# Patient Record
Sex: Female | Born: 1955 | Race: White | Hispanic: No | Marital: Married | Smoking: Never smoker
Health system: Southern US, Community
[De-identification: ages and names within clinical notes are randomized; demographics above are authoritative.]

## PROBLEM LIST (undated history)

## (undated) DIAGNOSIS — J101 Influenza due to other identified influenza virus with other respiratory manifestations: Secondary | ICD-10-CM

## (undated) DIAGNOSIS — F338 Other recurrent depressive disorders: Secondary | ICD-10-CM

## (undated) DIAGNOSIS — I10 Essential (primary) hypertension: Secondary | ICD-10-CM

## (undated) DIAGNOSIS — I82409 Acute embolism and thrombosis of unspecified deep veins of unspecified lower extremity: Secondary | ICD-10-CM

## (undated) HISTORY — PX: KNEE ARTHROSCOPY: SUR90

---

## 1978-08-06 HISTORY — PX: CYST EXCISION: SHX5701

## 1978-08-06 HISTORY — PX: TONSILLECTOMY: SUR1361

## 1998-11-18 ENCOUNTER — Ambulatory Visit (HOSPITAL_BASED_OUTPATIENT_CLINIC_OR_DEPARTMENT_OTHER): Admission: RE | Admit: 1998-11-18 | Discharge: 1998-11-18 | Payer: Self-pay | Admitting: General Surgery

## 2001-09-30 ENCOUNTER — Other Ambulatory Visit: Admission: RE | Admit: 2001-09-30 | Discharge: 2001-09-30 | Payer: Self-pay | Admitting: Obstetrics and Gynecology

## 2001-10-13 ENCOUNTER — Encounter: Admission: RE | Admit: 2001-10-13 | Discharge: 2001-10-13 | Payer: Self-pay | Admitting: Obstetrics and Gynecology

## 2001-10-13 ENCOUNTER — Encounter: Payer: Self-pay | Admitting: Obstetrics and Gynecology

## 2003-09-22 ENCOUNTER — Other Ambulatory Visit: Admission: RE | Admit: 2003-09-22 | Discharge: 2003-09-22 | Payer: Self-pay | Admitting: Obstetrics and Gynecology

## 2006-10-11 ENCOUNTER — Other Ambulatory Visit: Admission: RE | Admit: 2006-10-11 | Discharge: 2006-10-11 | Payer: Self-pay | Admitting: Obstetrics and Gynecology

## 2007-07-16 ENCOUNTER — Ambulatory Visit (HOSPITAL_BASED_OUTPATIENT_CLINIC_OR_DEPARTMENT_OTHER): Admission: RE | Admit: 2007-07-16 | Discharge: 2007-07-16 | Payer: Self-pay | Admitting: Orthopedic Surgery

## 2008-08-10 ENCOUNTER — Other Ambulatory Visit: Admission: RE | Admit: 2008-08-10 | Discharge: 2008-08-10 | Payer: Self-pay | Admitting: Obstetrics and Gynecology

## 2008-08-27 ENCOUNTER — Encounter: Admission: RE | Admit: 2008-08-27 | Discharge: 2008-08-27 | Payer: Self-pay | Admitting: Obstetrics and Gynecology

## 2008-08-31 ENCOUNTER — Encounter: Admission: RE | Admit: 2008-08-31 | Discharge: 2008-08-31 | Payer: Self-pay | Admitting: Obstetrics and Gynecology

## 2009-11-24 ENCOUNTER — Other Ambulatory Visit: Admission: RE | Admit: 2009-11-24 | Discharge: 2009-11-24 | Payer: Self-pay | Admitting: Obstetrics and Gynecology

## 2010-12-19 NOTE — Op Note (Signed)
NAMEMERRICK, FEUTZ                   ACCOUNT NO.:  [id number]   MEDICAL RECORD NO.:  [id number]          PATIENT TYPE:  AMB   LOCATION:  DSC                          FACILITY:  [facility]   PHYSICIAN:  Mila Homer. Sherlean Foot, M.D. DATE OF BIRTH:  1956-07-20   DATE OF PROCEDURE:  07/16/2007  DATE OF DISCHARGE:                               OPERATIVE REPORT   SURGEON:  Mila Homer. Sherlean Foot, M.D.   ASSISTANT:  None.   ANESTHESIA:  None.   PREOPERATIVE DIAGNOSES:  Right knee internal derangement and  osteoarthritis.   POSTOPERATIVE DIAGNOSES:  Right knee internal derangement and  osteoarthritis plus loose bodies and patellofemoral pain syndrome.   INDICATIONS FOR PROCEDURE:  Patient is a 55 year old with very  impressive mechanical symptoms and obvious internal derangement.  Informed consent was obtained.  No MRI was necessary.   DESCRIPTION OF PROCEDURE:  The patient was lain supine under general LMA  anesthesia.  Right leg was prepped and draped in the usual sterile  fashion.  Inferolateral and inferior and mid portals were created with a  #11 blade and a __________ cannula.   Diagnostic arthroscopy revealed grade 4 chondromalacia and, in fact,  eburnated bone on the lateral facet of the patella and the lateral  trochlea.  The medial facet and medial trochlea were grade 2.  I,  therefore, performed a lateral release to centralize the patella, since  I know this was the major source of her pain.  On deflection, there were  two large loose bodies, approximately 1 by 0.5 cm in size, floating  around in the joint.  This was obviously the reason for her mechanical  symptoms.  There was a third one that was adhered to the anterior horn  of the lateral meniscus and was probably pathologic as well.  This was  debrided also.  Interestingly, the medial and lateral compartments were  perfectly normal.  Maybe she had a little bit of grade 1 chondromalacia  medially.  Anterior cruciate ligament and PCL  were normal.  There was a  medial osteophyte in the notch, which I left alone; it was not  impinging.   I then lavaged and closed with 4-0 nylon sutures, dressed with Xeroform  dressing __________ .   COMPLICATIONS:  None.   DRAINS:  None.           ______________________________  Mila Homer. Sherlean Foot, M.D.    SDL/MEDQ  D:  07/16/2007  T:  07/16/2007  Job:  [id number]

## 2011-05-14 LAB — POCT HEMOGLOBIN-HEMACUE
Hemoglobin: 12.9
Operator id: 128471

## 2012-05-05 ENCOUNTER — Encounter (HOSPITAL_BASED_OUTPATIENT_CLINIC_OR_DEPARTMENT_OTHER): Payer: Self-pay | Admitting: *Deleted

## 2012-05-05 ENCOUNTER — Emergency Department (HOSPITAL_BASED_OUTPATIENT_CLINIC_OR_DEPARTMENT_OTHER)
Admission: EM | Admit: 2012-05-05 | Discharge: 2012-05-05 | Disposition: A | Discharge status: Home / Self Care | Payer: Managed Care, Other (non HMO) | Attending: Emergency Medicine | Admitting: Emergency Medicine

## 2012-05-05 DIAGNOSIS — Z882 Allergy status to sulfonamides status: Secondary | ICD-10-CM | POA: Insufficient documentation

## 2012-05-05 DIAGNOSIS — R040 Epistaxis: Secondary | ICD-10-CM

## 2012-05-05 DIAGNOSIS — I10 Essential (primary) hypertension: Secondary | ICD-10-CM

## 2012-05-05 NOTE — ED Provider Notes (Signed)
History     CSN: [id number]  Arrival date & time 05/05/12  1858   First MD Initiated Contact with Patient 05/05/12 2129      Chief Complaint  Patient presents with  . Epistaxis    (Consider location/radiation/quality/duration/timing/severity/associated sxs/prior treatment) Patient is a 55 y.o. female presenting with nosebleeds. The history is provided by the patient. No language interpreter was used.  Epistaxis  This is a new problem. The current episode started 1 to 2 hours ago. The problem occurs constantly. The problem has been resolved. The bleeding has been from the right nare. She has tried nothing for the symptoms.  Pt reports bleeding stopped here after RN had her hold pressure.    History reviewed. No pertinent past medical history.  History reviewed. No pertinent past surgical history.  No family history on file.  History  Substance Use Topics  . Smoking status: Never Smoker   . Smokeless tobacco: Not on file  . Alcohol Use: No    OB History    Grav Para Term Preterm Abortions TAB SAB Ect Mult Living                  Review of Systems  HENT: Positive for nosebleeds.   All other systems reviewed and are negative.    Allergies  Sulfa antibiotics  Home Medications  No current outpatient prescriptions on file.  BP 156/103  Pulse 66  Temp 98.3 F (36.8 C) (Oral)  Resp 20  SpO2 95%  Physical Exam  Nursing note and vitals reviewed. Constitutional: She is oriented to person, place, and time. She appears well-developed and well-nourished.  HENT:  Head: Normocephalic and atraumatic.  Right Ear: External ear normal.  Left Ear: External ear normal.  Nose: Nose normal.  Mouth/Throat: Oropharynx is clear and moist.       Right nare, no bleeding,    Eyes: EOM are normal.  Cardiovascular: Normal rate.   Pulmonary/Chest: Effort normal.  Musculoskeletal: Normal range of motion.  Neurological: She is alert and oriented to person, place, and time. She has  normal reflexes.  Skin: Skin is warm.  Psychiatric: She has a normal mood and affect.    ED Course  Procedures (including critical care time)  Labs Reviewed - No data to display No results found.   1. Bleeding nose   2. Hypertension       MDM  Pt counseled on preventing bleeding, afrin,   I advised have primary Md recheck blood pressure this week.  Hold pressure if any further bleeding        Lonia Skinner [place], [name] 05/05/12 2146  Lonia Skinner [place], [name] 05/05/12 [address]  Lonia Skinner [place], [name] 05/05/12 2148

## 2012-05-05 NOTE — ED Provider Notes (Signed)
Medical screening examination/treatment/procedure(s) were performed by non-physician practitioner and as supervising physician I was immediately available for consultation/collaboration.  Ethelda Chick, MD 05/05/12 2150

## 2012-05-05 NOTE — ED Notes (Signed)
Nose bleed 45 minutes ago. Pressure applied across her nares at triage.

## 2012-08-05 ENCOUNTER — Encounter (HOSPITAL_COMMUNITY): Payer: Self-pay | Admitting: Emergency Medicine

## 2012-08-05 ENCOUNTER — Emergency Department (HOSPITAL_COMMUNITY): Payer: Managed Care, Other (non HMO)

## 2012-08-05 ENCOUNTER — Inpatient Hospital Stay (HOSPITAL_COMMUNITY)
Admission: EM | Admit: 2012-08-05 | Discharge: 2012-08-11 | DRG: 208 | Disposition: A | Discharge status: Other Acute Inpatient Hospital | Payer: Managed Care, Other (non HMO) | Attending: Pulmonary Disease | Admitting: Pulmonary Disease

## 2012-08-05 DIAGNOSIS — A419 Sepsis, unspecified organism: Secondary | ICD-10-CM | POA: Diagnosis present

## 2012-08-05 DIAGNOSIS — B3749 Other urogenital candidiasis: Secondary | ICD-10-CM | POA: Diagnosis present

## 2012-08-05 DIAGNOSIS — Z79899 Other long term (current) drug therapy: Secondary | ICD-10-CM

## 2012-08-05 DIAGNOSIS — J111 Influenza due to unidentified influenza virus with other respiratory manifestations: Secondary | ICD-10-CM

## 2012-08-05 DIAGNOSIS — J9601 Acute respiratory failure with hypoxia: Secondary | ICD-10-CM

## 2012-08-05 DIAGNOSIS — N179 Acute kidney failure, unspecified: Secondary | ICD-10-CM

## 2012-08-05 DIAGNOSIS — D72819 Decreased white blood cell count, unspecified: Secondary | ICD-10-CM | POA: Diagnosis present

## 2012-08-05 DIAGNOSIS — I10 Essential (primary) hypertension: Secondary | ICD-10-CM

## 2012-08-05 DIAGNOSIS — J189 Pneumonia, unspecified organism: Principal | ICD-10-CM

## 2012-08-05 DIAGNOSIS — J101 Influenza due to other identified influenza virus with other respiratory manifestations: Secondary | ICD-10-CM | POA: Diagnosis present

## 2012-08-05 DIAGNOSIS — J9600 Acute respiratory failure, unspecified whether with hypoxia or hypercapnia: Secondary | ICD-10-CM

## 2012-08-05 DIAGNOSIS — R7989 Other specified abnormal findings of blood chemistry: Secondary | ICD-10-CM | POA: Diagnosis present

## 2012-08-05 DIAGNOSIS — R6520 Severe sepsis without septic shock: Secondary | ICD-10-CM | POA: Diagnosis present

## 2012-08-05 DIAGNOSIS — R7309 Other abnormal glucose: Secondary | ICD-10-CM | POA: Diagnosis present

## 2012-08-05 HISTORY — DX: Essential (primary) hypertension: I10

## 2012-08-05 LAB — CBC WITH DIFFERENTIAL/PLATELET
Basophils Absolute: 0 10*3/uL (ref 0.0–0.1)
Basophils Relative: 1 % (ref 0–1)
Basophils Relative: 0 % (ref 0–1)
Eosinophils Absolute: 0 10*3/uL (ref 0.0–0.7)
Eosinophils Absolute: 0 10*3/uL (ref 0.0–0.7)
Eosinophils Relative: 0 % (ref 0–5)
HCT: 40.5 % (ref 36.0–46.0)
Hemoglobin: 14.1 g/dL (ref 12.0–15.0)
Lymphocytes Relative: 11 % — ABNORMAL LOW (ref 12–46)
Lymphs Abs: 0.4 10*3/uL — ABNORMAL LOW (ref 0.7–4.0)
Lymphs Abs: 0.3 10*3/uL — ABNORMAL LOW (ref 0.7–4.0)
MCH: 28.4 pg (ref 26.0–34.0)
MCH: 27.5 pg (ref 26.0–34.0)
MCHC: 34.8 g/dL (ref 30.0–36.0)
MCV: 81.7 fL (ref 78.0–100.0)
Monocytes Absolute: 0.1 10*3/uL (ref 0.1–1.0)
Monocytes Relative: 3 % (ref 3–12)
Neutro Abs: 2.8 10*3/uL (ref 1.7–7.7)
Neutro Abs: 2.8 10*3/uL (ref 1.7–7.7)
Neutrophils Relative %: 85 % — ABNORMAL HIGH (ref 43–77)
Neutrophils Relative %: 88 % — ABNORMAL HIGH (ref 43–77)
Platelets: 227 10*3/uL (ref 150–400)
Platelets: 212 10*3/uL (ref 150–400)
RBC: 4.96 MIL/uL (ref 3.87–5.11)
RBC: 4.54 MIL/uL (ref 3.87–5.11)
RDW: 13.9 % (ref 11.5–15.5)
WBC Morphology: INCREASED
WBC: 3.3 10*3/uL — ABNORMAL LOW (ref 4.0–10.5)

## 2012-08-05 LAB — COMPREHENSIVE METABOLIC PANEL
ALT: 90 U/L — ABNORMAL HIGH (ref 0–35)
AST: 206 U/L — ABNORMAL HIGH (ref 0–37)
Albumin: 2.6 g/dL — ABNORMAL LOW (ref 3.5–5.2)
Albumin: 2.5 g/dL — ABNORMAL LOW (ref 3.5–5.2)
Alkaline Phosphatase: 134 U/L — ABNORMAL HIGH (ref 39–117)
Alkaline Phosphatase: 130 U/L — ABNORMAL HIGH (ref 39–117)
BUN: 44 mg/dL — ABNORMAL HIGH (ref 6–23)
BUN: 45 mg/dL — ABNORMAL HIGH (ref 6–23)
CO2: 20 mEq/L (ref 19–32)
CO2: 20 mEq/L (ref 19–32)
Calcium: 8.5 mg/dL (ref 8.4–10.5)
Chloride: 100 mEq/L (ref 96–112)
Chloride: 101 mEq/L (ref 96–112)
Creatinine, Ser: 2.04 mg/dL — ABNORMAL HIGH (ref 0.50–1.10)
Creatinine, Ser: 2.3 mg/dL — ABNORMAL HIGH (ref 0.50–1.10)
GFR calc Af Amer: 30 mL/min — ABNORMAL LOW (ref >90)
GFR calc Af Amer: 26 mL/min — ABNORMAL LOW (ref >90)
GFR calc non Af Amer: 26 mL/min — ABNORMAL LOW (ref >90)
GFR calc non Af Amer: 23 mL/min — ABNORMAL LOW (ref >90)
Glucose, Bld: 130 mg/dL — ABNORMAL HIGH (ref 70–99)
Glucose, Bld: 169 mg/dL — ABNORMAL HIGH (ref 70–99)
Potassium: 4.4 mEq/L (ref 3.5–5.1)
Potassium: 4.4 mEq/L (ref 3.5–5.1)
Sodium: 136 mEq/L (ref 135–145)
Total Bilirubin: 0.2 mg/dL — ABNORMAL LOW (ref 0.3–1.2)
Total Bilirubin: 0.2 mg/dL — ABNORMAL LOW (ref 0.3–1.2)
Total Protein: 6.6 g/dL (ref 6.0–8.3)

## 2012-08-05 LAB — PROTIME-INR
INR: 0.93 (ref 0.00–1.49)
Prothrombin Time: 12.4 seconds (ref 11.6–15.2)

## 2012-08-05 LAB — POCT I-STAT 3, ART BLOOD GAS (G3+)
Acid-base deficit: 3 mmol/L — ABNORMAL HIGH (ref 0.0–2.0)
Bicarbonate: 22 mEq/L (ref 20.0–24.0)
O2 Saturation: 91 %
Patient temperature: 102.6
TCO2: 23 mmol/L (ref 0–100)
pCO2 arterial: 42.6 mmHg (ref 35.0–45.0)
pH, Arterial: 7.331 — ABNORMAL LOW (ref 7.350–7.450)
pO2, Arterial: 73 mmHg — ABNORMAL LOW (ref 80.0–100.0)

## 2012-08-05 LAB — PHOSPHORUS: Phosphorus: 3.6 mg/dL (ref 2.3–4.6)

## 2012-08-05 LAB — MAGNESIUM: Magnesium: 1.9 mg/dL (ref 1.5–2.5)

## 2012-08-05 LAB — APTT: aPTT: 30 seconds (ref 24–37)

## 2012-08-05 LAB — PROCALCITONIN: Procalcitonin: 0.51 ng/mL

## 2012-08-05 LAB — PRO B NATRIURETIC PEPTIDE: Pro B Natriuretic peptide (BNP): 59.2 pg/mL (ref 0–125)

## 2012-08-05 MED ORDER — SODIUM CHLORIDE 0.9 % IV SOLN
250.0000 mL | INTRAVENOUS | Status: DC | PRN
  Administered 2012-08-06: 250 mL via INTRAVENOUS

## 2012-08-05 MED ORDER — OSELTAMIVIR PHOSPHATE 75 MG PO CAPS: 75.0000 mg | ORAL_CAPSULE | Freq: Once | ORAL | Status: DC

## 2012-08-05 MED ORDER — CEFTRIAXONE SODIUM 1 G IJ SOLR: 1.0000 g | Freq: Once | INTRAMUSCULAR | Status: DC

## 2012-08-05 MED ORDER — SODIUM CHLORIDE 0.9 % IV SOLN: Freq: Once | INTRAVENOUS | Status: DC

## 2012-08-05 MED ORDER — OSELTAMIVIR PHOSPHATE 75 MG PO CAPS
75.0000 mg | ORAL_CAPSULE | Freq: Two times a day (BID) | ORAL | Status: DC
  Administered 2012-08-06: 75 mg via ORAL
  Filled 2012-08-05: qty 1

## 2012-08-05 MED ORDER — SODIUM CHLORIDE 0.9 % IV BOLUS (SEPSIS)
1000.0000 mL | Freq: Once | INTRAVENOUS | Status: AC
  Administered 2012-08-05: 1000 mL via INTRAVENOUS

## 2012-08-05 MED ORDER — DEXTROSE 5 % IV SOLN: 500.0000 mg | INTRAVENOUS | Status: DC

## 2012-08-05 MED ORDER — DEXTROSE 5 % IV SOLN
1.0000 g | Freq: Once | INTRAVENOUS | Status: AC
  Administered 2012-08-06: 1 g via INTRAVENOUS
  Filled 2012-08-05: qty 10

## 2012-08-05 MED ORDER — SODIUM CHLORIDE 0.9 % IV SOLN: INTRAVENOUS | Status: DC

## 2012-08-05 MED ORDER — DEXTROSE 5 % IV SOLN: 1.0000 g | Freq: Once | INTRAVENOUS | Status: AC

## 2012-08-05 MED ORDER — ALBUTEROL SULFATE (5 MG/ML) 0.5% IN NEBU
2.5000 mg | INHALATION_SOLUTION | RESPIRATORY_TRACT | Status: DC
  Administered 2012-08-06: 2.5 mg via RESPIRATORY_TRACT
  Filled 2012-08-05: qty 0.5

## 2012-08-05 MED ORDER — DEXTROSE 5 % IV SOLN
2.0000 g | INTRAVENOUS | Status: DC
  Administered 2012-08-06 – 2012-08-09 (×4): 2 g via INTRAVENOUS
  Filled 2012-08-05 (×5): qty 2

## 2012-08-05 MED ORDER — DEXTROSE 5 % IV SOLN
500.0000 mg | Freq: Once | INTRAVENOUS | Status: AC
  Administered 2012-08-05: 500 mg via INTRAVENOUS
  Filled 2012-08-05: qty 500

## 2012-08-05 MED ORDER — HEPARIN SODIUM (PORCINE) 5000 UNIT/ML IJ SOLN
5000.0000 [IU] | Freq: Three times a day (TID) | INTRAMUSCULAR | Status: DC
  Administered 2012-08-06 – 2012-08-11 (×17): 5000 [IU] via SUBCUTANEOUS
  Filled 2012-08-05 (×19): qty 1

## 2012-08-05 MED ORDER — IPRATROPIUM BROMIDE 0.02 % IN SOLN
0.5000 mg | RESPIRATORY_TRACT | Status: DC
  Administered 2012-08-06: 0.5 mg via RESPIRATORY_TRACT
  Filled 2012-08-05: qty 2.5

## 2012-08-05 MED ORDER — PANTOPRAZOLE SODIUM 40 MG PO TBEC
40.0000 mg | DELAYED_RELEASE_TABLET | Freq: Every day | ORAL | Status: DC
  Administered 2012-08-06: 40 mg via ORAL
  Filled 2012-08-05: qty 1

## 2012-08-05 MED ORDER — ONDANSETRON HCL 4 MG/2ML IJ SOLN
4.0000 mg | Freq: Once | INTRAMUSCULAR | Status: AC
  Administered 2012-08-06: 4 mg via INTRAVENOUS
  Filled 2012-08-05 (×2): qty 2

## 2012-08-05 NOTE — ED Notes (Signed)
Monitor applied  O2 sats on RA 52-54%  4 liters 68%

## 2012-08-05 NOTE — ED Notes (Signed)
Pt c/o generalized body aches with nausea, vomiting and diarrhea.  St's took Tamiflu without relief.  EMS gave pt Tylenol 1gram and Zofran.

## 2012-08-05 NOTE — ED Notes (Signed)
Pt's sat's 77 on 5LPM [place].  Pt placed on NRB at 15Liters.  Blood cultures being drawn at this time.

## 2012-08-05 NOTE — ED Provider Notes (Signed)
History     CSN: [id number]  Arrival date & time 08/05/12  1944   None     Chief Complaint  Patient presents with  . Generalized Body Aches     HPI chief complaint: Flulike symptoms. Onset: 9 days ago. Location: Home. Symptoms not improved or worsened by anything. Severity: Moderate. Timing: Constant. Context: Patient's nephew was recently influenza positive. She has associated fevers, myalgias, rare vomiting, no diarrhea, no headaches. Patient also has mild shortness of breath and a nonproductive cough. Regarding social history see nurse's notes. Positive family history for influenza and patient's nephew. I have reviewed patient's past medical, past surgical, social history as well as medications and allergies.  No past medical history on file.  No past surgical history on file.  No family history on file.  History  Substance Use Topics  . Smoking status: Never Smoker   . Smokeless tobacco: Not on file  . Alcohol Use: No    OB History    Grav Para Term Preterm Abortions TAB SAB Ect Mult Living                  Review of Systems 10 Systems reviewed and are negative for acute change except as noted in the HPI.  Allergies  Sulfa antibiotics  Home Medications  No current outpatient prescriptions on file.  There were no vitals taken for this visit.  Physical Exam  Constitutional: She is oriented to person, place, and time. She appears well-developed and well-nourished. No distress.  HENT:  Head: Normocephalic and atraumatic.  Mouth/Throat: Oropharynx is clear and moist.  Eyes: Conjunctivae normal are normal. Right eye exhibits no discharge. Left eye exhibits no discharge. No scleral icterus.  Neck: Normal range of motion. Neck supple. No JVD present.  Cardiovascular: Normal rate, regular rhythm, normal heart sounds and intact distal pulses.   No murmur heard. Pulmonary/Chest: Effort normal and breath sounds normal. No respiratory distress. She has no wheezes. She  has no rales. She exhibits no tenderness.       Mild tachypnea.   Abdominal: Soft. Bowel sounds are normal. She exhibits no distension and no mass. There is no tenderness. There is no rebound and no guarding.  Musculoskeletal: Normal range of motion. She exhibits no edema and no tenderness.       Generalized muscle tenderness.   Neurological: She is alert and oriented to person, place, and time.  Skin: Skin is warm and dry. She is not diaphoretic.  Psychiatric: She has a normal mood and affect.    ED Course  Procedures (including critical care time)  Labs Reviewed  COMPREHENSIVE METABOLIC PANEL - Abnormal; Notable for the following:    Glucose, Bld 130 (*)     BUN 44 (*)     Creatinine, Ser 2.04 (*)     Albumin 2.6 (*)     AST 206 (*)     ALT 90 (*)     Alkaline Phosphatase 134 (*)     Total Bilirubin 0.2 (*)     GFR calc non Af Amer 26 (*)     GFR calc Af Amer 30 (*)     All other components within normal limits  CBC WITH DIFFERENTIAL - Abnormal; Notable for the following:    WBC 3.3 (*)     Neutrophils Relative 85 (*)     Lymphocytes Relative 11 (*)     Lymphs Abs 0.4 (*)     All other components within normal limits  POCT  I-STAT 3, BLOOD GAS (G3+) - Abnormal; Notable for the following:    pH, Arterial 7.331 (*)     pO2, Arterial 73.0 (*)     Acid-base deficit 3.0 (*)     All other components within normal limits  COMPREHENSIVE METABOLIC PANEL - Abnormal; Notable for the following:    Glucose, Bld 169 (*)     BUN 45 (*)     Creatinine, Ser 2.30 (*)     Calcium 8.3 (*)     Albumin 2.5 (*)     AST 207 (*)     ALT 88 (*)     Alkaline Phosphatase 130 (*)     Total Bilirubin 0.2 (*)     GFR calc non Af Amer 23 (*)     GFR calc Af Amer 26 (*)     All other components within normal limits  LIPASE, BLOOD - Abnormal; Notable for the following:    Lipase 90 (*)     All other components within normal limits  CBC WITH DIFFERENTIAL - Abnormal; Notable for the following:     WBC 3.2 (*)     Neutrophils Relative 88 (*)     Lymphocytes Relative 9 (*)     Lymphs Abs 0.3 (*)     Monocytes Relative 2 (*)     All other components within normal limits  MAGNESIUM  PHOSPHORUS  TROPONIN I  LACTIC ACID, PLASMA  PROCALCITONIN  PRO B NATRIURETIC PEPTIDE  PROTIME-INR  APTT  URINALYSIS, ROUTINE W REFLEX MICROSCOPIC  BLOOD GAS, ARTERIAL  INFLUENZA PANEL BY PCR  CULTURE, BLOOD (ROUTINE X 2)  CULTURE, BLOOD (ROUTINE X 2)  LEGIONELLA ANTIGEN, URINE  STREP PNEUMONIAE URINARY ANTIGEN  TROPONIN I  TROPONIN I  CORTISOL  URINE CULTURE  CBC  BASIC METABOLIC PANEL  BLOOD GAS, ARTERIAL  MAGNESIUM  PHOSPHORUS   Dg Chest 2 View  08/05/2012  *RADIOLOGY REPORT*  Clinical Data: Fever, cough and weakness.  CHEST - 2 VIEW  Comparison: None.  Findings: The lungs are well expanded.  Diffuse bilateral airspace opacification is noted, with lobar consolidation involving the right upper lobe, compatible with extensive diffuse pneumonia.  No pleural effusion or pneumothorax is seen.  The heart is normal in size; the mediastinal contour is within normal limits.  No acute osseous abnormalities are seen.  IMPRESSION: Diffuse bilateral pneumonia noted, with lobar consolidation involving the right upper lobe.   Original Report Authenticated By: Tonia Ghent, M.D.      No diagnosis found.   Initial EKG reviewed and interpreted. Sinus tachycardia rate 101. Normal axis. Normal intervals. No T wave inversions. No ST segment changes MDM  Patient is a 56 year old female presenting with 9 days of flulike symptoms and already finished a course of Tamiflu presents with shortness of breath, cough, fevers. Chest x-ray shows an obvious bilateral pneumonia. Patient did have a room air oxygen saturation 55% with a good waveform. Patient was mildly tachypneic rate 30 but speaking in full sentences. With a nonrebreather patient's oxygen saturation improved to 94%. Arterial blood gas does not support need  for intubation at this time. Critical care team consulted and patient admitted to the intensive care unit for sepsis secondary to pneumonia with associated acute renal failure and new oxygen requirement. Provided 2 g Rocephin and azithromycin. Patient was so given Tamiflu by request of the admitting team. Patient started on BiPAP prior to transfer. Patient remained hemodynamically stable prior to transfer.        Lakayla Barrington  Carrolyn Meiers, MD 08/06/12 0021

## 2012-08-05 NOTE — H&P (Signed)
PULMONARY  / CRITICAL CARE MEDICINE  Name: [name] MRN: [id number] DOB: 10-31-1955    LOS: 0  REFERRING MD :  March Rummage EDP  CHIEF COMPLAINT:  hypoxemic respiratory failure   BRIEF PATIENT DESCRIPTION: 56 y/o female with Hypertension presented to the [facility] ED on 12/31 with hypoxemic respiratory failure presumably from H1N1 pneumonia.  LINES / TUBES: 12/31 BIPAP >>  CULTURES: 12/31 blood >> 12/31 influenza by pcr >> 12/31 urine >> 12/31 urine st >> 12/31 urine leg >>  ANTIBIOTICS: 12/31 ceftriaxone >> 12/31 azithro x1  12/31 oseltamivir >> 1/1 vanc >> 1/1 levaquin >>  SIGNIFICANT EVENTS:    LEVEL OF CARE:  ICU PRIMARY SERVICE:  PCCM CONSULTANTS:   CODE STATUS: Full DIET:  npo DVT Px:  Sub q hep GI Px:  ppi  HISTORY OF PRESENT ILLNESS:  56 y/o female with Hypertension presented to the [facility] ED on 12/31 with hypoxemic respiratory failure presumably from H1N1 pneumonia. She has noted fevers, chills, nausea, poor po intake and a dry cough.  Her grandson was diagnosed with influenza on 12/27 and she has been sick since then.  She was treated with 5 days of tamiflu prior to admission, but she was not tested for influenza.  Her husband brought her to the ED on 12/31 because of worsening shortness of breath.  In the ED she was started on BIPAP.    PAST MEDICAL HISTORY :  History reviewed. No pertinent past medical history. No past surgical history on file. Prior to Admission medications   Medication Sig Start Date End Date Taking? Authorizing Provider  albuterol (PROVENTIL HFA;VENTOLIN HFA) 108 (90 BASE) MCG/ACT inhaler Inhale 2 puffs into the lungs every 6 (six) hours as needed. For shortness of breath   Yes Historical Provider, MD  ibuprofen (ADVIL,MOTRIN) 400 MG tablet Take 400-800 mg by mouth every 8 (eight) hours as needed. For fever/pain   Yes Historical Provider, MD  lisinopril (PRINIVIL,ZESTRIL) 10 MG tablet Take 10 mg by mouth daily.   Yes Historical Provider, MD    medroxyPROGESTERone (PROVERA) 10 MG tablet Take 10 mg by mouth daily.   Yes Historical Provider, MD   Allergies  Allergen Reactions  . Aspirin Nausea And Vomiting  . Sulfa Antibiotics     nausea    FAMILY HISTORY:  No family history on file. SOCIAL HISTORY:  reports that she has never smoked. She does not have any smokeless tobacco history on file. She reports that she does not drink alcohol or use illicit drugs.  REVIEW OF SYSTEMS:   Gen: + fever, + chills, weight change, + fatigue, night sweats HEENT: Denies blurred vision, double vision, hearing loss, tinnitus, sinus congestion, rhinorrhea, sore throat, neck stiffness, dysphagia PULM: per hpi CV: Denies chest pain, edema, orthopnea, paroxysmal nocturnal dyspnea, palpitations GI: Denies abdominal pain, + nausea, + vomiting, diarrhea, hematochezia, melena, constipation, change in bowel habits GU: Denies dysuria, hematuria, polyuria, oliguria, urethral discharge Endocrine: Denies hot or cold intolerance, polyuria, polyphagia or appetite change Derm: Denies rash, dry skin, scaling or peeling skin change Heme: Denies easy bruising, bleeding, bleeding gums Neuro: Denies headache, numbness, weakness, slurred speech, loss of memory or consciousness    INTERVAL HISTORY:   VITAL SIGNS: Temp:  [97.8 F (36.6 C)-102.6 F (39.2 C)] 97.8 F (36.6 C) (12/31 2245) Pulse Rate:  [95] 95  (12/31 2245) Resp:  [20] 20  (12/31 2003) BP: (97-111)/(62-65) 97/65 mmHg (12/31 2245) HEMODYNAMICS:   VENTILATOR SETTINGS:   INTAKE / OUTPUT: Intake/Output  None     PHYSICAL EXAMINATION: Gen: resting comfortably on 100% NRB HEENT: NCAT, PERRL, EOMi, OP clear, PULM: Insp crackles and rhonchi bilaterally CV: RRR, no mgr, no JVD AB: BS+, soft, nontender, no hsm Ext: warm, no edema, no clubbing, no cyanosis Derm: no rash or skin breakdown Neuro: A&Ox4, CN II-XII intact, strength 5/5 in all 4 extremities    LABS: Cbc  Lab 08/05/12 2223  08/05/12 2013  WBC 3.2* --  HGB 12.5 14.1  HCT 37.2 40.5  PLT 212 227    Chemistry   Lab 08/05/12 2223 08/05/12 2013  NA 136 136  K 4.4 4.4  CL 101 100  CO2 20 20  BUN 45* 44*  CREATININE 2.30* 2.04*  CALCIUM 8.3* 8.5  MG 1.9 --  PHOS 3.6 --  GLUCOSE 169* 130*    Liver fxn  Lab 08/05/12 2223 08/05/12 2013  AST 207* 206*  ALT 88* 90*  ALKPHOS 130* 134*  BILITOT 0.2* 0.2*  PROT 6.2 6.6  ALBUMIN 2.5* 2.6*   coags  Lab 08/05/12 2223  APTT 30  INR 0.93   Sepsis markers  Lab 08/05/12 2224  LATICACIDVEN 1.2  PROCALCITON 0.51   Cardiac markers No results found for this basename: CKTOTAL:3,CKMB:3,TROPONINI:3 in the last 168 hours BNP No results found for this basename: PROBNP:3 in the last 168 hours ABG  Lab 08/05/12 2152  PHART 7.331*  PCO2ART 42.6  PO2ART 73.0*  HCO3 22.0  TCO2 23    CBG trend No results found for this basename: GLUCAP:5 in the last 168 hours  IMAGING: 12/31 CXR diffuse bilateral airspace disease; consolidation RUL no effusion  ECG: pending  DIAGNOSES: Principal Problem:  *Acute respiratory failure with hypoxia Active Problems:  Influenza-like illness  CAP (community acquired pneumonia)  Malignant hypertension  AKI (acute kidney injury)   ASSESSMENT / PLAN:  PULMONARY  ASSESSMENT: 1) Acute respiratory failure presumably due to influenza pneumonia with bacterial superinfection; currently resting comfortably on BIPAP but profoundly hypoxemic.  High risk for intubation.  Perhaps some component of volume overload given elevated Cr but cardiac work up negative and proBNP very low.   PLAN:   -IV antibiotics and tamiflu, see ID -Continue BIPAP for now, but monitor O2 saturation and resp condition closely in ICU -repeat ABG in AM -KVO fluids -lasix x1 -prn bronchodilators -Currently comfortable on BIPAP and prefers not to be intubated now.  However if no improvement in oxygenation within 12-24 hours with antibiotics,  antivirals and lasix, would proceed with intubation as BIPAP not ideal management of pneumonia.  CARDIOVASCULAR  ASSESSMENT:  1) Hypertension  PLAN:  -hold home meds for now -tele -EKG  RENAL  ASSESSMENT:   1) AKI likely due to poor po intake; has received over 1 liter of NS and currently HD stable; would prefer to hold further IVF for now given profound hypoxemia  PLAN:   -hold further IVF given profound hypoxemia -lasix x1 -follow UOP closely -urine cr/na/urea for AKI work up  GASTROINTESTINAL  ASSESSMENT:   1) moderately elevated LFT's, uncertain etiology, viral? PLAN:   -repeat LFT's  -acute hepatitis panel  HEMATOLOGIC  ASSESSMENT:   1) no acute issues PLAN:  -monitor for bleeding  INFECTIOUS  ASSESSMENT:   1) Influenza pneumonia  2) Severe CAP: consider MRSA in addition to usual pathogens given influenza PLAN:   -vanc, ceftriaxone, levaquin -oseltamivir -f/u cultures and biomarkers  ENDOCRINE  ASSESSMENT:   1) No acute issues PLAN:   -monitor glucose  NEUROLOGIC  ASSESSMENT:   1) No acute issues PLAN:   -monitor neuro status  CLINICAL SUMMARY: 56 y/o female with acute hypoxemic respiratory failure presumably from viral pneumonia vs severe CAP.  Resting comfortably on BIPAP but high risk for intubation.  I have personally obtained a history, examined the patient, evaluated laboratory and imaging results, formulated the assessment and plan and placed orders. CRITICAL CARE: The patient is critically ill with multiple organ systems failure and requires high complexity decision making for assessment and support, frequent evaluation and titration of therapies, application of advanced monitoring technologies and extensive interpretation of multiple databases. Critical Care Time devoted to patient care services described in this note is 60 minutes.   Max Fickle, MD Pulmonary and Critical Care Medicine [facility] Pager: [phone]  08/05/2012, 11:44 PM

## 2012-08-05 NOTE — Progress Notes (Signed)
ABG obtained and results given to MD, pt on NRB. Arterial Blood Gas result:  pO2 62; pCO2 38.7; pH 7.36;  HCO3 22, %O2 Sat 91%.

## 2012-08-05 NOTE — ED Notes (Signed)
Pt placed on BiPap.  Husband remains at bedside.

## 2012-08-06 ENCOUNTER — Encounter (HOSPITAL_COMMUNITY): Payer: Self-pay | Admitting: *Deleted

## 2012-08-06 ENCOUNTER — Inpatient Hospital Stay (HOSPITAL_COMMUNITY): Payer: Managed Care, Other (non HMO)

## 2012-08-06 DIAGNOSIS — J189 Pneumonia, unspecified organism: Secondary | ICD-10-CM | POA: Diagnosis present

## 2012-08-06 DIAGNOSIS — J9601 Acute respiratory failure with hypoxia: Secondary | ICD-10-CM | POA: Diagnosis present

## 2012-08-06 DIAGNOSIS — I10 Essential (primary) hypertension: Secondary | ICD-10-CM | POA: Diagnosis present

## 2012-08-06 DIAGNOSIS — N179 Acute kidney failure, unspecified: Secondary | ICD-10-CM | POA: Diagnosis present

## 2012-08-06 LAB — BLOOD GAS, ARTERIAL
Acid-base deficit: 1.1 mmol/L (ref 0.0–2.0)
Expiratory PAP: 7
Inspiratory PAP: 14
Mode: POSITIVE
pCO2 arterial: 39.9 mmHg (ref 35.0–45.0)
pH, Arterial: 7.384 (ref 7.350–7.450)

## 2012-08-06 LAB — TROPONIN I: Troponin I: <0.3 ng/mL (ref <0.30)

## 2012-08-06 LAB — PHOSPHORUS: Phosphorus: 3.5 mg/dL (ref 2.3–4.6)

## 2012-08-06 LAB — LEGIONELLA ANTIGEN, URINE: Legionella Antigen, Urine: NEGATIVE

## 2012-08-06 LAB — BASIC METABOLIC PANEL
BUN: 49 mg/dL — ABNORMAL HIGH (ref 6–23)
CO2: 26 mEq/L (ref 19–32)
Chloride: 103 mEq/L (ref 96–112)
Glucose, Bld: 139 mg/dL — ABNORMAL HIGH (ref 70–99)
Potassium: 4.5 mEq/L (ref 3.5–5.1)

## 2012-08-06 LAB — URINALYSIS, ROUTINE W REFLEX MICROSCOPIC
Bilirubin Urine: NEGATIVE
Glucose, UA: NEGATIVE mg/dL
Ketones, ur: NEGATIVE mg/dL
pH: 5 (ref 5.0–8.0)

## 2012-08-06 LAB — MAGNESIUM: Magnesium: 2 mg/dL (ref 1.5–2.5)

## 2012-08-06 LAB — HEPATIC FUNCTION PANEL
Bilirubin, Direct: <0.1 mg/dL (ref 0.0–0.3)
Total Protein: 6.2 g/dL (ref 6.0–8.3)

## 2012-08-06 LAB — INFLUENZA PANEL BY PCR (TYPE A & B): H1N1 flu by pcr: DETECTED — AB

## 2012-08-06 LAB — CORTISOL: Cortisol, Plasma: 29.4 ug/dL

## 2012-08-06 LAB — CBC
HCT: 36.2 % (ref 36.0–46.0)
Hemoglobin: 11.9 g/dL — ABNORMAL LOW (ref 12.0–15.0)
WBC: 2.8 10*3/uL — ABNORMAL LOW (ref 4.0–10.5)

## 2012-08-06 LAB — SODIUM, URINE, RANDOM: Sodium, Ur: 84 mEq/L

## 2012-08-06 LAB — CREATININE, URINE, RANDOM: Creatinine, Urine: 56.19 mg/dL

## 2012-08-06 MED ORDER — DEXTROSE-NACL 5-0.45 % IV SOLN
INTRAVENOUS | Status: DC
  Administered 2012-08-06 – 2012-08-07 (×2): via INTRAVENOUS

## 2012-08-06 MED ORDER — OSELTAMIVIR PHOSPHATE 75 MG PO CAPS
150.0000 mg | ORAL_CAPSULE | Freq: Two times a day (BID) | ORAL | Status: AC
  Administered 2012-08-06 – 2012-08-10 (×6): 150 mg via ORAL
  Filled 2012-08-06 (×9): qty 2

## 2012-08-06 MED ORDER — ONDANSETRON HCL 4 MG/2ML IJ SOLN: 4.0000 mg | Freq: Three times a day (TID) | INTRAMUSCULAR | Status: DC | PRN

## 2012-08-06 MED ORDER — FUROSEMIDE 10 MG/ML IJ SOLN
40.0000 mg | Freq: Once | INTRAMUSCULAR | Status: AC
  Administered 2012-08-06: 40 mg via INTRAVENOUS
  Filled 2012-08-06: qty 4

## 2012-08-06 MED ORDER — LORAZEPAM 2 MG/ML IJ SOLN
0.5000 mg | Freq: Once | INTRAMUSCULAR | Status: AC
  Administered 2012-08-06: 0.5 mg via INTRAVENOUS
  Filled 2012-08-06: qty 1

## 2012-08-06 MED ORDER — BIOTENE DRY MOUTH MT LIQD
15.0000 mL | Freq: Two times a day (BID) | OROMUCOSAL | Status: DC
  Administered 2012-08-06 – 2012-08-09 (×8): 15 mL via OROMUCOSAL

## 2012-08-06 MED ORDER — CHLORHEXIDINE GLUCONATE 0.12 % MT SOLN
15.0000 mL | Freq: Two times a day (BID) | OROMUCOSAL | Status: DC
  Administered 2012-08-06 – 2012-08-09 (×8): 15 mL via OROMUCOSAL
  Filled 2012-08-06 (×8): qty 15

## 2012-08-06 MED ORDER — LEVOFLOXACIN IN D5W 750 MG/150ML IV SOLN
750.0000 mg | Freq: Once | INTRAVENOUS | Status: AC
  Administered 2012-08-06: 750 mg via INTRAVENOUS
  Filled 2012-08-06: qty 150

## 2012-08-06 MED ORDER — IPRATROPIUM BROMIDE 0.02 % IN SOLN: 0.5000 mg | RESPIRATORY_TRACT | Status: DC | PRN

## 2012-08-06 MED ORDER — DEXMEDETOMIDINE HCL IN NACL 200 MCG/50ML IV SOLN
0.2000 ug/kg/h | INTRAVENOUS | Status: AC
  Administered 2012-08-06: 0.3 ug/kg/h via INTRAVENOUS
  Administered 2012-08-06: 0.2 ug/kg/h via INTRAVENOUS
  Administered 2012-08-07: 0.5 ug/kg/h via INTRAVENOUS
  Administered 2012-08-07 (×3): 0.7 ug/kg/h via INTRAVENOUS
  Filled 2012-08-06 (×6): qty 50

## 2012-08-06 MED ORDER — VANCOMYCIN HCL 1000 MG IV SOLR
750.0000 mg | Freq: Two times a day (BID) | INTRAVENOUS | Status: DC
  Administered 2012-08-07: 750 mg via INTRAVENOUS
  Filled 2012-08-06 (×3): qty 750

## 2012-08-06 MED ORDER — SODIUM CHLORIDE 0.9 % IV SOLN
2000.0000 mg | Freq: Once | INTRAVENOUS | Status: AC
  Administered 2012-08-06: 2000 mg via INTRAVENOUS
  Filled 2012-08-06: qty 2000

## 2012-08-06 MED ORDER — LEVOFLOXACIN IN D5W 750 MG/150ML IV SOLN: 750.0000 mg | INTRAVENOUS | Status: DC

## 2012-08-06 MED ORDER — ALBUTEROL SULFATE (5 MG/ML) 0.5% IN NEBU: 2.5000 mg | INHALATION_SOLUTION | RESPIRATORY_TRACT | Status: DC | PRN

## 2012-08-06 MED ORDER — SODIUM CHLORIDE 0.9 % IV SOLN
INTRAVENOUS | Status: DC
  Administered 2012-08-06 – 2012-08-09 (×3): via INTRAVENOUS

## 2012-08-06 NOTE — Progress Notes (Signed)
PULMONARY  / CRITICAL CARE MEDICINE  Name: [name] MRN: [id number] DOB: [birth year]    LOS: 1  REFERRING MD :  March Rummage EDP  CHIEF COMPLAINT:  hypoxemic respiratory failure   BRIEF PATIENT DESCRIPTION: 57 y/o female with Hypertension presented to the [facility] ED on 12/31 with hypoxemic respiratory failure presumably from H1N1 pneumonia.  LINES / TUBES: 12/31 BIPAP >>  CULTURES: 12/31 blood >> 12/31 influenza by pcr >> 12/31 urine >> 12/31 urine st >> 12/31 urine leg >>  ANTIBIOTICS: 12/31 ceftriaxone >> 12/31 azithro x1  12/31 oseltamivir >> 1/1 vanc >> 1/1 levaquin >>  SIGNIFICANT EVENTS:   DIET:  npo DVT Px:  Sub q hep GI Px:  ppi  INTERVAL HISTORY:  She feels BiPAP is helping.  Denies chest/abd pain.  VITAL SIGNS: Temp:  [97.8 F (36.6 C)-102.6 F (39.2 C)] 97.8 F (36.6 C) (12/31 2245) Pulse Rate:  [88-95] 88  (01/01 0500) Resp:  [20-46] 36  (01/01 0500) BP: (97-111)/(51-66) 110/51 mmHg (01/01 0500) SpO2:  [90 %-94 %] 92 % (01/01 0500) Weight:  [250 lb (113.399 kg)] 250 lb (113.399 kg) (01/01 0100) HEMODYNAMICS:   VENTILATOR SETTINGS:   INTAKE / OUTPUT: Intake/Output      12/31 0701 - 01/01 0700   I.V. (mL/kg) 60 (0.5)   IV Piggyback 660   Total Intake(mL/kg) 720 (6.3)   Net +720       Urine Occurrence 2 x     PHYSICAL EXAMINATION: Gen: resting comfortably on BiPAP HEENT: NCAT, PERRL, EOMi, OP clear, PULM: Insp crackles and rhonchi bilaterally CV: RRR, no mgr, no JVD AB: BS+, soft, nontender, no hsm Ext: warm, no edema, no clubbing, no cyanosis Derm: no rash or skin breakdown Neuro: A&Ox4, CN II-XII intact, strength 5/5 in all 4 extremities    LABS: Cbc  Lab 08/06/12 0445 08/05/12 2223 08/05/12 2013  WBC 2.8* -- --  HGB 11.9* 12.5 14.1  HCT 36.2 37.2 40.5  PLT 194 212 227    Chemistry   Lab 08/05/12 2223 08/05/12 2013  NA 136 136  K 4.4 4.4  CL 101 100  CO2 20 20  BUN 45* 44*  CREATININE 2.30* 2.04*  CALCIUM 8.3* 8.5  MG  1.9 --  PHOS 3.6 --  GLUCOSE 169* 130*    Liver fxn  Lab 08/05/12 2223 08/05/12 2013  AST 207* 206*  ALT 88* 90*  ALKPHOS 130* 134*  BILITOT 0.2* 0.2*  PROT 6.2 6.6  ALBUMIN 2.5* 2.6*   coags  Lab 08/05/12 2223  APTT 30  INR 0.93   Sepsis markers  Lab 08/05/12 2224  LATICACIDVEN 1.2  PROCALCITON 0.51   Cardiac markers  Lab 08/06/12 0445 08/05/12 2224  CKTOTAL -- --  CKMB -- --  TROPONINI <0.30 <0.30   BNP  Lab 08/05/12 2224  PROBNP 59.2   ABG  Lab 08/06/12 0500 08/05/12 2152  PHART 7.384 7.331*  PCO2ART 39.9 42.6  PO2ART 62.4* 73.0*  HCO3 23.3 22.0  TCO2 24.5 23    CBG trend No results found for this basename: GLUCAP:5 in the last 168 hours  IMAGING:  Dg Chest 2 View  08/05/2012  *RADIOLOGY REPORT*  Clinical Data: Fever, cough and weakness.  CHEST - 2 VIEW  Comparison: None.  Findings: The lungs are well expanded.  Diffuse bilateral airspace opacification is noted, with lobar consolidation involving the right upper lobe, compatible with extensive diffuse pneumonia.  No pleural effusion or pneumothorax is seen.  The heart is normal in  size; the mediastinal contour is within normal limits.  No acute osseous abnormalities are seen.  IMPRESSION: Diffuse bilateral pneumonia noted, with lobar consolidation involving the right upper lobe.   Original Report Authenticated By: Tonia Ghent, M.D.     ASSESSMENT / PLAN:  PULMONARY  ASSESSMENT: 1) Acute respiratory failure presumably due to influenza pneumonia with bacterial superinfection; currently resting comfortably on BIPAP but profoundly hypoxemic.  High risk for intubation.   PLAN:   -Continue BIPAP for now, but monitor O2 saturation and resp condition closely in ICU -repeat ABG in AM -prn bronchodilators -Currently comfortable on BIPAP and prefers not to be intubated now.  However if no improvement in oxygenation within 12-24 hours with antibiotics, antivirals would proceed with intubation as BIPAP not  ideal management of pneumonia.  CARDIOVASCULAR  ASSESSMENT:  1) Hypertension  PLAN:  -hold home meds for now -tele  RENAL  ASSESSMENT:   1) AKI likely due to poor po intake; has received over 1 liter of NS and currently HD stable.  PLAN:   -follow UOP closely -urine cr/na/urea for AKI work up  GASTROINTESTINAL  ASSESSMENT:   1) moderately elevated LFT's, uncertain etiology, viral? PLAN:   -repeat LFT's  -acute hepatitis panel  HEMATOLOGIC  ASSESSMENT:   1) Leukopenia PLAN:  -f/u CBC  INFECTIOUS  ASSESSMENT:   1) Influenza pneumonia  2) Severe CAP: consider MRSA in addition to usual pathogens given influenza PLAN:   -vanc, ceftriaxone, levaquin -oseltamivir -f/u cultures and biomarkers  ENDOCRINE  ASSESSMENT:   1) No acute issues PLAN:   -monitor glucose  NEUROLOGIC  ASSESSMENT:   1) No acute issues PLAN:   -monitor neuro status  CLINICAL SUMMARY: 57 y/o female with acute hypoxemic respiratory failure presumably from viral pneumonia vs severe CAP.  Resting comfortably on BIPAP but high risk for intubation.  CC time 35 minutes.  Coralyn Helling, MD [facility] Pulmonary/Critical Care 08/06/2012, 6:18 AM Pager:  [phone] After 3pm call: [phone]

## 2012-08-06 NOTE — Progress Notes (Signed)
eLink Physician-Brief Progress Note Patient Name: [name] DOB: 07-01-1956 MRN: [id number]  Date of Service  08/06/2012   HPI/Events of Note  C/O of nausea with drop in sats when patient is taken off BiPAP   eICU Interventions  Plan: Adminster zofran IV Dose tamiflu after this while patient on NRB Re-apply BiPAP after dosing   Intervention Category Minor Interventions: Routine modifications to care plan (e.g. PRN medications for pain, fever)  Dorissa Stinnette 08/06/2012, 12:31 AM

## 2012-08-06 NOTE — Significant Event (Signed)
Pt feels breathing stable on BiPAP.  She is maintaining SpO2, although on high FiO2.  She feels anxious from wearing BiPAP.  Will add precedex.  Continue BiPAP.    Updated pt and husband about current status.  Explained that she is still at very high risk for needing intubation and full mechanical ventilatory support.  Coralyn Helling, MD [facility] Pulmonary/Critical Care 08/06/2012, 2:36 PM Pager:  [phone] After 3pm call: [phone]

## 2012-08-06 NOTE — Progress Notes (Signed)
ANTIBIOTIC CONSULT NOTE - INITIAL  Pharmacy Consult for Vancomycin and Levaquin Indication: pneumonia  Allergies  Allergen Reactions  . Aspirin Nausea And Vomiting  . Sulfa Antibiotics     nausea    Patient Measurements: Height: 5\' 7"  (170.2 cm) Weight: 250 lb (113.399 kg) (per patient) IBW/kg (Calculated) : 61.6  Adjusted Body Weight: 80 kg  Vital Signs: Temp: 97.8 F (36.6 C) (12/31 2245) Temp src: Axillary (12/31 2245) BP: 97/65 mmHg (12/31 2245) Pulse Rate: 93  (01/01 0006)  Labs:  [facility] 08/05/12 2223 08/05/12 2013  WBC 3.2* 3.3*  HGB 12.5 14.1  PLT 212 227  LABCREA -- --  CREATININE 2.30* 2.04*   Estimated Creatinine Clearance: 35.9 ml/min (by C-G formula based on Cr of 2.3). No results found for this basename: VANCOTROUGH:2,VANCOPEAK:2,VANCORANDOM:2,GENTTROUGH:2,GENTPEAK:2,GENTRANDOM:2,TOBRATROUGH:2,TOBRAPEAK:2,TOBRARND:2,AMIKACINPEAK:2,AMIKACINTROU:2,AMIKACIN:2, in the last 72 hours   Microbiology: No results found for this or any previous visit (from the past 720 hour(s)).  Medical History: History reviewed. No pertinent past medical history.  Medications:  Prescriptions prior to admission  Medication Sig Dispense Refill  . albuterol (PROVENTIL HFA;VENTOLIN HFA) 108 (90 BASE) MCG/ACT inhaler Inhale 2 puffs into the lungs every 6 (six) hours as needed. For shortness of breath      . ibuprofen (ADVIL,MOTRIN) 400 MG tablet Take 400-800 mg by mouth every 8 (eight) hours as needed. For fever/pain      . lisinopril (PRINIVIL,ZESTRIL) 10 MG tablet Take 10 mg by mouth daily.      . medroxyPROGESTERone (PROVERA) 10 MG tablet Take 10 mg by mouth daily.       Assessment: 57 yo female with PNA for empiric antibiotics  Goal of Therapy:  Vancomycin trough level 15-20 mcg/ml  Plan:  Vancomycin 2 g IV now, then 750 mg IV q12h Levaquin 750 mg IV q48h  Khara Renaud, Gary Fleet 08/06/2012,1:23 AM

## 2012-08-07 ENCOUNTER — Inpatient Hospital Stay (HOSPITAL_COMMUNITY): Payer: Managed Care, Other (non HMO)

## 2012-08-07 DIAGNOSIS — I10 Essential (primary) hypertension: Secondary | ICD-10-CM

## 2012-08-07 LAB — BLOOD GAS, ARTERIAL
Acid-Base Excess: 1.4 mmol/L (ref 0.0–2.0)
Bicarbonate: 25.8 mEq/L — ABNORMAL HIGH (ref 20.0–24.0)
Delivery systems: POSITIVE
Drawn by: 24486
FIO2: 1 %
MECHVT: 490 mL
Mode: POSITIVE
Patient temperature: 98.6
RATE: 20 resp/min
TCO2: 27.2 mmol/L (ref 0–100)
pCO2 arterial: 51.4 mmHg — ABNORMAL HIGH (ref 35.0–45.0)
pH, Arterial: 7.388 (ref 7.350–7.450)
pH, Arterial: 7.322 — ABNORMAL LOW (ref 7.350–7.450)

## 2012-08-07 LAB — COMPREHENSIVE METABOLIC PANEL
Alkaline Phosphatase: 144 U/L — ABNORMAL HIGH (ref 39–117)
BUN: 37 mg/dL — ABNORMAL HIGH (ref 6–23)
CO2: 27 mEq/L (ref 19–32)
Chloride: 106 mEq/L (ref 96–112)
Creatinine, Ser: 1.35 mg/dL — ABNORMAL HIGH (ref 0.50–1.10)
GFR calc Af Amer: 50 mL/min — ABNORMAL LOW (ref >90)
GFR calc non Af Amer: 43 mL/min — ABNORMAL LOW (ref >90)
Glucose, Bld: 137 mg/dL — ABNORMAL HIGH (ref 70–99)
Potassium: 4.9 mEq/L (ref 3.5–5.1)
Total Bilirubin: 0.2 mg/dL — ABNORMAL LOW (ref 0.3–1.2)

## 2012-08-07 LAB — URINE CULTURE

## 2012-08-07 LAB — HEPATITIS PANEL, ACUTE
HCV Ab: REACTIVE — AB
Hep A IgM: NEGATIVE
Hep B C IgM: NEGATIVE

## 2012-08-07 LAB — CBC
MCV: 84.5 fL (ref 78.0–100.0)
Platelets: 205 10*3/uL (ref 150–400)
RDW: 14.3 % (ref 11.5–15.5)
WBC: 3.1 10*3/uL — ABNORMAL LOW (ref 4.0–10.5)

## 2012-08-07 MED ORDER — OXEPA PO LIQD
1000.0000 mL | ORAL | Status: DC
  Filled 2012-08-07: qty 1000

## 2012-08-07 MED ORDER — SODIUM CHLORIDE 0.9 % IV SOLN
25.0000 ug/h | INTRAVENOUS | Status: DC
  Administered 2012-08-07 (×2): 50 ug/h via INTRAVENOUS
  Administered 2012-08-08 – 2012-08-10 (×4): 150 ug/h via INTRAVENOUS
  Filled 2012-08-07 (×7): qty 50

## 2012-08-07 MED ORDER — FENTANYL CITRATE 0.05 MG/ML IJ SOLN
INTRAMUSCULAR | Status: AC
  Administered 2012-08-07: 100 ug
  Filled 2012-08-07: qty 4

## 2012-08-07 MED ORDER — MIDAZOLAM BOLUS VIA INFUSION: 1.0000 mg | INTRAVENOUS | Status: DC | PRN

## 2012-08-07 MED ORDER — ADULT MULTIVITAMIN LIQUID CH
5.0000 mL | Freq: Every day | ORAL | Status: DC
  Filled 2012-08-07: qty 5

## 2012-08-07 MED ORDER — MIDAZOLAM BOLUS VIA INFUSION
1.0000 mg | INTRAVENOUS | Status: DC | PRN
  Filled 2012-08-07: qty 2

## 2012-08-07 MED ORDER — SODIUM CHLORIDE 0.9 % IV SOLN: 1.0000 mg/h | INTRAVENOUS | Status: DC

## 2012-08-07 MED ORDER — FENTANYL BOLUS VIA INFUSION
25.0000 ug | Freq: Four times a day (QID) | INTRAVENOUS | Status: DC | PRN
  Filled 2012-08-07: qty 100

## 2012-08-07 MED ORDER — DOPAMINE-DEXTROSE 3.2-5 MG/ML-% IV SOLN
2.0000 ug/kg/min | INTRAVENOUS | Status: DC
  Administered 2012-08-07: 2 ug/kg/min via INTRAVENOUS
  Administered 2012-08-08: 5 ug/kg/min via INTRAVENOUS
  Administered 2012-08-09 – 2012-08-10 (×2): 6 ug/kg/min via INTRAVENOUS
  Filled 2012-08-07 (×3): qty 250

## 2012-08-07 MED ORDER — ETOMIDATE 2 MG/ML IV SOLN
10.0000 mg | Freq: Once | INTRAVENOUS | Status: AC
  Administered 2012-08-07: 10 mg via INTRAVENOUS

## 2012-08-07 MED ORDER — MIDAZOLAM HCL 2 MG/2ML IJ SOLN: 2.0000 mg | Freq: Once | INTRAMUSCULAR | Status: AC

## 2012-08-07 MED ORDER — FENTANYL CITRATE 0.05 MG/ML IJ SOLN: 100.0000 ug | Freq: Once | INTRAMUSCULAR | Status: AC

## 2012-08-07 MED ORDER — ROCURONIUM BROMIDE 50 MG/5ML IV SOLN
100.0000 mg | Freq: Once | INTRAVENOUS | Status: AC
  Administered 2012-08-07: 100 mg via INTRAVENOUS

## 2012-08-07 MED ORDER — SODIUM CHLORIDE 0.9 % IV SOLN
1.0000 mg/h | INTRAVENOUS | Status: DC
  Administered 2012-08-07: 2 mg/h via INTRAVENOUS
  Administered 2012-08-07: 5 mg/h via INTRAVENOUS
  Administered 2012-08-08: 6 mg/h via INTRAVENOUS
  Administered 2012-08-08: 4 mg/h via INTRAVENOUS
  Administered 2012-08-09 – 2012-08-11 (×6): 6 mg/h via INTRAVENOUS
  Filled 2012-08-07 (×11): qty 10

## 2012-08-07 MED ORDER — MIDAZOLAM HCL 2 MG/2ML IJ SOLN
INTRAMUSCULAR | Status: AC
  Administered 2012-08-07: 2 mg
  Filled 2012-08-07: qty 4

## 2012-08-07 MED ORDER — HEPARIN SODIUM (PORCINE) 5000 UNIT/ML IJ SOLN: 5000.0000 [IU] | Freq: Three times a day (TID) | INTRAMUSCULAR | Status: DC

## 2012-08-07 MED ORDER — FENTANYL BOLUS VIA INFUSION: 25.0000 ug | Freq: Four times a day (QID) | INTRAVENOUS | Status: DC | PRN

## 2012-08-07 MED ORDER — PANTOPRAZOLE SODIUM 40 MG IV SOLR: 40.0000 mg | INTRAVENOUS | Status: DC

## 2012-08-07 MED ORDER — LEVOFLOXACIN IN D5W 750 MG/150ML IV SOLN
750.0000 mg | INTRAVENOUS | Status: DC
  Administered 2012-08-07: 750 mg via INTRAVENOUS
  Filled 2012-08-07: qty 150

## 2012-08-07 MED ORDER — PRO-STAT SUGAR FREE PO LIQD
60.0000 mL | Freq: Three times a day (TID) | ORAL | Status: DC
  Filled 2012-08-07 (×2): qty 60

## 2012-08-07 MED ORDER — LORAZEPAM 2 MG/ML IJ SOLN
0.5000 mg | Freq: Four times a day (QID) | INTRAMUSCULAR | Status: DC | PRN
  Administered 2012-08-07: 0.5 mg via INTRAVENOUS
  Filled 2012-08-07: qty 1

## 2012-08-07 MED ORDER — VANCOMYCIN HCL IN DEXTROSE 1-5 GM/200ML-% IV SOLN
1000.0000 mg | Freq: Two times a day (BID) | INTRAVENOUS | Status: DC
  Administered 2012-08-07 – 2012-08-10 (×6): 1000 mg via INTRAVENOUS
  Filled 2012-08-07 (×7): qty 200

## 2012-08-07 MED ORDER — SODIUM CHLORIDE 0.9 % IV SOLN: 25.0000 ug/h | INTRAVENOUS | Status: DC

## 2012-08-07 MED ORDER — SODIUM CHLORIDE 0.9 % IV SOLN
0.5000 ug/kg/min | INTRAVENOUS | Status: DC
  Administered 2012-08-07: 3 ug/kg/min via INTRAVENOUS
  Administered 2012-08-08: 2 ug/kg/min via INTRAVENOUS
  Filled 2012-08-07 (×2): qty 20

## 2012-08-07 NOTE — Progress Notes (Signed)
INITIAL NUTRITION ASSESSMENT  DOCUMENTATION CODES Per approved criteria  -Obesity Unspecified   INTERVENTION: 1. Recommend feeding tube to be placed post-pyloric if pt to receive EN while receiving HFOV. Once tube placed and placement verified, initiate Oxepa at 15 ml/hr, advance by 10 ml/hr every 4 hours, to goal of 25 ml/hr. 60 ml Prostat via tube TID. Total TF regimen will provide: 1500 kcal, 128 grams protein, 471 ml free water. Liquid MVI daily. 2. Initiate Adult Enteral Nutrition Protocol Order Set 3. RD to continue to follow nutrition care plan  NUTRITION DIAGNOSIS: Inadequate oral intake related to inability to eat as evidenced by NPO status.   Goal: Enteral nutrition to provide 60-70% of estimated calorie needs (22-25 kcals/kg ideal body weight) and >/= 90% of estimated protein needs, based on ASPEN guidelines for permissive underfeeding in critically ill obese individuals.  Monitor:  weight trends, lab trends, I/O's, vent status and settings, TF initiation/tolerance  Reason for Assessment: MD Consult for Initiation/Management of Enteral Nutrition; Malnutrition Screening Tool  57 y.o. female  Admitting Dx: Acute respiratory failure with hypoxia  ASSESSMENT: Admitted with HTN and hypoxemic respiratory failure, presumably from H1N1. Pt with flu-like symptoms since 12/27 (6 days ago.) Pt was initially tolerating BiPAP, however required intubation this morning. RD received consult for initiation of enteral nutrition.  Per nutrition screening tool, pt admitted to poor appetite and wt loss PTA. She also noted that she lost her father recently, suspect some weight loss (unsure of amount) 2/2 to grieving and current acute illness. No family at bedside to obtain nutrition hx at this time.  Discussed plan to start EN with RN. She notes that pt is to go on the oscillator vent soon. Discussed need for post-pyloric tube placement. RN states she would discuss with MD.  Patient is  currently intubated on ventilator support.  MV: 8.5 Temp:Temp (24hrs), Avg:98.4 F (36.9 C), Min:98.4 F (36.9 C), Max:98.4 F (36.9 C)   Height: Ht Readings from Last 1 Encounters:  08/06/12 5\' 7"  (1.702 m)    Weight: Wt Readings from Last 1 Encounters:  08/06/12 250 lb (113.399 kg)    Ideal Body Weight: 135 lb/61.4 kg  % Ideal Body Weight: 185%  Wt Readings from Last 10 Encounters:  08/06/12 250 lb (113.399 kg)    Usual Body Weight: unable to obtain  % Usual Body Weight: n/a  BMI:  Body mass index is 39.16 kg/(m^2). Obese Class II  Estimated Nutritional Needs: Kcal: 1765  Permissive underfeeding kcal goal (22-25 kcals/kg ideal body weight): 1350 - 1535 kcal Protein: 122 - 135 grams Fluid: 1.8 - 2 liters daily  Skin: intact  Diet Order: NPO  EDUCATION NEEDS: -No education needs identified at this time   Intake/Output Summary (Last 24 hours) at 08/07/12 1201 Last data filed at 08/07/12 0800  Gross per 24 hour  Intake 1600.77 ml  Output    600 ml  Net 1000.77 ml    Last BM: PTA  Labs:   Lab 08/07/12 0803 08/06/12 0445 08/05/12 2223  NA 144 139 136  K 4.9 4.5 4.4  CL 106 103 101  CO2 27 26 20   BUN 37* 49* 45*  CREATININE 1.35* 2.35* 2.30*  CALCIUM 8.9 8.1* 8.3*  MG -- 2.0 1.9  PHOS -- 3.5 3.6  GLUCOSE 137* 139* 169*    CBG (last 3)  No results found for this basename: GLUCAP:3 in the last 72 hours  Scheduled Meds:   . antiseptic oral rinse  15 mL  Mouth Rinse q12n4p  . cefTRIAXone (ROCEPHIN)  IV  2 g Intravenous Q24H  . chlorhexidine  15 mL Mouth Rinse BID  . heparin  5,000 Units Subcutaneous Q8H  . levofloxacin (LEVAQUIN) IV  750 mg Intravenous Q48H  . oseltamivir  150 mg Oral BID  . pantoprazole  40 mg Oral Daily  . vancomycin  1,000 mg Intravenous Q12H    Continuous Infusions:   . sodium chloride 20 mL/hr at 08/06/12 1607  . dexmedetomidine 0.7 mcg/kg/hr (08/07/12 0909)  . dextrose 5 % and 0.45% NaCl 50 mL/hr at 08/07/12 0800    . fentaNYL infusion INTRAVENOUS    . midazolam (VERSED) infusion      Past Medical History  Diagnosis Date  . Hypertension     Past Surgical History  Procedure Date  . No past surgeries     Jarold Motto MS, RD, LDN Pager: [phone] After-hours pager: [phone]

## 2012-08-07 NOTE — Progress Notes (Addendum)
PULMONARY  / CRITICAL CARE MEDICINE  Name: [name] MRN: [id number] DOB: [birth year]    LOS: 2  REFERRING MD :  March Rummage EDP  CHIEF COMPLAINT:  hypoxemic respiratory failure   BRIEF PATIENT DESCRIPTION: 57 y/o female with Hypertension presented to the [facility] ED on 12/31 with hypoxemic respiratory failure presumably from H1N1 pneumonia.  LINES / TUBES: 12/31 BIPAP >>  CULTURES: 12/31 blood >> 12/31 influenza by pcr >>H1N1 positive 12/31 urine >> 12/31 urine st >>NTD 12/31 urine leg >>NTD  ANTIBIOTICS: 12/31 ceftriaxone >> 12/31 azithro x1  12/31 oseltamivir >> 1/1 vanc >> 1/1 levaquin >>  SIGNIFICANT EVENTS:   DIET:  TF DVT Px:  Sub q hep GI Px:  ppi  INTERVAL HISTORY:  Unable to tolerate BiPAP and reports being tired.  VITAL SIGNS: Pulse Rate:  [53-92] 53  (01/02 0802) Resp:  [24-40] 25  (01/02 0802) BP: (100-137)/(48-80) 119/76 mmHg (01/02 0802) SpO2:  [91 %-100 %] 96 % (01/02 0802) FiO2 (%):  [100 %] 100 % (01/02 0508) HEMODYNAMICS:   VENTILATOR SETTINGS: Vent Mode:  [-]  FiO2 (%):  [100 %] 100 % INTAKE / OUTPUT: Intake/Output      01/01 0701 - 01/02 0700 01/02 0701 - 01/03 0700   P.O. 20    I.V. (mL/kg) 1420.3 (12.5) 133.8 (1.2)   IV Piggyback 350    Total Intake(mL/kg) 1790.3 (15.8) 133.8 (1.2)   Urine (mL/kg/hr) 975 (0.4)    Total Output 975    Net +815.3 +133.8        PHYSICAL EXAMINATION: Gen: resting comfortably on BiPAP HEENT: NCAT, PERRL, EOMi, OP clear, PULM: Insp crackles and rhonchi bilaterally CV: RRR, no mgr, no JVD AB: BS+, soft, nontender, no hsm Ext: warm, no edema, no clubbing, no cyanosis Derm: no rash or skin breakdown Neuro: A&Ox4, CN II-XII intact, strength 5/5 in all 4 extremities  LABS: Cbc  Lab 08/07/12 0803 08/06/12 0445 08/05/12 2223  WBC 3.1* -- --  HGB 12.5 11.9* 12.5  HCT 38.2 36.2 37.2  PLT 205 194 212   Chemistry  Lab 08/06/12 0445 08/05/12 2223 08/05/12 2013  NA 139 136 136  K 4.5 4.4 4.4  CL 103 101 100    CO2 26 20 20   BUN 49* 45* 44*  CREATININE 2.35* 2.30* 2.04*  CALCIUM 8.1* 8.3* 8.5  MG 2.0 1.9 --  PHOS 3.5 3.6 --  GLUCOSE 139* 169* 130*   Liver fxn  Lab 08/06/12 0445 08/05/12 2223 08/05/12 2013  AST 194* 207* 206*  ALT 86* 88* 90*  ALKPHOS 130* 130* 134*  BILITOT 0.2* 0.2* 0.2*  PROT 6.2 6.2 6.6  ALBUMIN 2.3* 2.5* 2.6*   coags  Lab 08/05/12 2223  APTT 30  INR 0.93   Sepsis markers  Lab 08/05/12 2224  LATICACIDVEN 1.2  PROCALCITON 0.51   Cardiac markers  Lab 08/06/12 1058 08/06/12 0445 08/05/12 2224  CKTOTAL -- -- --  CKMB -- -- --  TROPONINI <0.30 <0.30 <0.30    Intake/Output Summary (Last 24 hours) at 08/07/12 0921 Last data filed at 08/07/12 0800  Gross per 24 hour  Intake 1784.1 ml  Output    725 ml  Net 1059.1 ml   BNP  Lab 08/05/12 2224  PROBNP 59.2   ABG  Lab 08/07/12 0500 08/06/12 0500 08/05/12 2152  PHART 7.388 7.384 7.331*  PCO2ART 43.8 39.9 42.6  PO2ART 65.3* 62.4* 73.0*  HCO3 25.8* 23.3 22.0  TCO2 27.2 24.5 23   CBG trend No results  found for this basename: GLUCAP:5 in the last 168 hours  IMAGING:  Dg Chest 2 View  08/05/2012  *RADIOLOGY REPORT*  Clinical Data: Fever, cough and weakness.  CHEST - 2 VIEW  Comparison: None.  Findings: The lungs are well expanded.  Diffuse bilateral airspace opacification is noted, with lobar consolidation involving the right upper lobe, compatible with extensive diffuse pneumonia.  No pleural effusion or pneumothorax is seen.  The heart is normal in size; the mediastinal contour is within normal limits.  No acute osseous abnormalities are seen.  IMPRESSION: Diffuse bilateral pneumonia noted, with lobar consolidation involving the right upper lobe.   Original Report Authenticated By: Tonia Ghent, M.D.    Dg Chest Port 1 View  08/07/2012  *RADIOLOGY REPORT*  Clinical Data: Pneumonia.  PORTABLE CHEST - 1 VIEW  Comparison: 08/06/2012.  Findings: Persistent diffuse asymmetric airspace process with  prominent air bronchograms on the left.  Findings consistent with bilateral pneumonia, left greater than right.  Slight improved aeration since yesterday's film.  No large pleural effusion or pneumothorax.  IMPRESSION: Persistent but slightly improved bilateral airspace process.   Original Report Authenticated By: Rudie Meyer, M.D.    Portable Chest Xray In Am  08/06/2012  *RADIOLOGY REPORT*  Clinical Data: ARDS  PORTABLE CHEST - 1 VIEW  Comparison: 08/05/2012  Findings: 0635 hours.  Asymmetric airspace disease, left greater than right, persist without substantial change.  Heart size is stable. Telemetry leads overlie the chest.  IMPRESSION: Stable exam.   Original Report Authenticated By: Kennith Center, M.D.    ASSESSMENT / PLAN:  PULMONARY  ASSESSMENT: 1) Acute respiratory failure presumably due to influenza pneumonia with bacterial superinfection; currently resting comfortably on BIPAP but profoundly hypoxemic.  High risk for intubation.   PLAN:   - C/O of inability to maintain current respiratory effort. - Intubate and mechanically ventilate, start oscillator protocol. - Repeat ABG now and in AM. - PRN bronchodilators. - CXR portable post intubation.  CARDIOVASCULAR  ASSESSMENT:  1) Hypertension resolved, anticipate will need pressors and fluid post intubation.  PLAN:  - Hold all anti-HTN. - Tele monitor - IVF. - TLC placement. - Follow CVP. - Pressors as needed. - Cortisol level 29.7, no stress dose steroids.  RENAL  ASSESSMENT:   1) AKI likely due to poor po intake; has received over 1 liter of NS and worsening renal function PLAN:   - Fluid resuscitation. - Follow UOP closely. - Urine cr/na/urea for AKI work up. - Replace electrolytes as needed.  GASTROINTESTINAL  ASSESSMENT:   1) moderately elevated LFT's, uncertain etiology, viral? PLAN:   - Repeat LFT's. - Acute hepatitis panel pending.  HEMATOLOGIC  ASSESSMENT:   1) Leukopenia PLAN:  -F/U  CBC.  INFECTIOUS  ASSESSMENT:   1) Influenza pneumonia  2) Severe CAP: consider MRSA in addition to usual pathogens given influenza, H1N1 positive. PLAN:   -Vanc, Ceftriaxone, Levaquin. -Oseltamivir. -F/u cultures and biomarkers.  ENDOCRINE  ASSESSMENT:   1) No acute issues PLAN:   -Monitor glucose.  NEUROLOGIC  ASSESSMENT:   1) No acute issues PLAN:   -Monitor neuro status.  CLINICAL SUMMARY: 57 y/o female with acute hypoxemic respiratory failure presumably from viral pneumonia vs severe CAP.  Intubated, will likely need to start oscillator since unable to recruit.  CC time 90 minutes.  Alyson Reedy, M.D. [facility] Pulmonary/Critical Care Medicine. Pager: [phone]. After hours pager: [phone].

## 2012-08-07 NOTE — Procedures (Signed)
Intubation Procedure Note [name] [id number] January 21, 1956  Procedure: Intubation Indications: Respiratory insufficiency  Procedure Details Consent: Risks of procedure as well as the alternatives and risks of each were explained to the (patient/caregiver).  Consent for procedure obtained. Time Out: Verified patient identification, verified procedure, site/side was marked, verified correct patient position, special equipment/implants available, medications/allergies/relevent history reviewed, required imaging and test results available.  Performed  MAC   Evaluation Hemodynamic Status: BP stable throughout; O2 sats: transiently fell during during procedure and currently acceptable Patient's Current Condition: stable Complications: No apparent complications Patient did tolerate procedure well. Chest X-ray ordered to verify placement.  CXR: pending.  PRIBULA,CHRISTOPHER 08/07/2012  Alyson Reedy, M.D. [facility] Pulmonary/Critical Care Medicine. Pager: [phone]. After hours pager: [phone].

## 2012-08-07 NOTE — Procedures (Signed)
Central Venous Catheter Insertion Procedure Note [name] [id number] [birth year]  Procedure: Insertion of Central Venous Catheter Indications: Assessment of intravascular volume, Drug and/or fluid administration and Frequent blood sampling  Procedure Details Consent: Risks of procedure as well as the alternatives and risks of each were explained to the (patient/caregiver).  Consent for procedure obtained. Time Out: Verified patient identification, verified procedure, site/side was marked, verified correct patient position, special equipment/implants available, medications/allergies/relevent history reviewed, required imaging and test results available.  Performed  Maximum sterile technique was used including antiseptics, cap, gloves, gown, hand hygiene, mask and sheet. Skin prep: Chlorhexidine; local anesthetic administered A antimicrobial bonded/coated triple lumen catheter was placed in the left internal jugular vein using the Seldinger technique.  Evaluation Blood flow good Complications: No apparent complications Patient did tolerate procedure well. Chest X-ray ordered to verify placement.  CXR: pending.  U/S used in placement.  PRIBULA,CHRISTOPHER 08/07/2012, 10:38 AM  Alyson Reedy, M.D. [facility] Pulmonary/Critical Care Medicine. Pager: [phone]. After hours pager: [phone].

## 2012-08-07 NOTE — Procedures (Signed)
Arterial Catheter Insertion Procedure Note [name] [id number] 23-Mar-1956  Procedure: Insertion of Arterial Catheter  Indications: Blood pressure monitoring  Procedure Details Consent: Unable to obtain consent because of emergent medical necessity. Time Out: Verified patient identification, verified procedure, site/side was marked, verified correct patient position, special equipment/implants available, medications/allergies/relevent history reviewed, required imaging and test results available.  Performed  Maximum sterile technique was used including antiseptics, cap, gloves, gown, hand hygiene, mask and sheet. Skin prep: Chlorhexidine; local anesthetic administered 20 gauge catheter was inserted into left femoral artery using the Seldinger technique.  Evaluation Blood flow good; BP tracing good. Complications: No apparent complications.  U/S used in placement.  [name] 08/07/2012

## 2012-08-07 NOTE — Progress Notes (Signed)
[facility] PCCM  Anxious, Respiratory status unchanged  Ativan 0.5 IV q6h prn  Yolonda Kida PCCM Pager: [phone] Cell: [phone] If no response, call [phone]

## 2012-08-07 NOTE — Progress Notes (Signed)
ANTIBIOTIC CONSULT NOTE - INITIAL  Pharmacy Consult for Vancomycin and Levaquin Indication: pneumonia  Allergies  Allergen Reactions  . Aspirin Nausea And Vomiting  . Sulfa Antibiotics     nausea    Patient Measurements: Height: 5\' 7"  (170.2 cm) Weight: 250 lb (113.399 kg) (per patient) IBW/kg (Calculated) : 61.6  Adjusted Body Weight: 80 kg  Vital Signs: BP: 119/76 mmHg (01/02 0802) Pulse Rate: 62  (01/02 1131)  Labs:  [facility] 08/07/12 0803 08/06/12 0500 08/06/12 0445 08/05/12 2223  WBC 3.1* -- 2.8* 3.2*  HGB 12.5 -- 11.9* 12.5  PLT 205 -- 194 212  LABCREA -- 56.19 -- --  CREATININE 1.35* -- 2.35* 2.30*   Estimated Creatinine Clearance: 61.2 ml/min (by C-G formula based on Cr of 1.35). No results found for this basename: VANCOTROUGH:2,VANCOPEAK:2,VANCORANDOM:2,GENTTROUGH:2,GENTPEAK:2,GENTRANDOM:2,TOBRATROUGH:2,TOBRAPEAK:2,TOBRARND:2,AMIKACINPEAK:2,AMIKACINTROU:2,AMIKACIN:2, in the last 72 hours   Microbiology: Recent Results (from the past 720 hour(s))  CULTURE, BLOOD (ROUTINE X 2)     Status: Normal (Preliminary result)   Collection Time   08/05/12  9:30 PM      Component Value Range Status Comment   Specimen Description BLOOD RIGHT ARM   Final    Special Requests BOTTLES DRAWN AEROBIC AND ANAEROBIC 8CC   Final    Culture  Setup Time 08/06/2012 03:41   Final    Culture     Final    Value:        BLOOD CULTURE RECEIVED NO GROWTH TO DATE CULTURE WILL BE HELD FOR 5 DAYS BEFORE ISSUING A FINAL NEGATIVE REPORT   Report Status PENDING   Incomplete   CULTURE, BLOOD (ROUTINE X 2)     Status: Normal (Preliminary result)   Collection Time   08/05/12  9:35 PM      Component Value Range Status Comment   Specimen Description BLOOD RIGHT ARM   Final    Special Requests BOTTLES DRAWN AEROBIC ONLY 10CC   Final    Culture  Setup Time 08/06/2012 03:40   Final    Culture     Final    Value:        BLOOD CULTURE RECEIVED NO GROWTH TO DATE CULTURE WILL BE HELD FOR 5 DAYS BEFORE  ISSUING A FINAL NEGATIVE REPORT   Report Status PENDING   Incomplete   MRSA PCR SCREENING     Status: Normal   Collection Time   08/06/12 12:16 AM      Component Value Range Status Comment   MRSA by PCR NEGATIVE  NEGATIVE Final     Medical History: Past Medical History  Diagnosis Date  . Hypertension     Medications:  Prescriptions prior to admission  Medication Sig Dispense Refill  . albuterol (PROVENTIL HFA;VENTOLIN HFA) 108 (90 BASE) MCG/ACT inhaler Inhale 2 puffs into the lungs every 6 (six) hours as needed. For shortness of breath      . ibuprofen (ADVIL,MOTRIN) 400 MG tablet Take 400-800 mg by mouth every 8 (eight) hours as needed. For fever/pain      . lisinopril (PRINIVIL,ZESTRIL) 10 MG tablet Take 10 mg by mouth daily.      . medroxyPROGESTERone (PROVERA) 10 MG tablet Take 10 mg by mouth daily.       Assessment: 57 yo female with PNA for empiric antibiotics.  Scr decreased since admission.  Antibiotic doses need to be increased for better renal function.  Goal of Therapy:  Vancomycin trough level 15-20 mcg/ml  Plan:  Increase Vancomycin to 1g q12 - next dose now. Change Levaquin  to 750 mg IV q 24 hrs. - next dose now. F/U renal function and clinical progression.  Kayan Blissett C 08/07/2012,11:44 AM

## 2012-08-08 ENCOUNTER — Inpatient Hospital Stay (HOSPITAL_COMMUNITY): Payer: Managed Care, Other (non HMO)

## 2012-08-08 LAB — BLOOD GAS, ARTERIAL
Acid-base deficit: 0.2 mmol/L (ref 0.0–2.0)
Amplitude: 75
Amplitude: 69
Bicarbonate: 24.4 mEq/L — ABNORMAL HIGH (ref 20.0–24.0)
Bicarbonate: 24.4 mEq/L — ABNORMAL HIGH (ref 20.0–24.0)
Bicarbonate: 25.2 mEq/L — ABNORMAL HIGH (ref 20.0–24.0)
Drawn by: 32470
Drawn by: 36496
Drawn by: 36496
FIO2: 1 %
FIO2: 0.6 %
FIO2: 0.7 %
Hertz: 6
Hertz: 6
Hertz: 6
Map: 29.4 cmH20
Map: 32.4 cmH20
O2 Saturation: 98.9 %
O2 Saturation: 97.5 %
Patient temperature: 98.6
Patient temperature: 98.6
TCO2: 25.9 mmol/L (ref 0–100)
TCO2: 26 mmol/L (ref 0–100)
TCO2: 26.7 mmol/L (ref 0–100)
pCO2 arterial: 50.8 mmHg — ABNORMAL HIGH (ref 35.0–45.0)
pCO2 arterial: 48.4 mmHg — ABNORMAL HIGH (ref 35.0–45.0)
pCO2 arterial: 50.7 mmHg — ABNORMAL HIGH (ref 35.0–45.0)
pH, Arterial: 7.311 — ABNORMAL LOW (ref 7.350–7.450)
pH, Arterial: 7.325 — ABNORMAL LOW (ref 7.350–7.450)
pH, Arterial: 7.317 — ABNORMAL LOW (ref 7.350–7.450)
pO2, Arterial: 103 mmHg — ABNORMAL HIGH (ref 80.0–100.0)
pO2, Arterial: 129 mmHg — ABNORMAL HIGH (ref 80.0–100.0)

## 2012-08-08 LAB — CBC
MCH: 27.5 pg (ref 26.0–34.0)
MCHC: 31.7 g/dL (ref 30.0–36.0)
MCV: 86.6 fL (ref 78.0–100.0)
Platelets: 203 10*3/uL (ref 150–400)
RDW: 14.7 % (ref 11.5–15.5)

## 2012-08-08 LAB — BASIC METABOLIC PANEL
CO2: 25 mEq/L (ref 19–32)
Calcium: 8.8 mg/dL (ref 8.4–10.5)
Creatinine, Ser: 1.29 mg/dL — ABNORMAL HIGH (ref 0.50–1.10)
GFR calc Af Amer: 53 mL/min — ABNORMAL LOW (ref >90)
GFR calc non Af Amer: 46 mL/min — ABNORMAL LOW (ref >90)
Sodium: 144 mEq/L (ref 135–145)

## 2012-08-08 LAB — MAGNESIUM: Magnesium: 2.3 mg/dL (ref 1.5–2.5)

## 2012-08-08 MED ORDER — ACETAMINOPHEN 160 MG/5ML PO SOLN
650.0000 mg | Freq: Four times a day (QID) | ORAL | Status: DC | PRN
  Administered 2012-08-09 – 2012-08-10 (×2): 650 mg
  Filled 2012-08-08 (×4): qty 20.3

## 2012-08-08 MED ORDER — LEVOFLOXACIN IN D5W 750 MG/150ML IV SOLN
750.0000 mg | INTRAVENOUS | Status: DC
  Administered 2012-08-08 – 2012-08-09 (×2): 750 mg via INTRAVENOUS
  Filled 2012-08-08 (×3): qty 150

## 2012-08-08 MED ORDER — ARTIFICIAL TEARS OP OINT
TOPICAL_OINTMENT | Freq: Three times a day (TID) | OPHTHALMIC | Status: DC
  Administered 2012-08-08: 15:00:00 via OPHTHALMIC
  Administered 2012-08-08: 1 via OPHTHALMIC
  Administered 2012-08-09 (×2): via OPHTHALMIC
  Administered 2012-08-09: 1 via OPHTHALMIC
  Administered 2012-08-10: 05:00:00 via OPHTHALMIC
  Filled 2012-08-08: qty 3.5

## 2012-08-08 MED ORDER — POTASSIUM CHLORIDE 20 MEQ/15ML (10%) PO LIQD
ORAL | Status: AC
  Filled 2012-08-08: qty 30

## 2012-08-08 NOTE — Progress Notes (Signed)
Spoke with Resident MD working with CCM regarding order to insert OG tube and start tube feeds. MD states not to start  that while the patient is on the oscillator. Orders were discontinued.

## 2012-08-08 NOTE — Progress Notes (Signed)
PULMONARY  / CRITICAL CARE MEDICINE  Name: [name] MRN: [id number] DOB: 1956/02/17    LOS: 3  REFERRING MD :  March Rummage EDP  CHIEF COMPLAINT:  Hypoxemic respiratory failure   BRIEF PATIENT DESCRIPTION: 57 y/o female with Hypertension presented to the [facility] ED on 12/31 with hypoxemic respiratory failure presumably from H1N1 pneumonia.  LINES / TUBES: ET tube 1/2 >> L IJ TLC 1/2 >> L femoral a-line 1/2 >>  CULTURES: 12/31 blood >>NTD 12/31 influenza by pcr >>H1N1 positive 12/31 urine >>5000 yeast 12/31 urine st >>NTD 12/31 urine leg >>NTD  ANTIBIOTICS: 12/31 ceftriaxone >> 12/31 azithro x1  12/31 oseltamivir >> 1/1 vanc >> 1/1 levaquin >>  SIGNIFICANT EVENTS:  1/2 intubated and oscillator. 1/3 attempt to change to ARDS conventional ventilation  DIET:  TF DVT Px:  Sub q hep GI Px:  ppi  INTERVAL HISTORY:  Unable to tolerate BiPAP and reports being tired.  VITAL SIGNS: Temp:  [97.4 F (36.3 C)-99.5 F (37.5 C)] 97.4 F (36.3 C) (01/03 0755) Pulse Rate:  [52-80] 57  (01/03 0900) Resp:  [0-24] 0  (01/03 0900) BP: (74-135)/(49-81) 110/59 mmHg (01/03 0900) SpO2:  [78 %-100 %] 99 % (01/03 0900) Arterial Line BP: (98-160)/(45-70) 130/56 mmHg (01/03 0900) FiO2 (%):  [40 %-100 %] 40 % (01/03 0755) HEMODYNAMICS: CVP:  [6 mmHg-12 mmHg] 8 mmHg VENTILATOR SETTINGS: Vent Mode:  [-] PRVC FiO2 (%):  [40 %-100 %] 40 % Set Rate:  [18 bmp-20 bmp] 20 bmp Vt Set:  [490 mL] 490 mL PEEP:  [20 cmH20] 20 cmH20 Mean Airway Pressure:  [24.1 cmH2O-32.8 cmH2O] 24.1 cmH2O Plateau Pressure:  [39 cmH20-40 cmH20] 39 cmH20 INTAKE / OUTPUT: Intake/Output      01/02 0701 - 01/03 0700 01/03 0701 - 01/04 0700   P.O.     I.V. (mL/kg) 2251.2 (19.9) 217.2 (1.9)   IV Piggyback 450    Total Intake(mL/kg) 2701.2 (23.8) 217.2 (1.9)   Urine (mL/kg/hr) 980 (0.4) 200   Total Output 980 200   Net +1721.2 +17.2        PHYSICAL EXAMINATION: Gen: Sedated and paralyzed on oscillator HEENT: NCAT, OP  clear, PULM:Oscillator sounds audible but unable to auscultate breath sounds. CV: RRR, no mgr, no JVD AB: BS+, soft, nontender, no hsm Ext: warm, no edema, no clubbing, no cyanosis Derm: no rash or skin breakdown Neuro: Sedated and paralyzed.  LABS: Cbc  Lab 08/08/12 0445 08/07/12 0803 08/06/12 0445  WBC 4.1 -- --  HGB 11.7* 12.5 11.9*  HCT 36.9 38.2 36.2  PLT 203 205 194   Chemistry  Lab 08/08/12 0445 08/07/12 0803 08/06/12 0445 08/05/12 2223  NA 144 144 139 --  K 4.9 4.9 4.5 --  CL 109 106 103 --  CO2 25 27 26  --  BUN 44* 37* 49* --  CREATININE 1.29* 1.35* 2.35* --  CALCIUM 8.8 8.9 8.1* --  MG 2.3 -- 2.0 1.9  PHOS 3.5 -- 3.5 3.6  GLUCOSE 128* 137* 139* --    Intake/Output Summary (Last 24 hours) at 08/08/12 1003 Last data filed at 08/08/12 0900  Gross per 24 hour  Intake 2784.61 ml  Output   1180 ml  Net 1604.61 ml   Liver fxn  Lab 08/07/12 0803 08/06/12 0445 08/05/12 2223  AST 229* 194* 207*  ALT 85* 86* 88*  ALKPHOS 144* 130* 130*  BILITOT 0.2* 0.2* 0.2*  PROT 6.3 6.2 6.2  ALBUMIN 2.4* 2.3* 2.5*   coags  Lab 08/05/12 2223  APTT  30  INR 0.93   Sepsis markers  Lab 08/05/12 2224  LATICACIDVEN 1.2  PROCALCITON 0.51   Cardiac markers  Lab 08/06/12 1058 08/06/12 0445 08/05/12 2224  CKTOTAL -- -- --  CKMB -- -- --  TROPONINI <0.30 <0.30 <0.30    Intake/Output Summary (Last 24 hours) at 08/08/12 0959 Last data filed at 08/08/12 0900  Gross per 24 hour  Intake 2784.61 ml  Output   1180 ml  Net 1604.61 ml   BNP  Lab 08/05/12 2224  PROBNP 59.2   ABG  Lab 08/08/12 0342 08/07/12 2200 08/07/12 1750  PHART 7.316* 7.325* 7.314*  PCO2ART 49.3* 48.4* 49.5*  PO2ART 103.0* 89.4 149.0*  HCO3 24.4* 24.5* 24.4*  TCO2 26.0 26.0 25.9   CBG trend No results found for this basename: GLUCAP:5 in the last 168 hours  IMAGING:  Dg Chest Port 1 View  08/08/2012  *RADIOLOGY REPORT*  Clinical Data: Evaluate endotracheal tube position  PORTABLE CHEST - 1  VIEW  Comparison: Portable chest x-ray of 08/07/2012  Findings: The tip of the endotracheal tube is approximately 4.2 cm above the carina.  Aeration has not changed significantly and there is little change and patchy airspace disease bilaterally.  Mild cardiomegaly is stable.  The left central venous line tip overlies the left innominate vein region.  IMPRESSION:  1.  Endotracheal tube tip 4.2 cm above the carina. 2.  No change in aeration and patchy airspace disease.   Original Report Authenticated By: Dwyane Dee, M.D.    Chest Portable 1 View  08/07/2012  *RADIOLOGY REPORT*  Clinical Data: Flu related pneumonia.  PORTABLE CHEST - 1 VIEW at 5:40 p.m.  Comparison: 08/07/2012 at 11:41 a.m. and 08/06/2012  Findings: The tracheal tube and central venous catheter in good position.  There has been improvement in the bilateral pulmonary infiltrates.  No effusions.  IMPRESSION: Improved bilateral pulmonary infiltrates.   Original Report Authenticated By: Francene Boyers, M.D.    Dg Chest Port 1 View  08/07/2012  *RADIOLOGY REPORT*  Clinical Data: Endotracheal tube and central venous line placement.  PORTABLE CHEST - 1 VIEW  Comparison: 08/07/2012  Findings: An endotracheal tube is noted with tip 3.7 cm above the carina. A left IJ central venous catheter is identified with tip overlying the mid - upper SVC. Bilateral airspace opacities are unchanged. There is no evidence of pneumothorax.  IMPRESSION: Endotracheal tube placement with tip 3.7 cm above the carina.  Left IJ central venous catheter with tip overlying the mid - upper SVC. No evidence of pneumothorax.  Unchanged bilateral airspace opacities.   Original Report Authenticated By: Harmon Pier, M.D.    Dg Chest Port 1 View  08/07/2012  *RADIOLOGY REPORT*  Clinical Data: Pneumonia.  PORTABLE CHEST - 1 VIEW  Comparison: 08/06/2012.  Findings: Persistent diffuse asymmetric airspace process with prominent air bronchograms on the left.  Findings consistent with bilateral  pneumonia, left greater than right.  Slight improved aeration since yesterday's film.  No large pleural effusion or pneumothorax.  IMPRESSION: Persistent but slightly improved bilateral airspace process.   Original Report Authenticated By: Rudie Meyer, M.D.    ASSESSMENT / PLAN:  PULMONARY  ASSESSMENT: 1) Acute respiratory failure presumably due to influenza pneumonia with bacterial superinfection; intubated on 1/2 on the oscillator with significant improvement in oxygenation.   PLAN:   - Improved on the oscillator, down to 40% and MAP of 24. - Will attempt to get to ARDS protocol if maintained after cuff is reinflated. - Repeat ABG  now after inflation of the ETT cuff and in AM. - PRN bronchodilators. - CXR per protocol.  CARDIOVASCULAR  ASSESSMENT:  1) Hypertension resolved, anticipate will need pressors and fluid post intubation.  PLAN:  - Continue to hold all anti-HTN. - Tele monitor - IVF, reduce to 50 ml/hr. - Follow CVP. - Pressors as needed. - Cortisol level 29.7, no stress dose steroids.  RENAL  ASSESSMENT:   1) AKI likely due to poor po intake; has received over 1 liter of NS and fluid resuscitation, renal function improving and UOP improving PLAN:   - Decrease NS to 50 ml/hr. - Follow UOP closely. - Replace electrolytes as needed.  GASTROINTESTINAL  ASSESSMENT:   1) moderately elevated LFT's, uncertain etiology, viral? PLAN:   - Repeat LFT's in AM. 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  HEMATOLOGIC  ASSESSMENT:   1) Leukopenia PLAN:  -F/U CBC.  INFECTIOUS  ASSESSMENT:   1) Influenza pneumonia  2) Severe CAP: consider MRSA in addition to usual pathogens given influenza, H1N1 positive. PLAN:   -Vanc, Ceftriaxone, Levaquin. -Oseltamivir. -F/u cultures and biomarkers.  ENDOCRINE  ASSESSMENT:   1) No acute issues PLAN:   -Monitor glucose.  NEUROLOGIC  ASSESSMENT:   1) No  acute issues PLAN:   -Monitor neuro status.  CLINICAL SUMMARY: 57 y/o female with acute hypoxemic respiratory failure presumably from viral pneumonia vs severe CAP.  Intubated improving with oscillator, will attempt to change to ARDS protocol today.  CC time 35 minutes.  Alyson Reedy, M.D. [facility] Pulmonary/Critical Care Medicine. Pager: [phone]. After hours pager: [phone].

## 2012-08-09 ENCOUNTER — Inpatient Hospital Stay (HOSPITAL_COMMUNITY): Payer: Managed Care, Other (non HMO)

## 2012-08-09 LAB — BLOOD GAS, ARTERIAL
Acid-base deficit: 1.5 mmol/L (ref 0.0–2.0)
Acid-base deficit: 1.8 mmol/L (ref 0.0–2.0)
Bicarbonate: 23.9 mEq/L (ref 20.0–24.0)
Bicarbonate: 23.4 mEq/L (ref 20.0–24.0)
FIO2: 0.7 %
FIO2: 0.7 %
O2 Saturation: 97.1 %
PEEP: 16 cmH2O
Patient temperature: 98.6
Pressure control: 18 cmH2O
Pressure control: 14 cmH2O
RATE: 24 resp/min
RATE: 24 resp/min
RATE: 24 resp/min
TCO2: 25.2 mmol/L (ref 0–100)
TCO2: 24.8 mmol/L (ref 0–100)
pCO2 arterial: 45.6 mmHg — ABNORMAL HIGH (ref 35.0–45.0)
pCO2 arterial: 49.8 mmHg — ABNORMAL HIGH (ref 35.0–45.0)
pH, Arterial: 7.345 — ABNORMAL LOW (ref 7.350–7.450)
pO2, Arterial: 74.7 mmHg — ABNORMAL LOW (ref 80.0–100.0)

## 2012-08-09 LAB — BASIC METABOLIC PANEL
BUN: 46 mg/dL — ABNORMAL HIGH (ref 6–23)
Chloride: 111 mEq/L (ref 96–112)
Creatinine, Ser: 1.43 mg/dL — ABNORMAL HIGH (ref 0.50–1.10)
GFR calc Af Amer: 47 mL/min — ABNORMAL LOW (ref >90)
GFR calc non Af Amer: 40 mL/min — ABNORMAL LOW (ref >90)
Glucose, Bld: 114 mg/dL — ABNORMAL HIGH (ref 70–99)
Potassium: 4.4 mEq/L (ref 3.5–5.1)

## 2012-08-09 LAB — HEPATIC FUNCTION PANEL
ALT: 62 U/L — ABNORMAL HIGH (ref 0–35)
ALT: 58 U/L — ABNORMAL HIGH (ref 0–35)
Albumin: 2 g/dL — ABNORMAL LOW (ref 3.5–5.2)
Alkaline Phosphatase: 114 U/L (ref 39–117)
Alkaline Phosphatase: 109 U/L (ref 39–117)
Bilirubin, Direct: 0.6 mg/dL — ABNORMAL HIGH (ref 0.0–0.3)
Indirect Bilirubin: 0.2 mg/dL — ABNORMAL LOW (ref 0.3–0.9)
Total Protein: 5.8 g/dL — ABNORMAL LOW (ref 6.0–8.3)

## 2012-08-09 LAB — CBC
HCT: 34.2 % — ABNORMAL LOW (ref 36.0–46.0)
Hemoglobin: 11.1 g/dL — ABNORMAL LOW (ref 12.0–15.0)
MCHC: 32.5 g/dL (ref 30.0–36.0)
MCV: 86.6 fL (ref 78.0–100.0)
RDW: 14.8 % (ref 11.5–15.5)

## 2012-08-09 LAB — URINE MICROSCOPIC-ADD ON

## 2012-08-09 LAB — URINALYSIS, ROUTINE W REFLEX MICROSCOPIC
Ketones, ur: 15 mg/dL — AB
Nitrite: POSITIVE — AB
Specific Gravity, Urine: 1.023 (ref 1.005–1.030)
Urobilinogen, UA: 1 mg/dL (ref 0.0–1.0)
pH: 5 (ref 5.0–8.0)

## 2012-08-09 LAB — HEPATITIS B CORE ANTIBODY, TOTAL: Hep B Core Total Ab: NEGATIVE

## 2012-08-09 MED ORDER — OXEPA PO LIQD
1000.0000 mL | ORAL | Status: DC
  Administered 2012-08-09: 1000 mL
  Filled 2012-08-09 (×2): qty 1000

## 2012-08-09 MED ORDER — OXEPA PO LIQD
1000.0000 mL | ORAL | Status: DC
  Filled 2012-08-09 (×2): qty 1000

## 2012-08-09 MED ORDER — PRO-STAT SUGAR FREE PO LIQD
60.0000 mL | Freq: Three times a day (TID) | ORAL | Status: DC
Start: 2012-08-09 — End: 2012-08-11
  Administered 2012-08-09 – 2012-08-11 (×4): 60 mL
  Filled 2012-08-09 (×8): qty 60

## 2012-08-09 MED ORDER — SODIUM CHLORIDE 0.45 % IV SOLN
INTRAVENOUS | Status: DC
  Administered 2012-08-09: 20 mL/h via INTRAVENOUS

## 2012-08-09 MED ORDER — PANTOPRAZOLE SODIUM 40 MG PO PACK
40.0000 mg | PACK | Freq: Every day | ORAL | Status: DC
  Administered 2012-08-09 – 2012-08-11 (×3): 40 mg
  Filled 2012-08-09 (×3): qty 20

## 2012-08-09 NOTE — Progress Notes (Signed)
Urine cloudy per nursing staff   UA and Urine culture ordered

## 2012-08-09 NOTE — Progress Notes (Signed)
PULMONARY  / CRITICAL CARE MEDICINE  Name: [name] MRN: [id number] DOB: 09/19/1955    LOS: 4  REFERRING MD :  March Rummage EDP  CHIEF COMPLAINT:  Hypoxemic respiratory failure   BRIEF PATIENT DESCRIPTION: 57 y/o female with Hypertension presented to the [facility] ED on 12/31 with hypoxemic respiratory failure presumably from H1N1 pneumonia.  LINES / TUBES: ET tube 1/2 >> L IJ TLC 1/2 >> L femoral a-line 1/2 >>  CULTURES: 12/31 blood >>NTD 12/31 influenza by pcr >>H1N1 positive 12/31 urine >>5000 yeast 12/31 urine st >>NTD 12/31 urine leg >>NTD  ANTIBIOTICS: 12/31 ceftriaxone >> 12/31 azithro x1  12/31 oseltamivir >> 1/1 vanc >> 1/1 levaquin >>  SIGNIFICANT EVENTS:  1/2 intubated and oscillator. 1/3 changed to ARDS conventional ventilation / PC 1/4- remains on peep 16, 70%  DIET:  TF DVT Px:  Sub q hep GI Px:  ppi  VITAL SIGNS: Temp:  [97.2 F (36.2 C)-101.2 F (38.4 C)] 99.8 F (37.7 C) (01/04 0400) Pulse Rate:  [56-85] 67  (01/04 0830) Resp:  [0-35] 24  (01/04 0830) BP: (90-147)/(31-68) 101/48 mmHg (01/04 0830) SpO2:  [87 %-100 %] 96 % (01/04 0830) Arterial Line BP: (106-158)/(44-72) 108/44 mmHg (01/04 0800) FiO2 (%):  [40 %-100 %] 70 % (01/04 0830) Weight:  [164.5 kg (362 lb 10.5 oz)] 164.5 kg (362 lb 10.5 oz) (01/04 0500) HEMODYNAMICS: CVP:  [5 mmHg-14 mmHg] 10 mmHg VENTILATOR SETTINGS: Vent Mode:  [-] PCV FiO2 (%):  [40 %-100 %] 70 % Set Rate:  [24 bmp-35 bmp] 24 bmp Vt Set:  [490 mL] 490 mL PEEP:  [10 cmH20-16 cmH20] 16 cmH20 Mean Airway Pressure:  [24.1 cmH2O] 24.1 cmH2O Plateau Pressure:  [23 cmH20-32 cmH20] 31 cmH20 INTAKE / OUTPUT: Intake/Output      01/03 0701 - 01/04 0700 01/04 0701 - 01/05 0700   I.V. (mL/kg) 2056.8 (12.5) 83.8 (0.5)   IV Piggyback 600    Total Intake(mL/kg) 2656.8 (16.2) 83.8 (0.5)   Urine (mL/kg/hr) 955 (0.2) 50   Total Output 955 50   Net +1701.8 +33.8        PHYSICAL EXAMINATION: Gen: Sedated on PCV HEENT: NCAT, OP  clear, PULM:decreased bs bases. CV: RRR, no mgr, no JVD AB: BS+, soft, nontender, no hsm Ext: warm, no edema, no clubbing, no cyanosis Derm: no rash or skin breakdown Neuro: Sedated but arouses to strong stimulus  LABS: Cbc  Lab 08/09/12 0400 08/08/12 0445 08/07/12 0803  WBC 5.3 -- --  HGB 11.1* 11.7* 12.5  HCT 34.2* 36.9 38.2  PLT 202 203 205   Chemistry  Lab 08/09/12 0400 08/08/12 0445 08/07/12 0803 08/06/12 0445  NA 145 144 144 --  K 4.4 4.9 4.9 --  CL 111 109 106 --  CO2 24 25 27  --  BUN 46* 44* 37* --  CREATININE 1.43* 1.29* 1.35* --  CALCIUM 8.8 8.8 8.9 --  MG 2.1 2.3 -- 2.0  PHOS 2.1* 3.5 -- 3.5  GLUCOSE 114* 128* 137* --    Intake/Output Summary (Last 24 hours) at 08/09/12 0844 Last data filed at 08/09/12 0800  Gross per 24 hour  Intake 2640.52 ml  Output    905 ml  Net 1735.52 ml   Liver fxn  Lab 08/09/12 0400 08/07/12 0803 08/06/12 0445  AST 119* 229* 194*  ALT 62* 85* 86*  ALKPHOS 114 144* 130*  BILITOT 0.6 0.2* 0.2*  PROT 5.8* 6.3 6.2  ALBUMIN 2.0* 2.4* 2.3*   coags  Lab 08/05/12 2223  APTT 30  INR 0.93   Sepsis markers  Lab 08/05/12 2224  LATICACIDVEN 1.2  PROCALCITON 0.51   Cardiac markers  Lab 08/06/12 1058 08/06/12 0445 08/05/12 2224  CKTOTAL -- -- --  CKMB -- -- --  TROPONINI <0.30 <0.30 <0.30    Intake/Output Summary (Last 24 hours) at 08/09/12 0844 Last data filed at 08/09/12 0800  Gross per 24 hour  Intake 2640.52 ml  Output    905 ml  Net 1735.52 ml   BNP  Lab 08/05/12 2224  PROBNP 59.2   ABG  Lab 08/09/12 0423 08/08/12 1532 08/08/12 1338  PHART 7.418 7.317* 7.489*  PCO2ART 35.4 50.7* 31.7*  PO2ART 74.7* 129.0* 51.4*  HCO3 22.4 25.2* 23.9  TCO2 23.5 26.7 24.8   CBG trend No results found for this basename: GLUCAP:5 in the last 168 hours  IMAGING:  Dg Chest Port 1 View  08/09/2012  *RADIOLOGY REPORT*  Clinical Data: Evaluate endotracheal tube position, intubation, hypertension  PORTABLE CHEST - 1 VIEW   Comparison: Portable exam 0523 hours compared to 08/08/2012  Findings: Tip of endotracheal tube 4.7 cm above carina. Nasogastric tube extends into stomach. Left jugular line, tip projecting over either the SVC or the left brachiocephalic vein on an exam rotated to the left. Heart size stable. Bilateral pulmonary infiltrates unchanged. No gross pleural effusion or pneumothorax.  IMPRESSION: Persistent pulmonary infiltrates bilaterally.   Original Report Authenticated By: Ulyses Southward, M.D.    Dg Chest Port 1 View  08/08/2012  *RADIOLOGY REPORT*  Clinical Data: Acute change in respiratory status.  Ventilator dependent respiratory failure.  PORTABLE CHEST - 1 VIEW 08/08/2012 1422 hours:  Comparison: Portable chest x-ray earlier same date 0602 hours.  Findings: Endotracheal tube tip remains in satisfactory position projecting approximately 4 cm above carina.  Left jugular central venous catheter tip projects over the upper SVC.  Cardiac silhouette upper normal in size for technique and degree of inspiration, unchanged.  Airspace opacities in both mid and lower lungs, most confluent at the left base, unchanged.  No new pulmonary parenchymal abnormalities.  IMPRESSION: Support apparatus satisfactory.  Stable pneumonia involving both lungs, most confluent at the left lung base.  No new abnormalities.   Original Report Authenticated By: Hulan Saas, M.D.    Dg Chest Port 1 View  08/08/2012  *RADIOLOGY REPORT*  Clinical Data: Evaluate endotracheal tube position  PORTABLE CHEST - 1 VIEW  Comparison: Portable chest x-ray of 08/07/2012  Findings: The tip of the endotracheal tube is approximately 4.2 cm above the carina.  Aeration has not changed significantly and there is little change and patchy airspace disease bilaterally.  Mild cardiomegaly is stable.  The left central venous line tip overlies the left innominate vein region.  IMPRESSION:  1.  Endotracheal tube tip 4.2 cm above the carina. 2.  No change in aeration  and patchy airspace disease.   Original Report Authenticated By: Dwyane Dee, M.D.    Chest Portable 1 View  08/07/2012  *RADIOLOGY REPORT*  Clinical Data: Flu related pneumonia.  PORTABLE CHEST - 1 VIEW at 5:40 p.m.  Comparison: 08/07/2012 at 11:41 a.m. and 08/06/2012  Findings: The tracheal tube and central venous catheter in good position.  There has been improvement in the bilateral pulmonary infiltrates.  No effusions.  IMPRESSION: Improved bilateral pulmonary infiltrates.   Original Report Authenticated By: Francene Boyers, M.D.    Dg Chest Port 1 View  08/07/2012  *RADIOLOGY REPORT*  Clinical Data: Endotracheal tube and central venous line placement.  PORTABLE CHEST - 1 VIEW  Comparison: 08/07/2012  Findings: An endotracheal tube is noted with tip 3.7 cm above the carina. A left IJ central venous catheter is identified with tip overlying the mid - upper SVC. Bilateral airspace opacities are unchanged. There is no evidence of pneumothorax.  IMPRESSION: Endotracheal tube placement with tip 3.7 cm above the carina.  Left IJ central venous catheter with tip overlying the mid - upper SVC. No evidence of pneumothorax.  Unchanged bilateral airspace opacities.   Original Report Authenticated By: Harmon Pier, M.D.    Dg Abd Portable 1v  08/08/2012  *RADIOLOGY REPORT*  Clinical Data: Orogastric tube placement.  PORTABLE ABDOMEN - 1 VIEW  Comparison: None.  Findings: Orogastric tube extending into the stomach with its side hole in the proximal stomach.  The tube extends laterally with its tip not included.  Normal bowel gas pattern.  Mild scoliosis, most likely positional.  IMPRESSION: Orogastric tube in the proximal stomach.   Original Report Authenticated By: Beckie Salts, M.D.    ASSESSMENT / PLAN:  PULMONARY  ASSESSMENT: 1) Acute respiratory failure presumably due to influenza pneumonia with bacterial superinfection; intubated on 1/2 on the oscillator with significant improvement in oxygenation. 1-3 changes  to PCV  PLAN:   - Improved on PCV .7 fio2, pc 18 over 16 peep with Vt 545, sats 96% - abg reviewed, reduce PC to 14, and no peep reduction until to 50% -pcxr is improved -even balance goals  - PRN bronchodilators. - CXR in am -Paralysis may be re required  CARDIOVASCULAR  ASSESSMENT:  1) Shock, etiology sepsis, peep? Prior hfoc  PLAN:  - Continue to hold all anti-HTN. - Tele monitor - IVF, kvo - Follow CVP- 10, no FACTT until off pressors - Pressors as needed.On dopamine will wean as of 1-4 Hope to reduce peep soon and drop pressor needs Trop neg  RENAL Lab Results  Component Value Date   CREATININE 1.43* 08/09/2012   CREATININE 1.29* 08/08/2012   CREATININE 1.35* 08/07/2012    ASSESSMENT:   1) AKI  PLAN:   - Decrease NSto 1/2 NS, as Cl up, kvo - Follow UOP closely.  GASTROINTESTINAL  ASSESSMENT:   1) moderately elevated LFT's, uncertain etiology, viral? 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 1-4 hep c reactive Start TF, oxepa  HEMATOLOGIC  ASSESSMENT:   1) Leukopenia PLAN:  -F/U CBC Sub q hep  INFECTIOUS  ASSESSMENT:   1) Influenza pneumonia  2) Severe CAP: consider MRSA in addition to usual pathogens given influenza, H1N1 positive. PLAN:   -Vanc, Ceftriaxone, Levaquin-maintain -Oseltamivir high dose  ENDOCRINE  ASSESSMENT:   1) No acute issues PLAN:   -Monitor glucose.  NEUROLOGIC  ASSESSMENT:   1) No acute issues PLAN:   -verse, fent -unable to do WUA Low threshold to re paralyze, add prn  CLINICAL SUMMARY: 58 y/o female with acute hypoxemic respiratory failure presumably from viral pneumonia vs severe CAP.  Changed to PCV 1-3 . 1-4 wean dopa. Wean per ARDS protocol. Note hep c reactive. Slow progress   Brett Canales Minor ACNP [facility] Pager [phone] till 3 pm If no answer page [phone] 08/09/2012, 8:45 AM  Ccm time 30 min  I have fully examined  this patient and agree with above findings.    and edited in full  Mcarthur Rossetti. Tyson Alias, MD, FACP Pgr: [phone] [place] [facility]

## 2012-08-09 NOTE — Plan of Care (Signed)
Problem: ICU Phase Progression Outcomes Goal: O2 sats trending toward baseline Outcome: Progressing 1/4: Pt requires 70% O2 and 16 PEEP at this time. Will desaturate upon lying supine for any amount of time. Goal: Hemodynamically stable Outcome: Progressing 1/4: Pt is requiring dopamine to maintain her blood pressure.  Goal: Pain controlled with appropriate interventions Outcome: Progressing 1/4: Pt is sedated with fentanyl and propofol.

## 2012-08-09 NOTE — Progress Notes (Signed)
NUTRITION FOLLOW UP  Intervention:   Initiate TF via OG tube with Oxepa at 15 ml/h, increase by 10 ml every 4 hours to goal rate of 25 ml/h with Prostat 60 ml TID to provide 1500 kcals (24.4 kcals/kg ideal weight), 128 gm protein, 471 ml free water daily.  Nutrition Dx:   Inadequate oral intake related to inability to eat as evidenced by NPO status, ongoing.  Goal:   Enteral nutrition to provide 60-70% of estimated calorie needs (22-25 kcals/kg ideal body weight) and >/= 90% of estimated protein needs, based on ASPEN guidelines for permissive underfeeding in critically ill obese individuals.  Monitor:   TF tolerance/adequacy, weight trend, labs, vent status  Assessment:   Patient transitioned to conventional ventilator on 1/3, remains intubated on ventilator support.   MV: 11.5 Temp:Temp (24hrs), Avg:100 F (37.8 C), Min:98.5 F (36.9 C), Max:101.2 F (38.4 C)   Height: Ht Readings from Last 1 Encounters:  08/06/12 5\' 7"  (1.702 m)    Weight Status:   Wt Readings from Last 1 Encounters:  08/09/12 362 lb 10.5 oz (164.5 kg)  weight up from 113.399 kg on 1/1  Re-estimated needs:  Kcal: 2250 Permissive underfeeding kcal goal (22-25 kcals/kg ideal body weight): 1350 - 1535 kcal Protein: >130 gm Fluid: 2.2 L  Skin: no problems noted  Diet Order: NPO   Intake/Output Summary (Last 24 hours) at 08/09/12 1426 Last data filed at 08/09/12 1316  Gross per 24 hour  Intake 2451.62 ml  Output    815 ml  Net 1636.62 ml    Labs:   Lab 08/09/12 0400 08/08/12 0445 08/07/12 0803 08/06/12 0445  NA 145 144 144 --  K 4.4 4.9 4.9 --  CL 111 109 106 --  CO2 24 25 27  --  BUN 46* 44* 37* --  CREATININE 1.43* 1.29* 1.35* --  CALCIUM 8.8 8.8 8.9 --  MG 2.1 2.3 -- 2.0  PHOS 2.1* 3.5 -- 3.5  GLUCOSE 114* 128* 137* --    CBG (last 3)  No results found for this basename: GLUCAP:3 in the last 72 hours  Scheduled Meds:   . antiseptic oral rinse  15 mL Mouth Rinse q12n4p  .  artificial tears   Both Eyes Q8H  . cefTRIAXone (ROCEPHIN)  IV  2 g Intravenous Q24H  . chlorhexidine  15 mL Mouth Rinse BID  . heparin  5,000 Units Subcutaneous Q8H  . levofloxacin (LEVAQUIN) IV  750 mg Intravenous Q24H  . oseltamivir  150 mg Oral BID  . pantoprazole sodium  40 mg Per Tube Daily  . vancomycin  1,000 mg Intravenous Q12H    Continuous Infusions:   . sodium chloride 20 mL/hr (08/09/12 1342)  . cisatracurium (NIMBEX) infusion Stopped (08/08/12 1321)  . DOPamine 5 mcg/kg/min (08/09/12 1300)  . feeding supplement (OXEPA) 1,000 mL (08/09/12 1359)  . fentaNYL infusion INTRAVENOUS 150 mcg/hr (08/09/12 1200)  . midazolam (VERSED) infusion 6 mg/hr (08/09/12 1200)    Joaquin Courts, RD, LDN, CNSC Pager# [phone] After Hours Pager# [phone]

## 2012-08-10 ENCOUNTER — Inpatient Hospital Stay (HOSPITAL_COMMUNITY): Payer: Managed Care, Other (non HMO)

## 2012-08-10 LAB — MAGNESIUM: Magnesium: 2.2 mg/dL (ref 1.5–2.5)

## 2012-08-10 LAB — GLUCOSE, CAPILLARY
Glucose-Capillary: 106 mg/dL — ABNORMAL HIGH (ref 70–99)
Glucose-Capillary: 120 mg/dL — ABNORMAL HIGH (ref 70–99)

## 2012-08-10 LAB — BLOOD GAS, ARTERIAL
Drawn by: 32470
PEEP: 16 cmH2O
Pressure control: 14 cmH2O
RATE: 24 resp/min
pCO2 arterial: 45.9 mmHg — ABNORMAL HIGH (ref 35.0–45.0)
pO2, Arterial: 129 mmHg — ABNORMAL HIGH (ref 80.0–100.0)

## 2012-08-10 LAB — POCT I-STAT 3, ART BLOOD GAS (G3+)
Acid-base deficit: 6 mmol/L — ABNORMAL HIGH (ref 0.0–2.0)
Acid-base deficit: 6 mmol/L — ABNORMAL HIGH (ref 0.0–2.0)
Bicarbonate: 25 mEq/L — ABNORMAL HIGH (ref 20.0–24.0)
Bicarbonate: 21.4 mEq/L (ref 20.0–24.0)
O2 Saturation: 100 %
O2 Saturation: 94 %
Patient temperature: 99.8
Patient temperature: 99.8
TCO2: 27 mmol/L (ref 0–100)
TCO2: 23 mmol/L (ref 0–100)
pCO2 arterial: 81.4 mmHg (ref 35.0–45.0)
pCO2 arterial: 53.2 mmHg — ABNORMAL HIGH (ref 35.0–45.0)
pH, Arterial: 7.1 — CL (ref 7.350–7.450)
pH, Arterial: 7.216 — ABNORMAL LOW (ref 7.350–7.450)
pO2, Arterial: 273 mmHg — ABNORMAL HIGH (ref 80.0–100.0)
pO2, Arterial: 91 mmHg (ref 80.0–100.0)

## 2012-08-10 LAB — CBC
HCT: 33.7 % — ABNORMAL LOW (ref 36.0–46.0)
Hemoglobin: 10.6 g/dL — ABNORMAL LOW (ref 12.0–15.0)
MCV: 86.6 fL (ref 78.0–100.0)
RBC: 3.89 MIL/uL (ref 3.87–5.11)
WBC: 6.6 10*3/uL (ref 4.0–10.5)

## 2012-08-10 LAB — BASIC METABOLIC PANEL
BUN: 64 mg/dL — ABNORMAL HIGH (ref 6–23)
BUN: 74 mg/dL — ABNORMAL HIGH (ref 6–23)
CO2: 22 mEq/L (ref 19–32)
CO2: 24 mEq/L (ref 19–32)
Calcium: 8.7 mg/dL (ref 8.4–10.5)
Chloride: 107 mEq/L (ref 96–112)
Creatinine, Ser: 2.43 mg/dL — ABNORMAL HIGH (ref 0.50–1.10)
Creatinine, Ser: 3.2 mg/dL — ABNORMAL HIGH (ref 0.50–1.10)
GFR calc non Af Amer: 15 mL/min — ABNORMAL LOW (ref >90)
Glucose, Bld: 119 mg/dL — ABNORMAL HIGH (ref 70–99)
Glucose, Bld: 112 mg/dL — ABNORMAL HIGH (ref 70–99)
Potassium: 4.5 mEq/L (ref 3.5–5.1)

## 2012-08-10 LAB — PHOSPHORUS: Phosphorus: 3 mg/dL (ref 2.3–4.6)

## 2012-08-10 MED ORDER — CHLORHEXIDINE GLUCONATE 0.12 % MT SOLN
15.0000 mL | Freq: Two times a day (BID) | OROMUCOSAL | Status: DC
  Administered 2012-08-10 – 2012-08-11 (×3): 15 mL via OROMUCOSAL
  Filled 2012-08-10 (×2): qty 15

## 2012-08-10 MED ORDER — ARTIFICIAL TEARS OP OINT
1.0000 "application " | TOPICAL_OINTMENT | Freq: Three times a day (TID) | OPHTHALMIC | Status: DC
  Administered 2012-08-10 – 2012-08-11 (×3): 1 via OPHTHALMIC
  Filled 2012-08-10 (×2): qty 3.5

## 2012-08-10 MED ORDER — VECURONIUM BROMIDE 10 MG IV SOLR
6.0000 mg | INTRAVENOUS | Status: DC | PRN
  Filled 2012-08-10: qty 10

## 2012-08-10 MED ORDER — SODIUM CHLORIDE 0.9 % IV SOLN
3.0000 ug/kg/min | INTRAVENOUS | Status: DC
  Filled 2012-08-10: qty 20

## 2012-08-10 MED ORDER — CISATRACURIUM BOLUS VIA INFUSION
0.1000 mg/kg | Freq: Once | INTRAVENOUS | Status: AC
  Administered 2012-08-10: 11.3 mg via INTRAVENOUS
  Filled 2012-08-10: qty 12

## 2012-08-10 MED ORDER — SODIUM CHLORIDE 0.9 % IV SOLN
3.0000 ug/kg/min | INTRAVENOUS | Status: DC
  Administered 2012-08-10: 3 ug/kg/min via INTRAVENOUS
  Filled 2012-08-10 (×2): qty 20

## 2012-08-10 MED ORDER — DEXTROSE 5 % IV SOLN
1.0000 g | Freq: Two times a day (BID) | INTRAVENOUS | Status: DC
  Administered 2012-08-10 – 2012-08-11 (×2): 1 g via INTRAVENOUS
  Filled 2012-08-10 (×4): qty 1

## 2012-08-10 MED ORDER — NOREPINEPHRINE BITARTRATE 1 MG/ML IJ SOLN
2.0000 ug/min | INTRAVENOUS | Status: DC
  Administered 2012-08-10: 10 ug/min via INTRAVENOUS
  Filled 2012-08-10 (×3): qty 4

## 2012-08-10 MED ORDER — BIOTENE DRY MOUTH MT LIQD
15.0000 mL | Freq: Four times a day (QID) | OROMUCOSAL | Status: DC
  Administered 2012-08-10 – 2012-08-11 (×6): 15 mL via OROMUCOSAL

## 2012-08-10 MED ORDER — BISACODYL 10 MG RE SUPP
10.0000 mg | Freq: Every day | RECTAL | Status: DC | PRN
  Filled 2012-08-10: qty 1

## 2012-08-10 MED ORDER — CISATRACURIUM BOLUS VIA INFUSION
0.1000 mg/kg | Freq: Once | INTRAVENOUS | Status: DC
  Filled 2012-08-10: qty 17

## 2012-08-10 MED ORDER — INSULIN ASPART 100 UNIT/ML ~~LOC~~ SOLN
0.0000 [IU] | SUBCUTANEOUS | Status: DC
  Administered 2012-08-11 (×2): 1 [IU] via SUBCUTANEOUS

## 2012-08-10 MED ORDER — LEVOFLOXACIN IN D5W 750 MG/150ML IV SOLN
750.0000 mg | INTRAVENOUS | Status: DC
  Filled 2012-08-10: qty 150

## 2012-08-10 MED ORDER — SODIUM CHLORIDE 0.9 % IV BOLUS (SEPSIS)
750.0000 mL | Freq: Once | INTRAVENOUS | Status: AC
  Administered 2012-08-10: 750 mL via INTRAVENOUS

## 2012-08-10 MED ORDER — FUROSEMIDE 10 MG/ML IJ SOLN
12.0000 mg/h | INTRAVENOUS | Status: DC
  Administered 2012-08-10: 5 mg/h via INTRAVENOUS
  Filled 2012-08-10 (×4): qty 25

## 2012-08-10 NOTE — Progress Notes (Signed)
eLink Physician-Brief Progress Note Patient Name: [name] DOB: 19-Feb-1956 MRN: [id number]  Date of Service  08/10/2012   HPI/Events of Note   Severe resp acidosis on ABg autoPEEP ++  eICU Interventions  Drop I: E to 1:1 - this enables increase in RR to 30 Tv increases to 480 range = 8cc, still at low stretch range,  autoPEEP much decreased   Intervention Category Major Interventions: Acid-Base disturbance - evaluation and management  ALVA,RAKESH V. 08/10/2012, 8:46 PM

## 2012-08-10 NOTE — Progress Notes (Signed)
Pt continues to have low UOP.  MD (CCM) notified.  No orders given at this time.  Will continue to monitor.

## 2012-08-10 NOTE — Progress Notes (Signed)
PULMONARY  / CRITICAL CARE MEDICINE  Name: [name] MRN: [id number] DOB: January 06, 1956    LOS: 5  REFERRING MD :  March Rummage EDP  CHIEF COMPLAINT:  Hypoxemic respiratory failure   BRIEF PATIENT DESCRIPTION: 57 y/o female with Hypertension presented to the [facility] ED on 12/31 with hypoxemic respiratory failure presumably from H1N1 pneumonia.  LINES / TUBES: ET tube 1/2 >> L IJ TLC 1/2 >> L femoral a-line 1/2 >>  CULTURES: 12/31 blood >>NTD 12/31 influenza by pcr >>H1N1 positive 12/31 urine >>5000 yeast 12/31 urine st >>NTD 12/31 urine leg >>NTD  ANTIBIOTICS: 12/31 ceftriaxone >>1/5 ceftaz 1/5>>> 12/31 azithro x1  12/31 oseltamivir >> 1/1 vanc >> 1/1 levaquin >>  SIGNIFICANT EVENTS:  1/2 intubated and oscillator. 1/3 changed to ARDS conventional ventilation / PC 1/4- remains on peep 16, 70% 1-5 remains on pressors. Decrease peep to 14 and check abg 1 hour.   DIET:  TF DVT Px:  Sub q hep GI Px:  ppi  VITAL SIGNS: Temp:  [100.3 F (37.9 C)-101.2 F (38.4 C)] 101.2 F (38.4 C) (01/05 0400) Pulse Rate:  [67-84] 78  (01/05 0700) Resp:  [12-24] 15  (01/05 0700) BP: (80-107)/(27-49) 100/47 mmHg (01/05 0700) SpO2:  [94 %-98 %] 94 % (01/05 0700) Arterial Line BP: (91-129)/(39-54) 106/46 mmHg (01/05 0700) FiO2 (%):  [70 %] 70 % (01/05 0700) Weight:  [168.9 kg (372 lb 5.7 oz)] 168.9 kg (372 lb 5.7 oz) (01/05 0100) HEMODYNAMICS: CVP:  [10 mmHg-13 mmHg] 10 mmHg VENTILATOR SETTINGS: Vent Mode:  [-] PCV FiO2 (%):  [70 %] 70 % Set Rate:  [24 bmp] 24 bmp PEEP:  [16 cmH20] 16 cmH20 Plateau Pressure:  [28 cmH20-33 cmH20] 28 cmH20 INTAKE / OUTPUT: Intake/Output      01/04 0701 - 01/05 0700 01/05 0701 - 01/06 0700   I.V. (mL/kg) 1407.9 (8.3)    NG/GT 360    IV Piggyback 600    Total Intake(mL/kg) 2367.9 (14)    Urine (mL/kg/hr) 385 (0.1)    Total Output 385    Net +1982.9         PHYSICAL EXAMINATION: Gen: Sedated on PCV HEENT: NCAT, OP clear, PULM:decreased bs bases,  coarse CV: RRR, no mgr, no JVD AB: BS+, soft, nontender, +BS, on tf Ext: warm, mild  edema, no clubbing, no cyanosis Derm: no rash or skin breakdown Neuro: Sedated but arouses to strong stimulus  LABS: Cbc  Lab 08/10/12 0455 08/09/12 0400 08/08/12 0445  WBC 6.6 -- --  HGB 10.6* 11.1* 11.7*  HCT 33.7* 34.2* 36.9  PLT 245 202 203   Chemistry  Lab 08/10/12 0455 08/09/12 0400 08/08/12 0445  NA 143 145 144  K 4.5 4.4 4.9  CL 107 111 109  CO2 22 24 25   BUN 64* 46* 44*  CREATININE 2.43* 1.43* 1.29*  CALCIUM 8.9 8.8 8.8  MG 2.2 2.1 2.3  PHOS 3.0 2.1* 3.5  GLUCOSE 119* 114* 128*    Intake/Output Summary (Last 24 hours) at 08/10/12 0808 Last data filed at 08/10/12 0700  Gross per 24 hour  Intake 2284.11 ml  Output    335 ml  Net 1949.11 ml   Liver fxn  Lab 08/09/12 0854 08/09/12 0400 08/07/12 0803  AST 102* 119* 229*  ALT 58* 62* 85*  ALKPHOS 109 114 144*  BILITOT 0.8 0.6 0.2*  PROT 5.7* 5.8* 6.3  ALBUMIN 1.9* 2.0* 2.4*   coags  Lab 08/05/12 2223  APTT 30  INR 0.93   Sepsis markers  Lab 08/05/12 2224  LATICACIDVEN 1.2  PROCALCITON 0.51   Cardiac markers  Lab 08/06/12 1058 08/06/12 0445 08/05/12 2224  CKTOTAL -- -- --  CKMB -- -- --  TROPONINI <0.30 <0.30 <0.30    Intake/Output Summary (Last 24 hours) at 08/10/12 0808 Last data filed at 08/10/12 0700  Gross per 24 hour  Intake 2284.11 ml  Output    335 ml  Net 1949.11 ml   BNP  Lab 08/05/12 2224  PROBNP 59.2   ABG  Lab 08/10/12 0356 08/09/12 1650 08/09/12 1342  PHART 7.333* 7.302* 7.345*  PCO2ART 42.1 49.8* 45.6*  PO2ART 100.0 95.3 46.6*  HCO3 21.4 23.4 23.9  TCO2 22.6 24.8 25.2   CBG trend No results found for this basename: GLUCAP:5 in the last 168 hours  IMAGING:  Dg Chest Port 1 View  08/10/2012  *RADIOLOGY REPORT*  Clinical Data: Endotracheal tube placement  PORTABLE CHEST - 1 VIEW  Comparison: 08/09/2012  Findings: And tracheal tube is appropriately positioned. Nasogastric tube  terminates below the level of the diaphragms but the tip is not included on the film.  Left IJ central line tip terminates over the proximal SVC.  Numerous cardiac leads obscure detail.  Aeration is slightly decreased from previously, with stable patchy airspace opacities bilaterally.  Trace pleural effusions are noted.  IMPRESSION: Hypoaeration with stable patchy airspace opacities as above. Support apparatus as above.   Original Report Authenticated By: Christiana Pellant, M.D.    Dg Chest Port 1 View  08/09/2012  *RADIOLOGY REPORT*  Clinical Data: Evaluate endotracheal tube position, intubation, hypertension  PORTABLE CHEST - 1 VIEW  Comparison: Portable exam 0523 hours compared to 08/08/2012  Findings: Tip of endotracheal tube 4.7 cm above carina. Nasogastric tube extends into stomach. Left jugular line, tip projecting over either the SVC or the left brachiocephalic vein on an exam rotated to the left. Heart size stable. Bilateral pulmonary infiltrates unchanged. No gross pleural effusion or pneumothorax.  IMPRESSION: Persistent pulmonary infiltrates bilaterally.   Original Report Authenticated By: Ulyses Southward, M.D.    Dg Chest Port 1 View  08/08/2012  *RADIOLOGY REPORT*  Clinical Data: Acute change in respiratory status.  Ventilator dependent respiratory failure.  PORTABLE CHEST - 1 VIEW 08/08/2012 1422 hours:  Comparison: Portable chest x-ray earlier same date 0602 hours.  Findings: Endotracheal tube tip remains in satisfactory position projecting approximately 4 cm above carina.  Left jugular central venous catheter tip projects over the upper SVC.  Cardiac silhouette upper normal in size for technique and degree of inspiration, unchanged.  Airspace opacities in both mid and lower lungs, most confluent at the left base, unchanged.  No new pulmonary parenchymal abnormalities.  IMPRESSION: Support apparatus satisfactory.  Stable pneumonia involving both lungs, most confluent at the left lung base.  No new  abnormalities.   Original Report Authenticated By: Hulan Saas, M.D.    Dg Abd Portable 1v  08/08/2012  *RADIOLOGY REPORT*  Clinical Data: Orogastric tube placement.  PORTABLE ABDOMEN - 1 VIEW  Comparison: None.  Findings: Orogastric tube extending into the stomach with its side hole in the proximal stomach.  The tube extends laterally with its tip not included.  Normal bowel gas pattern.  Mild scoliosis, most likely positional.  IMPRESSION: Orogastric tube in the proximal stomach.   Original Report Authenticated By: Beckie Salts, M.D.    ASSESSMENT / PLAN:  PULMONARY  ASSESSMENT: 1) Acute respiratory failure presumably due to influenza pneumonia with bacterial superinfection; intubated on 1/2 on the oscillator with  significant improvement in oxygenation. 1-3 changes to PCV 1-5 decrease peep 14 and check abg  PLAN:   -pos balance again, cvp approached 12, in face of pressors, requires neg balance, add lasix drip -ABG on peep noted, has tolerated peep 14, remain -to goal 60% -Increase I:E ratio  to 3:1 -consider proning position today -re paralyze, start with int vec -no role steroids -with change in ratio, may have some permissive hypercapnia if rate cannot maintain MV, Abg to follow -pcxr in am  Start lasix drip for neg balance  CARDIOVASCULAR  ASSESSMENT:  1) Shock, etiology sepsis, peep? Prior hfoc  PLAN:  - Continue to hold all anti-HTN. - Tele monitor - IVF, kvo - must add lasix drip to neg balance -dopmaine to MAp goals -echo assessment  RENAL Lab Results  Component Value Date   CREATININE 2.43* 08/10/2012   CREATININE 1.43* 08/09/2012   CREATININE 1.29* 08/08/2012    ASSESSMENT:   1) ARF PLAN:   - kvo Lasix drip Chem in pm Renal US  GASTROINTESTINAL  ASSESSMENT:   1) moderately elevated LFT's, uncertain etiology, viral? PLAN:   - LFT's improving 1-4 hep c reactive Start TF, oxepa No BM noted, dulx supp  HEMATOLOGIC  ASSESSMENT:   1)  Leukopenia PLAN:  -F/U CBC in am  Sub q hep  INFECTIOUS  ASSESSMENT:   1) Influenza pneumonia  2) Severe CAP: consider MRSA in addition to usual pathogens given influenza, H1N1 positive. PLAN:   -Vanc, levo Fever, change ceftriaxone to ceftaz -repeat BC, sputum -Oseltamivir high dose  ENDOCRINE  ASSESSMENT:   1) No acute issues PLAN:   -Monitor glucose, add ssi  NEUROLOGIC  ASSESSMENT:   1) refractory hypoxia PLAN:   -verse, fent -unable to do WUA Add vec  CLINICAL SUMMARY: 57 y/o female with acute hypoxemic respiratory failure presumably from viral pneumonia vs severe CAP.  Changed to PCV 1-3 . 1-4 wean dopa. Limited progress, proning, peep to 14, IE ratio increase  Brett Canales Minor ACNP [facility] PCCM Pager [phone] till 3 pm If no answer page [phone] 08/10/2012, 8:08 AM  I have fully examined this patient and agree with above findings.    And edited in full Ccm time 30 min    ,Mcarthur Rossetti. Tyson Alias, MD, FACP Pgr: [phone] [facility]

## 2012-08-10 NOTE — Progress Notes (Signed)
eLink Physician-Brief Progress Note Patient Name: [name] DOB: Jun 27, 1956 MRN: [id number]  Date of Service  08/10/2012   HPI/Events of Note  Camera check after proning On 100%/+16, paralysed - satn 96% TFs off A line reading low, dopamine increased to 15 mcg Cuff higher   eICU Interventions  Change to levophed gtt RPt ABG in 1-2 h   Intervention Category Intermediate Interventions: Hypotension - evaluation and management  ALVA,RAKESH V. 08/10/2012, 7:07 PM

## 2012-08-10 NOTE — Progress Notes (Signed)
ANTIBIOTIC CONSULT NOTE - Follow-up  Pharmacy Consult for Vancomycin + levaquin + ceftazidime Indication: pneumonia  Allergies  Allergen Reactions  . Aspirin Nausea And Vomiting  . Sulfa Antibiotics     nausea    Patient Measurements: Height: 5\' 7"  (170.2 cm) Weight: 372 lb 5.7 oz (168.9 kg) IBW/kg (Calculated) : 61.6  Adjusted Body Weight: 80 kg  Vital Signs: Temp: 100.3 F (37.9 C) (01/05 1151) Temp src: Axillary (01/05 1151) BP: 91/40 mmHg (01/05 1200) Pulse Rate: 85  (01/05 1205)  Labs:  Basename 08/10/12 0455 08/09/12 0400 08/08/12 0445  WBC 6.6 5.3 4.1  HGB 10.6* 11.1* 11.7*  PLT 245 202 203  LABCREA -- -- --  CREATININE 2.43* 1.43* 1.29*   Estimated Creatinine Clearance: 43.2 ml/min (by C-G formula based on Cr of 2.43).  Basename 08/10/12 1225  VANCOTROUGH 27.3*  VANCOPEAK --  VANCORANDOM --  GENTTROUGH --  GENTPEAK --  GENTRANDOM --  TOBRATROUGH --  TOBRAPEAK --  TOBRARND --  AMIKACINPEAK --  AMIKACINTROU --  AMIKACIN --     Microbiology: Recent Results (from the past 720 hour(s))  CULTURE, BLOOD (ROUTINE X 2)     Status: Normal (Preliminary result)   Collection Time   08/05/12  9:30 PM      Component Value Range Status Comment   Specimen Description BLOOD RIGHT ARM   Final    Special Requests BOTTLES DRAWN AEROBIC AND ANAEROBIC 8CC   Final    Culture  Setup Time 08/06/2012 03:41   Final    Culture     Final    Value:        BLOOD CULTURE RECEIVED NO GROWTH TO DATE CULTURE WILL BE HELD FOR 5 DAYS BEFORE ISSUING A FINAL NEGATIVE REPORT   Report Status PENDING   Incomplete   CULTURE, BLOOD (ROUTINE X 2)     Status: Normal (Preliminary result)   Collection Time   08/05/12  9:35 PM      Component Value Range Status Comment   Specimen Description BLOOD RIGHT ARM   Final    Special Requests BOTTLES DRAWN AEROBIC ONLY 10CC   Final    Culture  Setup Time 08/06/2012 03:40   Final    Culture     Final    Value:        BLOOD CULTURE RECEIVED NO  GROWTH TO DATE CULTURE WILL BE HELD FOR 5 DAYS BEFORE ISSUING A FINAL NEGATIVE REPORT   Report Status PENDING   Incomplete   MRSA PCR SCREENING     Status: Normal   Collection Time   08/06/12 12:16 AM      Component Value Range Status Comment   MRSA by PCR NEGATIVE  NEGATIVE Final   URINE CULTURE     Status: Normal   Collection Time   08/06/12  5:00 AM      Component Value Range Status Comment   Specimen Description URINE, RANDOM   Final    Special Requests NONE   Final    Culture  Setup Time 08/06/2012 10:33   Final    Colony Count 5,000 COLONIES/ML   Final    Culture YEAST   Final    Report Status 08/07/2012 FINAL   Final    Assessment: 57 yo female with PNA continues on vancomycin + levaquin for PNA. Scr is now significantly worsening. A vancomycin trough today is elevated at 27.3. Changing ceftriaxone to ceftazidime for broader coverage. Tmax is 101.2 and WBC is WNL. Flu positive,  urine with yeast. Other cultures are negative to date  Goal of Therapy:  Vancomycin trough level 15-20 mcg/ml  Plan:  1. Hold vancomycin - recheck a level with AM labs (unclear direction of Scr as it has fluctuated greatly over the last 2 days) 2. Ceftazidime 1gm IV Q12H 3. Change levaquin to 750mg  IV Q48H 4. F/u renal fxn, C&S, clinical status  Saif Peter, Drake Leach 08/10/2012,2:00 PM

## 2012-08-11 ENCOUNTER — Inpatient Hospital Stay (HOSPITAL_COMMUNITY): Payer: Managed Care, Other (non HMO)

## 2012-08-11 LAB — BLOOD GAS, ARTERIAL
Acid-base deficit: 5 mmol/L — ABNORMAL HIGH (ref 0.0–2.0)
Drawn by: 28337
Drawn by: 35135
Drawn by: 13898
FIO2: 0.4 %
FIO2: 0.7 %
Hertz: 6
MECHVT: 490 mL
Map: 24 cmH20
O2 Saturation: 96.5 %
O2 Saturation: 92.8 %
O2 Saturation: 99.4 %
PEEP: 10 cmH2O
PEEP: 16 cmH2O
PEEP: 16 cmH2O
Patient temperature: 100.4
Patient temperature: 98.6
Pressure control: 20 cmH2O
RATE: 35 resp/min
RATE: 24 resp/min
RATE: 20 resp/min
TCO2: 24.8 mmol/L (ref 0–100)
TCO2: 21 mmol/L (ref 0–100)
pCO2 arterial: 31.7 mmHg — ABNORMAL LOW (ref 35.0–45.0)
pCO2 arterial: 42.1 mmHg (ref 35.0–45.0)
pCO2 arterial: 52.6 mmHg — ABNORMAL HIGH (ref 35.0–45.0)
pH, Arterial: 7.489 — ABNORMAL HIGH (ref 7.350–7.450)
pH, Arterial: 7.333 — ABNORMAL LOW (ref 7.350–7.450)
pH, Arterial: 7.337 — ABNORMAL LOW (ref 7.350–7.450)
pO2, Arterial: 84.7 mmHg (ref 80.0–100.0)
pO2, Arterial: 154 mmHg — ABNORMAL HIGH (ref 80.0–100.0)

## 2012-08-11 LAB — BASIC METABOLIC PANEL
BUN: 80 mg/dL — ABNORMAL HIGH (ref 6–23)
BUN: 86 mg/dL — ABNORMAL HIGH (ref 6–23)
Calcium: 8.4 mg/dL (ref 8.4–10.5)
Calcium: 8.6 mg/dL (ref 8.4–10.5)
Chloride: 109 mEq/L (ref 96–112)
Creatinine, Ser: 3.61 mg/dL — ABNORMAL HIGH (ref 0.50–1.10)
GFR calc Af Amer: 15 mL/min — ABNORMAL LOW (ref >90)
GFR calc Af Amer: 15 mL/min — ABNORMAL LOW (ref >90)
GFR calc non Af Amer: 13 mL/min — ABNORMAL LOW (ref >90)
GFR calc non Af Amer: 13 mL/min — ABNORMAL LOW (ref >90)
Potassium: 4.9 mEq/L (ref 3.5–5.1)
Sodium: 145 mEq/L (ref 135–145)

## 2012-08-11 LAB — POCT I-STAT 3, ART BLOOD GAS (G3+)
Acid-base deficit: 6 mmol/L — ABNORMAL HIGH (ref 0.0–2.0)
Acid-base deficit: 7 mmol/L — ABNORMAL HIGH (ref 0.0–2.0)
Bicarbonate: 24.2 mEq/L — ABNORMAL HIGH (ref 20.0–24.0)
Bicarbonate: 22.6 mEq/L (ref 20.0–24.0)
Bicarbonate: 23 mEq/L (ref 20.0–24.0)
O2 Saturation: 91 %
Patient temperature: 101.6
TCO2: 27 mmol/L (ref 0–100)
TCO2: 25 mmol/L (ref 0–100)
TCO2: 25 mmol/L (ref 0–100)
pCO2 arterial: 59.3 mmHg (ref 35.0–45.0)
pH, Arterial: 7.119 — CL (ref 7.350–7.450)
pH, Arterial: 7.092 — CL (ref 7.350–7.450)
pH, Arterial: 7.196 — CL (ref 7.350–7.450)
pO2, Arterial: 84 mmHg (ref 80.0–100.0)
pO2, Arterial: 134 mmHg — ABNORMAL HIGH (ref 80.0–100.0)

## 2012-08-11 LAB — CBC
HCT: 32.8 % — ABNORMAL LOW (ref 36.0–46.0)
MCH: 27 pg (ref 26.0–34.0)
MCHC: 29.9 g/dL — ABNORMAL LOW (ref 30.0–36.0)
MCV: 90.4 fL (ref 78.0–100.0)
Platelets: 291 10*3/uL (ref 150–400)
RDW: 15.7 % — ABNORMAL HIGH (ref 11.5–15.5)

## 2012-08-11 LAB — GLUCOSE, CAPILLARY
Glucose-Capillary: 120 mg/dL — ABNORMAL HIGH (ref 70–99)
Glucose-Capillary: 126 mg/dL — ABNORMAL HIGH (ref 70–99)

## 2012-08-11 LAB — URINE CULTURE

## 2012-08-11 LAB — TSH: TSH: 0.888 u[IU]/mL (ref 0.350–4.500)

## 2012-08-11 MED ORDER — METOLAZONE 5 MG PO TABS
5.0000 mg | ORAL_TABLET | Freq: Two times a day (BID) | ORAL | Status: DC
  Administered 2012-08-11: 5 mg via ORAL
  Filled 2012-08-11 (×2): qty 1

## 2012-08-11 MED ORDER — ALBUTEROL SULFATE (5 MG/ML) 0.5% IN NEBU: 2.5000 mg | INHALATION_SOLUTION | Freq: Two times a day (BID) | RESPIRATORY_TRACT | Status: DC

## 2012-08-11 MED ORDER — FLUCONAZOLE 40 MG/ML PO SUSR
200.0000 mg | Freq: Every day | ORAL | Status: DC
  Administered 2012-08-11: 200 mg
  Filled 2012-08-11: qty 5

## 2012-08-11 MED ORDER — OSELTAMIVIR PHOSPHATE 6 MG/ML PO SUSR
75.0000 mg | Freq: Every day | ORAL | Status: DC
  Administered 2012-08-11: 75 mg
  Filled 2012-08-11: qty 12.5

## 2012-08-11 MED ORDER — SODIUM CHLORIDE 0.9 % FOR CRRT
INTRAVENOUS_CENTRAL | Status: DC | PRN
  Filled 2012-08-11: qty 1000

## 2012-08-11 MED ORDER — SODIUM BICARBONATE 8.4 % IV SOLN
INTRAVENOUS | Status: DC
  Administered 2012-08-11: 14:00:00 via INTRAVENOUS
  Filled 2012-08-11 (×2): qty 150

## 2012-08-11 MED ORDER — IOHEXOL 300 MG/ML  SOLN
10.0000 mL | Freq: Once | INTRAMUSCULAR | Status: AC | PRN
  Administered 2012-08-11: 10 mL

## 2012-08-11 MED ORDER — VANCOMYCIN HCL 1000 MG IV SOLR
750.0000 mg | INTRAVENOUS | Status: DC
  Administered 2012-08-11: 750 mg via INTRAVENOUS
  Filled 2012-08-11 (×2): qty 750

## 2012-08-11 MED ORDER — HEPARIN SODIUM (PORCINE) 1000 UNIT/ML DIALYSIS
1000.0000 [IU] | INTRAMUSCULAR | Status: DC | PRN
  Administered 2012-08-11: 3000 [IU] via INTRAVENOUS_CENTRAL
  Filled 2012-08-11: qty 1
  Filled 2012-08-11: qty 6

## 2012-08-11 MED ORDER — DEXTROSE 5 % IV SOLN
2.0000 g | INTRAVENOUS | Status: DC
  Filled 2012-08-11: qty 2

## 2012-08-11 MED ORDER — HEPARIN SODIUM (PORCINE) 1000 UNIT/ML IJ SOLN
INTRAMUSCULAR | Status: AC
  Filled 2012-08-11: qty 2

## 2012-08-11 MED ORDER — HEPARIN BOLUS VIA INFUSION (CRRT)
1000.0000 [IU] | INTRAVENOUS | Status: DC | PRN
  Filled 2012-08-11: qty 1000

## 2012-08-11 MED ORDER — PRISMASOL BGK 4/2.5 32-4-2.5 MEQ/L IV SOLN
INTRAVENOUS | Status: DC
  Administered 2012-08-11: 15:00:00 via INTRAVENOUS_CENTRAL
  Filled 2012-08-11 (×3): qty 5000

## 2012-08-11 MED ORDER — ALBUTEROL SULFATE 0.63 MG/3ML IN NEBU: 0.6300 mg | INHALATION_SOLUTION | Freq: Four times a day (QID) | RESPIRATORY_TRACT | Status: DC | PRN

## 2012-08-11 MED ORDER — ACETYLCYSTEINE 20 % IN SOLN
4.0000 mL | Freq: Two times a day (BID) | RESPIRATORY_TRACT | Status: DC
  Filled 2012-08-11 (×2): qty 4

## 2012-08-11 MED ORDER — SODIUM CHLORIDE 0.9 % IJ SOLN
250.0000 [IU]/h | INTRAMUSCULAR | Status: DC
  Administered 2012-08-11: 250 [IU]/h via INTRAVENOUS_CENTRAL
  Filled 2012-08-11: qty 2

## 2012-08-11 MED ORDER — PRISMASOL BGK 4/2.5 32-4-2.5 MEQ/L IV SOLN
INTRAVENOUS | Status: DC
  Administered 2012-08-11: 15:00:00 via INTRAVENOUS_CENTRAL
  Filled 2012-08-11 (×8): qty 5000

## 2012-08-11 MED ORDER — BISACODYL 10 MG RE SUPP
10.0000 mg | Freq: Once | RECTAL | Status: AC
  Administered 2012-08-11: 10 mg via RECTAL

## 2012-08-11 MED ORDER — SODIUM POLYSTYRENE SULFONATE 15 GM/60ML PO SUSP
30.0000 g | Freq: Once | ORAL | Status: AC
  Administered 2012-08-11: 30 g via ORAL
  Filled 2012-08-11: qty 120

## 2012-08-11 MED ORDER — PRISMASOL BGK 4/2.5 32-4-2.5 MEQ/L IV SOLN
INTRAVENOUS | Status: DC
  Administered 2012-08-11: 15:00:00 via INTRAVENOUS_CENTRAL
  Filled 2012-08-11 (×2): qty 5000

## 2012-08-11 MED ORDER — ALBUTEROL SULFATE 0.63 MG/3ML IN NEBU: 0.6300 mg | INHALATION_SOLUTION | Freq: Two times a day (BID) | RESPIRATORY_TRACT | Status: DC

## 2012-08-11 NOTE — Progress Notes (Signed)
Extensive management on vent PCV throughout dat. Multiple updates family. Extensive d/w [facility] hospital intensivist and ECMO team. Family aware of risk death , hypoxia, hypoventialtion on transport. They wish Tx to [facility] for ecmo evaluation. Last ABG improved ph, able to reduce to 50%. Will transfer now helicopter. D/w Brother, husband. Additional CCm time 55 min  Mcarthur Rossetti. Tyson Alias, MD, FACP Pgr: [phone] [facility]

## 2012-08-11 NOTE — Progress Notes (Signed)
Respiratory acidosis; most likely due to autopeep   Decreased respiratory rate

## 2012-08-11 NOTE — Progress Notes (Signed)
F/U ABG improved   Slight increase in rate ordered   F/u abg in 1 hour

## 2012-08-11 NOTE — Discharge Summary (Signed)
Internal Medicine Teaching [facility] Discharge Note  Name: [name] MRN: [id number] DOB: June 07, 1956 57 y.o.  Date of Admission: 08/05/2012  7:44 PM Date of Discharge: 08/11/2012 Attending Physician: Rory Percy , MD  Discharge Diagnosis: Principal Problem: Acute respiratory failure with hypoxic/ hypercapnic resp failure  H1N1 CAP (community acquired pneumonia)  AKI (acute kidney injury) - on CVVHD  Hypertension  Meds infusions     . sodium chloride 20 mL/hr at 08/10/12 0700  . cisatracurium (NIMBEX) infusion 4 mcg/kg/min (08/10/12 1630)  . feeding supplement (OXEPA) 1,000 mL (08/09/12 1530)  . fentaNYL infusion INTRAVENOUS 150 mcg/hr (08/10/12 1600)  . heparin 10,000 units/ 20 mL infusion syringe 250 Units/hr (08/11/12 1520)  . midazolam (VERSED) infusion 6 mg/hr (08/11/12 0428)  . norepinephrine (LEVOPHED) Adult infusion 6 mcg/min (08/11/12 1100)  . dialysis replacement fluid (prismasate) 300 mL/hr at 08/11/12 1519  . dialysis replacement fluid (prismasate) 200 mL/hr at 08/11/12 1519  . dialysate (PRISMASATE) 2,000 mL/hr at 08/11/12 1519  .  sodium bicarbonate infusion 1000 mL 75 mL/hr at 08/11/12 1347    Meds scheduled     . acetylcysteine  4 mL Nebulization Q12H  . albuterol  2.5 mg Nebulization BID  . antiseptic oral rinse  15 mL Mouth Rinse QID  . cefTAZidime (FORTAZ)  IV  2 g Intravenous Q24H  . chlorhexidine  15 mL Mouth Rinse BID  . feeding supplement  60 mL Per Tube TID  . fluconazole  200 mg Per Tube Daily  . heparin      . heparin  5,000 Units Subcutaneous Q8H  . insulin aspart  0-9 Units Subcutaneous Q4H  . levofloxacin (LEVAQUIN) IV  750 mg Intravenous Q48H  . metolazone  5 mg Oral Q12H  . oseltamivir  75 mg Per Tube Daily  . pantoprazole sodium  40 mg Per Tube Daily  . vancomycin  750 mg Intravenous Q24H   Med prn   acetaminophen (TYLENOL) oral liquid 160 mg/5 mL, albuterol, bisacodyl, fentaNYL, heparin, heparin, ipratropium, midazolam,  ondansetron, sodium chloride  Meds d/c      Current Outpatient Prescriptions on File Prior to Encounter  Medication Sig Dispense Refill  . albuterol (PROVENTIL HFA;VENTOLIN HFA) 108 (90 BASE) MCG/ACT inhaler Inhale 2 puffs into the lungs every 6 (six) hours as needed. For shortness of breath      . lisinopril (PRINIVIL,ZESTRIL) 10 MG tablet Take 10 mg by mouth daily.      . medroxyPROGESTERone (PROVERA) 10 MG tablet Take 10 mg by mouth daily.        Disposition and follow-up:   Ms.[name] was discharged from [facility] per Helicopter to [facility] for further evaluation and management and possible ECMO.  Patient needs continues CVVHD.  Discussed with Family ( Brother and husband ) about risk and benefits for transfer      Consultations: Treatment Team:  Garnetta Buddy, MD  Procedures Performed:   1. ET tube placed 08/08/11 2. Left IJ TLC 08/08/11 3. Left femoral A-line 08/08/11 4. Central Venous Catheter Insertion - HD cath 08/12/11    Dg Chest 2 View  08/05/2012  *RADIOLOGY REPORT*  Clinical Data: Fever, cough and weakness.  CHEST - 2 VIEW  Comparison: None.  Findings: The lungs are well expanded.  Diffuse bilateral airspace opacification is noted, with lobar consolidation involving the right upper lobe, compatible with extensive diffuse pneumonia.  No pleural effusion or pneumothorax is seen.  The heart is normal in size; the mediastinal contour is  within normal limits.  No acute osseous abnormalities are seen.  IMPRESSION: Diffuse bilateral pneumonia noted, with lobar consolidation involving the right upper lobe.   Original Report Authenticated By: Tonia Ghent, M.D.    Dg Abd 1 View  08/11/2012  *RADIOLOGY REPORT*  Clinical Data: Feeding tube placement under fluoro.  ABDOMEN - 1 VIEW  Comparison: 08/10/2012  Findings: Feeding tube was placed using fluoroscopic guidance by the radiology technologist.  10 ml of Omnipaque-300 was injected verify placement.  The  tip is in the proximal jejunum.  IMPRESSION: Feeding tube tip in the proximal jejunum.   Original Report Authenticated By: Charlett Nose, M.D.    US Renal Port  08/10/2012  *RADIOLOGY REPORT*  Clinical Data: Acute renal failure  RENAL/URINARY TRACT ULTRASOUND COMPLETE  Comparison:  None.  Findings:  Right Kidney:  13.4 cm. No hydronephrosis or focal mass.  Left Kidney:  12.9 cm. No hydronephrosis or focal mass.  Extremely suboptimal visualization, especially the left kidney, due to patient body habitus.  Bladder:  Bladder is decompressed with a Foley catheter in place.  IMPRESSION: No gross evidence for hydronephrosis or focal abnormality.   Original Report Authenticated By: Christiana Pellant, M.D.    Dg Chest Port 1 View  08/11/2012  *RADIOLOGY REPORT*  Clinical Data: New hemodialysis catheter placement.  PORTABLE CHEST - 1 VIEW  Comparison: Chest x-ray 08/11/2012.  Findings: New right IJ vas cath with tip terminating in the distal superior vena cava. There is a left-sided internal jugular central venous catheter with tip terminating in the proximal superior vena cava. A nasogastric tube is seen extending into the stomach, however, the tip of the nasogastric tube extends below the lower margin of the image. An endotracheal tube is in place with tip 4.5 cm above the carina. Lung volumes remain low.  There is widespread interstitial and airspace disease throughout the lungs bilaterally, favored to represent moderate - severe pulmonary edema (however, aeration has slightly improved compared to the prior examination). No pneumothorax.  No definite pleural effusions.  Heart size is mildly enlarged.  Mediastinal contours are unremarkable.  IMPRESSION: 1.  Support apparatus, as above. 2.  No pneumothorax following placement of new hemodialysis catheter. 3.  Findings suggest moderate to severe pulmonary edema, however, overall aeration has improved slightly compared to the earlier examination. Superimposed multilobar  pneumonia is difficult to entirely exclude.   Original Report Authenticated By: Trudie Reed, M.D.    Dg Chest Port 1 View  08/11/2012  *RADIOLOGY REPORT*  Clinical Data: Check endotracheal tube position.  PORTABLE CHEST - 1 VIEW  Comparison: 08/10/2012.  Findings: Endotracheal tube terminates approximately 3.6 cm above the carina.  Nasogastric tube is followed into the stomach.  Left IJ central line tip projects near the brachiocephalic vein junction.  Heart size is grossly stable.  Worsening diffuse bilateral air space disease.  No definite pleural fluid.  IMPRESSION: Worsening diffuse bilateral air space disease may be due to edema or pneumonia.   Original Report Authenticated By: Leanna Battles, M.D.    Dg Chest Port 1 View  08/10/2012  *RADIOLOGY REPORT*  Clinical Data: Endotracheal tube placement  PORTABLE CHEST - 1 VIEW  Comparison: 08/09/2012  Findings: And tracheal tube is appropriately positioned. Nasogastric tube terminates below the level of the diaphragms but the tip is not included on the film.  Left IJ central line tip terminates over the proximal SVC.  Numerous cardiac leads obscure detail.  Aeration is slightly decreased from previously, with stable patchy airspace opacities bilaterally.  Trace pleural effusions are noted.  IMPRESSION: Hypoaeration with stable patchy airspace opacities as above. Support apparatus as above.   Original Report Authenticated By: Christiana Pellant, M.D.    Dg Chest Port 1 View  08/09/2012  *RADIOLOGY REPORT*  Clinical Data: Evaluate endotracheal tube position, intubation, hypertension  PORTABLE CHEST - 1 VIEW  Comparison: Portable exam 0523 hours compared to 08/08/2012  Findings: Tip of endotracheal tube 4.7 cm above carina. Nasogastric tube extends into stomach. Left jugular line, tip projecting over either the SVC or the left brachiocephalic vein on an exam rotated to the left. Heart size stable. Bilateral pulmonary infiltrates unchanged. No gross pleural  effusion or pneumothorax.  IMPRESSION: Persistent pulmonary infiltrates bilaterally.   Original Report Authenticated By: Ulyses Southward, M.D.    Dg Chest Port 1 View  08/08/2012  *RADIOLOGY REPORT*  Clinical Data: Acute change in respiratory status.  Ventilator dependent respiratory failure.  PORTABLE CHEST - 1 VIEW 08/08/2012 1422 hours:  Comparison: Portable chest x-ray earlier same date 0602 hours.  Findings: Endotracheal tube tip remains in satisfactory position projecting approximately 4 cm above carina.  Left jugular central venous catheter tip projects over the upper SVC.  Cardiac silhouette upper normal in size for technique and degree of inspiration, unchanged.  Airspace opacities in both mid and lower lungs, most confluent at the left base, unchanged.  No new pulmonary parenchymal abnormalities.  IMPRESSION: Support apparatus satisfactory.  Stable pneumonia involving both lungs, most confluent at the left lung base.  No new abnormalities.   Original Report Authenticated By: Hulan Saas, M.D.    Dg Chest Port 1 View  08/08/2012  *RADIOLOGY REPORT*  Clinical Data: Evaluate endotracheal tube position  PORTABLE CHEST - 1 VIEW  Comparison: Portable chest x-ray of 08/07/2012  Findings: The tip of the endotracheal tube is approximately 4.2 cm above the carina.  Aeration has not changed significantly and there is little change and patchy airspace disease bilaterally.  Mild cardiomegaly is stable.  The left central venous line tip overlies the left innominate vein region.  IMPRESSION:  1.  Endotracheal tube tip 4.2 cm above the carina. 2.  No change in aeration and patchy airspace disease.   Original Report Authenticated By: Dwyane Dee, M.D.    Chest Portable 1 View  08/07/2012  *RADIOLOGY REPORT*  Clinical Data: Flu related pneumonia.  PORTABLE CHEST - 1 VIEW at 5:40 p.m.  Comparison: 08/07/2012 at 11:41 a.m. and 08/06/2012  Findings: The tracheal tube and central venous catheter in good position.  There has  been improvement in the bilateral pulmonary infiltrates.  No effusions.  IMPRESSION: Improved bilateral pulmonary infiltrates.   Original Report Authenticated By: Francene Boyers, M.D.    Dg Chest Port 1 View  08/07/2012  *RADIOLOGY REPORT*  Clinical Data: Endotracheal tube and central venous line placement.  PORTABLE CHEST - 1 VIEW  Comparison: 08/07/2012  Findings: An endotracheal tube is noted with tip 3.7 cm above the carina. A left IJ central venous catheter is identified with tip overlying the mid - upper SVC. Bilateral airspace opacities are unchanged. There is no evidence of pneumothorax.  IMPRESSION: Endotracheal tube placement with tip 3.7 cm above the carina.  Left IJ central venous catheter with tip overlying the mid - upper SVC. No evidence of pneumothorax.  Unchanged bilateral airspace opacities.   Original Report Authenticated By: Harmon Pier, M.D.    Dg Chest Port 1 View  08/07/2012  *RADIOLOGY REPORT*  Clinical Data: Pneumonia.  PORTABLE CHEST - 1 VIEW  Comparison: 08/06/2012.  Findings: Persistent diffuse asymmetric airspace process with prominent air bronchograms on the left.  Findings consistent with bilateral pneumonia, left greater than right.  Slight improved aeration since yesterday's film.  No large pleural effusion or pneumothorax.  IMPRESSION: Persistent but slightly improved bilateral airspace process.   Original Report Authenticated By: Rudie Meyer, M.D.    Portable Chest Xray In Am  08/06/2012  *RADIOLOGY REPORT*  Clinical Data: ARDS  PORTABLE CHEST - 1 VIEW  Comparison: 08/05/2012  Findings: 0635 hours.  Asymmetric airspace disease, left greater than right, persist without substantial change.  Heart size is stable. Telemetry leads overlie the chest.  IMPRESSION: Stable exam.   Original Report Authenticated By: Kennith Center, M.D.    Dg Abd Portable 1v  08/10/2012  *RADIOLOGY REPORT*  Clinical Data: Abdominal distention.  Evaluate possible ileus.  PORTABLE ABDOMEN - 1 VIEW  08/10/2012 1656 hours:  Comparison: Portable abdomen x-ray 08/08/2012.  Findings: Nasogastric tube tip in the proximal body the stomach. Bowel gas pattern unremarkable without evidence of obstruction or significant ileus.  No suggestion of free air on the supine image. Phleboliths in the pelvis.  No visible opaque urinary tract calculi.  Degenerative changes involving the lower lumbar spine. Airspace consolidation with air bronchograms in the visualized left lung base.  IMPRESSION: No acute abdominal abnormality.  Nasogastric tube tip in the proximal body of the stomach.   Original Report Authenticated By: Hulan Saas, M.D.    Dg Abd Portable 1v  08/08/2012  *RADIOLOGY REPORT*  Clinical Data: Orogastric tube placement.  PORTABLE ABDOMEN - 1 VIEW  Comparison: None.  Findings: Orogastric tube extending into the stomach with its side hole in the proximal stomach.  The tube extends laterally with its tip not included.  Normal bowel gas pattern.  Mild scoliosis, most likely positional.  IMPRESSION: Orogastric tube in the proximal stomach.   Original Report Authenticated By: Beckie Salts, M.D.       Admission HPI:  57 y/o female with Hypertension presented to the [facility] ED on 12/31 with hypoxemic respiratory failure presumably from H1N1 pneumonia. She has noted fevers, chills, nausea, poor po intake and a dry cough. Her grandson was diagnosed with influenza on 12/27 and she has been sick since then. She was treated with 5 days of tamiflu prior to admission, but she was not tested for influenza. Her husband brought her to the ED on 12/31 because of worsening shortness of breath. In the ED she was started on BIPAP.   Hospital Course by problem list:   1.  Acute respiratory failure presumably due to influenza pneumonia with bacterial superinfection; intubated on 1/2 on the oscillator with significant improvement in oxygenation. 1-3 changed to ARDS conventional ventilation/ PC  1/5 Transfer to 2100, placed in  prone position ( total 12 h) and paralyzed ( Nimbex) . Patient was further started on lasix drip. Never received steroids . Patient was further noted to have a collapsed RLL . Was given mucomysts and albuterol bid . Currently on Fortaz  Started on 1/5 ( changed from Ceftriaxone) , Vancomycin ( since 1/1), Levaquin ( since 1/1), Tamiflu ( since 12/31). Blood culture 12/31> no growth up to date. 12/31 influenza by pcr >>H1N1 positive,  12/31 urine >>5000 yeast , 12/31 urine strep  >>NTD 12/31,  urine legionella  >>NTD,  1/4 urine >> 100.000 yeast,  1/5 blood >>> pending on day of discharge    2. Shock, etiology sepsis, peep? sedation . Patient was started on Dopamine (initially  brady on hfoc) but was d/c due to tachycardia and was started on Levophed. Initally required 15 mcg but was nitrated down to 2 mcg this am and since stable . Echo was ordered but pending prior to discharge.  Currently on Fortaz  Started on 1/5 ( changed from Ceftriaxone) , Vancomycin ( since 1/1), Levaquin ( since 1/1), Tamiflu ( since 12/31). Blood culture 12/31> no growth up to date. 12/31 influenza by pcr >>H1N1 positive,  12/31 urine >>5000 yeast , 12/31 urine strep  >>NTD 12/31,  urine legionella  >>NTD,  1/4 urine >> 100.000 yeast,  1/5 blood >>> pending on day of discharge. Started on Diflucan for yeast in Urine     3. Acute renal failure : Renal ultrasound 1/5: No gross evidence for hydronephrosis or focal abnormality.  Was started on Lasix drip. Patient had a total of 1,185 cc in the last 30 h of urine output but continues to be fluid overloaded. Nephrology was consulted and CVVHD was recommended but not started prior to discharge. Kayexelate was given x1   Lab Results   Component  Value  Date    CREATININE  2.43*  08/10/2012    CREATININE  1.43*  08/09/2012    CREATININE  1.29*  08/08/2012      4. Moderately elevated LFT's, uncertain etiology, Shock ?  LFT improving.  1-4 hep c reactive, assess viral load, genotype  Pending  prior to discharge. Was started on  1/5 TF, oxepa, held for prone. Was given  dulx supp x2.     5. Hyperglycemia : Started on SSI   6. DVT : Heparin SQ  7. GI :  Protonix     Discharge Vitals:  BP 107/44  Pulse 75  Temp 98.2 F (36.8 C) (Oral)  Resp 30  Ht 5\' 6"  (1.676 m)  Wt 372 lb 5.7 oz (168.9 kg)  BMI 60.10 kg/m2  SpO2 99%  Discharge Labs:  Results for orders placed during the hospital encounter of 08/05/12 (from the past 24 hour(s))  GLUCOSE, CAPILLARY     Status: Abnormal   Collection Time   08/10/12  5:16 PM      Component Value Range   Glucose-Capillary 106 (*) 70 - 99 mg/dL  BASIC METABOLIC PANEL     Status: Abnormal   Collection Time   08/10/12  6:00 PM      Component Value Range   Sodium 143  135 - 145 mEq/L   Potassium 5.1  3.5 - 5.1 mEq/L   Chloride 110  96 - 112 mEq/L   CO2 24  19 - 32 mEq/L   Glucose, Bld 112 (*) 70 - 99 mg/dL   BUN 74 (*) 6 - 23 mg/dL   Creatinine, Ser 1.61 (*) 0.50 - 1.10 mg/dL   Calcium 8.7  8.4 - 09.6 mg/dL   GFR calc non Af Amer 15 (*) >90 mL/min   GFR calc Af Amer 18 (*) >90 mL/min  MAGNESIUM     Status: Normal   Collection Time   08/10/12  6:00 PM      Component Value Range   Magnesium 2.4  1.5 - 2.5 mg/dL  GLUCOSE, CAPILLARY     Status: Abnormal   Collection Time   08/10/12  6:40 PM      Component Value Range   Glucose-Capillary 111 (*) 70 - 99 mg/dL  GLUCOSE, CAPILLARY     Status: Abnormal   Collection Time   08/10/12  8:16 PM  Component Value Range   Glucose-Capillary 120 (*) 70 - 99 mg/dL  POCT I-STAT 3, BLOOD GAS (G3+)     Status: Abnormal   Collection Time   08/10/12  8:24 PM      Component Value Range   pH, Arterial 7.100 (*) 7.350 - 7.450   pCO2 arterial 81.4 (*) 35.0 - 45.0 mmHg   pO2, Arterial 273.0 (*) 80.0 - 100.0 mmHg   Bicarbonate 25.0 (*) 20.0 - 24.0 mEq/L   TCO2 27  0 - 100 mmol/L   O2 Saturation 100.0     Acid-base deficit 6.0 (*) 0.0 - 2.0 mmol/L   Patient temperature 99.8 F     Collection site  FEMORAL ARTERY     Sample type ARTERIAL     Comment VALUES EXPECTED, NO REPEAT    POCT I-STAT 3, BLOOD GAS (G3+)     Status: Abnormal   Collection Time   08/10/12 10:11 PM      Component Value Range   pH, Arterial 7.216 (*) 7.350 - 7.450   pCO2 arterial 53.2 (*) 35.0 - 45.0 mmHg   pO2, Arterial 91.0  80.0 - 100.0 mmHg   Bicarbonate 21.4  20.0 - 24.0 mEq/L   TCO2 23  0 - 100 mmol/L   O2 Saturation 94.0     Acid-base deficit 6.0 (*) 0.0 - 2.0 mmol/L   Patient temperature 99.8 F     Collection site FEMORAL ARTERY     Sample type ARTERIAL    GLUCOSE, CAPILLARY     Status: Abnormal   Collection Time   08/11/12 12:26 AM      Component Value Range   Glucose-Capillary 126 (*) 70 - 99 mg/dL   Comment 1 Notify RN    POCT I-STAT 3, BLOOD GAS (G3+)     Status: Abnormal   Collection Time   08/11/12  4:15 AM      Component Value Range   pH, Arterial 7.067 (*) 7.350 - 7.450   pCO2 arterial 86.1 (*) 35.0 - 45.0 mmHg   pO2, Arterial 89.0  80.0 - 100.0 mmHg   Bicarbonate 24.2 (*) 20.0 - 24.0 mEq/L   TCO2 27  0 - 100 mmol/L   O2 Saturation 89.0     Acid-base deficit 6.0 (*) 0.0 - 2.0 mmol/L   Patient temperature 101.6 F     Collection site FEMORAL ARTERY     Sample type ARTERIAL    GLUCOSE, CAPILLARY     Status: Abnormal   Collection Time   08/11/12  4:21 AM      Component Value Range   Glucose-Capillary 122 (*) 70 - 99 mg/dL   Comment 1 Notify RN    CBC     Status: Abnormal   Collection Time   08/11/12  4:26 AM      Component Value Range   WBC 8.6  4.0 - 10.5 K/uL   RBC 3.63 (*) 3.87 - 5.11 MIL/uL   Hemoglobin 9.8 (*) 12.0 - 15.0 g/dL   HCT 16.1 (*) 09.6 - 04.5 %   MCV 90.4  78.0 - 100.0 fL   MCH 27.0  26.0 - 34.0 pg   MCHC 29.9 (*) 30.0 - 36.0 g/dL   RDW 40.9 (*) 81.1 - 91.4 %   Platelets 291  150 - 400 K/uL  BASIC METABOLIC PANEL     Status: Abnormal   Collection Time   08/11/12  4:26 AM      Component Value Range   Sodium  141  135 - 145 mEq/L   Potassium 5.3 (*) 3.5 - 5.1 mEq/L    Chloride 109  96 - 112 mEq/L   CO2 24  19 - 32 mEq/L   Glucose, Bld 126 (*) 70 - 99 mg/dL   BUN 80 (*) 6 - 23 mg/dL   Creatinine, Ser 1.61 (*) 0.50 - 1.10 mg/dL   Calcium 8.4  8.4 - 09.6 mg/dL   GFR calc non Af Amer 13 (*) >90 mL/min   GFR calc Af Amer 15 (*) >90 mL/min  PHOSPHORUS     Status: Abnormal   Collection Time   08/11/12  4:26 AM      Component Value Range   Phosphorus 6.7 (*) 2.3 - 4.6 mg/dL  MAGNESIUM     Status: Normal   Collection Time   08/11/12  4:26 AM      Component Value Range   Magnesium 2.5  1.5 - 2.5 mg/dL  VANCOMYCIN, RANDOM     Status: Normal   Collection Time   08/11/12  4:26 AM      Component Value Range   Vancomycin Rm 22.1    BLOOD GAS, ARTERIAL     Status: Abnormal   Collection Time   08/11/12  5:30 AM      Component Value Range   FIO2 0.60     Delivery systems VENTILATOR     Mode PRESSURE CONTROL     Rate 20     Peep/cpap 16.0     Pressure control 20     pH, Arterial 7.226 (*) 7.350 - 7.450   pCO2 arterial 52.6 (*) 35.0 - 45.0 mmHg   pO2, Arterial 154.0 (*) 80.0 - 100.0 mmHg   Bicarbonate 21.0  20.0 - 24.0 mEq/L   TCO2 22.6  0 - 100 mmol/L   Acid-base deficit 5.4 (*) 0.0 - 2.0 mmol/L   O2 Saturation 99.4     Patient temperature 98.6     Collection site A-LINE     Drawn by [phone]     Sample type ARTERIAL DRAW    GLUCOSE, CAPILLARY     Status: Abnormal   Collection Time   08/11/12  8:12 AM      Component Value Range   Glucose-Capillary 113 (*) 70 - 99 mg/dL  POCT I-STAT 3, BLOOD GAS (G3+)     Status: Abnormal   Collection Time   08/11/12  9:36 AM      Component Value Range   pH, Arterial 7.119 (*) 7.350 - 7.450   pCO2 arterial 69.8 (*) 35.0 - 45.0 mmHg   pO2, Arterial 82.0  80.0 - 100.0 mmHg   Bicarbonate 22.6  20.0 - 24.0 mEq/L   TCO2 25  0 - 100 mmol/L   O2 Saturation 91.0     Acid-base deficit 7.0 (*) 0.0 - 2.0 mmol/L   Collection site ARTERIAL LINE     Drawn by Operator     Sample type ARTERIAL     Comment NOTIFIED PHYSICIAN    BASIC  METABOLIC PANEL     Status: Abnormal   Collection Time   08/11/12 10:10 AM      Component Value Range   Sodium 145  135 - 145 mEq/L   Potassium 4.9  3.5 - 5.1 mEq/L   Chloride 111  96 - 112 mEq/L   CO2 24  19 - 32 mEq/L   Glucose, Bld 122 (*) 70 - 99 mg/dL   BUN 86 (*) 6 - 23 mg/dL  Creatinine, Ser 3.68 (*) 0.50 - 1.10 mg/dL   Calcium 8.6  8.4 - 11.9 mg/dL   GFR calc non Af Amer 13 (*) >90 mL/min   GFR calc Af Amer 15 (*) >90 mL/min  TSH     Status: Normal   Collection Time   08/11/12 10:10 AM      Component Value Range   TSH 0.888  0.350 - 4.500 uIU/mL  GLUCOSE, CAPILLARY     Status: Abnormal   Collection Time   08/11/12 11:52 AM      Component Value Range   Glucose-Capillary 120 (*) 70 - 99 mg/dL  POCT I-STAT 3, BLOOD GAS (G3+)     Status: Abnormal   Collection Time   08/11/12 12:45 PM      Component Value Range   pH, Arterial 7.092 (*) 7.350 - 7.450   pCO2 arterial 75.3 (*) 35.0 - 45.0 mmHg   pO2, Arterial 84.0  80.0 - 100.0 mmHg   Bicarbonate 23.0  20.0 - 24.0 mEq/L   TCO2 25  0 - 100 mmol/L   O2 Saturation 91.0     Acid-base deficit 7.0 (*) 0.0 - 2.0 mmol/L   Patient temperature 37.0 C     Collection site RADIAL, ALLEN'S TEST ACCEPTABLE     Drawn by Operator     Sample type ARTERIAL     Comment NOTIFIED PHYSICIAN    POCT I-STAT 3, BLOOD GAS (G3+)     Status: Abnormal   Collection Time   08/11/12  2:15 PM      Component Value Range   pH, Arterial 7.196 (*) 7.350 - 7.450   pCO2 arterial 59.3 (*) 35.0 - 45.0 mmHg   pO2, Arterial 134.0 (*) 80.0 - 100.0 mmHg   Bicarbonate 23.0  20.0 - 24.0 mEq/L   TCO2 25  0 - 100 mmol/L   O2 Saturation 98.0     Acid-base deficit 5.0 (*) 0.0 - 2.0 mmol/L   Patient temperature 37.0 C     Collection site ARTERIAL LINE     Drawn by RT     Sample type ARTERIAL     Comment NOTIFIED PHYSICIAN        Time Spent on Discharge:  1 hour   Agree with above Extensive d/w [facility] INtensivist and ECMO team. For transfer for evaluation. Family  understood risks/benefit  Mcarthur Rossetti. Tyson Alias, MD, FACP Pgr: [phone] [facility]

## 2012-08-11 NOTE — Procedures (Signed)
Central Venous Catheter Insertion Procedure Note - hd Tracie Dore [id number] 04/01/1956  Procedure: Insertion of Central Venous Catheter Indications: hd  Procedure Details Consent: Risks of procedure as well as the alternatives and risks of each were explained to the (patient/caregiver).  Consent for procedure obtained. Time Out: Verified patient identification, verified procedure, site/side was marked, verified correct patient position, special equipment/implants available, medications/allergies/relevent history reviewed, required imaging and test results available.  Performed  Maximum sterile technique was used including antiseptics, cap, gloves, gown, hand hygiene, mask and sheet. Skin prep: Chlorhexidine; local anesthetic administered A antimicrobial bonded/coated triple lumen catheter was placed in the right internal jugular vein using the Seldinger technique.  Evaluation Blood flow good Complications: No apparent complications Patient did tolerate procedure well. Chest X-ray ordered to verify placement.  CXR: pending.  Nelda Bucks 08/11/2012, 11:51 AM  [place] gudiance see image With resident Eustaquio Maize, MD  Mcarthur Rossetti. Tyson Alias, MD, FACP Pgr: [phone] [facility]

## 2012-08-11 NOTE — Progress Notes (Addendum)
ANTIBIOTIC CONSULT NOTE - Follow-up  Addendum: Elita Quick has been changed to 2g IV q24h due to worsening renal function   Thank you, [employer], Pharm.D. Clinical Pharmacist   Pager: [phone] Phone: [phone] 08/11/2012 10:26 AM  ++++++++++++++++++++++++++++++++++++++++++ Pharmacy Consult for Vancomycin + levaquin + ceftazidime Indication: pneumonia  Allergies  Allergen Reactions  . Aspirin Nausea And Vomiting  . Sulfa Antibiotics     nausea    Patient Measurements: Height: 5\' 6"  (167.6 cm) Weight: 372 lb 5.7 oz (168.9 kg) IBW/kg (Calculated) : 59.3  Adjusted Body Weight: 80 kg  Vital Signs: Temp: 98.7 F (37.1 C) (01/06 0409) Temp src: Oral (01/06 0409) BP: 102/51 mmHg (01/06 0500) Pulse Rate: 72  (01/06 0500)  Labs:  Basename 08/11/12 0426 08/10/12 1800 08/10/12 0455 08/09/12 0400  WBC 8.6 -- 6.6 5.3  HGB 9.8* -- 10.6* 11.1*  PLT 291 -- 245 202  LABCREA -- -- -- --  CREATININE 3.61* 3.20* 2.43* --   Estimated Creatinine Clearance: 28.7 ml/min (by C-G formula based on Cr of 3.61).  Basename 08/11/12 0426 08/10/12 1225  VANCOTROUGH -- 27.3*  VANCOPEAK -- --  Drue Dun 22.1 --  GENTTROUGH -- --  GENTPEAK -- --  GENTRANDOM -- --  TOBRATROUGH -- --  TOBRAPEAK -- --  TOBRARND -- --  AMIKACINPEAK -- --  AMIKACINTROU -- --  AMIKACIN -- --     Microbiology: Recent Results (from the past 720 hour(s))  CULTURE, BLOOD (ROUTINE X 2)     Status: Normal (Preliminary result)   Collection Time   08/05/12  9:30 PM      Component Value Range Status Comment   Specimen Description BLOOD RIGHT ARM   Final    Special Requests BOTTLES DRAWN AEROBIC AND ANAEROBIC 8CC   Final    Culture  Setup Time 08/06/2012 03:41   Final    Culture     Final    Value:        BLOOD CULTURE RECEIVED NO GROWTH TO DATE CULTURE WILL BE HELD FOR 5 DAYS BEFORE ISSUING A FINAL NEGATIVE REPORT   Report Status PENDING   Incomplete   CULTURE, BLOOD (ROUTINE X 2)     Status: Normal  (Preliminary result)   Collection Time   08/05/12  9:35 PM      Component Value Range Status Comment   Specimen Description BLOOD RIGHT ARM   Final    Special Requests BOTTLES DRAWN AEROBIC ONLY 10CC   Final    Culture  Setup Time 08/06/2012 03:40   Final    Culture     Final    Value:        BLOOD CULTURE RECEIVED NO GROWTH TO DATE CULTURE WILL BE HELD FOR 5 DAYS BEFORE ISSUING A FINAL NEGATIVE REPORT   Report Status PENDING   Incomplete   MRSA PCR SCREENING     Status: Normal   Collection Time   08/06/12 12:16 AM      Component Value Range Status Comment   MRSA by PCR NEGATIVE  NEGATIVE Final   URINE CULTURE     Status: Normal   Collection Time   08/06/12  5:00 AM      Component Value Range Status Comment   Specimen Description URINE, RANDOM   Final    Special Requests NONE   Final    Culture  Setup Time 08/06/2012 10:33   Final    Colony Count 5,000 COLONIES/ML   Final    Culture YEAST   Final  Report Status 08/07/2012 FINAL   Final   URINE CULTURE     Status: Normal   Collection Time   08/09/12  9:15 PM      Component Value Range Status Comment   Specimen Description URINE, CATHETERIZED   Final    Special Requests A   Final    Culture  Setup Time 08/10/2012 01:27   Final    Colony Count >=100,000 COLONIES/ML   Final    Culture YEAST   Final    Report Status 08/11/2012 FINAL   Final    Assessment: 56 yo female with PNA continues on vancomycin + levaquin for PNA. Scr is now significantly worsening 1.3>3.6. A vancomycin trough 1/5 was elevated at 27.3. Tmax is 101.2 and WBC is WNL. Flu positive, urine with yeast. Other cultures are negative to date F/U random Vancomycin trough 22 - at upper limit of range - drawn 30hr after last dose given.   Goal of Therapy:  Vancomycin trough level 15-20 mcg/ml  Plan:  Restart vancomycin 750mg  q24 - adjsut as needed for changing renal fx   Marcelino Scot 08/11/2012,5:42 AM

## 2012-08-11 NOTE — Progress Notes (Signed)
Name: [name] MRN: [id number] DOB: 12/11/1955 LOS: 6  PCCM RESIDENT DAILY PROGRESS NOTE  History of Present Illness:  57 y/o female with Hypertension presented to the [facility] ED on 12/31 with hypoxemic respiratory failure presumably from H1N1 pneumonia.  LINES / TUBES:  ET tube 1/2 >>  L IJ TLC 1/2 >>  L femoral a-line 1/2 >>   CULTURES:  12/31 blood >>NTD  12/31 influenza by pcr >>H1N1 positive  12/31 urine >>5000 yeast  12/31 urine st >>NTD  12/31 urine leg >>NTD  1/4 urine >> 100.000 yeast  1/5 blood >>>  ANTIBIOTICS:  12/31 ceftriaxone >>1/5  ceftaz 1/5>>>  12/31 azithro x1  12/31 oseltamivir >> 1/5 1/1 vanc >>  1/1 levaquin >>   SIGNIFICANT EVENTS:  1/2 intubated and oscillator.  1/3 changed to ARDS conventional ventilation / PC  1/4- remains on peep 16, 70%  1-5 remains on pressors.Placed on prone position , paralyzed . Transferred to 2100 1/5 Renal ultrasound 1/5:  No gross evidence for hydronephrosis or focal abnormality. 1/5-proned  DIET: TF  DVT Px: Sub q hep  GI Px: ppi  Overnight Events:  1. Continued to be acidotic . Changes in vent setting made:  1/5 at 2024: 7.1/281/ 273/25 1/5 at 2211: 7.21/53/91/21 1/6 at 0415: 7.06/86/89/24 > Vent setting FiO2 60 R 20 PEEP 16 Presure support 14 1/6 at 0530:7.22/52/154/21  2. Pressors: Dopamin > Tachycardia changed to Levo started on 15 mcg now down to 2 mcg. Not able to get off due BP down  3. Urine output: 850 cc/12 h  4. Temp of 101.1 at 12 am   Vital Signs: Temp:  [98.7 F (37.1 C)-101.1 F (38.4 C)] 98.7 F (37.1 C) (01/06 0409) Pulse Rate:  [70-120] 70  (01/06 0600) Resp:  [13-30] 22  (01/06 0600) BP: (91-146)/(36-73) 102/55 mmHg (01/06 0600) SpO2:  [90 %-100 %] 99 % (01/06 0600) Arterial Line BP: (82-161)/(36-76) 106/51 mmHg (01/06 0500) FiO2 (%):  [50 %-70 %] 60 % (01/06 0510) Weight:  [372 lb 5.7 oz (168.9 kg)] 372 lb 5.7 oz (168.9 kg) (01/06 0500) I/O last 3 completed shifts: In: 3023.4  [I.V.:2013.4; NG/GT:360; IV Piggyback:650] Out: 720 [Urine:720]  PHYSICAL EXAMINATION:  Gen: Sedated, proned HEENT: ETT in place PULM:decreased bs bases, coarse  CV: RRR, no mgr, no JVD  AB: BS+, soft,   Ext: warm, 2+  Edema, increased Derm: no rash or skin breakdown  Neuro: Sedated and paralyzed   Ventilator settings: Vent Mode:  [-] PCV FiO2 (%):  [50 %-70 %] 60 % Set Rate:  [20 bmp-30 bmp] 22 bmp PEEP:  [14 cmH20-16 cmH20] 16 cmH20 Plateau Pressure:  [22 cmH20-29 cmH20] 28 cmH20  Basename 08/11/12 0530 08/11/12 0415 08/10/12 2211  PHART 7.226* 7.067* 7.216*  PO2ART 154.0* 89.0 91.0  TCO2 22.6 27 23   HCO3 21.0 24.2* 21.4   Labs and Imaging:   Basic Metabolic Panel:  Lab 08/11/12 4098 08/10/12 1800 08/10/12 0455  NA 141 143 --  K 5.3* 5.1 --  CL 109 110 --  CO2 24 24 --  GLUCOSE 126* 112* --  BUN 80* 74* --  CREATININE 3.61* 3.20* --  CALCIUM 8.4 8.7 --  MG 2.5 2.4 --  PHOS 6.7* -- 3.0   Liver Function Tests:  Lab 08/09/12 0854 08/09/12 0400  AST 102* 119*  ALT 58* 62*  ALKPHOS 109 114  BILITOT 0.8 0.6  PROT 5.7* 5.8*  ALBUMIN 1.9* 2.0*    Lab 08/05/12 2223  LIPASE 90*  AMYLASE --    CBC:  Lab 08/11/12 0426 08/10/12 0455 08/05/12 2223 08/05/12 2013  WBC 8.6 6.6 -- --  NEUTROABS -- -- 2.8 2.8  HGB 9.8* 10.6* -- --  HCT 32.8* 33.7* -- --  MCV 90.4 86.6 -- --  PLT 291 245 -- --   Cardiac Enzymes:  Lab 08/06/12 1058 08/06/12 0445 08/05/12 2224  CKTOTAL -- -- --  CKMB -- -- --  CKMBINDEX -- -- --  TROPONINI <0.30 <0.30 <0.30   BNP:  Lab 08/05/12 2224  PROBNP 59.2    CBG:  Lab 08/11/12 0421 08/11/12 0026 08/10/12 2016 08/10/12 1840 08/10/12 1716  GLUCAP 122* 126* 120* 111* 106*    Coagulation:  Lab 08/05/12 2223  LABPROT 12.4  INR 0.93    Urinalysis:  Lab 08/09/12 2114 08/06/12 0500  COLORURINE ORANGE* YELLOW  LABSPEC 1.023 1.012  PHURINE 5.0 5.0  GLUCOSEU NEGATIVE NEGATIVE  HGBUR LARGE* MODERATE*  BILIRUBINUR MODERATE*  NEGATIVE  KETONESUR 15* NEGATIVE  PROTEINUR 30* NEGATIVE  UROBILINOGEN 1.0 0.2  NITRITE POSITIVE* NEGATIVE  LEUKOCYTESUR LARGE* LARGE*      Lab 08/05/12 2224  LATICACIDVEN 1.2  PROCALCITON 0.51    Assessment and Plan:  ASSESSMENT:  1) Acute respiratory failure presumably due to influenza pneumonia with bacterial superinfection; intubated on 1/2 on the oscillator with significant improvement in oxygenation. 1-3 changes to PCV  1/5 Transfer to 2100, placed in prone position and paralyzed   PLAN:  - pCxray am  - Lasix drip for neg balance, not met, see renal - prone position on PC 16/14, 50% (improved), rate 22, last abg reviewed, increase rate 30 -abg re evaluate current PAo2, goal reduction of fio2 to 40% then peep -need neg balance, see renal -no role steroids -ATX / collapse noted? RLL -maintain 3:1 ratio, will make a consideration for ECMO transfer when /if safe transfer available, will eval small ratio liberalization : would favor transfer if to 1:1 -Prone x 16 hrs then flip back , will cycle proning likley -with collapse? Concern mucous , would NOT use APRV -may require HFOC again -add chest PT, re eval PCXR 4 hours, too unstable for bronch -neb mucomysts x 48 hrs, dc if bronchospasm -avoid BDer's when able in ards  CARDIOVASCULAR  ASSESSMENT:  1) Shock, etiology sepsis, peep? sedation  PLAN:  -Continues to need pressors but now on Levo 2 mcg only  - Tele monitor  - kvo  - continue lasix drip to neg balance, see renal - d/c dopmaine due to  Tachycardia  -echo assessment, pending  RENAL  Lab Results   Component  Value  Date    CREATININE  2.43*  08/10/2012    CREATININE  1.43*  08/09/2012    CREATININE  1.29*  08/08/2012    ASSESSMENT:  1) ARF  Renal ultrasound 1/5:  No gross evidence for hydronephrosis or focal abnormality.  PLAN:   kvo  Lasix drip, increase 12 mg/hr Add metalazone Consider early initiation CVVHD Chem q12h Consult renal for possible  CVVHD   GASTROINTESTINAL  ASSESSMENT:  1) moderately elevated LFT's, uncertain etiology, Shock ?   PLAN:  - LFT's improving  1-4 hep c reactive, assess viral load, genotype Start  1/5 TF, oxepa, held for prone, consider post pyloric then restart Repeat dulx supp  Protonix Dose kayexalate Correct ph as able  HEMATOLOGIC  ASSESSMENT:  Sub q hep   PLAN:  -F/U CBC in am  -sub q hep  INFECTIOUS  ASSESSMENT:  1) Influenza  pneumonia  2) r/o super infection 3) UTI yeast PLAN:  -Vanc, levo  - ceftaz 1/5 due to fever  -repeat BC 1/5 -  sputum pending  -Oseltamivir high dose, reduce with renal failure, total 10 days from admission  -line appears clean, low threshold to change this Treat yeast  Change foley   ENDOCRINE  ASSESSMENT:  1) Hyperglycemia   PLAN:   ssi If remains pressors in 48 hrs, repeat cortisol tsh ensure  NEUROLOGIC  ASSESSMENT:  1) refractory hypoxia  PLAN:  -verse, fent  -unable to do WUA  continuous paralysis -rotation face ext with proning   CLINICAL SUMMARY: 57 y/o female with acute hypoxemic respiratory failure presumably from viral pneumonia vs severe CAP. Currently paralyzed and prone position. Acute renal failure, star cvvhd, prone cycle. ECMo if stable for transfer, unable now, increase MV. Will call [facility] for bed availability and on site cannulation?   Best practices / Disposition: -->ICU status under PCCM -->full code -->Heparin for DVT Px -->Protonix for GI Px -->ventilator bundle -->diet - TF on hold  Social: family  Updated in full   ILLATH,JASEELA 08/11/2012, 6:58 AM I have fully examined this patient and agree with above findings.    And edited in full  The patient is critically ill with multiple organ systems failure and requires high complexity decision making for assessment and support, frequent evaluation and titration of therapies, application of advanced monitoring technologies and extensive interpretation of  multiple databases. Critical Care Time devoted to patient care services described in this note is  50  minutes.  Mcarthur Rossetti. Tyson Alias, MD, FACP  Pgr: [phone]   Pulmonary & Critical Care

## 2012-08-12 LAB — CULTURE, BLOOD (ROUTINE X 2): Culture: NO GROWTH

## 2012-08-16 LAB — CULTURE, BLOOD (ROUTINE X 2): Culture: NO GROWTH

## 2012-08-18 LAB — CULTURE, BLOOD (ROUTINE X 2)

## 2012-08-22 ENCOUNTER — Other Ambulatory Visit (HOSPITAL_COMMUNITY): Payer: Self-pay

## 2012-08-22 ENCOUNTER — Inpatient Hospital Stay
Admission: AD | Admit: 2012-08-22 | Discharge: 2012-09-24 | Disposition: A | Discharge status: Rehabilitation Facility | Payer: Managed Care, Other (non HMO) | Source: Ambulatory Visit | Attending: Internal Medicine | Admitting: Internal Medicine

## 2012-08-22 DIAGNOSIS — Z930 Tracheostomy status: Secondary | ICD-10-CM

## 2012-08-22 DIAGNOSIS — J189 Pneumonia, unspecified organism: Secondary | ICD-10-CM

## 2012-08-22 DIAGNOSIS — Z9889 Other specified postprocedural states: Secondary | ICD-10-CM

## 2012-08-22 DIAGNOSIS — J9610 Chronic respiratory failure, unspecified whether with hypoxia or hypercapnia: Secondary | ICD-10-CM

## 2012-08-22 DIAGNOSIS — N179 Acute kidney failure, unspecified: Secondary | ICD-10-CM

## 2012-08-22 DIAGNOSIS — J9601 Acute respiratory failure with hypoxia: Secondary | ICD-10-CM

## 2012-08-22 LAB — BLOOD GAS, ARTERIAL
Acid-Base Excess: 1.3 mmol/L (ref 0.0–2.0)
Bicarbonate: 25.8 mEq/L — ABNORMAL HIGH (ref 20.0–24.0)
FIO2: 100 %
RATE: 20 resp/min
TCO2: 27.1 mmol/L (ref 0–100)
pCO2 arterial: 44.2 mmHg (ref 35.0–45.0)
pO2, Arterial: 69 mmHg — ABNORMAL LOW (ref 80.0–100.0)

## 2012-08-23 ENCOUNTER — Other Ambulatory Visit (HOSPITAL_COMMUNITY): Payer: Self-pay

## 2012-08-23 LAB — CBC
Hemoglobin: 11.2 g/dL — ABNORMAL LOW (ref 12.0–15.0)
MCH: 28.4 pg (ref 26.0–34.0)
MCV: 92.4 fL (ref 78.0–100.0)
RBC: 3.94 MIL/uL (ref 3.87–5.11)

## 2012-08-23 LAB — BLOOD GAS, ARTERIAL
Acid-Base Excess: 1.8 mmol/L (ref 0.0–2.0)
FIO2: 0.8 %
O2 Saturation: 99.4 %
PEEP: 10 cmH2O
Patient temperature: 98.6
pO2, Arterial: 155 mmHg — ABNORMAL HIGH (ref 80.0–100.0)

## 2012-08-23 LAB — COMPREHENSIVE METABOLIC PANEL
ALT: 29 U/L (ref 0–35)
Alkaline Phosphatase: 90 U/L (ref 39–117)
Chloride: 119 mEq/L — ABNORMAL HIGH (ref 96–112)
GFR calc Af Amer: 49 mL/min — ABNORMAL LOW (ref >90)
Glucose, Bld: 252 mg/dL — ABNORMAL HIGH (ref 70–99)
Potassium: 4.9 mEq/L (ref 3.5–5.1)
Sodium: 156 mEq/L — ABNORMAL HIGH (ref 135–145)
Total Bilirubin: 0.5 mg/dL (ref 0.3–1.2)
Total Protein: 5.4 g/dL — ABNORMAL LOW (ref 6.0–8.3)

## 2012-08-24 ENCOUNTER — Other Ambulatory Visit (HOSPITAL_COMMUNITY): Payer: Self-pay

## 2012-08-24 LAB — BASIC METABOLIC PANEL
BUN: 64 mg/dL — ABNORMAL HIGH (ref 6–23)
Creatinine, Ser: 1.32 mg/dL — ABNORMAL HIGH (ref 0.50–1.10)
GFR calc Af Amer: 52 mL/min — ABNORMAL LOW (ref >90)
GFR calc non Af Amer: 44 mL/min — ABNORMAL LOW (ref >90)
Potassium: 4.4 mEq/L (ref 3.5–5.1)

## 2012-08-24 LAB — CBC
HCT: 37.5 % (ref 36.0–46.0)
MCHC: 30.7 g/dL (ref 30.0–36.0)
MCV: 92.8 fL (ref 78.0–100.0)
RDW: 16.6 % — ABNORMAL HIGH (ref 11.5–15.5)

## 2012-08-25 LAB — BASIC METABOLIC PANEL
BUN: 66 mg/dL — ABNORMAL HIGH (ref 6–23)
Calcium: 8.7 mg/dL (ref 8.4–10.5)
Creatinine, Ser: 1.22 mg/dL — ABNORMAL HIGH (ref 0.50–1.10)
GFR calc Af Amer: 57 mL/min — ABNORMAL LOW (ref >90)
GFR calc non Af Amer: 49 mL/min — ABNORMAL LOW (ref >90)

## 2012-08-25 LAB — CBC
MCHC: 32.4 g/dL (ref 30.0–36.0)
RDW: 16.2 % — ABNORMAL HIGH (ref 11.5–15.5)

## 2012-08-26 LAB — BASIC METABOLIC PANEL
CO2: 26 mEq/L (ref 19–32)
Calcium: 8.7 mg/dL (ref 8.4–10.5)
Chloride: 104 mEq/L (ref 96–112)
Sodium: 142 mEq/L (ref 135–145)

## 2012-08-26 LAB — CBC
MCH: 28.5 pg (ref 26.0–34.0)
Platelets: 169 10*3/uL (ref 150–400)
RBC: 3.96 MIL/uL (ref 3.87–5.11)
WBC: 14.9 10*3/uL — ABNORMAL HIGH (ref 4.0–10.5)

## 2012-08-27 ENCOUNTER — Other Ambulatory Visit (HOSPITAL_COMMUNITY): Payer: Self-pay

## 2012-08-27 DIAGNOSIS — N179 Acute kidney failure, unspecified: Secondary | ICD-10-CM

## 2012-08-27 DIAGNOSIS — J189 Pneumonia, unspecified organism: Secondary | ICD-10-CM

## 2012-08-27 DIAGNOSIS — J9600 Acute respiratory failure, unspecified whether with hypoxia or hypercapnia: Secondary | ICD-10-CM

## 2012-08-27 LAB — BLOOD GAS, ARTERIAL
Acid-Base Excess: 1.9 mmol/L (ref 0.0–2.0)
Drawn by: 307971
FIO2: 0.6 %
O2 Saturation: 93.9 %
RATE: 26 resp/min
TCO2: 28.4 mmol/L (ref 0–100)
pO2, Arterial: 75.2 mmHg — ABNORMAL LOW (ref 80.0–100.0)

## 2012-08-27 NOTE — Consult Note (Signed)
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  LINES / TUBES: Trach (babptist)>>  CULTURES: Per ssh  ANTIBIOTICS: Per ssh Vanc po>> Flagyl>> SIGNIFICANT EVENTS:  1/22- ARDS net settings  HISTORY OF PRESENT ILLNESS:   57 yo wf who was transferred from [facility] to [facility] on 07-26-12 with VDRF secondary to H1N1. She did not required ECMO at [facility] as expected and reached [facility] and was transferred to [facility] 08-23-11. PCCM asked to consult. She was also treated for cdiff. Note she had tracheostomy placed and remained on PCV. Placed on ARDS protocol.  Now off HD, on lasix. Does not have peg.  PAST MEDICAL HISTORY :  Past Medical History  Diagnosis Date  . Hypertension   VDRF with h1N1 Renal failure  Steroid induced hyperglycemia CAd Asthma Anemia  c dif colitis MO  Past Surgical History  Procedure Date  . No past surgeries    Medications reviewed   Allergies  Allergen Reactions  . Aspirin Nausea And Vomiting  . Sulfa Antibiotics     nausea    FAMILY HISTORY:  Not available on vent but reviewed with daughter. SOCIAL HISTORY:  reports that she has never smoked. She does not have any smokeless tobacco history on file. She reports that she does not drink alcohol or use illicit drugs.  REVIEW OF SYSTEMS:  NA secondary on vent  VITAL SIGNS: Vital signs reviewed. Abnormal values will appear under impression plan section.     VENTILATOR SETTINGS:   INTAKE / OUTPUT: Intake/Output    None     PHYSICAL EXAMINATION: General:  Obese wf on vent and uncomfortable Neuro:  Intact HEENT:  No LAN Cardiovascular:  HSR RRR Lungs:  Coarse rhonchi , trach-> vent, asynchronous, air hungry  Abdomen:  + bs on tf Musculoskeletal:  intact Skin:  Warm and flushed   LABS: Cbc  Lab 08/26/12 0735 08/25/12 0903 08/24/12 0708  WBC 14.9* -- --  HGB 11.3* 11.0* 11.5*    HCT 35.4* 34.0* 37.5  PLT 169 156 281    Chemistry   Lab 08/26/12 0735 08/25/12 0903 08/24/12 0708  NA 142 149* 153*  K 4.3 4.8 4.4  CL 104 110 118*  CO2 26 26 24   BUN 71* 66* 64*  CREATININE 1.06 1.22* 1.32*  CALCIUM 8.7 8.7 8.9  MG -- -- --  PHOS -- -- --  GLUCOSE 370* 314* 276*    Liver fxn  Lab 08/23/12 0734  AST 15  ALT 29  ALKPHOS 90  BILITOT 0.5  PROT 5.4*  ALBUMIN 2.9*   coags No results found for this basename: APTT:3,INR:3 in the last 168 hours Sepsis markers No results found for this basename: LATICACIDVEN:3,PROCALCITON:3 in the last 168 hours Cardiac markers No results found for this basename: CKTOTAL:3,CKMB:3,TROPONINI:3 in the last 168 hours BNP  Lab 08/24/12 0708  PROBNP 184.3*   ABG  Lab 08/27/12 1300 08/23/12 0125 08/22/12 1900  PHART 7.352 7.379 7.384  PCO2ART 49.8* 45.9* 44.2  PO2ART 75.2* 155.0* 69.0*  HCO3 26.9* 26.4* 25.8*  TCO2 28.4 27.8 27.1    CBG trend No results found for this basename: GLUCAP:5 in the last 168 hours  IMAGING: Dg Chest Port 1 View  08/27/2012  *RADIOLOGY REPORT*  Clinical Data: ARDS.  PORTABLE CHEST - 1 VIEW  Comparison: 01/19 and 08/11/2012  Findings: Tracheostomy tube and feeding tube  are in place.  Heart size and pulmonary vascularity are normal.  There is persistent coarse accentuation of the interstitial markings bilaterally without significant change.  No effusions.  No acute osseous abnormality.  IMPRESSION: No change in the coarse interstitial infiltrates.   Original Report Authenticated By: Francene Boyers, M.D.     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 PCCM consulted 1-22 for vent management. ARDS remains PLAN:   -Placed on ARDS protocol 1-22 (very uncomfortable on this mode), current 400 rate 26, Ph noted wnl, maintain current MV -PaO2 noted, to 50%, then goal 40% then peep reduction. -last plat is 22, if agitation a  major issue, may liberalize TV by 1 cc/kg, however, she is still early in ARDS injury  And would favor same TV currently we are on now -No role bicarb -Remain and ensure on 6 cc/kg -sedation as needed -follow chest x rays in am  -taper solumedrol, limited role -Would also prefer to avoid PCV ventilation for now -No line noted, no cvp, FACCT trial, goal neg balance 1 liter next 24 hrs, no cvp to follow, would use clinical assessment -ABg in am   C diff -per ssh  SIDM -per ssh  Renal failure -resolved -lasix to neg balance -chem needed in am   [facility] Minor ACNP Adolph Pollack PCCM Pager [phone] till 3 pm If no answer page [phone] 08/27/2012, 2:29 PM   I have fully examined this patient and agree with above findings.    And edited in full  Mcarthur Rossetti. Tyson Alias, MD, FACP Pgr: [phone]  Pulmonary & Critical Care

## 2012-08-28 ENCOUNTER — Other Ambulatory Visit (HOSPITAL_COMMUNITY): Payer: Self-pay

## 2012-08-28 LAB — BASIC METABOLIC PANEL
BUN: 92 mg/dL — ABNORMAL HIGH (ref 6–23)
Calcium: 9.5 mg/dL (ref 8.4–10.5)
Creatinine, Ser: 1.03 mg/dL (ref 0.50–1.10)
GFR calc Af Amer: 70 mL/min — ABNORMAL LOW (ref >90)
GFR calc non Af Amer: 60 mL/min — ABNORMAL LOW (ref >90)

## 2012-08-28 LAB — BLOOD GAS, ARTERIAL
Bicarbonate: 28.7 mEq/L — ABNORMAL HIGH (ref 20.0–24.0)
Drawn by: 25700
MECHVT: 400 mL
PEEP: 10 cmH2O
Patient temperature: 98.7
RATE: 26 resp/min
pH, Arterial: 7.365 (ref 7.350–7.450)

## 2012-08-28 LAB — MAGNESIUM: Magnesium: 2.6 mg/dL — ABNORMAL HIGH (ref 1.5–2.5)

## 2012-08-29 ENCOUNTER — Other Ambulatory Visit (HOSPITAL_COMMUNITY): Payer: Self-pay

## 2012-08-29 DIAGNOSIS — J111 Influenza due to unidentified influenza virus with other respiratory manifestations: Secondary | ICD-10-CM

## 2012-08-29 LAB — CBC
HCT: 36.4 % (ref 36.0–46.0)
Platelets: 120 10*3/uL — ABNORMAL LOW (ref 150–400)
RDW: 16.6 % — ABNORMAL HIGH (ref 11.5–15.5)
WBC: 21.8 10*3/uL — ABNORMAL HIGH (ref 4.0–10.5)

## 2012-08-29 LAB — COMPREHENSIVE METABOLIC PANEL
ALT: 40 U/L — ABNORMAL HIGH (ref 0–35)
AST: 20 U/L (ref 0–37)
Albumin: 2.8 g/dL — ABNORMAL LOW (ref 3.5–5.2)
Alkaline Phosphatase: 66 U/L (ref 39–117)
Chloride: 111 mEq/L (ref 96–112)
Potassium: 4.2 mEq/L (ref 3.5–5.1)
Total Bilirubin: 0.5 mg/dL (ref 0.3–1.2)

## 2012-08-29 NOTE — Progress Notes (Signed)
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  INTERVAL HX Tolerating reduction of peep/fio2   Now down to  fio2 .5/10 peep  LINES / TUBES: Trach ([facility]ist)>>  CULTURES: Per ssh  ANTIBIOTICS: Per ssh Vanc po>> Flagyl>>  SIGNIFICANT EVENTS:  1/22- ARDS net settings  VITAL SIGNS: Vital signs reviewed. Abnormal values will appear under impression plan section.     VENTILATOR SETTINGS: 50% Vt 400 peep 10 Rate 26  pplat 25     PHYSICAL EXAMINATION: General:  Obese wf on vent and more synchronous. Neuro:  Intact HEENT:  No LAN Cardiovascular:  HSR RRR Lungs:  Coarse rhonchi , trach Abdomen:  + bs on tf Musculoskeletal:  intact Skin:  Warm and flushed   LABS: Cbc  Lab 08/29/12 0655 08/26/12 0735 08/25/12 0903  WBC 21.8* -- --  HGB 11.4* 11.3* 11.0*  HCT 36.4 35.4* 34.0*  PLT 120* 169 156    Chemistry   Lab 08/29/12 0655 08/28/12 0628 08/26/12 0735  NA 149* 152* 142  K 4.2 4.0 4.3  CL 111 110 104  CO2 26 27 26   BUN 90* 92* 71*  CREATININE 0.93 1.03 1.06  CALCIUM 8.8 9.5 8.7  MG -- 2.6* --  PHOS -- -- --  GLUCOSE 315* 284* 370*    Liver fxn  Lab 08/29/12 0655 08/23/12 0734  AST 20 15  ALT 40* 29  ALKPHOS 66 90  BILITOT 0.5 0.5  PROT 5.2* 5.4*  ALBUMIN 2.8* 2.9*   coags No results found for this basename: APTT:3,INR:3 in the last 168 hours Sepsis markers No results found for this basename: LATICACIDVEN:3,PROCALCITON:3 in the last 168 hours Cardiac markers No results found for this basename: CKTOTAL:3,CKMB:3,TROPONINI:3 in the last 168 hours BNP  Lab 08/28/12 0628 08/24/12 0708  PROBNP 460.2* 184.3*   ABG  Lab 08/28/12 0445 08/27/12 1300 08/23/12 0125  PHART 7.365 7.352 7.379  PCO2ART 51.6* 49.8* 45.9*  PO2ART 54.2* 75.2* 155.0*  HCO3 28.7* 26.9* 26.4*  TCO2 30.3 28.4 27.8    CBG trend No results found for  this basename: GLUCAP:5 in the last 168 hours  IMAGING: CXR 1/24: ARDS pattern   ASSESSMENT / PLAN:  PULMONARY  ASSESSMENT: 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 PCCM consulted 1-22 for vent management. ARDS remains PLAN:   -Cont  on ARDS protocol  current 400 rate 26, maintain current MV, try to reduce to peep 8 fio2 to .4 1/24  -Remain and ensure on 6 cc/kg -sedation as needed -follow chest x rays in am 1/25 -taper solumedrol, limited role -Would also prefer to avoid PCV ventilation for now -diurese as able  C diff -per ssh  SIDM -per ssh  Renal failure -resolved -lasix to neg balance -chem needed in am   CC Dorcas Carrow Beeper  [phone]  Cell  [phone]  If no response or cell goes to voicemail, call beeper [phone]  08/29/2012, 10:39 AM

## 2012-08-30 ENCOUNTER — Other Ambulatory Visit (HOSPITAL_COMMUNITY): Payer: Self-pay

## 2012-08-31 LAB — BASIC METABOLIC PANEL
CO2: 30 mEq/L (ref 19–32)
Calcium: 9 mg/dL (ref 8.4–10.5)
Creatinine, Ser: 0.74 mg/dL (ref 0.50–1.10)
GFR calc Af Amer: >90 mL/min (ref >90)

## 2012-08-31 LAB — CBC
MCH: 28.7 pg (ref 26.0–34.0)
MCV: 89.8 fL (ref 78.0–100.0)
Platelets: 120 10*3/uL — ABNORMAL LOW (ref 150–400)
RDW: 17.1 % — ABNORMAL HIGH (ref 11.5–15.5)

## 2012-08-31 LAB — MAGNESIUM: Magnesium: 1.9 mg/dL (ref 1.5–2.5)

## 2012-09-01 ENCOUNTER — Other Ambulatory Visit (HOSPITAL_COMMUNITY): Payer: Self-pay

## 2012-09-01 DIAGNOSIS — Z930 Tracheostomy status: Secondary | ICD-10-CM

## 2012-09-01 DIAGNOSIS — J9610 Chronic respiratory failure, unspecified whether with hypoxia or hypercapnia: Secondary | ICD-10-CM

## 2012-09-01 DIAGNOSIS — Z9889 Other specified postprocedural states: Secondary | ICD-10-CM

## 2012-09-01 LAB — BLOOD GAS, ARTERIAL
Bicarbonate: 31.1 mEq/L — ABNORMAL HIGH (ref 20.0–24.0)
O2 Saturation: 96.3 %
PEEP: 5 cmH2O
pCO2 arterial: 44.5 mmHg (ref 35.0–45.0)
pO2, Arterial: 70.4 mmHg — ABNORMAL LOW (ref 80.0–100.0)

## 2012-09-01 LAB — CBC
HCT: 29 % — ABNORMAL LOW (ref 36.0–46.0)
MCHC: 33.1 g/dL (ref 30.0–36.0)
RDW: 17.5 % — ABNORMAL HIGH (ref 11.5–15.5)

## 2012-09-01 LAB — BASIC METABOLIC PANEL
BUN: 48 mg/dL — ABNORMAL HIGH (ref 6–23)
Chloride: 99 mEq/L (ref 96–112)
GFR calc Af Amer: >90 mL/min (ref >90)
Potassium: 4.2 mEq/L (ref 3.5–5.1)
Sodium: 136 mEq/L (ref 135–145)

## 2012-09-01 NOTE — Progress Notes (Signed)
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  INTERVAL HX weaning  LINES / TUBES: Trach (baptist)>>  CULTURES: Per ssh  ANTIBIOTICS: Per ssh   SIGNIFICANT EVENTS:  1/22- ARDS net settings  VITAL SIGNS: Vital signs reviewed. Abnormal values will appear under impression plan section.     VENTILATOR SETTINGS: 50% Vt 400 peep 10 Rate 26  pplat 25     PHYSICAL EXAMINATION: General:  Obese wf on vent , weaning Neuro:  Intact HEENT:  No LAN Cardiovascular:  HSR RRR Lungs:  Coarse rhonchi , trach Abdomen:  + bs on tf Musculoskeletal:  intact Skin:  Warm and flushed   LABS: Cbc  Lab 09/01/12 0555 08/31/12 0510 08/29/12 0655  WBC 9.2 -- --  HGB 9.6* 9.6* 11.4*  HCT 29.0* 30.0* 36.4  PLT PLATELET CLUMPS NOTED ON SMEAR, UNABLE TO ESTIMATE 120* 120*    Chemistry   Lab 09/01/12 0555 08/31/12 0510 08/29/12 0655 08/28/12 0628  NA 136 140 149* --  K 4.2 4.7 4.2 --  CL 99 104 111 --  CO2 30 30 26  --  BUN 48* 60* 90* --  CREATININE 0.66 0.74 0.93 --  CALCIUM 8.9 9.0 8.8 --  MG -- 1.9 -- 2.6*  PHOS -- -- -- --  GLUCOSE 127* 152* 315* --    Liver fxn  Lab 08/29/12 0655  AST 20  ALT 40*  ALKPHOS 66  BILITOT 0.5  PROT 5.2*  ALBUMIN 2.8*   coags No results found for this basename: APTT:3,INR:3 in the last 168 hours Sepsis markers No results found for this basename: LATICACIDVEN:3,PROCALCITON:3 in the last 168 hours Cardiac markers No results found for this basename: CKTOTAL:3,CKMB:3,TROPONINI:3 in the last 168 hours BNP  Lab 08/28/12 0628  PROBNP 460.2*   ABG  Lab 09/01/12 0600 08/28/12 0445 08/27/12 1300  PHART 7.459* 7.365 7.352  PCO2ART 44.5 51.6* 49.8*  PO2ART 70.4* 54.2* 75.2*  HCO3 31.1* 28.7* 26.9*  TCO2 32.5 30.3 28.4    CBG trend No results found for this basename: GLUCAP:5 in the last 168  hours  IMAGING: Dg Chest Port 1 View  09/01/2012  *RADIOLOGY REPORT*  Clinical Data: ARDS  PORTABLE CHEST - 1 VIEW  Comparison: 08/30/2012  Findings: Tracheostomy tube and feeding tube are stable.  Feeding tube tip is in the fundus of the stomach.  Diffuse bilateral airspace disease is slightly improved.  Upper normal heart size. No pneumothorax.  IMPRESSION: Slightly improved airspace disease.   Original Report Authenticated By: Jolaine Click, M.D.      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 PCCM consulted 1-22 for vent management. ARDS remains PLAN:   -weaning per protocol 1/27 -follow chest x rays -taper solumedrol, limited role -diurese as able  C diff -per ssh  SIDM -per ssh  Renal failure Lab Results  Component Value Date   CREATININE 0.66 09/01/2012   CREATININE 0.74 08/31/2012   CREATININE 0.93 08/29/2012    -resolved -lasix to neg balance -chem needed in am   [facility] Minor ACNP Adolph Pollack PCCM Pager [phone] till 3 pm If no answer page [phone] 09/01/2012, 12:57 PM   Billy Fischer, MD ; [facility] service Mobile [phone].  After 5:30 PM or weekends, call [phone]

## 2012-09-02 LAB — CBC
HCT: 32.1 % — ABNORMAL LOW (ref 36.0–46.0)
Hemoglobin: 10.5 g/dL — ABNORMAL LOW (ref 12.0–15.0)
RBC: 3.59 MIL/uL — ABNORMAL LOW (ref 3.87–5.11)
WBC: 10.2 10*3/uL (ref 4.0–10.5)

## 2012-09-03 ENCOUNTER — Other Ambulatory Visit (HOSPITAL_COMMUNITY): Payer: Self-pay

## 2012-09-03 LAB — CBC
Hemoglobin: 10 g/dL — ABNORMAL LOW (ref 12.0–15.0)
MCH: 29 pg (ref 26.0–34.0)
MCV: 89.6 fL (ref 78.0–100.0)
RBC: 3.45 MIL/uL — ABNORMAL LOW (ref 3.87–5.11)
WBC: 8.7 10*3/uL (ref 4.0–10.5)

## 2012-09-04 NOTE — Progress Notes (Signed)
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  INTERVAL HX weaning  LINES / TUBES: Trach ([facility])>>  CULTURES: Per ssh  ANTIBIOTICS: Per ssh   SIGNIFICANT EVENTS:  1/22- ARDS net settings  VITAL SIGNS: Vital signs reviewed. Abnormal values will appear under impression plan section.      PHYSICAL EXAMINATION: General:  Obese wf on vent , weaning peep to 8 Neuro:  Intact HEENT:  No LAN Cardiovascular:  HSR RRR Lungs:  Coarse rhonchi , trach Abdomen:  + bs on tf Musculoskeletal:  intact Skin:  Warm and flushed   LABS: Cbc  Lab 09/03/12 0621 09/02/12 0648 09/01/12 0555  WBC 8.7 -- --  HGB 10.0* 10.5* 9.6*  HCT 30.9* 32.1* 29.0*  PLT 78* 75* PLATELET CLUMPS NOTED ON SMEAR, UNABLE TO ESTIMATE    Chemistry   Lab 09/01/12 0555 08/31/12 0510 08/29/12 0655  NA 136 140 149*  K 4.2 4.7 4.2  CL 99 104 111  CO2 30 30 26   BUN 48* 60* 90*  CREATININE 0.66 0.74 0.93  CALCIUM 8.9 9.0 8.8  MG -- 1.9 --  PHOS -- -- --  GLUCOSE 127* 152* 315*    Liver fxn  Lab 08/29/12 0655  AST 20  ALT 40*  ALKPHOS 66  BILITOT 0.5  PROT 5.2*  ALBUMIN 2.8*   coags No results found for this basename: APTT:3,INR:3 in the last 168 hours Sepsis markers No results found for this basename: LATICACIDVEN:3,PROCALCITON:3 in the last 168 hours Cardiac markers No results found for this basename: CKTOTAL:3,CKMB:3,TROPONINI:3 in the last 168 hours BNP No results found for this basename: PROBNP:3 in the last 168 hours ABG  Lab 09/01/12 0600  PHART 7.459*  PCO2ART 44.5  PO2ART 70.4*  HCO3 31.1*  TCO2 32.5    CBG trend No results found for this basename: GLUCAP:5 in the last 168 hours  IMAGING: Dg Chest Port 1 View  09/03/2012  *RADIOLOGY REPORT*  Clinical Data: Acute respiratory failure on ventilator.  PORTABLE CHEST - 1 VIEW  Comparison:  09/01/2012  Findings:  Tracheostomy tube and feeding tube remain in place. Mildly improved aeration is seen in the left lung base.  Diffuse interstitial infiltrates are otherwise stable.  The heart size is within normal limits in stable.  No pneumothorax identified.  IMPRESSION: Mildly improved aeration of left lung base.  Otherwise stable diffuse interstitial infiltrates.   Original Report Authenticated By: Myles Rosenthal, M.D.      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 PCCM consulted 1-22 for vent management. ARDS remains PLAN:   -weaning per protocol 1/27, 1-30 peep tp 8 -follow chest x rays -taper solumedrol, limited role -diurese as able -PCCM see on 2-3  C diff -per ssh  SIDM -per ssh  Renal failure(per [facility]) Lab Results  Component Value Date   CREATININE 0.66 09/01/2012   CREATININE 0.74 08/31/2012   CREATININE 0.93 08/29/2012    -resolved -chem as needed  [name] ACNP [name] PCCM Pager [phone] till 3 pm If no answer page [phone] 09/04/2012, 9:41 AM    Profoundly depressed but making progress. Discussed with Dr Albertina Parr, MD ; [facility] [phone].  After 5:30 PM or weekends, call [phone]

## 2012-09-06 LAB — BASIC METABOLIC PANEL
BUN: 45 mg/dL — ABNORMAL HIGH (ref 6–23)
Chloride: 100 mEq/L (ref 96–112)
Creatinine, Ser: 0.47 mg/dL — ABNORMAL LOW (ref 0.50–1.10)
GFR calc Af Amer: >90 mL/min (ref >90)
GFR calc non Af Amer: >90 mL/min (ref >90)
Glucose, Bld: 129 mg/dL — ABNORMAL HIGH (ref 70–99)

## 2012-09-06 LAB — CBC
HCT: 31.3 % — ABNORMAL LOW (ref 36.0–46.0)
Hemoglobin: 10.1 g/dL — ABNORMAL LOW (ref 12.0–15.0)
MCHC: 32.3 g/dL (ref 30.0–36.0)
MCV: 90.5 fL (ref 78.0–100.0)
RDW: 19.2 % — ABNORMAL HIGH (ref 11.5–15.5)

## 2012-09-08 ENCOUNTER — Other Ambulatory Visit (HOSPITAL_COMMUNITY): Payer: Self-pay

## 2012-09-08 DIAGNOSIS — J9620 Acute and chronic respiratory failure, unspecified whether with hypoxia or hypercapnia: Secondary | ICD-10-CM

## 2012-09-08 NOTE — Progress Notes (Addendum)
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  INTERVAL HX weaning  LINES / TUBES: Trach ([facility])>>  CULTURES: Per ssh  ANTIBIOTICS: Per ssh   SIGNIFICANT EVENTS:  1/22- ARDS net settings   SUBJECTIVE/OVERNIGHT/INTERVAL HX 09/08/12: She is doing pressure support trial stage IV which means she is on it for 12 hours. This is a modified protocol because she failed full ventilator support due to dyssynchrony. At night she rests on 15/10 pressure support she is mentally intact. She suffers from C. difficile  VITAL SIGNS: Temperature 98.3 pulse of 62. Respirator 18. Blood pressure 110/70. Pulse Ox 96%. Sugar 139 mg percent.   PHYSICAL EXAMINATION: General:  Obese wf on vent ,  Neuro:  Intact HEENT:  No LAN Cardiovascular:  HSR RRR Lungs:  Coarse rhonchi , trach Abdomen:  + bs on tf Musculoskeletal:  intact Skin:  Warm and flushed   LABS: Cbc  Lab 09/06/12 0727 09/03/12 0621 09/02/12 0648  WBC 8.6 -- --  HGB 10.1* 10.0* 10.5*  HCT 31.3* 30.9* 32.1*  PLT 110* 78* 75*    Chemistry   Lab 09/06/12 0727  NA 140  K 3.7  CL 100  CO2 31  BUN 45*  CREATININE 0.47*  CALCIUM 9.2  MG --  PHOS --  GLUCOSE 129*    Liver fxn No results found for this basename: AST:3,ALT:3,ALKPHOS:3,BILITOT:3,PROT:3,ALBUMIN:3 in the last 168 hours coags No results found for this basename: APTT:3,INR:3 in the last 168 hours Sepsis markers No results found for this basename: LATICACIDVEN:3,PROCALCITON:3 in the last 168 hours Cardiac markers No results found for this basename: CKTOTAL:3,CKMB:3,TROPONINI:3 in the last 168 hours BNP No results found for this basename: PROBNP:3 in the last 168 hours ABG No results found for this basename: PHART:3,PCO2ART:3,PO2ART:3,HCO3:3,TCO2:3 in the last 168 hours  CBG trend No results found for this basename: GLUCAP:5 in  the last 168 hours  IMAGING: Dg Chest Port 1 View  09/08/2012  *RADIOLOGY REPORT*  Clinical Data: Tracheostomy support.  Follow-up.  PORTABLE CHEST - 1 VIEW  Comparison: 09/03/2012  Findings: Tracheostomy and soft feeding tube appear unchanged. Feeding tube tip is in the gastric fundus.  Patchy diffuse pulmonary airspace density persists.  This is similar to the previous studies allowing for technical differences.  No qualitatively new finding.  IMPRESSION: Tracheostomy and soft feeding tube grossly unchanged.  Bilateral diffuse lung density persists, not significantly changed allowing for technical factors.   Original Report Authenticated By: Paulina Fusi, M.D.      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 PCCM consulted 1-22 for vent management. ARDS remains  As a 09/08/2012. She rests on pressure support trial at 15 over 10 at night and does 12/5 and the daytime. sHe is slowly improving  PLAN:   -weaning per protocol  -follow chest x rays -taper solumedrol, limited role -diurese as able -PCCM see on 2-3 - Will need aggressive mobilization to help further improvement  C diff -per ssh  SIDM -per ssh  Renal failure(per [facility]) Lab Results  Component Value Date   CREATININE 0.47* 09/06/2012   CREATININE 0.66 09/01/2012   CREATININE 0.74 08/31/2012    -resolved -chem as needed    Dr. Kalman Shan, M.D., [facility].C.P Pulmonary and Critical Care Medicine Staff Physician [place] [facility] [place] Pulmonary and Critical Care Pager:  [phone], If no answer or between  15:00h - 7:00h: call [phone]  09/08/2012 11:19 AM

## 2012-09-09 LAB — BASIC METABOLIC PANEL
BUN: 47 mg/dL — ABNORMAL HIGH (ref 6–23)
CO2: 36 mEq/L — ABNORMAL HIGH (ref 19–32)
GFR calc non Af Amer: >90 mL/min (ref >90)
Glucose, Bld: 91 mg/dL (ref 70–99)
Potassium: 4.1 mEq/L (ref 3.5–5.1)
Sodium: 136 mEq/L (ref 135–145)

## 2012-09-10 LAB — CBC
Hemoglobin: 10.6 g/dL — ABNORMAL LOW (ref 12.0–15.0)
MCH: 29.9 pg (ref 26.0–34.0)
MCHC: 33.5 g/dL (ref 30.0–36.0)
MCV: 89 fL (ref 78.0–100.0)
RBC: 3.55 MIL/uL — ABNORMAL LOW (ref 3.87–5.11)

## 2012-09-10 NOTE — Progress Notes (Addendum)
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  INTERVAL HX weaning  LINES / TUBES: Trach ([facility])>>  CULTURES: Per ssh  ANTIBIOTICS: Per ssh   SIGNIFICANT EVENTS:  1/22- ARDS net settings   SUBJECTIVE/OVERNIGHT/INTERVAL HX 09/12/2012: She is doing tracheostomy collar trials and the daytime 8-12 hours for the last few days. At night she is being rested on pressure support ventilation. Plan is to downsize her tracheostomy from #8 to #6  VITAL SIGNS:  09/12/2012: Temperature 90.0. Pulse of 69. Respirator 20. Blood pressure 115/70. Pulse ox of 90%  PHYSICAL EXAMINATION: General:  Obese wf on vent ,  Neuro:  Intact HEENT:  No LAN Cardiovascular:  HSR RRR Lungs:  Coarse rhonchi , trach Abdomen:  + bs on tf Musculoskeletal:  intact Skin:  Warm and flushed   LABS: Cbc  Lab 09/10/12 0632 09/06/12 0727  WBC 6.2 --  HGB 10.6* 10.1*  HCT 31.6* 31.3*  PLT 143* 110*    Chemistry   Lab 09/09/12 1654 09/06/12 0727  NA 136 140  K 4.1 3.7  CL 96 100  CO2 36* 31  BUN 47* 45*  CREATININE 0.49* 0.47*  CALCIUM 9.3 9.2  MG -- --  PHOS -- --  GLUCOSE 91 129*    Liver fxn No results found for this basename: AST:3,ALT:3,ALKPHOS:3,BILITOT:3,PROT:3,ALBUMIN:3 in the last 168 hours coags  Lab 09/09/12 0712  APTT --  INR 0.93   Sepsis markers No results found for this basename: LATICACIDVEN:3,PROCALCITON:3 in the last 168 hours Cardiac markers No results found for this basename: CKTOTAL:3,CKMB:3,TROPONINI:3 in the last 168 hours BNP No results found for this basename: PROBNP:3 in the last 168 hours ABG No results found for this basename: PHART:3,PCO2ART:3,PO2ART:3,HCO3:3,TCO2:3 in the last 168 hours  CBG trend No results found for this basename: GLUCAP:5 in the last 168 hours  IMAGING: No results found.   ASSESSMENT / PLAN: : VDRF  secondary to H1N1 w/ ARDS, requiring tracheostomy and prolonged vent support. PCCM consulted 1-22 for vent management.   PLAN:   -weaning per protocol on 09/12/2012 Will downsize tracheostomy from #8 to #6 -follow chest x rays -taper solumedrol, limited role -diurese as able - Will need aggressive mobilization to help further improvement  C diff -per ssh     Dr. Kalman Shan, M.D., [facility].C.P Pulmonary and Critical Care Medicine Staff Physician [place] [facility] [place] Pulmonary and Critical Care Pager: [phone], If no answer or between  15:00h - 7:00h: call [phone]  09/12/2012 11:44 AM

## 2012-09-10 NOTE — Progress Notes (Signed)
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  INTERVAL HX weaning  LINES / TUBES: Trach ([facility])>>  CULTURES: Per ssh  ANTIBIOTICS: Per ssh   SIGNIFICANT EVENTS:  1/22- ARDS net settings 09/08/12: She is doing pressure support trial stage IV which means she is on it for 12 hours. This is a modified protocol because she failed full ventilator support due to dyssynchrony. At night she rests on 15/10 pressure support she is mentally intact. She suffers from C. difficile   SUBJECTIVE/OVERNIGHT/INTERVAL HX 09/10/2012: She is to start her on tracheostomy collar for the daytime today. Overnight she is being rested on pressure support ventilation 15/10. Husband at the bedside. she sat in the side of the bed yesterday  VITAL SIGNS: Temperature 97.8. Pulse of 68 per minute. Respirator 20. Blood pressure 130/66. Pulse ox of 100%.   PHYSICAL EXAMINATION: General:  Obese wf on vent , very deconditioned Neuro:  Intact HEENT:  No LAN Cardiovascular:  HSR RRR Lungs:  Coarse rhonchi , trach Abdomen:  + bs on tf Musculoskeletal: Lower extremity in boots Skin:  Warm and flushed   LABS: Cbc  Lab 09/10/12 0632 09/06/12 0727  WBC 6.2 --  HGB 10.6* 10.1*  HCT 31.6* 31.3*  PLT 143* 110*    Chemistry   Lab 09/09/12 1654 09/06/12 0727  NA 136 140  K 4.1 3.7  CL 96 100  CO2 36* 31  BUN 47* 45*  CREATININE 0.49* 0.47*  CALCIUM 9.3 9.2  MG -- --  PHOS -- --  GLUCOSE 91 129*    Liver fxn No results found for this basename: AST:3,ALT:3,ALKPHOS:3,BILITOT:3,PROT:3,ALBUMIN:3 in the last 168 hours coags  Lab 09/09/12 0712  APTT --  INR 0.93   Sepsis markers No results found for this basename: LATICACIDVEN:3,PROCALCITON:3 in the last 168 hours Cardiac markers No results found for this basename: CKTOTAL:3,CKMB:3,TROPONINI:3 in the last 168  hours BNP No results found for this basename: PROBNP:3 in the last 168 hours ABG No results found for this basename: PHART:3,PCO2ART:3,PO2ART:3,HCO3:3,TCO2:3 in the last 168 hours  CBG trend No results found for this basename: GLUCAP:5 in the last 168 hours  IMAGING: No results found.   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 PCCM consulted 1-22 for vent management. ARDS remains  As a 09/10/2012. She is improved starting tracheostomy collar today but still being rested on pressure support overnight. She still very deconditioned and she needs aggressive mobilization. I've told her husband to get some tennis balls in subjected to some wrist exercises     PLAN:   -weaning per protocol  -follow chest x rays -taper solumedrol, limited role -diurese as able -PCCM see on 2-3 - Will need aggressive mobilization to help further improvement  C diff -per ssh  SIDM -per ssh  Renal failure(per [facility]) Lab Results  Component Value Date   CREATININE 0.49* 09/09/2012   CREATININE 0.47* 09/06/2012   CREATININE 0.66 09/01/2012    -resolved -chem as needed    Dr. Kalman Shan, M.D., [facility].C.P Pulmonary and Critical Care Medicine Staff Physician [place] [facility] [place] Pulmonary and Critical Care Pager: [phone], If no answer or between  15:00h - 7:00h: call [phone]  09/10/2012 11:02 AM

## 2012-09-15 ENCOUNTER — Other Ambulatory Visit (HOSPITAL_COMMUNITY): Payer: Self-pay

## 2012-09-15 LAB — BLOOD GAS, ARTERIAL
FIO2: 0.28 %
Patient temperature: 98.6
pCO2 arterial: 46.6 mmHg — ABNORMAL HIGH (ref 35.0–45.0)
pH, Arterial: 7.459 — ABNORMAL HIGH (ref 7.350–7.450)

## 2012-09-15 LAB — BASIC METABOLIC PANEL
Chloride: 94 mEq/L — ABNORMAL LOW (ref 96–112)
GFR calc Af Amer: >90 mL/min (ref >90)
GFR calc non Af Amer: >90 mL/min (ref >90)
Potassium: 3.5 mEq/L (ref 3.5–5.1)
Sodium: 134 mEq/L — ABNORMAL LOW (ref 135–145)

## 2012-09-15 LAB — CBC
HCT: 33.3 % — ABNORMAL LOW (ref 36.0–46.0)
MCHC: 33.3 g/dL (ref 30.0–36.0)
RDW: 18.7 % — ABNORMAL HIGH (ref 11.5–15.5)
WBC: 5.6 10*3/uL (ref 4.0–10.5)

## 2012-09-15 NOTE — Progress Notes (Signed)
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   LINES / TUBES: Trach ([facility])>>  CULTURES: Per ssh  ANTIBIOTICS: Per ssh   SIGNIFICANT EVENTS:  1/22 - ARDS net settings 2/07 - She is doing tracheostomy collar trials and the daytime 8-12 hours for the last few days. At night she is being rested on pressure support ventilation. Plan is to downsize her tracheostomy from #8 to #6  SUBJECTIVE/OVERNIGHT/INTERVAL HX 2/10 - weaning on 28% ATC, no distress, tolerating PMV  VITAL SIGNS: Reviewed.  PHYSICAL EXAMINATION: General:  Obese WF in NAD Neuro:  Intact HEENT:  No LAN Cardiovascular:  HSR RRR Lungs:  Coarse rhonchi , trach Abdomen:  + bs on tf Musculoskeletal:  intact Skin:  Warm and flushed  LABS: Cbc  Recent Labs Lab 09/10/12 0632 09/15/12 0740  WBC 6.2 5.6  HGB 10.6* 11.1*  HCT 31.6* 33.3*  PLT 143* 332   Chemistry  Recent Labs Lab 09/09/12 1654 09/15/12 0740  NA 136 134*  K 4.1 3.5  CL 96 94*  CO2 36* 31  BUN 47* 30*  CREATININE 0.49* 0.41*  CALCIUM 9.3 9.6  GLUCOSE 91 112*    Recent Labs Lab 09/09/12 0712  INR 0.93   ABG  Recent Labs Lab 09/15/12 0540  PHART 7.459*  PCO2ART 46.6*  PO2ART 64.4*  HCO3 32.6*  TCO2 34.0   IMAGING: Dg Chest Port 1 View  09/15/2012  *RADIOLOGY REPORT*  Clinical Data: ARDS.  PORTABLE CHEST - 1 VIEW  Comparison: 09/08/2012  Findings: Single view of the chest demonstrates diffuse interstitial lung densities.  Slightly improved aeration at the left lung but minimal change.  A feeding tube in the stomach region.  Tracheostomy tube is present.  Heart size is stable.  IMPRESSION: Minimal change in the bilateral interstitial lung densities.  Stable support apparatuses.   Original Report Authenticated By: Richarda Overlie, M.D.      ASSESSMENT / PLAN: VDRF secondary to H1N1 w/ ARDS, requiring  tracheostomy and prolonged vent support. PCCM consulted 1-22 for vent management.   PLAN:   -weaning per protocol on 09/12/2012  -PRN cxr -taper solumedrol, limited role -diurese as able -needs aggressive PT -will plan to allow pt to go on ATC / PMV continuous.  IF she tolerates ATC / PMV for next 48 -72 hours then trial of capping - likely on Monday 2/17  C diff -per ssh  Canary Brim, NP-C  Pulmonary & Critical Care Pgr: [phone] or [phone]  24 hours of TC at this point, downgrade to cuffless 6 and monitor for now, no decannulation for now.  Patient seen and examined, agree with above note.  I dictated the care and orders written for this patient under my direction.  Alyson Reedy, MD [phone]

## 2012-09-17 NOTE — Progress Notes (Signed)
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   LINES / TUBES: Trach ([facility])>>  CULTURES: Per ssh  ANTIBIOTICS: Per ssh   SIGNIFICANT EVENTS:  1/22 - ARDS net settings 2/07 - She is doing tracheostomy collar trials and the daytime 8-12 hours for the last few days. At night she is being rested on pressure support ventilation. Plan is to downsize her tracheostomy from #8 to #6 2/12 - Plan to cap trach  SUBJECTIVE: Off vent for > 48 hrs.  VITAL SIGNS: Reviewed.  PHYSICAL EXAMINATION: General:  Obese WF in NAD Neuro:  Intact HEENT:  No LAN Cardiovascular:  HSR RRR Lungs:  Coarse rhonchi , trach Abdomen:  + bs on tf Musculoskeletal:  intact Skin:  Warm and flushed  LABS: Cbc  Recent Labs Lab 09/15/12 0740  WBC 5.6  HGB 11.1*  HCT 33.3*  PLT 332   Chemistry  Recent Labs Lab 09/15/12 0740  NA 134*  K 3.5  CL 94*  CO2 31  BUN 30*  CREATININE 0.41*  CALCIUM 9.6  GLUCOSE 112*   No results found for this basename: APTT, INR,  in the last 168 hours ABG  Recent Labs Lab 09/15/12 0540  PHART 7.459*  PCO2ART 46.6*  PO2ART 64.4*  HCO3 32.6*  TCO2 34.0   IMAGING: Dg Swallowing Function  09/15/2012  CLINICAL DATA: evaluate swallowing function   FLUOROSCOPY FOR SWALLOWING FUNCTION STUDY:  Fluoroscopy was provided for swallowing function study, which was  administered by a speech pathologist.  Final results and recommendations  from this study are contained within the speech pathology report.       ASSESSMENT / PLAN: VDRF secondary to H1N1 w/ ARDS, requiring tracheostomy and prolonged vent support. PCCM consulted 1-22 for vent management.   PLAN:   -start trach cap protocol 2/12 >> hopefully de-cannulate soon -f/u CXR as needed -wean off prednisone as tolerated per primary team>> would like to get her off this soon -Even to  negative fluid balance -continue scheduled nebs  Coralyn Helling, MD [facility] Pulmonary/Critical Care 09/17/2012, 8:51 AM Pager:  [phone] After 3pm call: [phone]

## 2012-09-19 NOTE — Progress Notes (Signed)
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   LINES / TUBES: Trach ([facility])>>  CULTURES: Per ssh  ANTIBIOTICS: Per ssh   SIGNIFICANT EVENTS:  1/22 - ARDS net settings 2/07 - She is doing tracheostomy collar trials and the daytime 8-12 hours for the last few days. At night she is being rested on pressure support ventilation. Plan is to downsize her tracheostomy from #8 to #6 2/12 - Plan to cap trach 2/14 - decannulation  SUBJECTIVE: Feels like head is in fishbowl  VITAL SIGNS: Reviewed.  PHYSICAL EXAMINATION: General:  Obese WF in NAD Neuro:  Intact HEENT:  No LAN, L TM retracted Cardiovascular:  HSR RRR Lungs:  Coarse rhonchi , trach c/d/i Abdomen:  + bs on tf Musculoskeletal:  intact Skin:  Warm and flushed  LABS: Cbc  Recent Labs Lab 09/15/12 0740  WBC 5.6  HGB 11.1*  HCT 33.3*  PLT 332   Chemistry  Recent Labs Lab 09/15/12 0740  NA 134*  K 3.5  CL 94*  CO2 31  BUN 30*  CREATININE 0.41*  CALCIUM 9.6  GLUCOSE 112*   No results found for this basename: APTT, INR,  in the last 168 hours ABG  Recent Labs Lab 09/15/12 0540  PHART 7.459*  PCO2ART 46.6*  PO2ART 64.4*  HCO3 32.6*  TCO2 34.0   IMAGING: No results found.   ASSESSMENT / PLAN: VDRF secondary to H1N1 w/ ARDS, requiring tracheostomy and prolonged vent support. PCCM consulted 1-22 for vent management.   PLAN:   -start trach cap protocol 2/12 >> decannulate 2/14 -f/u CXR as needed -wean off prednisone as tolerated per primary team>> would like to get her off this soon -Even to negative fluid balance -continue scheduled nebs  Canary Brim, NP-C [place] Pulmonary & Critical Care Pgr: [phone] or [phone]  Decannulated and well tolerated, prednisone to wean quickly now that is off vent and decannulated.  Maintain fluid negative and continue nebs.   Will f/u on Monday.  Patient seen and examined, agree with above note.  I dictated the care and orders written for this patient under my direction.  Alyson Reedy, MD [phone]

## 2012-09-22 NOTE — Progress Notes (Signed)
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  Course complicated by C-Diff colitis.    LINES / TUBES: Trach ([facility])>>  CULTURES: Per ssh  ANTIBIOTICS: Per ssh   SIGNIFICANT EVENTS:  1/22 - ARDS net settings 2/07 - She is doing tracheostomy collar trials and the daytime 8-12 hours for the last few days. At night she is being rested on pressure support ventilation. Plan is to downsize her tracheostomy from #8 to #6 2/12 - Plan to cap trach 2/14 - decannulation 2/17 - no acute issues, PCCM s/o  SUBJECTIVE: Pt feels better, continues to be weak but much improved.  Janina Mayo out without issues.   VITAL SIGNS: Reviewed.  PHYSICAL EXAMINATION: General:  Obese WF in NAD Neuro:  Intact HEENT:  No LAN, L TM retracted Cardiovascular:  HSR RRR Lungs:  Coarse rhonchi , trach c/d/i Abdomen:  + bs on tf Musculoskeletal:  intact Skin:  Warm and flushed  LABS: Cbc No results found for this basename: WBC, HGB, HCT, PLT,  in the last 168 hours Chemistry No results found for this basename: NA, K, CL, CO2, BUN, CREATININE, CALCIUM, MG, PHOS, GLUCOSE,  in the last 168 hours No results found for this basename: APTT, INR,  in the last 168 hours ABG No results found for this basename: PHART, PCO2ART, PO2ART, HCO3, TCO2,  in the last 168 hours IMAGING: No results found.   ASSESSMENT / PLAN: VDRF secondary to H1N1 w/ ARDS, requiring tracheostomy and prolonged vent support. PCCM consulted 1-22 for vent management.   PLAN:   -trach cap 2/12 >> decannulated 2/14.  Tolerated well.  -f/u CXR as needed -wean off prednisone as tolerated per primary team -Even to negative fluid balance -continue scheduled nebs -likely will need CIR placement for further rehab efforts -trach site care  Canary Brim, NP-C [place] Pulmonary & Critical Care Pgr: [phone] or  [phone]    Reviewed above, examined pt, and agree with assessment/plan.  She is doing very well after decannulation.  Agree with plan for influenza vaccination prior to d/c.    PCCM will sign off.  Please call if further help needed.   Coralyn Helling, MD [facility] Pulmonary/Critical Care 09/22/2012, 3:52 PM Pager:  [phone] After 3pm call: [phone]

## 2012-09-23 ENCOUNTER — Other Ambulatory Visit: Payer: Self-pay | Admitting: Physician Assistant

## 2012-09-23 LAB — CLOSTRIDIUM DIFFICILE BY PCR: Toxigenic C. Difficile by PCR: NEGATIVE

## 2012-09-23 NOTE — PMR Pre-admission (Signed)
[facility] PMR Admission Coordinator Pre-Admission Assessment  Patient: [name] is an 57 y.o., female MRN: [id number] DOB: 1956/05/31 Height:   5'7" Weight: 113.399 kg (250 lb)  Insurance Information HMO:     PPO:      PCP:      IPA:      80/20:      OTHER: open access plus plan PRIMARY: [employer]      Policy#: [id number]      Subscriber: spouse CM Name: Vernard Gambles      Phone#: [phone] ext [phone]     Fax#: [phone] cert for 7 days update 10/28/38 Pre-Cert#: [id number]      Employer: [employer] Benefits:  Phone #: [phone]     Name: 2/17 Eliane Decree. Date: 08/06/10 active     Deduct: $1750/met      Out of Pocket Max: $3500/met      Life Max: unlimited CIR: 80% no day limit      SNF: 80% 120 days per year Outpatient: 80%     Co-Pay: 60 days combined Home Health: 80%      Co-Pay: 40 days combined DME: 80%     Co-Pay: 20% Providers: in network  SECONDARY: none      Medicaid Application Date:       Case Manager:  Disability Application Date:       Case Worker:   Emergency Conservator, museum/gallery Information   Name Relation Home Work Mobile   Westhoff,Jimmy Spouse [phone]  [phone]   Kitt,Whitney Daughter   [phone]      Current Medical History  Patient Admitting Diagnosis: critical illness myopathy  History of Present Illness: 57 yearold female with history HTN presented to the [facility] ED on 12/31 with hypoxemic respiratory failure secondary to H1N1 pneumonia.Treated with 5 days of tamiflu prior to admission. Intubated on 08/07/12 on the oscillator. ARDS 08/08/12 and placed prone.Treated for community acquired pneumonia, acute kidney injury . Discharged on 08/11/12 per helicopter to [facility] for further evaluation and management and possible ECMO. Also needs possible  CVVHD. Admitted to [facility] on 08/22/12 from [facility]. She did not require ECMO at [facility]. Treated for cdiff. Had tracheostomy placed at [facility]. Off dialysis and no PEG. PPCM  assisted management of vent at [facility]. Downsized trach 2/7 #8 to #6. 2/10 weaning vent on 28% ATC, tolerating PMV. Capped trach 2/12. decannulated 2/14. 2/17 on 2 L nasal cannula. Plan to wean off prednisone as tolerated.  Past Medical History  Past Medical History  Diagnosis Date  . Hypertension     Family History  family history is not on file.  Prior Rehab/Hospitalizations:none   Current Medications Calcium Carbonate Famotidine Furosemide Heparin Sodium Hydralazine HCL Metoprolol Tartrate Potassium Chloride Risaquad Sertraline HCL Temazepam   Patients Current Diet:  Regular diet with chopped meats and thin liquids  Precautions / Restrictions none   Prior Activity Level Community (5-7x/wk): active homemaker; cares for her 45 year old grandson, Jean Rosenthal Patient states she has issues with her knees bilaterally with her r knee worse than her left pta.  Home Assistive Devices / Equipment     Prior Functional Level Current Functional Level  Bed Mobility  Independent  Min assist   Transfers  Independent  Max assist   Mobility - Walk/Wheelchair  Independent  Total assist   Upper Body Dressing  Independent  Mod assist   Lower Body Dressing  Independent  Max assist   Grooming  Independent  Min assist   Eating/Drinking  Independent  Min Chemical engineer  Independent  Max assist   Bladder Continence   continent  continent but urgency   Bowel Management  continent  continent/bedpan   Stair Climbing  Able to Take Stairs?: Yes  Other   Communication  independent  independent   Memory  intact  intact   Cooking/Meal Prep  independent      Housework  independent    Money Management  independent    Driving  Yes    Patient now states her left side is weaker than her right and her knees are buckling when standing. Is able to sit in recliner for 2 hrs at a time over the past week on [facility].  Previous Home Environment Living  Arrangements: Spouse/significant other;Children (daughter, Bethann Qualley) Lives With: Spouse;Daughter;Other (Comment) (51 year old grandson, Ambulance person) Available Help at Discharge: Family Type of Home: House Home Layout: Two level;1/2 bath on main level;Bed/bath upstairs Alternate Level Stairs-Number of Steps: 15 steps to sencond floor Home Access: Stairs to enter [employer] of Steps: 4 to 5 steps Bathroom Shower/Tub: Health visitor: Standard Bathroom Accessibility: Yes How Accessible: Accessible via walker  Discharge Living Setting Plans for Discharge Living Setting: Patient's home;Lives with (comment) Type of Home at Discharge: House Discharge Home Layout: Two level;1/2 bath on main level;Bed/bath upstairs Alternate Level Stairs-Number of Steps: 15 steps to second level Discharge Home Access: Stairs to enter Entrance Stairs-Number of Steps: 4 to 5 steps Discharge Bathroom Shower/Tub: Walk-in shower Discharge Bathroom Toilet: Standard Discharge Bathroom Accessibility: Yes How Accessible: Accessible via walker  Social/Family/Support Systems Patient Roles: Spouse;Parent;Caregiver (for 57 year old grandson) Contact Information: Zenith Kercheval, spouse Anticipated Caregiver: spouse and daughter, Alphonzo Lemmings Anticipated Industrial/product designer Information: Jimmy h [phone]; cell [phone] Ability/Limitations of Caregiver: he is on short term disability for b knee issues since pt admitted. Scgeduled to go back to work in March Caregiver Availability: 24/7 Discharge Plan Discussed with Primary Caregiver: Yes Is Caregiver In Agreement with Plan?: Yes Does Caregiver/Family have Issues with Lodging/Transportation while Pt is in Rehab?: No Daughter, Alphonzo Lemmings is a Lawyer on [address] [employer] Two sons are assisting in the care of grandson so daughter and spouse  Can be at hospital to assist pt. Daughter has been assisting with bathing of pt daily. Spouse and dtr assisting with  bedpan.  Goals/Additional Needs Patient/Family Goal for Rehab: supervision PT, supervision to min assist OT, n/a SLP Expected length of stay: ELOS 2 to 3 weeks Dietary Needs: regular diet with chopped meats and thin liquids Special Service Needs: contact precautions for cdiff. treatment complete Pt/Family Agrees to Admission and willing to participate: Yes Program Orientation Provided & Reviewed with Pt/Caregiver Including Roles  & Responsibilities: Yes  Patient Condition: Discussed pt case with Dr. Riley Kill on 09/23/2012. Patient felt can benefit and is medically stable for admission to inpatient rehabilitation on 09/24/2012.  Preadmission Screen Completed By:  Clois Dupes, 09/24/2012 8:15 AM ______________________________________________________________________   Discussed status with Dr. Riley Kill  on  09/24/12  at  0815 and received telephone approval for admission today.  Admission Coordinator:  Clois Dupes, time  2956 Date  09/24/2012.   Assessment/Plan: Diagnosis: Critical illness myopathy 1. Does the need for close, 24 hr/day  Medical supervision in concert with the patient's rehab needs make it unreasonable for this patient to be served in a less intensive setting? Yes 2. Co-Morbidities requiring supervision/potential complications: htn, respiratory failuire, pneumonia, AKI 3. Due to bladder management, bowel management,  safety, skin/wound care, disease management, medication administration, pain management and patient education, does the patient require 24 hr/day rehab nursing? Yes 4. Does the patient require coordinated care of a physician, rehab nurse, PT (1-2 hrs/day, 5 days/week) and OT (1-2 hrs/day, 5 days/week) to address physical and functional deficits in the context of the above medical diagnosis(es)? Yes Addressing deficits in the following areas: balance, endurance, locomotion, strength, transferring, bowel/bladder control, bathing, dressing, feeding,  grooming, toileting and psychosocial support 5. Can the patient actively participate in an intensive therapy program of at least 3 hrs of therapy 5 days a week? Yes 6. The potential for patient to make measurable gains while on inpatient rehab is excellent 7. Anticipated functional outcomes upon discharge from inpatients are supervision to min assist PT, supervision to minimal assist OT, n/a SLP 8. Estimated rehab length of stay to reach the above functional goals is: 2-3 weeks 9. Does the patient have adequate social supports to accommodate these discharge functional goals? Yes 10. Anticipated D/C setting: Home 11. Anticipated post D/C treatments: HH therapy 12. Overall Rehab/Functional Prognosis: excellent    RECOMMENDATIONS: This patient's condition is appropriate for continued rehabilitative care in the following setting: CIR Patient has agreed to participate in recommended program. Yes Note that insurance prior authorization may be required for reimbursement for recommended care.  Comment:Admit to inpatient rehab today.  Ranelle Oyster, MD, [name] [facility]   Clois Dupes 09/24/2012

## 2012-09-24 ENCOUNTER — Encounter (HOSPITAL_COMMUNITY): Payer: Self-pay | Admitting: *Deleted

## 2012-09-24 ENCOUNTER — Other Ambulatory Visit: Payer: Self-pay | Admitting: Physician Assistant

## 2012-09-24 ENCOUNTER — Inpatient Hospital Stay (HOSPITAL_COMMUNITY)
Admission: RE | Admit: 2012-09-24 | Discharge: 2012-10-15 | DRG: 945 | Disposition: A | Discharge status: Home Health | Payer: Managed Care, Other (non HMO) | Source: Intra-hospital | Attending: Physical Medicine & Rehabilitation | Admitting: Physical Medicine & Rehabilitation

## 2012-09-24 DIAGNOSIS — J1000 Influenza due to other identified influenza virus with unspecified type of pneumonia: Secondary | ICD-10-CM | POA: Diagnosis present

## 2012-09-24 DIAGNOSIS — Z5189 Encounter for other specified aftercare: Principal | ICD-10-CM

## 2012-09-24 DIAGNOSIS — L723 Sebaceous cyst: Secondary | ICD-10-CM | POA: Diagnosis present

## 2012-09-24 DIAGNOSIS — R42 Dizziness and giddiness: Secondary | ICD-10-CM | POA: Diagnosis not present

## 2012-09-24 DIAGNOSIS — I824Z9 Acute embolism and thrombosis of unspecified deep veins of unspecified distal lower extremity: Secondary | ICD-10-CM | POA: Diagnosis not present

## 2012-09-24 DIAGNOSIS — A0472 Enterocolitis due to Clostridium difficile, not specified as recurrent: Secondary | ICD-10-CM | POA: Diagnosis present

## 2012-09-24 DIAGNOSIS — G729 Myopathy, unspecified: Secondary | ICD-10-CM

## 2012-09-24 DIAGNOSIS — R5381 Other malaise: Secondary | ICD-10-CM | POA: Diagnosis present

## 2012-09-24 DIAGNOSIS — I80299 Phlebitis and thrombophlebitis of other deep vessels of unspecified lower extremity: Secondary | ICD-10-CM

## 2012-09-24 DIAGNOSIS — N179 Acute kidney failure, unspecified: Secondary | ICD-10-CM | POA: Diagnosis present

## 2012-09-24 DIAGNOSIS — E669 Obesity, unspecified: Secondary | ICD-10-CM | POA: Diagnosis present

## 2012-09-24 DIAGNOSIS — G7281 Critical illness myopathy: Secondary | ICD-10-CM

## 2012-09-24 DIAGNOSIS — J9600 Acute respiratory failure, unspecified whether with hypoxia or hypercapnia: Secondary | ICD-10-CM | POA: Diagnosis present

## 2012-09-24 DIAGNOSIS — A419 Sepsis, unspecified organism: Secondary | ICD-10-CM | POA: Diagnosis present

## 2012-09-24 DIAGNOSIS — I10 Essential (primary) hypertension: Secondary | ICD-10-CM | POA: Diagnosis present

## 2012-09-24 LAB — CBC
MCH: 29.7 pg (ref 26.0–34.0)
MCV: 88.3 fL (ref 78.0–100.0)
Platelets: 264 10*3/uL (ref 150–400)
RDW: 17.2 % — ABNORMAL HIGH (ref 11.5–15.5)
WBC: 6.2 10*3/uL (ref 4.0–10.5)

## 2012-09-24 MED ORDER — RISAQUAD PO CAPS
1.0000 | ORAL_CAPSULE | Freq: Every day | ORAL | Status: DC
  Administered 2012-09-25 – 2012-10-15 (×21): 1 via ORAL
  Filled 2012-09-24 (×23): qty 1

## 2012-09-24 MED ORDER — BIOTENE DRY MOUTH MT LIQD
15.0000 mL | Freq: Two times a day (BID) | OROMUCOSAL | Status: DC
  Administered 2012-09-25 – 2012-10-14 (×16): 15 mL via OROMUCOSAL

## 2012-09-24 MED ORDER — SORBITOL 70 % SOLN: 30.0000 mL | Freq: Every day | Status: DC | PRN

## 2012-09-24 MED ORDER — SERTRALINE HCL 50 MG PO TABS
50.0000 mg | ORAL_TABLET | Freq: Every day | ORAL | Status: DC
  Administered 2012-09-24 – 2012-10-14 (×21): 50 mg via ORAL
  Filled 2012-09-24 (×22): qty 1

## 2012-09-24 MED ORDER — POTASSIUM CHLORIDE CRYS ER 20 MEQ PO TBCR
40.0000 meq | EXTENDED_RELEASE_TABLET | Freq: Every day | ORAL | Status: DC
  Administered 2012-09-25 – 2012-10-14 (×20): 40 meq via ORAL
  Filled 2012-09-24 (×23): qty 2

## 2012-09-24 MED ORDER — INFLUENZA VIRUS VACC SPLIT PF IM SUSP: 0.5000 mL | INTRAMUSCULAR | Status: DC

## 2012-09-24 MED ORDER — HYDRALAZINE HCL 25 MG PO TABS
25.0000 mg | ORAL_TABLET | Freq: Four times a day (QID) | ORAL | Status: DC
  Administered 2012-09-24 – 2012-10-01 (×26): 25 mg via ORAL
  Filled 2012-09-24 (×31): qty 1

## 2012-09-24 MED ORDER — HEPARIN SODIUM (PORCINE) 5000 UNIT/ML IJ SOLN
5000.0000 [IU] | Freq: Three times a day (TID) | INTRAMUSCULAR | Status: DC
  Administered 2012-09-24 – 2012-09-25 (×2): 5000 [IU] via SUBCUTANEOUS
  Filled 2012-09-24 (×5): qty 1

## 2012-09-24 MED ORDER — ONDANSETRON HCL 4 MG/2ML IJ SOLN: 4.0000 mg | Freq: Four times a day (QID) | INTRAMUSCULAR | Status: DC | PRN

## 2012-09-24 MED ORDER — CALCIUM CARBONATE ANTACID 500 MG PO CHEW
1.0000 | CHEWABLE_TABLET | Freq: Three times a day (TID) | ORAL | Status: DC
  Administered 2012-09-24 – 2012-10-15 (×60): 200 mg via ORAL
  Filled 2012-09-24 (×67): qty 1

## 2012-09-24 MED ORDER — FUROSEMIDE 40 MG PO TABS
40.0000 mg | ORAL_TABLET | Freq: Every day | ORAL | Status: DC
  Administered 2012-09-25 – 2012-10-08 (×14): 40 mg via ORAL
  Filled 2012-09-24 (×17): qty 1

## 2012-09-24 MED ORDER — ACETAMINOPHEN 325 MG PO TABS
325.0000 mg | ORAL_TABLET | ORAL | Status: DC | PRN
  Administered 2012-09-25: 650 mg via ORAL
  Filled 2012-09-24: qty 2

## 2012-09-24 MED ORDER — PANTOPRAZOLE SODIUM 40 MG PO TBEC
40.0000 mg | DELAYED_RELEASE_TABLET | Freq: Every day | ORAL | Status: DC
  Administered 2012-09-25 – 2012-10-15 (×21): 40 mg via ORAL
  Filled 2012-09-24 (×19): qty 1

## 2012-09-24 MED ORDER — METOPROLOL TARTRATE 25 MG PO TABS
37.5000 mg | ORAL_TABLET | Freq: Two times a day (BID) | ORAL | Status: DC
  Administered 2012-09-24 – 2012-10-14 (×38): 37.5 mg via ORAL
  Filled 2012-09-24 (×47): qty 1

## 2012-09-24 MED ORDER — SENNA 8.6 MG PO TABS
1.0000 | ORAL_TABLET | Freq: Two times a day (BID) | ORAL | Status: DC
  Administered 2012-09-24 – 2012-10-06 (×22): 8.6 mg via ORAL
  Filled 2012-09-24 (×34): qty 1

## 2012-09-24 MED ORDER — ONDANSETRON HCL 4 MG PO TABS
4.0000 mg | ORAL_TABLET | Freq: Four times a day (QID) | ORAL | Status: DC | PRN
  Administered 2012-09-26 – 2012-10-14 (×4): 4 mg via ORAL
  Filled 2012-09-24 (×2): qty 1
  Filled 2012-09-24: qty 17
  Filled 2012-09-24: qty 1

## 2012-09-24 NOTE — Plan of Care (Addendum)
Overall Plan of Care [facility]) Patient Details Name: [name] MRN: [id number] DOB: [birth year]  Diagnosis:  Critical illness myopathy  Co-morbidities: AVDRF, sepsis, DVT's. ?PE  Functional Problem List  Patient demonstrates impairments in the following areas: Balance, Endurance and Motor  Basic ADL's: eating, grooming, bathing, dressing and toileting Advanced ADL's: simple meal preparation and light housekeeping  Transfers:  bed mobility, bed to chair, car and furniture Locomotion:  ambulation, wheelchair mobility and stairs  Additional Impairments:  Discharge Disposition  Anticipated Outcomes Item Anticipated Outcome  Eating/Swallowing    Basic self-care  Min assist  Tolieting  Min assist  Bowel/Bladder  Continent of bowel and bladder  Transfers  Supervision / min assist toileting  Locomotion  Supervision  Communication    Cognition    Pain  </=2  Safety/Judgment  No falls with injury  Other     Therapy Plan: PT Intensity: Minimum of 1-2 x/day ,45 to 90 minutes PT Frequency: 5 out of 7 days PT Duration Estimated Length of Stay: 1.5- 2 weeks OT Intensity: Minimum of 1-2 x/day, 45 to 90 minutes OT Frequency: 5 out of 7 days OT Duration/Estimated Length of Stay: 2-2.5 weeks      Team Interventions: Item RN PT OT SLP SW TR Other  Self Care/Advanced ADL Retraining   x      Neuromuscular Re-Education  x x      Therapeutic Activities  x x   x   UE/LE Strength Training/ROM  x x   x   UE/LE Coordination Activities   x   x   Visual/Perceptual Remediation/Compensation         DME/Adaptive Equipment Instruction  x x   x   Therapeutic Exercise  x x   x   Balance/Vestibular Training  x x   x   Patient/Family Education x x x   x   Cognitive Remediation/Compensation         Functional Mobility Training  x x   x   Ambulation/Gait Training  x       Stair Training  x       Wheelchair Propulsion/Positioning  x    x   Functional Administrator Reintegration  x x   x   Dysphagia/Aspiration Film/video editor         Bladder Management x        Bowel Management x        Disease Management/Prevention x        Pain Management x  x      Medication Management x        Skin Care/Wound Management x        Splinting/Orthotics         Discharge Planning x x x   x   Psychosocial Support x x x   x                          Team Discharge Planning: Destination: PT-Home ,OT- Home , SLP-  Projected Follow-up: PT-Home health PT (vs outpatient PT pending progress), OT-  Home health OT, SLP-  Projected Equipment Needs: PT-Rolling walker with 5" wheels, OT- Tub/shower seat;Other (comment), SLP-  Patient/family involved in discharge planning: PT- Patient;Family member/caregiver,  OT-Patient;Family member/caregiver, SLP-   MD ELOS: 2 to 2.5 weeks Medical Rehab Prognosis:  Excellent Assessment: The patient has been  admitted for CIR therapies. The team will be addressing, functional mobility, strength, stamina, balance, safety, adaptive techniques/equipment, self-care, bowel and bladder mgt, patient and caregiver education, cardiopulmonary stamina, pain mgt, edema control potentially. Goals have been set at supervision to minimal assistance. Pt is quite motivated, her initial therapy has been sidetracked by discovery of multiple lower extremity dvt's.      See Team Conference Notes for weekly updates to the plan of care

## 2012-09-24 NOTE — H&P (Signed)
Physical Medicine and Rehabilitation Admission H&P   Chief complaint: severe weakness   HPI: 57 year old right-handed white female with history of hypertension admitted to [facility] 08/05/2012 with hypoxemic respiratory failure presumably from H1N1 pneumonia. Patient noted fever, chills and nausea with decreased by mouth intake as well as increasing shortness of breath. She had recently been placed on Tamiflu x5 days but not tested for influenza prior to admission. In the emergency department started on BiPAP. Chest x-ray with diffuse bilateral airspace disease/consolidation right upper lobe without effusion. Placed on broad-spectrum antibiotics. Noted acute renal failure creatinine 2.04 and placed on gentle IV fluids. Renal ultrasound negative for hydronephrosis. She required intubation on 08/08/2011 VDRL suspect septic shock and steroid protocol initiated. Tracheostomy performed for prolonged ventilatory support and he cannulated 09/19/2012 without difficulty followed by critical care medicine. A nasogastric tube remained in place for nutritional support which was later removed and diet advanced to a regular consistency. She was transferred to [facility] for [facility] evaluation 08/11/2012 and not felt to be required but did receive hemodialysis for a short time while at [facility]. [facility] for ongoing care. Creatinine continued to improve with latest creatinine 0.41. 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 Subcutaneous heparin for DVT prophylaxis. [facility] she developed Clostridium difficile diarrhea and completed a course of vancomycin and Flagyl 09/23/2012. Her endurance continues to be limited as well as fatigue factors. Patient requires minimum to moderate assist for supine and getting to the edge the bed. As well as moderate to max  assist for bed to chair transfers. Patient was felt to be a good candidate for inpatient rehabilitation services and was admitted for comprehensive rehabilitation program today Review of Systems  Constitutional: Positive for fever, chills and malaise/fatigue.  Respiratory: Positive for cough and shortness of breath.  Gastrointestinal: Positive for nausea.  Neurological: Positive for weakness.  All other systems reviewed and are negative.   Past Medical History   Diagnosis  Date   .  Hypertension     Past Surgical History   Procedure  Laterality  Date   .  No past surgeries      No family history on file.  Social History: reports that she has never smoked. She does not have any smokeless tobacco history on file. She reports that she does not drink alcohol or use illicit drugs.  Allergies:  Allergies   Allergen  Reactions   .  Aspirin  Nausea And Vomiting   .  Sulfa Antibiotics      nausea    Medications Prior to Admission   Medication  Sig  Dispense  Refill   .  albuterol (PROVENTIL HFA;VENTOLIN HFA) 108 (90 BASE) MCG/ACT inhaler  Inhale 2 puffs into the lungs every 6 (six) hours as needed. For shortness of breath     .  ibuprofen (ADVIL,MOTRIN) 400 MG tablet  Take 400-800 mg by mouth every 8 (eight) hours as needed. For fever/pain     .  lisinopril (PRINIVIL,ZESTRIL) 10 MG tablet  Take 10 mg by mouth daily.     .  medroxyPROGESTERone (PROVERA) 10 MG tablet  Take 10 mg by mouth daily.      Home:  Home Living  Lives With: Spouse;Daughter;Other (Comment) (27 year old grandson, Jean Rosenthal)  Available Help at Discharge: Family  Type of Home: House  Home Access: Stairs to enter  [employer]  of Steps: 4 to 5 steps  Home Layout: Two level;1/2 bath on main level;Bed/bath upstairs  Alternate Level Stairs-Number of Steps: 15 steps to sencond floor  Bathroom Shower/Tub: Pension scheme manager: Standard  Bathroom Accessibility: Yes  How Accessible: Accessible via walker   Functional History:  Prior Function  Able to Take Stairs?: Yes  Driving: Yes  Vocation: Other (comment) (homemaker)  Functional Status:  Mobility:  requires minimum to moderate assist for supine to sit edge of bed. Bed to wheelchair with moderate to max assist of one. She was able to set his bed x15 minutes with standby assist.     ADL:  mod max assist for lower body activities of daily living  Cognition:  Cognition  Orientation Level: Oriented X4   Physical Exam:  Weight 113.399 kg (250 lb).  BP 110/60 P 78 RR 18 T 98 4 oxygen saturations 92% on 1 L oxygen  Physical Exam  Vitals reviewed.  Constitutional: She is oriented to person, place, and time. She appears well-developed. obese HENT:  Head: Normocephalic, oral mucosa pink and moist.  Eyes:  Pupils round and reactive to light  Neck: Normal range of motion. Neck supple. No thyromegaly present.  Cardiovascular: Normal rate and regular rhythm. No murmurs. Pulmonary/Chest: Effort normal and breath sounds normal. No respiratory distress. She has no wheezes.  Abdominal: Soft. Bowel sounds are normal. She exhibits no distension. There is no tenderness.  Musculoskeletal:  +1 edema lower extremities.  NEURO: Decreased sensation to light touch bilaterally at toes. UE 4/5 deltoids, biceps, triceps, WE, HI. LE 2+ HF, 3 KE, 4/5 ADF and APF. DTR'S are 1+.    She is alert and oriented to person, place, and time.  Follows full commands. Has excellent insight and awareness. Skin: Skin is warm and dry.  Tracheostomy site well healed  Psychiatric: She has a normal mood and affect.   Results for orders placed during the hospital encounter of 08/22/12 (from the past 48 hour(s))   CLOSTRIDIUM DIFFICILE BY PCR Status: None    Collection Time    09/23/12 7:45 AM   Result  Value  Range    C difficile by pcr  NEGATIVE  NEGATIVE    No results found.  Post Admission Physician Evaluation:  1. Functional deficits secondary to critical  illness myopathy after H1N1 pneumonia with respiratory failure and sepsis.. 2. Patient is admitted to receive collaborative, interdisciplinary care between the physiatrist, rehab nursing staff, and therapy team. 3. Patient's level of medical complexity and substantial therapy needs in context of that medical necessity cannot be provided at a lesser intensity of care such as a SNF. 4. Patient has experienced substantial functional loss from his/her baseline which was documented above under the "Functional History" and "Functional Status" headings. Judging by the patient's diagnosis, physical exam, and functional history, the patient has potential for functional progress which will result in measurable gains while on inpatient rehab. These gains will be of substantial and practical use upon discharge in facilitating mobility and self-care at the household level. 5. Physiatrist will provide 24 hour management of medical needs as well as oversight of the therapy plan/treatment and provide guidance as appropriate regarding the interaction of the two. 6. 24 hour rehab nursing will assist with bladder management, bowel management, safety, skin/wound care, disease management, medication administration, pain management and patient education and help integrate therapy concepts, techniques,education, etc. 7. PT will assess and treat for/with: Lower extremity strength, range of motion, stamina, balance, functional mobility,  safety, adaptive techniques and equipment, NMR, education. Goals are: supervision to mod I. 8. OT will assess and treat for/with: ADL's, functional mobility, safety, upper extremity strength, adaptive techniques and equipment, NMR, education. Goals are: mod I to min assist. 9. SLP will assess and treat for/with: n/a. Goals are: n/a. 10. Case Management and Social Worker will assess and treat for psychological issues and discharge planning. 11. Team conference will be held weekly to assess progress  toward goals and to determine barriers to discharge. 12. Patient will receive at least 3 hours of therapy per day at least 5 days per week. 13. ELOS: 2-3 weeks Prognosis: excellent. She is quite motivated   Medical Problem List and Plan:  1. Deconditioning/VDRF/critical illness myopathy/septic shock  2. DVT Prophylaxis/Anticoagulation: Subcutaneous heparin. Monitor platelet counts any signs of bleeding. Check venous Doppler study  3. Mood: Zoloft 25 mg daily. Provide emotional support and positive reinforcement  4. Neuropsych: This patient is capable of making decisions on her own behalf.  5. Tracheostomy. Decannulated 09/19/2012. Check oxygen saturations every shift. Comfortable at present 6. Acute renal failure-resolved. Followup chemistries  7. Clostridium difficile diarrhea. Vancomycin and Flagyl completed 09/23/2012. Follow up testing has been negative 8. Hypertension. Lasix 40 mg daily, hydralazine 25 mg 4 times daily. Monitor with increased mobility. Normotensive at present.  Ranelle Oyster, MD, [name] Dom  09/23/2012

## 2012-09-24 NOTE — Progress Notes (Signed)
Due to patient's medical history, patient is not to receive the flu vaccine per Deatra Ina, PA.  Will continue to monitor.

## 2012-09-24 NOTE — Progress Notes (Signed)
Patient arrived via stretcher from [facility].  Alert and oriented x4, denies pain.  Vitals stable.  Bruising noted to abdomen, LUE, and BLE.  Scab intact to anterior neck from former trach site.  Patient and family oriented to the room and to the unit.  Bed alarm activated.  Will continue to monitor.

## 2012-09-25 ENCOUNTER — Inpatient Hospital Stay (HOSPITAL_COMMUNITY): Payer: Managed Care, Other (non HMO) | Admitting: Physical Therapy

## 2012-09-25 ENCOUNTER — Inpatient Hospital Stay (HOSPITAL_COMMUNITY): Payer: Managed Care, Other (non HMO) | Admitting: *Deleted

## 2012-09-25 ENCOUNTER — Inpatient Hospital Stay (HOSPITAL_COMMUNITY): Payer: Managed Care, Other (non HMO) | Admitting: Occupational Therapy

## 2012-09-25 ENCOUNTER — Inpatient Hospital Stay (HOSPITAL_COMMUNITY): Payer: Managed Care, Other (non HMO)

## 2012-09-25 DIAGNOSIS — G729 Myopathy, unspecified: Secondary | ICD-10-CM

## 2012-09-25 DIAGNOSIS — G7281 Critical illness myopathy: Secondary | ICD-10-CM

## 2012-09-25 DIAGNOSIS — I82409 Acute embolism and thrombosis of unspecified deep veins of unspecified lower extremity: Secondary | ICD-10-CM

## 2012-09-25 DIAGNOSIS — J9600 Acute respiratory failure, unspecified whether with hypoxia or hypercapnia: Secondary | ICD-10-CM

## 2012-09-25 DIAGNOSIS — I80299 Phlebitis and thrombophlebitis of other deep vessels of unspecified lower extremity: Secondary | ICD-10-CM

## 2012-09-25 LAB — CBC WITH DIFFERENTIAL/PLATELET
Basophils Absolute: 0 10*3/uL (ref 0.0–0.1)
Eosinophils Relative: 1 % (ref 0–5)
HCT: 33.3 % — ABNORMAL LOW (ref 36.0–46.0)
Lymphocytes Relative: 17 % (ref 12–46)
MCHC: 33.3 g/dL (ref 30.0–36.0)
MCV: 88.8 fL (ref 78.0–100.0)
Monocytes Absolute: 0.5 10*3/uL (ref 0.1–1.0)
Monocytes Relative: 9 % (ref 3–12)
RDW: 17.4 % — ABNORMAL HIGH (ref 11.5–15.5)
WBC: 5.4 10*3/uL (ref 4.0–10.5)

## 2012-09-25 LAB — COMPREHENSIVE METABOLIC PANEL
AST: 29 U/L (ref 0–37)
BUN: 16 mg/dL (ref 6–23)
CO2: 28 mEq/L (ref 19–32)
Calcium: 9.7 mg/dL (ref 8.4–10.5)
Creatinine, Ser: 0.58 mg/dL (ref 0.50–1.10)
GFR calc Af Amer: >90 mL/min (ref >90)
GFR calc non Af Amer: >90 mL/min (ref >90)

## 2012-09-25 MED ORDER — OXYCODONE HCL 5 MG PO TABS
5.0000 mg | ORAL_TABLET | ORAL | Status: DC | PRN
  Administered 2012-09-25 – 2012-10-15 (×29): 5 mg via ORAL
  Filled 2012-09-25 (×17): qty 1
  Filled 2012-09-25: qty 2
  Filled 2012-09-25 (×13): qty 1

## 2012-09-25 MED ORDER — RIVAROXABAN 15 MG PO TABS
15.0000 mg | ORAL_TABLET | Freq: Two times a day (BID) | ORAL | Status: DC
  Administered 2012-09-25 – 2012-10-15 (×40): 15 mg via ORAL
  Filled 2012-09-25 (×46): qty 1

## 2012-09-25 MED ORDER — RIVAROXABAN 20 MG PO TABS
20.0000 mg | ORAL_TABLET | Freq: Every day | ORAL | Status: DC
  Filled 2012-09-25: qty 1

## 2012-09-25 MED ORDER — RIVAROXABAN 15 MG PO TABS: 15.0000 mg | ORAL_TABLET | Freq: Every day | ORAL | Status: DC

## 2012-09-25 NOTE — Progress Notes (Signed)
Physical Therapy Session Note  Patient Details  Name: [name] MRN: [id number] Date of Birth: 10/04/1955  Today's Date: 09/25/2012 Time: 0930-1015 Time Calculation (min): 45 min  Short Term Goals: Week 1:  PT Short Term Goal 1 (Week 1): Pt will tolerate 3 min continuous standing activity with min assist PT Short Term Goal 2 (Week 1): Pt will propel wheelchair x 200' continuous with supervision PT Short Term Goal 3 (Week 1): Pt will perform bed > chair transfer squat pivot with supervision PT Short Term Goal 4 (Week 1): Pt will perform bed > chair transfer stand pivot with mod assist  Skilled Therapeutic Interventions/Progress Updates:  Patient received sitting in wheelchair. Initial SpO2: 97% on 2L O2 via [place]. Patient instructed in wheelchair mobility x75' with one short rest break. SpO2: 94% after wheelchair mobility. Patient performed seated LE there ex: B hip flexion, B knee extension, B ankle pumps, B heel raises, B hip adduction with 3" hold, all 2 sets x20 reps each.   Attempted sit>stand to rolling walker, but unable with one person. Set of stairs placed in front of patient to allow her to use B UE to pull up to try and stand. Patient performed 5 reps of sit>squat with max assist, patient able to partially clear wheelchair cushion. Patient c/o fatigue and SOB. SpO2: 93%, HR: 102.  Checked again after 1 minute, SpO2: 95%, HR: 90. Patient states she feels better after rest break.  Patient returned to room and left seated in wheelchair with all needs within reach.  Therapy Documentation Precautions:  Precautions Precautions: Fall Precaution Comments: Severe deconditioning, rest breaks with all activity, on 2L O2 Restrictions Weight Bearing Restrictions: No Pain: Pain Assessment Pain Assessment: No/denies pain Locomotion : Ambulation Ambulation: No (Attempted, pt unable secondary to deconditioning. ) Ambulation/Gait Assistance: Not tested (comment) Wheelchair  Mobility Wheelchair Mobility: Yes Wheelchair Assistance: 4: Administrator, sports Details: Verbal cues for technique;Verbal cues for Engineer, drilling: Both upper extremities Wheelchair Parts Management: Needs assistance Distance: 75   See FIM for current functional status  Therapy/Group: Individual Therapy  Chipper Herb. Krystyn Picking, PT, DPT  09/25/2012, 10:50 AM

## 2012-09-25 NOTE — Progress Notes (Signed)
Occupational Therapy Note  Patient Details  Name: [name] MRN: [id number] Date of Birth: [birth year] Today's Date: 09/25/2012  Pt missed 30 mins skilled OT services.  Pt on MD ordered bedrest BLE DVTs.   [facility] 09/25/2012, 1:15 PM

## 2012-09-25 NOTE — Evaluation (Signed)
Occupational Therapy Assessment and Plan  Patient Details  Name: [name] MRN: [id number] Date of Birth: 02-Oct-1955  OT Diagnosis: muscular wasting and disuse atrophy, muscle weakness (generalized), pain in joint and swelling of limb Rehab Potential: Rehab Potential: Good ELOS: 2-2.5 weeks   Today's Date: 09/25/2012 Time: 7829-5621 Time Calculation (min): 65 min  Problem List:  Patient Active Problem List  Diagnosis  . Acute respiratory failure with hypoxia  . Influenza-like illness  . CAP (community acquired pneumonia)  . AKI (acute kidney injury)  . Hypertension  . Chronic respiratory failure  . Tracheostomy status  . Myopathy    Past Medical History:  Past Medical History  Diagnosis Date  . Hypertension    Past Surgical History:  Past Surgical History  Procedure Laterality Date  . Cyst excision  1980    Spinal cyst  . Tonsillectomy  1980    Assessment & Plan Clinical Impression: Patient is a 57 year old right-handed white female with history of hypertension admitted to [facility] 08/05/2012 with hypoxemic respiratory failure presumably from H1N1 pneumonia. Patient noted fever, chills and nausea with decreased by mouth intake as well as increasing shortness of breath. She had recently been placed on Tamiflu x5 days but not tested for influenza prior to admission. In the emergency department started on BiPAP. Chest x-ray with diffuse bilateral airspace disease/consolidation right upper lobe without effusion. Placed on broad-spectrum antibiotics. Noted acute renal failure creatinine 2.04 and placed on gentle IV fluids. Renal ultrasound negative for hydronephrosis. She required intubation on 08/08/2011 VDRL suspect septic shock and steroid protocol initiated. Tracheostomy performed for prolonged ventilatory support and he cannulated 09/19/2012 without difficulty followed by critical care medicine. A nasogastric tube remained in place for nutritional support which was  later removed and diet advanced to a regular consistency. She was transferred to [facility] for ECMO evaluation 08/11/2012 and not felt to be required but did receive hemodialysis for a short time while at [facility]. [facility] for ongoing care. Creatinine continued to improve with latest creatinine 0.41. 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 Subcutaneous heparin for DVT prophylaxis. [facility] she developed Clostridium difficile diarrhea and completed a course of vancomycin and Flagyl 09/23/2012. Her endurance continues to be limited as well as fatigue factors. Patient requires minimum to moderate assist for supine and getting to the edge the bed. As well as moderate to max assist for bed to chair transfers. Patient was felt to be a good candidate for inpatient rehabilitation services and was admitted for comprehensive rehabilitation program.  Patient transferred to CIR on 09/24/2012 .    Patient currently requires total with basic self-care skills secondary to muscle weakness and decreased cardiorespiratoy endurance and decreased oxygen support.  Prior to hospitalization, patient could complete basic ADL/IADL with independent .  Patient will benefit from skilled intervention to decrease level of assist with basic self-care skills and increase independence with basic self-care skills prior to discharge home with care partner.  Anticipate patient will require intermittent supervision and minimal physical assistance and follow up home health.  OT - End of Session Activity Tolerance: Decreased this session Endurance Deficit: Yes Endurance Deficit Description: Frequent rest breaks, constant O2 2L,  OT Assessment Rehab Potential: Good Barriers to Discharge: Inaccessible home environment Barriers to Discharge Comments: Bed and bath on 2nd  floor OT Plan OT Intensity: Minimum of 1-2 x/day, 45 to 90  minutes OT Frequency: 5 out of 7 days OT Duration/Estimated Length of Stay: 2-2.5 weeks OT Treatment/Interventions: Balance/vestibular training;Discharge planning;DME/adaptive equipment instruction;Functional mobility training;Pain management;Psychosocial support;Therapeutic Activities;UE/LE Strength taining/ROM;Therapeutic Exercise;Self Care/advanced ADL retraining;Patient/family education;Neuromuscular re-education;Community reintegration OT Recommendation Recommendations for Other Services: Neuropsych consult Patient destination: Home Follow Up Recommendations: Home health OT Equipment Recommended: Tub/shower seat;Other (comment) Equipment Details: drop arm commode   Skilled Therapeutic Intervention Patient seen this am for OT evaluation, and self care retraining.  Assisted patient to shower with squat pivot transfer in tub/shower onto tub bench.  Patient with limited ability to recruit leg muscle activity for transfer.  Patient somewhat anxious regarding activity, yet willing to try to work through fatigue.  Patient spontaneously stopped activity to improve breathing, pursed lip breathing.  O2 saturation maintained at 97% on 2l/min O2 via [place].  Focus of session to encourage forward lean, and hip driven activity for functional mobility at this time.  OT Evaluation Precautions/Restrictions  Precautions Precautions: Fall (O2 via nasal cannula) Precaution Comments: 02 Restrictions Weight Bearing Restrictions: No General Amount of Missed OT Time (min): 30 Minutes Response to Previous Treatment: Patient reporting fatigue but able to participate Family/Caregiver Present: Yes ([name]) Vital Signs Oxygen Therapy SpO2: 97 % O2 Device: Nasal cannula O2 Flow Rate (L/min): 2 L/min Pulse Oximetry Type: Intermittent Pain Pain Assessment Pain Assessment: No/denies pain Home Living/Prior Functioning Home Living Lives With:  Spouse;Daughter;Family Available Help at Discharge: Family Type of Home: House Home Access: Stairs to enter Secretary/administrator of Steps: 5 Entrance Stairs-Rails: Right Home Layout: Two level (full bath on second floor, bedroom on second floor) Alternate Level Stairs-Number of Steps: 15 steps to second floor Alternate Level Stairs-Rails: Right (then switches to Lt. half way up) Bathroom Shower/Tub: Health visitor: Standard Bathroom Accessibility: Yes How Accessible: Accessible via walker Home Adaptive Equipment: None IADL History Homemaking Responsibilities: Yes Meal Prep Responsibility: Primary Shopping Responsibility: Primary Current License: Yes Mode of Transportation: Car Leisure and Hobbies: likes spending time with family Prior Function Level of Independence: Independent with basic ADLs;Independent with homemaking with ambulation;Independent with gait;Independent with transfers Able to Take Stairs?: Yes Driving: Yes Vocation: Other (comment) (homemaker) Leisure: Hobbies-yes (Comment) Comments: Taking care of family and grandchild. ADL   Vision/Perception  Vision - History Baseline Vision: No visual deficits Patient Visual Report: No change from baseline Vision - Assessment Eye Alignment: Within Functional Limits Perception Perception: Within Functional Limits Praxis Praxis: Intact  Cognition Attention: Selective Selective Attention: Appears intact Memory: Appears intact Awareness: Appears intact Problem Solving: Appears intact Safety/Judgment: Appears intact Comments: somewhat anxious Sensation Sensation Light Touch: Appears Intact Stereognosis: Appears Intact Hot/Cold: Appears Intact Proprioception: Appears Intact Coordination Gross Motor Movements are Fluid and Coordinated: Yes Fine Motor Movements are Fluid and Coordinated: Yes (mild tremor) Motor  Motor Motor: Other (comment) Motor - Skilled Clinical Observations:  (Left sided  weakness UE>LE)     Trunk/Postural Assessment  Cervical Assessment Cervical Assessment: Within Functional Limits Thoracic Assessment Thoracic Assessment: Within Functional Limits Lumbar Assessment Lumbar Assessment: Within Functional Limits Postural Control Postural Control: Deficits on evaluation (Inability to use both hands in function overhead)  Balance Balance Balance Assessed: Yes Static Sitting Balance Static Sitting - Balance Support: Left upper extremity supported;Feet supported Static Sitting - Level of Assistance: 5: Stand by assistance Static Sitting - Comment/# of Minutes: 15 min Static Standing Balance Static Standing - Balance Support: Bilateral upper extremity supported;During functional activity Static Standing - Level of Assistance: 1: +1 Total assist Static Standing - Comment/# of  Minutes: 3 seconds, not full stand Extremity/Trunk Assessment RUE Assessment RUE Assessment: Within Functional Limits LUE Assessment LUE Assessment: Within Functional Limits (PROM)  FIM:  FIM - Grooming Grooming Steps: Wash, rinse, dry face;Wash, rinse, dry hands Grooming: 3: Patient completes 2 of 4 or 3 of 5 steps FIM - Bathing Bathing Steps Patient Completed: Chest;Right Arm;Left Arm;Abdomen;Left upper leg;Right lower leg (including foot) Bathing: 3: Mod-Patient completes 5-7 74f 10 parts or 50-74% FIM - Upper Body Dressing/Undressing Upper body dressing/undressing steps patient completed: Thread/unthread right bra strap;Thread/unthread left bra strap;Thread/unthread right sleeve of pullover shirt/dresss;Thread/unthread left sleeve of pullover shirt/dress;Put head through opening of pull over shirt/dress Upper body dressing/undressing: 4: Min-Patient completed 75 plus % of tasks FIM - Lower Body Dressing/Undressing Lower body dressing/undressing: 1: Two helpers FIM - Toileting Toileting: 0: Activity did not occur FIM - Banker Devices:  Arm rests;Walker Bed/Chair Transfer: 3: Supine > Sit: Mod A (lifting assist/Pt. 50-74%/lift 2 legs;4: Sit > Supine: Min A (steadying pt. > 75%/lift 1 leg);2: Bed > Chair or W/C: Max A (lift and lower assist) FIM - Secretary/administrator Devices: Tub transfer bench Tub/shower Transfers: 7-Independent: No helper;1-Out of Tub/Shower: Total A (helper does all/Pt. < 25%)   Refer to Care Plan for Long Term Goals  Recommendations for other services: Neuropsych  Discharge Criteria: Patient will be discharged from OT if patient refuses treatment 3 consecutive times without medical reason, if treatment goals not met, if there is a change in medical status, if patient makes no progress towards goals or if patient is discharged from hospital.  The above assessment, treatment plan, treatment alternatives and goals were discussed and mutually agreed upon: by patient and by family  Collier Salina 09/25/2012, 1:49 PM

## 2012-09-25 NOTE — Progress Notes (Signed)
*  PRELIMINARY RESULTS* Vascular Ultrasound Lower extremity venous duplex has been completed.  Preliminary findings: Bilaterally extensive DVT involving the common femoral, femoral, popliteal, posterior tibial, and peroneal veins.  Farrel Demark, RDMS, RVT  09/25/2012, 12:34 PM

## 2012-09-25 NOTE — Progress Notes (Signed)
Subjective/Complaints: Had a good night. No complaints. Breathing comfortably.  A 12 point review of systems has been performed and if not noted above is otherwise negative.   Objective: Vital Signs: Blood pressure 116/74, pulse 81, temperature 98.2 F (36.8 C), temperature source Oral, resp. rate 18, height 5\' 7"  (1.702 m), weight 99.746 kg (219 lb 14.4 oz), SpO2 97.00%. No results found.  Recent Labs  09/24/12 1630 09/25/12 0645  WBC 6.2 5.4  HGB 11.7* 11.1*  HCT 34.8* 33.3*  PLT 264 233    Recent Labs  09/24/12 1630 09/25/12 0645  NA  --  139  K  --  4.5  CL  --  102  GLUCOSE  --  109*  BUN  --  16  CREATININE 0.77 0.58  CALCIUM  --  9.7   CBG (last 3)  No results found for this basename: GLUCAP,  in the last 72 hours  Wt Readings from Last 3 Encounters:  09/24/12 99.746 kg (219 lb 14.4 oz)  09/23/12 113.399 kg (250 lb)  08/11/12 168.9 kg (372 lb 5.7 oz)    Physical Exam:  Constitutional: She is oriented to person, place, and time. She appears well-developed. obese  HENT:  Head: Normocephalic, oral mucosa pink and moist.  Eyes:  Pupils round and reactive to light  Neck: Normal range of motion. Neck supple. No thyromegaly present.  Cardiovascular: Normal rate and regular rhythm. No murmurs.  Pulmonary/Chest: Effort normal and breath sounds normal. No respiratory distress. She has no wheezes.  Abdominal: Soft. Bowel sounds are normal. She exhibits no distension. There is no tenderness.  Musculoskeletal:  +1 edema lower extremities.  NEURO: Decreased sensation to light touch bilaterally at toes. UE 4/5 deltoids, biceps, triceps, WE, HI. LE 2+ HF, 3 KE, 4/5 ADF and APF. DTR'S are 1+.  She is alert and oriented to person, place, and time.  Follows full commands. Has excellent insight and awareness.  Skin: Skin is warm and dry.  Tracheostomy site well healed  Psychiatric: She has a normal mood and affect.    Assessment/Plan: 1. Functional deficits secondary  to critical illness myopathy and deconditioning after VDRF/ septic shock which require 3+ hours per day of interdisciplinary therapy in a comprehensive inpatient rehab setting. Physiatrist is providing close team supervision and 24 hour management of active medical problems listed below. Physiatrist and rehab team continue to assess barriers to discharge/monitor patient progress toward functional and medical goals.  Pt appears quite motivated to participate in therapies!  FIM:                   Comprehension Comprehension Mode: Auditory Comprehension: 6-Follows complex conversation/direction: With extra time/assistive device  Expression Expression Mode: Verbal Expression: 6-Expresses complex ideas: With extra time/assistive device  Social Interaction Social Interaction: 6-Interacts appropriately with others with medication or extra time (anti-anxiety, antidepressant).  Problem Solving Problem Solving: 6-Solves complex problems: With extra time  Memory Memory: 6-More than reasonable amt of time  Medical Problem List and Plan:  1. 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 DVT Prophylaxis/Anticoagulation: Subcutaneous heparin. Monitor platelet counts any signs of bleeding. Check venous Doppler study today 3. Mood: Zoloft 25 mg daily. Provide emotional support and positive reinforcement  4. Neuropsych: This patient is capable of making decisions on her own behalf.  5. Tracheostomy. Decannulated 09/19/2012. Check oxygen saturations every shift. Comfortable at present  6. Acute renal failure-resolved. Followup chemistries  7. Clostridium difficile diarrhea. Vancomycin and Flagyl completed 09/23/2012. Follow  up testing has been negative  8. Hypertension. Lasix 40 mg daily, hydralazine 25 mg 4 times daily. Monitor with increased mobility. Normotensive at present.  LOS (Days) 1 A FACE TO FACE EVALUATION WAS PERFORMED  SWARTZ,ZACHARY T 09/25/2012  8:29 AM

## 2012-09-25 NOTE — Progress Notes (Signed)
Patient information reviewed and entered into eRehab system by Charnel Giles, RN, CRRN, PPS Coordinator.  Information including medical coding and functional independence measure will be reviewed and updated through discharge.    

## 2012-09-25 NOTE — Evaluation (Signed)
Physical Therapy Assessment and Plan  Patient Details  Name: [name] MRN: [id number] Date of Birth: 02-21-1956  PT Diagnosis: Difficulty walking and Muscle weakness Rehab Potential: Good ELOS: 1.5- 2 weeks   Today's Date: 09/25/2012 Time: 0730-0830 Time Calculation (min): 60 min  Problem List:  Patient Active Problem List  Diagnosis  . Acute respiratory failure with hypoxia  . Influenza-like illness  . CAP (community acquired pneumonia)  . AKI (acute kidney injury)  . Hypertension  . Chronic respiratory failure  . Tracheostomy status  . Myopathy    Past Medical History:  Past Medical History  Diagnosis Date  . Hypertension    Past Surgical History:  Past Surgical History  Procedure Laterality Date  . Cyst excision  1980    Spinal cyst  . Tonsillectomy  1980    Assessment & Plan Clinical Impression: 57 year old right-handed white female with history of hypertension admitted to [facility] 08/05/2012 with hypoxemic respiratory failure presumably from H1N1 pneumonia. Patient noted fever, chills and nausea with decreased by mouth intake as well as increasing shortness of breath. She had recently been placed on Tamiflu x5 days but not tested for influenza prior to admission. In the emergency department started on BiPAP. Chest x-ray with diffuse bilateral airspace disease/consolidation right upper lobe without effusion. Placed on broad-spectrum antibiotics. Noted acute renal failure creatinine 2.04 and placed on gentle IV fluids. Renal ultrasound negative for hydronephrosis. She required intubation on 08/08/2011 VDRL suspect septic shock and steroid protocol initiated. Tracheostomy performed for prolonged ventilatory support and de cannulated 09/19/2012 without difficulty followed by critical care medicine. A nasogastric tube remained in place for nutritional support which was later removed and diet advanced to a regular consistency. She was transferred to [facility] for [facility] evaluation 08/11/2012 and not felt to be required but did receive hemodialysis for a short time while at [facility]. [facility]. Creatinine continued to improve with latest creatinine 0.41. 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 Patient transferred to CIR on 09/24/2012 .   Patient currently requires up to total assist with standing activities and mobility secondary to decreased cardiorespiratoy endurance and decreased oxygen support, balance and decreased standing balance and decreased balance strategies.  Prior to hospitalization, patient was independent  with mobility and lived with Spouse;Daughter;Other (Comment) (61 year old grandson, Jean Rosenthal) in a House home. She requires two persons to fully reach standing and is only able to maintain standing position ~30 sec secondary to significant deconditioning and weakness. Pt will benefit from overall strengthening, balance, endurance, and functional mobility training. Pt is motivated, anticipate strong recovery. Pt desaturates to SpO2 88 on 2L [place] O2 with standing activity, returns to 93 with rest. Home access is 4 to 5 stepsStairs to enter.  Patient will benefit from skilled PT intervention to maximize safe functional mobility, minimize fall risk and decrease caregiver burden for planned discharge home with 24 hour supervision.  Anticipate patient will benefit from follow up HH at discharge.  PT - End of Session Activity Tolerance: Tolerates < 10 min activity with changes in vital signs Endurance Deficit: Yes Endurance Deficit Description: Pt requires frequent rest breaks with all activities SpO2 drops to upper 80's low 90's on 2L [place] O2.  PT Assessment Rehab Potential: Good Barriers to Discharge: Other (comment) (steps to enter) PT Plan PT Intensity: Minimum of 1-2 x/day ,45 to 90 minutes PT  Frequency:  5 out of 7 days PT Duration Estimated Length of Stay: 1.5- 2 weeks PT Treatment/Interventions: Ambulation/gait training;Balance/vestibular training;Discharge planning;Community reintegration;DME/adaptive equipment instruction;Functional mobility training;Patient/family education;Neuromuscular re-education;Psychosocial support;Therapeutic Exercise;Therapeutic Activities;Stair training;UE/LE Strength taining/ROM;Wheelchair propulsion/positioning PT Recommendation Follow Up Recommendations: Home health PT (vs outpatient PT pending progress) Patient destination: Home Equipment Recommended: Rolling walker with 5" wheels  Skilled Therapeutic Intervention Educated pt on wheelchair parts management and goals/expectations while on inpatient rehab. Worked on [employer] and positioning and sequencing for scoot transfers. Pt requires increased and extended rest breaks throughout the session.   PT Evaluation Precautions/Restrictions Precautions Precautions: Fall Precaution Comments: Severe deconditioning, rest breaks with all activity, on 2L O2 Restrictions Weight Bearing Restrictions: No General   Vital SignsTherapy Vitals BP: 110/70 mmHg Pain Pain Assessment Pain Assessment: No/denies pain Home Living/Prior Functioning   Home Living/Prior Functioning  Home Living  Lives With: Spouse;Daughter;Family  Available Help at Discharge: Family  Type of Home: House  Home Access: Stairs to enter  Secretary/administrator of Steps: 5  Entrance Stairs-Rails: Right  Home Layout: Two level (full bath on second floor, bedroom on second floor)  Alternate Level Stairs-Number of Steps: 15 steps to second floor  Alternate Level Stairs-Rails: Right (then switches to Lt. half way up)  Bathroom Shower/Tub: Pension scheme manager: Standard  Bathroom Accessibility: Yes  How Accessible: Accessible via walker  Home Adaptive Equipment: None  IADL History  Homemaking Responsibilities: Yes   Meal Prep Responsibility: Primary  Shopping Responsibility: Primary  Current License: Yes  Mode of Transportation: Car  Leisure and Hobbies: likes spending time with family  Prior Function  Level of Independence: Independent with basic ADLs;Independent with homemaking with ambulation;Independent with gait;Independent with transfers  Able to Take Stairs?: Yes  Driving: Yes  Vocation: Other (comment) (homemaker)  Leisure: Hobbies-yes (Comment)  Comments: Taking care of family and grandchild.   Cognition  WFL Sensation Sensation Light Touch: Appears Intact Proprioception: Appears Intact Additional Comments: Pt reports some decreased sensation in very distal aspect of bil. feet Motor  Motor Motor: Within Functional Limits  Mobility Bed Mobility Bed Mobility: Left Sidelying to Sit Left Sidelying to Sit: 4: Min assist (without rail) Left Sidelying to Sit Details (indicate cue type and reason): Verbal and tactile cues for efficiency and ease of transition. Transfers Squat Pivot Transfers: 2: Max Designer, industrial/product Details: Verbal cues for sequencing;Verbal cues for technique;Verbal cues for precautions/safety Squat Pivot Transfer Details (indicate cue type and reason): Cues for sequencing and weight shift.  Locomotion  Ambulation Ambulation: No (Attempted, pt unable secondary to deconditioning. ) Ambulation/Gait Assistance: Not tested (comment) Wheelchair Mobility Wheelchair Mobility: Yes Wheelchair Assistance: 4: Administrator, sports Details: Verbal cues for technique;Verbal cues for Engineer, drilling: Both upper extremities Wheelchair Parts Management: Needs assistance Distance: 75   Balance Balance Balance Assessed: Yes Static Sitting Balance Static Sitting - Balance Support: Bilateral upper extremity supported Static Sitting - Level of Assistance: 5: Stand by assistance Static Sitting - Comment/# of Minutes: Pt with  increased sway, likely secondary to significant deconditioning. Static Standing Balance Static Standing - Balance Support: Bilateral upper extremity supported Static Standing - Level of Assistance: 1: +1 Total assist Static Standing - Comment/# of Minutes: 30 sec Extremity Assessment      RLE Assessment RLE Assessment: Exceptions to [facility] RLE AROM (degrees) RLE Overall AROM Comments: Decreased hip flexion against gravity secondary to weakness.  RLE Strength RLE Overall Strength: Deficits RLE Overall Strength Comments: Hip flexors 2/5, knee extension/flexion 3-/5, dorsiflexion  3+/5 LLE Assessment LLE Assessment: Exceptions to WFL LLE AROM (degrees) LLE Overall AROM Comments: Decreased ROM secondary to weakness LLE Strength LLE Overall Strength Comments: Hip flexors 2/5, knee extension/flexion 3-/5, dorsiflexion 3+/5  FIM:  FIM - Bed/Chair Transfer Bed/Chair Transfer Assistive Devices: Arm rests;Walker Bed/Chair Transfer: 3: Supine > Sit: Mod A (lifting assist/Pt. 50-74%/lift 2 legs;4: Sit > Supine: Min A (steadying pt. > 75%/lift 1 leg);2: Bed > Chair or W/C: Max A (lift and lower assist) FIM - Locomotion: Wheelchair Distance: 75 Locomotion: Wheelchair: 2: Travels 50 - 149 ft with minimal assistance (Pt.>75%) FIM - Locomotion: Ambulation Ambulation/Gait Assistance: Not tested (comment) Locomotion: Ambulation: 0: Activity did not occur (pt too weak and deconditioned) FIM - Locomotion: Stairs Locomotion: Stairs: 0: Activity did not occur (unable to perform secondary to weakness)   Refer to Care Plan for Long Term Goals  Recommendations for other services: None  Discharge Criteria: Patient will be discharged from PT if patient refuses treatment 3 consecutive times without medical reason, if treatment goals not met, if there is a change in medical status, if patient makes no progress towards goals or if patient is discharged from hospital.  The above assessment, treatment plan,  treatment alternatives and goals were discussed and mutually agreed upon: by patient  [name] 09/25/2012, 12:40 PM

## 2012-09-26 ENCOUNTER — Encounter (HOSPITAL_COMMUNITY): Payer: Managed Care, Other (non HMO)

## 2012-09-26 ENCOUNTER — Inpatient Hospital Stay (HOSPITAL_COMMUNITY): Payer: Managed Care, Other (non HMO)

## 2012-09-26 ENCOUNTER — Inpatient Hospital Stay (HOSPITAL_COMMUNITY): Payer: Managed Care, Other (non HMO) | Admitting: Physical Therapy

## 2012-09-26 DIAGNOSIS — J9600 Acute respiratory failure, unspecified whether with hypoxia or hypercapnia: Secondary | ICD-10-CM

## 2012-09-26 DIAGNOSIS — G7281 Critical illness myopathy: Secondary | ICD-10-CM

## 2012-09-26 DIAGNOSIS — I80299 Phlebitis and thrombophlebitis of other deep vessels of unspecified lower extremity: Secondary | ICD-10-CM

## 2012-09-26 NOTE — Progress Notes (Signed)
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 Patient is asymptomatic and can resume therapies 09/26/2012. All issues in regards to DVT were discussed with patient and husband.

## 2012-09-26 NOTE — Progress Notes (Signed)
Physical Therapy Note  Patient Details  Name: [name] MRN: [id number] Date of Birth: 08-03-1956 Today's Date: 09/26/2012  Patient missed 30 minutes of physical therapy due to being on bedrest secondary to DVT's.    Arelia Longest M 09/26/2012, 2:55 PM

## 2012-09-26 NOTE — Progress Notes (Signed)
Social Work  Social Work Assessment and Plan  Patient Details  Name: Rossie Scarfone MRN: [id number] Date of Birth: 1955-11-08  Today's Date: 09/26/2012  Problem List:  Patient Active Problem List  Diagnosis  . Acute respiratory failure with hypoxia  . Influenza-like illness  . CAP (community acquired pneumonia)  . AKI (acute kidney injury)  . Hypertension  . Chronic respiratory failure  . Tracheostomy status  . Myopathy   Past Medical History:  Past Medical History  Diagnosis Date  . Hypertension    Past Surgical History:  Past Surgical History  Procedure Laterality Date  . Cyst excision  1980    Spinal cyst  . Tonsillectomy  1980   Social History:  reports that she has never smoked. She does not have any smokeless tobacco history on file. She reports that she does not drink alcohol or use illicit drugs.  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 son Anticipated Caregiver: spouse and daughter, Alphonzo Lemmings Ability/Limitations of Caregiver: he is on short term disability for b knee issues since pt admitted. Scgeduled to go back to work in March Caregiver Availability: 24/7 Family Dynamics: pt describes entire family as very supportive and attentive.  Notes husband has been to hospital everyday since her admission 08/05/12.  Social History Preferred language: English Religion: Baptist Cultural Background: NA Education: HS Read: Yes Write: Yes Fish farm manager Issues: none Guardian/Conservator: none   Abuse/Neglect Physical Abuse: Denies Verbal Abuse: Denies Sexual Abuse: Denies Exploitation of patient/patient's resources:  Denies Self-Neglect: Denies  Emotional Status Pt's affect, behavior adn adjustment status: Pt very pleasant, talkative and generally optimistic about gains to be made here on CIR.  Becomes slightly tearful when she talks about her functional limitations and notes, "I'm just not used to not being able to get up and go."  Also speaks o fher 56 y.o. Luna Fuse (who she was caregiver for PTA) and how desperately she wants to see him.  BDI screen = 0.  Pt denies any significant s/s of emotional distress.  Recognizes that her recovery will "take some time...just hard" Recent Psychosocial Issues: Pt's father died May 25, 2023 Pyschiatric History: None Substance Abuse History: None  Patient / Family Perceptions, Expectations & Goals Pt/Family understanding of illness & functional limitations: Pt and family with good understanding of her illness and the lengthy course of complications she has been through.  Daughter is a CNA here at [facility] on ICU and husband is in Manufacturing systems engineer with concentration of work with ICU physicians. Premorbid pt/family roles/activities: Pt was providing care to her 23 y.o. grandson while daughter worked. Anticipated changes in roles/activities/participation: Only anticipate changes to be temporary - pt's sons are now providing childcare. Pt/family expectations/goals: Pt very hopeful that she will be able to return to her prior level of activity and resume caring for grandson.  Community Resources [employer]: None Premorbid Home Care/DME Agencies: None Transportation available at discharge: yes  Discharge Planning Living Arrangements: Spouse/significant other;Children (daughter, Maylee Bare) Support Systems: Spouse/significant other;Children;Friends/neighbors Type of Residence: Private residence Civil engineer, contracting: Media planner (specify) Counselling psychologist) Financial Resources: Employment (spouse's employment) Surveyor, quantity Screen Referred: No Living Expenses: Database administrator  Management: Spouse Do you have any problems obtaining your medications?:  No Home Management: patient was doing this PTA Patient/Family Preliminary Plans: Pt plans to return to her home with husband and children providing any necessary assistance. Social Work Anticipated Follow Up Needs: HH/OP Expected length of stay: ELOS 2 to 3 weeks  Clinical Impression Very pleasant, motivated woman here after lengthy hospitalization and deconditioned.  Excellent family support and all anticipate good recovery.  Pt appears to coping well and admits she does get frustrated at times with her overall decreased stamina.  Denies any concerns about family being able to provide any needed assistance at d/c.  Caniya Tagle 09/26/2012, 2:53 PM

## 2012-09-26 NOTE — Progress Notes (Signed)
Inpatient Rehabilitation Center Individual Statement of Services  Patient Name:  Adriene Knipfer  Date:  09/26/2012  Welcome to the Inpatient Rehabilitation Center.  Our goal is to provide you with an individualized program based on your diagnosis and situation, designed to meet your specific needs.  With this comprehensive rehabilitation program, you will be expected to participate in at least 3 hours of rehabilitation therapies Monday-Friday, with modified therapy programming on the weekends.  Your rehabilitation program will include the following services:  Physical Therapy (PT), Occupational Therapy (OT), 24 hour per day rehabilitation nursing, Therapeutic Recreaction (TR), Case Management ( Social Worker), Rehabilitation Medicine, Nutrition Services and Pharmacy Services  Weekly team conferences will be held on Tuesdays to discuss your progress.  Your Social Worker will talk with you frequently to get your input and to update you on team discussions.  Team conferences with you and your family in attendance may also be held.  Expected length of stay: 2-3 weeks  Overall anticipated outcome: supervision to minimal assistance  Depending on your progress and recovery, your program may change.  Your  Social Worker will coordinate services and will keep you informed of any changes.  Your  Social Worker's name and contact numbers are listed  below.  The following services may also be recommended but are not provided by the Inpatient Rehabilitation Center:   Driving Evaluations  Home Health Rehabiltiation Services  Outpatient Rehabilitatation Servives    Arrangements will be made to provide these services after discharge if needed.  Arrangements include referral to agencies that provide these services.  Your insurance has been verified to be:  [place] Your primary doctor is:  Dr. Johny Blamer  Pertinent information will be shared with your doctor and your insurance company.   Social Worker:   [place], [place] [phone] or ([phone]  Information discussed with and copy given to patient by: Amada Jupiter, 09/26/2012, 1:34 PM

## 2012-09-26 NOTE — Progress Notes (Signed)
Occupational Therapy Note  Patient Details  Name: [name] MRN: [id number] Date of Birth: 02/11/1956 Today's Date: 09/26/2012  Pt missed 45 mins skilled OT services secondary to MD ordered bed rest.   Rich Brave 09/26/2012, 11:36 AM

## 2012-09-26 NOTE — Progress Notes (Signed)
Physical Therapy Session Note  Patient Details  Name: [name] MRN: [id number] Date of Birth: 1955/11/15  Today's Date: 09/26/2012 Time 0830  PT  Cancellation Note  Treatment cancelled today due to medical issues with patient which prohibited therapy: pt on bedrest due to bil. LE DVT. Pt missed 30 min skilled PT time.   Sherrine Maples Cheek 09/26/2012, 8:48 AM

## 2012-09-26 NOTE — Progress Notes (Signed)
Subjective/Complaints: No problems last evening. No SOB. Still wearing oxygen. A little anxious regarding DVT. On bedrest  A 12 point review of systems has been performed and if not noted above is otherwise negative.   Objective: Vital Signs: Blood pressure 114/75, pulse 82, temperature 97.5 F (36.4 C), temperature source Oral, resp. rate 17, height 5\' 7"  (1.702 m), weight 99.746 kg (219 lb 14.4 oz), SpO2 94.00%. No results found.  Recent Labs  09/24/12 1630 09/25/12 0645  WBC 6.2 5.4  HGB 11.7* 11.1*  HCT 34.8* 33.3*  PLT 264 233    Recent Labs  09/24/12 1630 09/25/12 0645  NA  --  139  K  --  4.5  CL  --  102  GLUCOSE  --  109*  BUN  --  16  CREATININE 0.77 0.58  CALCIUM  --  9.7   CBG (last 3)  No results found for this basename: GLUCAP,  in the last 72 hours  Wt Readings from Last 3 Encounters:  09/24/12 99.746 kg (219 lb 14.4 oz)  09/23/12 113.399 kg (250 lb)  08/11/12 168.9 kg (372 lb 5.7 oz)    Physical Exam:  Constitutional: She is oriented to person, place, and time. She appears well-developed. obese  HENT:  Head: Normocephalic, oral mucosa pink and moist.  Eyes:  Pupils round and reactive to light  Neck: Normal range of motion. Neck supple. No thyromegaly present.  Cardiovascular: Normal rate and regular rhythm. No murmurs.  Pulmonary/Chest: Effort normal and breath sounds normal. No respiratory distress. She has no wheezes.  Abdominal: Soft. Bowel sounds are normal. She exhibits no distension. There is no tenderness.  Musculoskeletal:  trace edema lower extremities.  NEURO: Decreased sensation to light touch bilaterally at toes. UE 4/5 deltoids, biceps, triceps, WE, HI. LE 2+ HF, 3 KE, 4/5 ADF and APF. DTR'S are 1+.  She is alert and oriented to person, place, and time.  Follows full commands. Has excellent insight and awareness.  Skin: Skin is warm and dry.  Tracheostomy site well healed  Psychiatric: She has a normal mood and affect.     Assessment/Plan: 1. 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 Physiatrist is providing close team supervision and 24 hour management of active medical problems listed below. Physiatrist and rehab team continue to assess barriers to discharge/monitor patient progress toward functional and medical goals.    FIM: FIM - Bathing Bathing Steps Patient Completed: Chest;Right Arm;Left Arm;Abdomen;Left upper leg;Right lower leg (including foot) Bathing: 3: Mod-Patient completes 5-7 62f 10 parts or 50-74%  FIM - Upper Body Dressing/Undressing Upper body dressing/undressing steps patient completed: Thread/unthread right bra strap;Thread/unthread left bra strap;Thread/unthread right sleeve of pullover shirt/dresss;Thread/unthread left sleeve of pullover shirt/dress;Put head through opening of pull over shirt/dress Upper body dressing/undressing: 4: Min-Patient completed 75 plus % of tasks FIM - Lower Body Dressing/Undressing Lower body dressing/undressing: 1: Two helpers  FIM - Toileting Toileting: 0: Activity did not occur     FIM - Banker Devices: Arm rests;Walker Bed/Chair Transfer: 3: Supine > Sit: Mod A (lifting assist/Pt. 50-74%/lift 2 legs;4: Sit > Supine: Min A (steadying pt. > 75%/lift 1 leg);2: Bed > Chair or W/C: Max A (lift and lower assist)  FIM - Locomotion: Wheelchair Distance: 75 Locomotion: Wheelchair: 2: Travels 50 - 149 ft with minimal assistance (Pt.>75%) FIM - Locomotion: Ambulation Ambulation/Gait Assistance: Not  tested (comment) Locomotion: Ambulation: 0: Activity did not occur (pt too weak and deconditioned)  Comprehension Comprehension Mode: Auditory Comprehension: 7-Follows complex conversation/direction: With no assist  Expression Expression Mode:  Verbal Expression: 7-Expresses complex ideas: With no assist  Social Interaction Social Interaction: 6-Interacts appropriately with others with medication or extra time (anti-anxiety, antidepressant).  Problem Solving Problem Solving: 5-Solves complex 90% of the time/cues < 10% of the time  Memory Memory: 7-Complete Independence: No helper  Medical Problem List and Plan:  1. 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 DVT Prophylaxis/Anticoagulation:   Venous dopplers show extensive bilateral femoral, popliteal DVT's. Xarelto initiated. Will continue bedrest through today and resume full activity tomorrow. Pt may participate in bed activities only.   3. Mood: Zoloft 25 mg daily. Provide emotional support and positive reinforcement  4. Neuropsych: This patient is capable of making decisions on her own behalf.  5. Tracheostomy. Decannulated 09/19/2012. Check oxygen saturations every shift. Comfortable at present  6. Acute renal failure-resolved.  BUN and CR wnl 7. Clostridium difficile diarrhea. Vancomycin and Flagyl completed 09/23/2012. Follow up testing has been negative. Stool formed 8. Hypertension. Lasix 40 mg daily, hydralazine 25 mg 4 times daily. Monitor with increased mobility. Normotensive at present.  LOS (Days) 2 A FACE TO FACE EVALUATION WAS PERFORMED  SWARTZ,ZACHARY T 09/26/2012 7:13 AM

## 2012-09-26 NOTE — Progress Notes (Signed)
Physical Therapy Session Note  Patient Details  Name: [name] MRN: [id number] Date of Birth: [birth year]  Today's Date: 09/26/2012  PT Cancellation Note  Pt continues to be on bedrest, 30 min skilled PT services missed.   Sherrine Maples Cheek 09/26/2012, 1:36 PM

## 2012-09-26 NOTE — Progress Notes (Signed)
Occupational Therapy Note  Patient Details  Name: [name] MRN: [id number] Date of Birth: Nov 27, 1955 Today's Date: 09/26/2012  Pt missed 60 mins skilled OT services secondary to MD order for bedrest.   Rich Brave 09/26/2012, 7:53 AM

## 2012-09-27 ENCOUNTER — Inpatient Hospital Stay (HOSPITAL_COMMUNITY): Payer: Managed Care, Other (non HMO) | Admitting: Physical Therapy

## 2012-09-27 ENCOUNTER — Inpatient Hospital Stay (HOSPITAL_COMMUNITY): Payer: Managed Care, Other (non HMO) | Admitting: Occupational Therapy

## 2012-09-27 NOTE — Progress Notes (Signed)
Occupational Therapy Session Note  Patient Details  Name: [name] MRN: [id number] Date of Birth: January 08, 1956  Today's Date: 09/27/2012 Time: 7846-9629 and 5284-1324 Time Calculation (min): 60 min and 58 min  Short Term Goals: Week 1:  OT Short Term Goal 1 (Week 1): Patient will transfer to commode with mod assist OT Short Term Goal 2 (Week 1): Patient will complete lower body dressing with mod assist OT Short Term Goal 3 (Week 1): Patient will complete bathing with mod assist OT Short Term Goal 4 (Week 1): Patient will complete grooming with set up assist OT Short Term Goal 5 (Week 1): Patient will transfer to shower bench/seat with mod assist  Skilled Therapeutic Interventions/Progress Updates:    Visit 1: no c/o pain this session. Pt seen for BADL retraining of B/D at shower level with a focus on squat pivot transfers, sit to stand, and activity tolerance.  Pt was able to sit to EOB with head elevated and use of side rail with min to mod assist. Worked on squat pivots versus slide board with mod assist into w/c, mod assist to tub bench, but then max to total tub bench to w/c due to fatigue. She was able to sit on tub bench and reach both arms overhead to wash hair. Overall did well with UB activities, but needs total assist with LB activities.  Stood 1x at sink with max assist x2 (pt's husband assisted), but was not able to extend trunk upright.  Pt left in room with husband to complete her grooming tasks.  Visit 2: Pt seen this session to work on sit to stand skills using the stedy and general AROM for overall activity tolerance.  Pt's bed was elevated slighty and feet positioned in stedy. Pt did extremely well moving into a stand with min assist by pulling up on stedy bar.  She was only able to tolerate standing for 30 seconds or less, but was able to repeat the action 8x. In standing, she was able to weight shift from foot to foot. After pt became too fatigued to continue, she sat on EOB and  worked on overhead AROM, trunk strengthening and weight shifting.  In supine, mini bridges as pt is not able to fully bridge and knee sways.  Pt tolerated therapy fairly well maintaining 95-97% O2 saturation on 2L.  Therapy Documentation Precautions:  Precautions Precautions: Fall (O2 via nasal cannula) Precaution Comments: 02 Restrictions Weight Bearing Restrictions: No    Vital Signs: Therapy Vitals Pulse Rate: 84 Resp: 18 BP: 128/62 mmHg Patient Position, if appropriate: Lying Oxygen Therapy SpO2: 98 % O2 Device: Nasal cannula O2 Flow Rate (L/min): 2 L/min    ADL:  See FIM for current functional status  Therapy/Group: Individual Therapy  SAGUIER,JULIA 09/27/2012, 11:40 AM

## 2012-09-27 NOTE — Progress Notes (Signed)
Subjective/Complaints: Felt "listless" yesterday. Ready to get out of bed. Needed oxygen to put back on when sats dropped. Felt a little sob as well.  A 12 point review of systems has been performed and if not noted above is otherwise negative.   Objective: Vital Signs: Blood pressure 107/59, pulse 72, temperature 98 F (36.7 C), temperature source Oral, resp. rate 18, height 5\' 7"  (1.702 m), weight 99.746 kg (219 lb 14.4 oz), SpO2 100.00%. No results found.  Recent Labs  09/24/12 1630 09/25/12 0645  WBC 6.2 5.4  HGB 11.7* 11.1*  HCT 34.8* 33.3*  PLT 264 233    Recent Labs  09/24/12 1630 09/25/12 0645  NA  --  139  K  --  4.5  CL  --  102  GLUCOSE  --  109*  BUN  --  16  CREATININE 0.77 0.58  CALCIUM  --  9.7   CBG (last 3)  No results found for this basename: GLUCAP,  in the last 72 hours  Wt Readings from Last 3 Encounters:  09/24/12 99.746 kg (219 lb 14.4 oz)  09/23/12 113.399 kg (250 lb)  08/11/12 168.9 kg (372 lb 5.7 oz)    Physical Exam:  Constitutional: She is oriented to person, place, and time. She appears well-developed. obese  HENT:  Head: Normocephalic, oral mucosa pink and moist.  Eyes:  Pupils round and reactive to light  Neck: Normal range of motion. Neck supple. No thyromegaly present.  Cardiovascular: Normal rate and regular rhythm. No murmurs.  Pulmonary/Chest: Effort normal and breath sounds normal. No respiratory distress. She has no wheezes.  Abdominal: Soft. Bowel sounds are normal. She exhibits no distension. There is no tenderness.  Musculoskeletal:  trace edema lower extremities.  NEURO: Decreased sensation to light touch bilaterally at toes. UE 4/5 deltoids, biceps, triceps, WE, HI. LE 2+ HF, 3 KE, 4/5 ADF and APF. DTR'S are 1+.  She is alert and oriented to person, place, and time.  Follows full commands. Has excellent insight and awareness.  Skin: Skin is warm and dry.  Tracheostomy site well healed  Psychiatric: She has a normal  mood and affect.    Assessment/Plan: 1. 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 Physiatrist is providing close team supervision and 24 hour management of active medical problems listed below. Physiatrist and rehab team continue to assess barriers to discharge/monitor patient progress toward functional and medical goals.    FIM: FIM - Bathing Bathing Steps Patient Completed: Chest;Right Arm;Left Arm;Abdomen;Left upper leg;Right lower leg (including foot) Bathing: 3: Mod-Patient completes 5-7 59f 10 parts or 50-74%  FIM - Upper Body Dressing/Undressing Upper body dressing/undressing steps patient completed: Thread/unthread right bra strap;Thread/unthread left bra strap;Thread/unthread right sleeve of pullover shirt/dresss;Thread/unthread left sleeve of pullover shirt/dress;Put head through opening of pull over shirt/dress Upper body dressing/undressing: 4: Min-Patient completed 75 plus % of tasks FIM - Lower Body Dressing/Undressing Lower body dressing/undressing: 1: Two helpers  FIM - Toileting Toileting: 0: Activity did not occur     FIM - Banker Devices: Arm rests;Walker Bed/Chair Transfer: 3: Supine > Sit: Mod A (lifting assist/Pt. 50-74%/lift 2 legs;4: Sit > Supine: Min A (steadying pt. > 75%/lift 1 leg);2: Bed > Chair or W/C: Max A (lift and lower assist)  FIM - Locomotion: Wheelchair Distance: 75 Locomotion: Wheelchair: 2: Travels 50 - 149 ft with minimal assistance (  Pt.>75%) FIM - Locomotion: Ambulation Ambulation/Gait Assistance: Not tested (comment) Locomotion: Ambulation: 0: Activity did not occur (pt too weak and deconditioned)  Comprehension Comprehension Mode: Auditory Comprehension: 7-Follows complex conversation/direction: With no  assist  Expression Expression Mode: Verbal Expression: 7-Expresses complex ideas: With no assist  Social Interaction Social Interaction: 7-Interacts appropriately with others - No medications needed.  Problem Solving Problem Solving: 7-Solves complex problems: Recognizes & self-corrects  Memory Memory: 7-Complete Independence: No helper  Medical Problem List and Plan:  1. 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 DVT Prophylaxis/Anticoagulation:   Venous dopplers show extensive bilateral femoral, popliteal DVT's. Xarelto initiated.RESUME FULL ACTIVITIES TODAY   3. Mood: Zoloft 25 mg daily. Provide emotional support and positive reinforcement  4. Neuropsych: This patient is capable of making decisions on her own behalf.  5. Pulmonary. Continue oxygen as needed.  It is possible that she may have flipped off a small clot to her lungs  6. Acute renal failure-resolved.  BUN and CR wnl 7. Clostridium difficile diarrhea. Vancomycin and Flagyl completed 09/23/2012. Follow up testing has been negative. Stool formed 8. Hypertension. Lasix 40 mg daily, hydralazine 25 mg 4 times daily. Monitor with increased mobility. Normotensive at present.  LOS (Days) 3 A FACE TO FACE EVALUATION WAS PERFORMED  SWARTZ,ZACHARY T 09/27/2012 8:02 AM

## 2012-09-27 NOTE — Progress Notes (Signed)
Physical Therapy Note  Patient Details  Name: Tharon Kitch MRN: [id number] Date of Birth: 01/30/1956 Today's Date: 09/27/2012  1105-1135 (30 minutes ) individual Pain: no reported pain Other: Oxygen sats 94% on 2 L [place] , pulse 74 Focus of treatment: transfer training; bed mobility training; bilateral LE strengthening Treatment: Transfer scoot (level surfaces) wc >< mat SBA with vcs for safe positioning; sit to supine (mat) SBA with difficulty bilateral LEs; supine to side to sit min assist with tactile cues for hand placement; bilateral LE strengthening X 10- quad sets , ankle pumps, heel slides, bridging ,SAQs (X 20) , hip abduction. Oxygen sats > 92% during treatment session on 2 L [place].     Kelsi Benham,JIM 09/27/2012, 11:40 AM

## 2012-09-27 NOTE — Progress Notes (Signed)
Physical Therapy Session Note  Patient Details  Name: [name] MRN: [id number] Date of Birth: 03-09-1956  Today's Date: 09/27/2012 Time: 4098-1191 Time Calculation (min): 55 min  Short Term Goals: Week 1:  PT Short Term Goal 1 (Week 1): Pt will tolerate 3 min continuous standing activity with min assist PT Short Term Goal 2 (Week 1): Pt will propel wheelchair x 200' continuous with supervision PT Short Term Goal 3 (Week 1): Pt will perform bed > chair transfer squat pivot with supervision PT Short Term Goal 4 (Week 1): Pt will perform bed > chair transfer stand pivot with mod assist  Skilled Therapeutic Interventions/Progress Updates:    Wheelchair mobility x 120', 60' with supervision performed for improved endurance, multiple rest breaks needed.Standing frame for standing tolerance, worked on moving into slight knee/hip flexion then back to full stand. Pt heavily reliant on Bil. UEs, fatigues very, very fast. Attempted balance activity and pt only momentarily able to lift one hand to draw on mirror. Notes tightness of musculature in calves and knees. 3 bouts of standing performed.   Wheelchair push ups 2 x 10 reps with 5 sec holds, pt unable to clear buttocks at this time. Pt to try to perform every 30-15min when up in chair.   Educated pt and husband on heel stretch when seated, knee extension at rest in bed (pillow under heels).   SpO2 >90% on 2L [place] O2, HR 85-96 bpm throughout session. Unable to wean off of O2 this session  Therapy Documentation Precautions:  Precautions Precautions: Fall (O2 via nasal cannula) Precaution Comments: 02 Restrictions Weight Bearing Restrictions: No Pain: Pain Assessment Pain Assessment: No/denies pain   See FIM for current functional status  Therapy/Group: Individual Therapy  [name] 09/27/2012, 12:23 PM

## 2012-09-28 ENCOUNTER — Inpatient Hospital Stay (HOSPITAL_COMMUNITY): Payer: Managed Care, Other (non HMO) | Admitting: *Deleted

## 2012-09-28 NOTE — Progress Notes (Signed)
Physical Therapy Session Note  Patient Details  Name: [name] MRN: [id number] Date of Birth: [birth year]  Today's Date: 09/28/2012 Time: 5366-4403 Time Calculation (min): 45 min  Bed mobility training rolling R and L with SBA,supine to sit with CGA and cues for use of bed rails, sitting EOB with no assistance, patient requires rest after coming from supine position. Transfer to w/c-scooting with modA.In sitting heel cord stretching and exercises to reduce contractures. W/c mobility training to increase patient endurance and ability to safely perform mobility at current level of functioning.(2x 150 feet with one rest break each time) Sit to stand in II bars 3x with modA, patient able to maintain standing for up to 30 seconds with use of B UE.Training focused on selective hip extension to facilitate proper standing posture. Patient maintained her O2 over 90 % at all times.   Precautions Precautions: Fall (O2 via nasal cannula) Precaution Comments: 02 Restrictions Weight Bearing Restrictions: No Pain Assessment Pain Assessment: 0-10 Pain Score:   2  See FIM for current functional status  Therapy/Group: Individual Therapy  Dorna Mai 09/28/2012, 11:56 AM

## 2012-09-28 NOTE — Progress Notes (Signed)
Subjective/Complaints: Tired from therapies yesterday. Required oxygen to maintain sats. Was happy to get up! A 12 point review of systems has been performed and if not noted above is otherwise negative.   Objective: Vital Signs: Blood pressure 97/59, pulse 77, temperature 98.2 F (36.8 C), temperature source Oral, resp. rate 18, height 5\' 7"  (1.702 m), weight 99.746 kg (219 lb 14.4 oz), SpO2 95.00%. No results found. No results found for this basename: WBC, HGB, HCT, PLT,  in the last 72 hours No results found for this basename: NA, K, CL, CO, GLUCOSE, BUN, CREATININE, CALCIUM,  in the last 72 hours CBG (last 3)  No results found for this basename: GLUCAP,  in the last 72 hours  Wt Readings from Last 3 Encounters:  09/24/12 99.746 kg (219 lb 14.4 oz)  09/23/12 113.399 kg (250 lb)  08/11/12 168.9 kg (372 lb 5.7 oz)    Physical Exam:  Constitutional: She is oriented to person, place, and time. She appears well-developed. obese  HENT:  Head: Normocephalic, oral mucosa pink and moist.  Eyes:  Pupils round and reactive to light  Neck: Normal range of motion. Neck supple. No thyromegaly present.  Cardiovascular: Normal rate and regular rhythm. No murmurs.  Pulmonary/Chest: Effort normal and breath sounds normal. No respiratory distress. She has no wheezes.  Abdominal: Soft. Bowel sounds are normal. She exhibits no distension. There is no tenderness.  Musculoskeletal:  trace edema lower extremities.  NEURO: Decreased sensation to light touch bilaterally at toes. UE 4/5 deltoids, biceps, triceps, WE, HI. LE 2+ HF, 3 KE, 4/5 ADF and APF. DTR'S are 1+.  She is alert and oriented to person, place, and time.  Follows full commands. Has excellent insight and awareness.  Skin: Skin is warm and dry.  Tracheostomy site well healed  Psychiatric: She has a normal mood and affect.    Assessment/Plan: 1. 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 Physiatrist is providing close team supervision and 24 hour management of active medical problems listed below. Physiatrist and rehab team continue to assess barriers to discharge/monitor patient progress toward functional and medical goals.    FIM: FIM - Bathing Bathing Steps Patient Completed: Chest;Right Arm;Left Arm;Abdomen;Front perineal area;Right upper leg;Left upper leg Bathing: 3: Mod-Patient completes 5-7 41f 10 parts or 50-74%  FIM - Upper Body Dressing/Undressing Upper body dressing/undressing steps patient completed: Thread/unthread right bra strap;Thread/unthread left bra strap;Thread/unthread right sleeve of pullover shirt/dresss;Thread/unthread left sleeve of pullover shirt/dress;Put head through opening of pull over shirt/dress Upper body dressing/undressing: 4: Min-Patient completed 75 plus % of tasks FIM - Lower Body Dressing/Undressing Lower body dressing/undressing: 1: Two helpers  FIM - Hotel manager Devices: Grab bar or rail for support Toileting: 1: Total-Patient completed zero steps, helper did all 3  FIM - Archivist Transfers: 1-Two helpers  FIM - Banker Devices: HOB elevated;Bed rails Bed/Chair Transfer: 1: Two helpers  FIM - Locomotion: Wheelchair Distance: 120 Locomotion: Wheelchair: 2: Travels 50 - 149 ft with supervision, cueing or coaxing FIM - Locomotion: Ambulation Locomotion: Ambulation Assistive Devices:  (unsafe to perform secondary to weakness) Ambulation/Gait Assistance: Not tested (comment) Locomotion: Ambulation: 0: Activity did not occur (pt too weak and deconditioned)  Comprehension Comprehension Mode: Auditory Comprehension: 7-Follows complex conversation/direction: With no assist  Expression Expression Mode: Verbal Expression: 7-Expresses complex  ideas: With no assist  Social  Interaction Social Interaction: 6-Interacts appropriately with others with medication or extra time (anti-anxiety, antidepressant).  Problem Solving Problem Solving: 6-Solves complex problems: With extra time  Memory Memory: 7-Complete Independence: No helper  Medical Problem List and Plan:  1. 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 DVT Prophylaxis/Anticoagulation:   Venous dopplers show extensive bilateral femoral, popliteal DVT's. Xarelto initiated. All activities resumed yesterday without issue   3. Mood: Zoloft 25 mg daily. Provide emotional support and positive reinforcement  4. Neuropsych: This patient is capable of making decisions on her own behalf.  5. Pulmonary. Continue oxygen as needed at 1-2 L.  It is possible that she may have flipped off a small clot to her lungs   - I would expect that she can wean from oxygen before dc 6. Acute renal failure-resolved.  BUN and CR wnl 7. Clostridium difficile diarrhea. Vancomycin and Flagyl completed 09/23/2012. Follow up testing has been negative. Stool formed 8. Hypertension. Lasix 40 mg daily, hydralazine 25 mg 4 times daily. Monitor with increased mobility. Normotensive at present.  LOS (Days) 4 A FACE TO FACE EVALUATION WAS PERFORMED  Deshae Dickison T 09/28/2012 7:43 AM

## 2012-09-29 ENCOUNTER — Encounter (HOSPITAL_COMMUNITY): Payer: Managed Care, Other (non HMO)

## 2012-09-29 ENCOUNTER — Inpatient Hospital Stay (HOSPITAL_COMMUNITY): Payer: Managed Care, Other (non HMO) | Admitting: Physical Therapy

## 2012-09-29 ENCOUNTER — Inpatient Hospital Stay (HOSPITAL_COMMUNITY): Payer: Managed Care, Other (non HMO) | Admitting: *Deleted

## 2012-09-29 DIAGNOSIS — G7281 Critical illness myopathy: Secondary | ICD-10-CM

## 2012-09-29 DIAGNOSIS — J9600 Acute respiratory failure, unspecified whether with hypoxia or hypercapnia: Secondary | ICD-10-CM

## 2012-09-29 DIAGNOSIS — I80299 Phlebitis and thrombophlebitis of other deep vessels of unspecified lower extremity: Secondary | ICD-10-CM

## 2012-09-29 DIAGNOSIS — G729 Myopathy, unspecified: Secondary | ICD-10-CM

## 2012-09-29 NOTE — Progress Notes (Signed)
Physical Therapy Session Note  Patient Details  Name: [name] MRN: [id number] Date of Birth: July 29, 1956  Today's Date: 09/29/2012 Time: 1130-1200 Time Calculation (min): 30 min  Short Term Goals: Week 1:  PT Short Term Goal 1 (Week 1): Pt will tolerate 3 min continuous standing activity with min assist PT Short Term Goal 2 (Week 1): Pt will propel wheelchair x 200' continuous with supervision PT Short Term Goal 3 (Week 1): Pt will perform bed > chair transfer squat pivot with supervision PT Short Term Goal 4 (Week 1): Pt will perform bed > chair transfer stand pivot with mod assist  Skilled Therapeutic Interventions/Progress Updates:    Patient received supine in bed. Patient's daughter present for session. Patient with requests to use restroom. Patient performs scooting transfer bed>wheelchair with mod assist. Patient performs chair>toilet squat pivot transfer with mod assist and use of grab bar. Patient requires 2 person assist (provided by daughter who is a tech here at the hospital) for clothing management while standing with grab bar.  Patient performed wheelchair mobility x150' with supervision and no rest breaks. Patient performed B LE there ex: hip flexion, knee extension with 3" hold, hip adduction with 3" ball squeeze, heel raises, toe raises x30 each.  Patient's SpO2 remained above 93% on 1 L O2 via [place]. Patient returned to room and left seated in wheelchair with all needs within reach and daughter present.  Therapy Documentation Precautions:  Precautions Precautions: Fall Precaution Comments: 02 Restrictions Weight Bearing Restrictions: No Pain: Pain Assessment Pain Assessment: No/denies pain Pain Score: 0-No pain Locomotion : Ambulation Ambulation/Gait Assistance: Not tested (comment) Wheelchair Mobility Distance: 150   See FIM for current functional status  Therapy/Group: Individual Therapy  Chipper Herb. Armstead Heiland, PT, DPT  09/29/2012, 1:24 PM

## 2012-09-29 NOTE — Progress Notes (Signed)
Physical Therapy Session Note  Patient Details  Name: [name] MRN: [id number] Date of Birth: 1955-11-15  Today's Date: 09/29/2012 Time: 4098-1191 Time Calculation (min): 59 min  Short Term Goals: Week 1:  PT Short Term Goal 1 (Week 1): Pt will tolerate 3 min continuous standing activity with min assist PT Short Term Goal 2 (Week 1): Pt will propel wheelchair x 200' continuous with supervision PT Short Term Goal 3 (Week 1): Pt will perform bed > chair transfer squat pivot with supervision PT Short Term Goal 4 (Week 1): Pt will perform bed > chair transfer stand pivot with mod assist  Skilled Therapeutic Interventions/Progress Updates:    Wheelchair mobility x 150', 120' for strengthening and conditioning with supervision multiple rest breaks needed secondary to decreased endurance. Standing in parallel bars performed with max assist to raise and lower. Static standing activity progressed to lateral weight shifting, reaching activities to decrease UE reliance, and pt eventually able to perform two bouts of walking (~4-7 steps at a time forwards then backwards) with min assist (pt heavily reliant on UEs in parallel bars).PT to facilitate anterior trunk lean with sit <>stand transition. Pt with delayed extension of trunk into full standing. All of the proceeding performed over multiple bouts in standing.  Seated long arc quads with 4# ankle weight.   SpO2 >90% on 1L O2 for half of session then began to drop to 86% with activity, pt given 2L [place] 02 for further activities and was able to stay above 89%. HR up to 122 with activity. Pt recovers well with seated rest and cues for controlled breathing.   Therapy Documentation Precautions:  Precautions Precautions: Fall (O2 via nasal cannula) Precaution Comments: 02 Restrictions Weight Bearing Restrictions: No Pain: Pain Assessment Not rated soreness of bil. LEs, declined medication   See FIM for current functional status  Therapy/Group:  Individual Therapy  [name] 09/29/2012, 12:20 PM

## 2012-09-29 NOTE — Progress Notes (Signed)
Subjective/Complaints: Had a good day yesterday. Tolerated things well. A 12 point review of systems has been performed and if not noted above is otherwise negative.   Objective: Vital Signs: Blood pressure 121/76, pulse 84, temperature 98.1 F (36.7 C), temperature source Oral, resp. rate 18, height 5\' 7"  (1.702 m), weight 99.746 kg (219 lb 14.4 oz), SpO2 94.00%. No results found. No results found for this basename: WBC, HGB, HCT, PLT,  in the last 72 hours No results found for this basename: NA, K, CL, CO, GLUCOSE, BUN, CREATININE, CALCIUM,  in the last 72 hours CBG (last 3)  No results found for this basename: GLUCAP,  in the last 72 hours  Wt Readings from Last 3 Encounters:  09/24/12 99.746 kg (219 lb 14.4 oz)  09/23/12 113.399 kg (250 lb)  08/11/12 168.9 kg (372 lb 5.7 oz)    Physical Exam:  Constitutional: She is oriented to person, place, and time. She appears well-developed. obese  HENT:  Head: Normocephalic, oral mucosa pink and moist.  Eyes:  Pupils round and reactive to light  Neck: Normal range of motion. Neck supple. No thyromegaly present.  Cardiovascular: Normal rate and regular rhythm. No murmurs.  Pulmonary/Chest: Effort normal and breath sounds normal. No respiratory distress. She has no wheezes.  Abdominal: Soft. Bowel sounds are normal. She exhibits no distension. There is no tenderness.  Musculoskeletal:  trace edema lower extremities.  NEURO: Decreased sensation to light touch bilaterally at toes. UE 4/5 deltoids, biceps, triceps, WE, HI. LE 2+ HF, 3 KE, 4/5 ADF and APF. DTR'S are 1+.  She is alert and oriented to person, place, and time.  Follows full commands. Has excellent insight and awareness.  Skin: Skin is warm and dry.  Tracheostomy site well healed  Psychiatric: She has a normal mood and affect.    Assessment/Plan: 1. 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 Physiatrist is providing close team supervision and 24 hour management of active medical problems listed below. Physiatrist and rehab team continue to assess barriers to discharge/monitor patient progress toward functional and medical goals.    FIM: FIM - Bathing Bathing Steps Patient Completed: Chest;Right Arm;Left Arm;Abdomen;Front perineal area;Right upper leg;Left upper leg Bathing: 3: Mod-Patient completes 5-7 45f 10 parts or 50-74%  FIM - Upper Body Dressing/Undressing Upper body dressing/undressing steps patient completed: Thread/unthread right bra strap;Thread/unthread left bra strap;Thread/unthread right sleeve of pullover shirt/dresss;Thread/unthread left sleeve of pullover shirt/dress;Put head through opening of pull over shirt/dress Upper body dressing/undressing: 4: Min-Patient completed 75 plus % of tasks FIM - Lower Body Dressing/Undressing Lower body dressing/undressing: 1: Two helpers  FIM - Hotel manager Devices: Grab bar or rail for support Toileting: 1: Total-Patient completed zero steps, helper did all 3  FIM - Archivist Transfers: 1-Two helpers  FIM - Banker Devices: Arm rests Bed/Chair Transfer: 4: Supine > Sit: Min A (steadying Pt. > 75%/lift 1 leg);3: Bed > Chair or W/C: Mod A (lift or lower assist)  FIM - Locomotion: Wheelchair Distance: 120 Locomotion: Wheelchair: 2: Travels 50 - 149 ft with supervision, cueing or coaxing FIM - Locomotion: Ambulation Locomotion: Ambulation Assistive Devices:  (unsafe to perform secondary to weakness) Ambulation/Gait Assistance: Not tested (comment) Locomotion: Ambulation: 0: Activity did not occur  Comprehension Comprehension Mode: Auditory Comprehension: 7-Follows complex conversation/direction: With no assist  Expression Expression Mode: Verbal Expression:  7-Expresses complex ideas: With no assist  Social Interaction Social Interaction: 6-Interacts appropriately with others with medication or extra time (anti-anxiety, antidepressant).  Problem Solving Problem Solving: 6-Solves complex problems: With extra time  Memory Memory: 7-Complete Independence: No helper  Medical Problem List and Plan:  1. 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 DVT Prophylaxis/Anticoagulation:   Venous dopplers show extensive bilateral femoral, popliteal DVT's. Xarelto initiated. All activities resumed yesterday without issue   3. Mood: Zoloft 25 mg daily. Provide emotional support and positive reinforcement  4. Neuropsych: This patient is capable of making decisions on her own behalf.  5. Pulmonary. Continue oxygen as needed at 1-2 L.  -It is possible that she may have flipped off a small clot to her lungs    - continue weaning trials 6. Acute renal failure-resolved.  BUN and CR wnl 7. Clostridium difficile diarrhea. Vancomycin and Flagyl completed 09/23/2012. Follow up testing has been negative. Stool formed 8. Hypertension. Lasix 40 mg daily, hydralazine 25 mg 4 times daily. Monitor with increased mobility. Normotensive at present.  LOS (Days) 5 A FACE TO FACE EVALUATION WAS PERFORMED  Jayanth Szczesniak T 09/29/2012 8:08 AM

## 2012-09-29 NOTE — Progress Notes (Signed)
Occupational Therapy Note  Patient Details  Name: [name] MRN: [id number] Date of Birth: 05-12-1956 Today's Date: 09/29/2012  Time:7:30-8:30am ( .)  Pt seen for 1:1 OT session with COTA to complete showering and dressing this morning. Pt eating breakfast in bed upon arrival. Transfers throughout session max A +2 for safety. Pt requires increased time for standing/transferring. Once in shower, pt able to bathe and wash hair with close S and max A for back and feet only. O2 sats monitored throughout session with pt ranging from 91-97% with 2L O2 donned. O2 only taken off during short shower, with O2 at 94% after shower. Pt recovers quickly when O2 drops into low 90's. Intermittent verbal cues for pursed lip breathing. Pt sat at sink to complete grooming and dressing. Pt stood with max A +2 for LB clothing management. Husband present for session and very supportive and helpful. Pt did not report any pain, just weakness.  Zeph Riebel Hessie Diener 09/29/2012, 12:53 PM

## 2012-09-29 NOTE — Progress Notes (Addendum)
Physical Therapy Session Note  Patient Details  Name: [name] MRN: [id number] Date of Birth: [birth year]  Today's Date: 09/29/2012 Time: 8295-6213 Time Calculation (min): 43 min  Short Term Goals: Week 1:  PT Short Term Goal 1 (Week 1): Pt will tolerate 3 min continuous standing activity with min assist PT Short Term Goal 2 (Week 1): Pt will propel wheelchair x 200' continuous with supervision PT Short Term Goal 3 (Week 1): Pt will perform bed > chair transfer squat pivot with supervision PT Short Term Goal 4 (Week 1): Pt will perform bed > chair transfer stand pivot with mod assist  Skilled Therapeutic Interventions/Progress Updates:    Sit <> stands from elevated mat to RW, max assist to min assist with practice, max assist again with fatigue. Standing tolerance activities for endurance. Wheelchair propulsion for endurance x 200' with 3 rest breaks. Scoot pivot transfer wheelchair to bed with mod assist, assist only needed for transition over wheel and slightly elevated bed. Cues for weight shift and sequencing, wheelchair set up. Sit > supine with min assist for Lt. LE. Pt fatigued from previous sessions. Encouraged pt to get out of bed to wheelchair for supper later this evening with nursing to continue to work on out of bed endurance. Short term goals updated due to pt's progress.  SpO2 down to 86% on 1L [place] 02 after a few standing reps, for rest of reps pt placed on 2L and SpO2 >93%. Pt bumped back down to 1L for wheelchair propulsion and transfers during rest of session. HR 80s-upper 90s this session.  Therapy Documentation Precautions:  Precautions Precautions: Fall Precaution Comments: 02 Restrictions Weight Bearing Restrictions: No Pain: Pain Assessment Pain Assessment: No/denies pain Pain Score: 0-No pain   See FIM for current functional status  Therapy/Group: Individual Therapy  [name] 09/29/2012, 3:42 PM

## 2012-09-30 ENCOUNTER — Inpatient Hospital Stay (HOSPITAL_COMMUNITY): Payer: Managed Care, Other (non HMO) | Admitting: Physical Therapy

## 2012-09-30 ENCOUNTER — Inpatient Hospital Stay (HOSPITAL_COMMUNITY): Payer: Managed Care, Other (non HMO) | Admitting: Occupational Therapy

## 2012-09-30 ENCOUNTER — Inpatient Hospital Stay (HOSPITAL_COMMUNITY): Payer: Managed Care, Other (non HMO)

## 2012-09-30 NOTE — Patient Care Conference (Signed)
Inpatient RehabilitationTeam Conference and Plan of Care Update Date: 09/30/2012   Time: 2:25 PM    Patient Name: [name]      Medical Record Number: [id number]  Date of Birth: 05/24/1956 Sex: Female         Room/Bed: 4031/4031-01 Payor Info: Payor: [employer]  Plan: [employer] MANAGED  Product Type: *No Product type*     Admitting Diagnosis: critical illness mypoathy h/o  Admit Date/Time:  09/24/2012  2:33 PM Admission Comments: No comment available   Primary Diagnosis:  Myopathy Principal Problem: Myopathy  Patient Active Problem List   Diagnosis Date Noted  . Myopathy 09/25/2012  . Chronic respiratory failure 09/01/2012  . 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 Acute respiratory failure with hypoxia 08/06/2012  . Influenza-like illness 08/06/2012  . CAP (community acquired pneumonia) 08/06/2012  . AKI (acute kidney injury) 08/06/2012  . Hypertension 08/06/2012    Expected Discharge Date: Expected Discharge Date: 10/15/12  Team Members Present: Physician leading conference: Dr. Faith Rogue Nurse Present: Gregor Hams, RN PT Present: Other (comment) Sherrine Maples, PT) OT Present: Mackie Pai, OT Other (Discipline and Name): Tora Duck, PPS Coordinato     Current Status/Progress Goal Weekly Team Focus  Medical   severe deconditioning, critical illnes myopathy. after respiratory failure, bilateral dvt's. requiring oxgyen still  improve stamina and strength  dvt rx. increase stamina. pain control   Bowel/Bladder   continent of bowel/bladder  continent of bowel/bladder  N/A   Swallow/Nutrition/ Hydration             ADL's   max assist  min-supervision      Mobility   Max assist sit>stand, min/mod assist scoot transfers. Still unable to ambulate outside of parallel bars. Fatigue and weakness limiting factors.  supervision/min assist  Endurance training, global muscle strengthening, sit <> stands with RW begin to attempt ambulation with RW.   Communication    Safety/Cognition/ Behavioral Observations            Pain   taking oxy daily before therapy  pain managed with prn meds and tolerating therapy  monitor effectiveness of medication   Skin   n/a          Rehab Goals Patient on target to meet rehab goals: Yes *See Interdisciplinary Assessment and Plan and progress notes for long and short-term goals  Barriers to Discharge: stamina, hypoxia    Possible Resolutions to Barriers:  home o2, supervision at home    Discharge Planning/Teaching Needs:  Home with husband and family to provide 24/7 assistance      Team Discussion:  Pt remains very motivated and making good gains, however, continues to be very deconditioned and easily fatigues.  Still requiring O2 and anticipate she will need O2 at home.  Have just begun working on ambulation but requires max assist sit - stand.  When she is fatigued, can require +2 assistance.    Revisions to Treatment Plan:  None changes at this time.   Continued Need for Acute Rehabilitation Level of Care: The patient requires daily medical management by a physician with specialized training in physical medicine and rehabilitation for the following conditions: Daily direction of a multidisciplinary physical rehabilitation program to ensure safe treatment while eliciting the highest outcome that is of practical value to the patient.: Yes Daily medical management of patient stability for increased activity during participation in an intensive rehabilitation regime.: Yes Daily analysis of laboratory values and/or radiology reports with any subsequent need for medication adjustment of medical intervention for :  Cardiac problems;Pulmonary problems;Neurological problems (dvt's)  Dorothy Polhemus 09/30/2012, 2:27 PM

## 2012-09-30 NOTE — Progress Notes (Signed)
Occupational Therapy Note  Patient Details  Name: Angeli Demilio MRN: [id number] Date of Birth: 08/15/1955 Today's Date: 09/30/2012  Time: 10am-11am ( .)  Pt seen for 1:1 OT session with COTA to address BADL's, transfers, energy conservation and activity tolerance. Pt sitting in wheelchair upon arrival, stating she was tired from morning therapies. Pt requested sink bath today, which was completed from wheelchair with sit <> stand 2x with max A +2 (for safety) during bathing and dressing. During sit to stands, pt requires verbal cues for "nose over toes" in shifting her weight when standing and sometimes requires multiple attempts to stand. O2 left on during session and O2 sats remained in mid to high 90's throughout session. Pt min A for UB bathing and dressing, mod A for LB (max A for TED hose.) Rehearsed how to line up wheelchair for chair <> bed transfer with pt able to demonstrate.  Pt reported pain of 5/10 in bilateral knees (possibly from multiple sit <> stands today during therapies.)    Cale Decarolis Hessie Diener 09/30/2012, 12:46 PM

## 2012-09-30 NOTE — Progress Notes (Signed)
Subjective/Complaints: Worked hard with therapy yesterday. Still sob with activity. On oxygen essentially at all times. A 12 point review of systems has been performed and if not noted above is otherwise negative.   Objective: Vital Signs: Blood pressure 118/68, pulse 87, temperature 98 F (36.7 C), temperature source Oral, resp. rate 20, height 5\' 7"  (1.702 m), weight 99.746 kg (219 lb 14.4 oz), SpO2 95.00%. No results found. No results found for this basename: WBC, HGB, HCT, PLT,  in the last 72 hours No results found for this basename: NA, K, CL, CO, GLUCOSE, BUN, CREATININE, CALCIUM,  in the last 72 hours CBG (last 3)  No results found for this basename: GLUCAP,  in the last 72 hours  Wt Readings from Last 3 Encounters:  09/24/12 99.746 kg (219 lb 14.4 oz)  09/23/12 113.399 kg (250 lb)  08/11/12 168.9 kg (372 lb 5.7 oz)    Physical Exam:  Constitutional: She is oriented to person, place, and time. She appears well-developed. obese  HENT:  Head: Normocephalic, oral mucosa pink and moist.  Eyes:  Pupils round and reactive to light  Neck: Normal range of motion. Neck supple. No thyromegaly present.  Cardiovascular: Normal rate and regular rhythm. No murmurs.  Pulmonary/Chest: Effort normal and breath sounds normal. No respiratory distress. She has no wheezes.  Abdominal: Soft. Bowel sounds are normal. She exhibits no distension. There is no tenderness.  Musculoskeletal:  Trace to 1+ edema lower extremities.  NEURO: Decreased sensation to light touch bilaterally at toes. UE 4/5 deltoids, biceps, triceps, WE, HI. LE 2+ HF, 3 KE, 4/5 ADF and APF. DTR'S are 1+.  She is alert and oriented to person, place, and time.  Follows full commands. Has excellent insight and awareness.  Skin: Skin is warm and dry.  Psychiatric: She has a normal mood and affect.    Assessment/Plan: 1. 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 Physiatrist is providing close team supervision and 24 hour management of active medical problems listed below. Physiatrist and rehab team continue to assess barriers to discharge/monitor patient progress toward functional and medical goals.    FIM: FIM - Bathing Bathing Steps Patient Completed: Chest;Right Arm;Left Arm;Abdomen;Front perineal area;Buttocks;Right upper leg;Left upper leg (leaning side to side for buttocks/perineal area) Bathing: 4: Min-Patient completes 8-9 80f 10 parts or 75+ percent  FIM - Upper Body Dressing/Undressing Upper body dressing/undressing steps patient completed: Thread/unthread right bra strap;Thread/unthread left bra strap;Thread/unthread right sleeve of pullover shirt/dresss;Thread/unthread left sleeve of pullover shirt/dress;Put head through opening of pull over shirt/dress;Pull shirt over trunk Upper body dressing/undressing: 4: Min-Patient completed 75 plus % of tasks FIM - Lower Body Dressing/Undressing Lower body dressing/undressing: 1: Two helpers (+2 for safety, one to help stand, one for LB clothing mgt)  FIM - Hotel manager Devices: Grab bar or rail for support Toileting: 0: Activity did not occur  FIM - Archivist Transfers: 0-Activity did not occur  FIM - Banker Devices: Bed rails;Arm rests;HOB elevated Bed/Chair Transfer: 4: Sit > Supine: Min A (steadying pt. > 75%/lift 1 leg);3: Bed > Chair or W/C: Mod A (lift or lower assist);3: Chair or W/C > Bed: Mod A (lift or lower assist)  FIM - Locomotion: Wheelchair Distance: 150 Locomotion: Wheelchair: 5: Travels 150 ft or more: maneuvers on rugs and over door sills with supervision, cueing or coaxing FIM -  Locomotion: Ambulation Locomotion: Ambulation Assistive Devices: Parallel bars Ambulation/Gait Assistance: Not tested  (comment) Locomotion: Ambulation: 0: Activity did not occur  Comprehension Comprehension Mode: Auditory Comprehension: 7-Follows complex conversation/direction: With no assist  Expression Expression Mode: Verbal Expression: 7-Expresses complex ideas: With no assist  Social Interaction Social Interaction: 6-Interacts appropriately with others with medication or extra time (anti-anxiety, antidepressant).  Problem Solving Problem Solving: 6-Solves complex problems: With extra time  Memory Memory: 7-Complete Independence: No helper  Medical Problem List and Plan:  1. 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 DVT Prophylaxis/Anticoagulation:   Venous dopplers show extensive bilateral femoral, popliteal DVT's. Xarelto initiated. All activities resumed yesterday without issue   3. Mood: Zoloft 25 mg daily. Provide emotional support and positive reinforcement . In good spirits considering the situation. 4. Neuropsych: This patient is capable of making decisions on her own behalf.  5. Pulmonary. Continue oxygen as needed at 1-2 L.  -It is possible that she may have flipped off a small clot to her lungs    -continue weaning trials 6. Acute renal failure-resolved.  BUN and CR wnl 7. Clostridium difficile diarrhea. Vancomycin and Flagyl completed 09/23/2012. No current diarrhea. 8. Hypertension. Lasix 40 mg daily, hydralazine 25 mg 4 times daily. Monitor with increased mobility. Normotensive at present.  LOS (Days) 6 A FACE TO FACE EVALUATION WAS PERFORMED  [name] T 09/30/2012 7:55 AM

## 2012-09-30 NOTE — Consult Note (Signed)
09/29/12  NEUROBEHAVIORAL STATUS EXAM - [facility]IAL [place] [facility]   MEDICAL NECESSITY:  Mrs. [name] was seen on the [facility] Unit for a neurobehavioral status exam owing to the patient's diagnosis of myopathy, and to assist in treatment planning.   According to medical records, Mrs. [name] was admitted to the rehab unit owing to functional deficits secondary to critical illness myopathy and deconditioning after VDRF/septic shock. The patient said that she began feeling progressively ill the eventually required hospitalization. Her condition purportedly continued to get worse and she ended up being airlifted to [place]. At one point, her O2 stats percentages were in the 60s.  She has no significant recollection of her admissions prior to rehab.   From an emotional standpoint, Mrs. [name] denied ever being diagnosed with or treated for a mental health disorder. She described her current mood as "pretty good." She reports having a great social support team including her husband and daughter.   PROCEDURES ADMINISTERED: [2 units [id number] on 09/29/12] Diagnostic clinical interview  Review of available records Beck Depression-Fast Screen Mental Status Exam-2 (brief version)  MENTAL STATUS: Mrs. [name]'s mental status exam score was above the cutoff used to indicate severe cognitive impairment or dementia.   Cognitive, Emotional & Behavioral Evaluation: Mrs. [name] was appropriately dressed for season and situation, and she appeared tidy and well-groomed. Normal gait and posture were noted. She was friendly and rapport was easily established. Her speech was as expected and she was able to express ideas effectively. She seemed to understand test directions readily. Her affect was appropriately modulated. Attention and motivation were good. Optimal test taking conditions were maintained.  On a brief self-report inventory, the patient denied experiencing any  significant signs of depression. Suicidal/homicidal ideation, plan or intent was denied. No manic or hypomanic episodes were reported. The patient denied ever experiencing any auditory/visual hallucinations. No major behavioral or personality changes were endorsed.    Overall, Mrs. [name] seems to be adjusting very well considering her complex medical history. It is also positive that she has such a good social support system. From a neuropsychological perspective, her prognosis is good.  In light of these findings, it is my plan to only provide services as requested.   REFERRING DIAGNOSIS: Myopathy  FINAL DIAGNOSIS:  Myopathy   Debbe Mounts, Psy.D.  Clinical Neuropsychologist

## 2012-09-30 NOTE — Progress Notes (Signed)
Physical Therapy Session Note  Patient Details  Name: Pricila Bridge MRN: [id number] Date of Birth: [birth year]  Today's Date: 09/30/2012 Time: 0730-0828 Time Calculation (min): 58 min  Short Term Goals: Week 1:  PT Short Term Goal 1 (Week 1): Pt will tolerate 2 min continuous standing activity with min assist (Downgraded 2/24 due to progress) PT Short Term Goal 2 (Week 1): Pt will propel wheelchair x 200' continuous with supervision PT Short Term Goal 3 (Week 1): Pt will perform bed > chair transfer squat pivot with min assist PT Short Term Goal 4 (Week 1): Pt will perform sit <> stand from mat with min assist. (Goal updated due to progress.)  Skilled Therapeutic Interventions/Progress Updates:    Bed mobility with railing and supervision, pt needed rest upon sitting. Sit >stand from elevated bed with mod assist and cues for bil. LE positioning and anterior trunk translation.Pt ambulated x 6.5' with RW! Pt needed min assist however followed closely with chair.  Wheelchair propulsion x 150' continuous with supervision for strengthening and conditioning.  Sit <> stands from wheelchair with max assist cues for prestand positioning, most assist needed with initiation to clear buttocks then pt does well to complete stand. NuStep x 7 min level 2, RPE =13.   SpO2 88% (at lowest) on 1L, HR 115 bpm (at highest) during session, pt returns to baseline of SpO2= 94-95% on 1L [place] O2, HR = 106 with seated rest.    Therapy Documentation Precautions:  Precautions Precautions: Fall Precaution Comments: currently 02 dependent Restrictions Weight Bearing Restrictions: No Pain: Pain Assessment Pain Assessment: No/denies pain  See FIM for current functional status  Therapy/Group: Individual Therapy  [name] 09/30/2012, 12:07 PM

## 2012-09-30 NOTE — Progress Notes (Signed)
Occupational Therapy Session Note  Patient Details  Name: [name] MRN: [id number] Date of Birth: 1955-10-07  Today's Date: 09/30/2012 Time: 4132-4401 Time Calculation (min): 43 min  Short Term Goals: Week 1:  OT Short Term Goal 1 (Week 1): Patient will transfer to commode with mod assist OT Short Term Goal 2 (Week 1): Patient will complete lower body dressing with mod assist OT Short Term Goal 3 (Week 1): Patient will complete bathing with mod assist OT Short Term Goal 4 (Week 1): Patient will complete grooming with set up assist OT Short Term Goal 5 (Week 1): Patient will transfer to shower bench/seat with mod assist  Skilled Therapeutic Interventions/Progress Updates:    Pt worked on sit to stand and standing balance at the high low table in the gym.  Performed 5 sit to stand intervals with max assist ranging from 30 seconds to pt being able to tolerate 1 min and 40 seconds.  Average time was around 40 seconds overall.  Pt with dyspnea 4/4 after standing intervals and O2 decreasing to 88% on 2Ls.  It dropped to 84% on room air.  In standing had pt engaged in reaching activity to further challenge her balance.  Only needs mod facilitation to maintain standing during playing checkers.  Finished in sitting having pt reach for checkers with both hands with one pound weights on her wrists.  Husband present and very supportive during session as well.  Therapy Documentation Precautions:  Precautions Precautions: Fall Precaution Comments: currently 02 dependent Restrictions Weight Bearing Restrictions: No  Vital Signs: Therapy Vitals Pulse Rate: 89 Oxygen Therapy SpO2: 95 % O2 Device: Nasal cannula O2 Flow Rate (L/min): 2 L/min Pulse Oximetry Type: Intermittent Pain: Pain Assessment Pain Assessment: No/denies pain Pain Score: 0-No pain Patients Stated Pain Goal: 3 Multiple Pain Sites: No  ADL: See FIM for current functional status  Therapy/Group: Individual  Therapy  [name] OTR/L 09/30/2012, 10:17 AM

## 2012-09-30 NOTE — Progress Notes (Signed)
Occupational Therapy Session Note  Patient Details  Name: [name] MRN: [id number] Date of Birth: 1956/07/19  Today's Date: 09/30/2012 Time: 1500-1530 Time Calculation (min): 30 min  Short Term Goals: Week 1:  OT Short Term Goal 1 (Week 1): Patient will transfer to commode with mod assist OT Short Term Goal 2 (Week 1): Patient will complete lower body dressing with mod assist OT Short Term Goal 3 (Week 1): Patient will complete bathing with mod assist OT Short Term Goal 4 (Week 1): Patient will complete grooming with set up assist OT Short Term Goal 5 (Week 1): Patient will transfer to shower bench/seat with mod assist  Skilled Therapeutic Interventions/Progress Updates:  Engaged in therapeutic activity to include activity tolerance, bed mobility techniques, scoot and squat pivot transfers and UB strengthening and endurance by propelling w/c.  Patient on 2 L of O2 throughout session and her sats dropped with each activity.  Patient was able to bring sats up quickly with pursed lip breathing except one time it took almost a full minute.  Her levels dropped to mid 80s except one time down to upper 70s.  Patient assisted back to bed with all items within reach.  Therapy Documentation Precautions:  Precautions Precautions: Fall Precaution Comments: currently 02 dependent Restrictions Weight Bearing Restrictions: No Pain: Reports headache since this morning however still wanted to work.  Declined medication.  Assisted back to bed after session  Therapy/Group: Individual Therapy  Estha Few 09/30/2012, 4:27 PM

## 2012-10-01 ENCOUNTER — Encounter (HOSPITAL_COMMUNITY): Payer: Managed Care, Other (non HMO) | Admitting: Occupational Therapy

## 2012-10-01 ENCOUNTER — Inpatient Hospital Stay (HOSPITAL_COMMUNITY): Payer: Managed Care, Other (non HMO) | Admitting: Physical Therapy

## 2012-10-01 ENCOUNTER — Encounter (HOSPITAL_COMMUNITY): Payer: Managed Care, Other (non HMO)

## 2012-10-01 DIAGNOSIS — I80299 Phlebitis and thrombophlebitis of other deep vessels of unspecified lower extremity: Secondary | ICD-10-CM

## 2012-10-01 DIAGNOSIS — J9600 Acute respiratory failure, unspecified whether with hypoxia or hypercapnia: Secondary | ICD-10-CM

## 2012-10-01 DIAGNOSIS — G7281 Critical illness myopathy: Secondary | ICD-10-CM

## 2012-10-01 MED ORDER — HYDRALAZINE HCL 25 MG PO TABS
25.0000 mg | ORAL_TABLET | Freq: Three times a day (TID) | ORAL | Status: DC
  Administered 2012-10-01 – 2012-10-02 (×3): 25 mg via ORAL
  Filled 2012-10-01 (×6): qty 1

## 2012-10-01 NOTE — Progress Notes (Signed)
Occupational Therapy Session Note  Patient Details  Name: Braelee Herrle MRN: [id number] Date of Birth: [birth year]  Today's Date: 10/01/2012 Time: 4098-1191 Time Calculation (min): 41 min  Skilled Therapeutic Interventions/Progress Updates:    Treatment Session #2) Pt seen for individualized OT treatment session w/ focus on UE strengthening, increase endurance, energy conservation, transfers and functional mobility. Pt used 2# wt for 2 sets of 12 ea bicep curls bilateral UE's, Triceps x2 sets 10 ea, and 1# wt for overhead press x2 set 5 bilateral UE's while seated in w/c. Pt took rest breaks in between sets and O2 SATs remained in upper 90's. Pt then self propelled w/c to/from ADL apartment (taking 2 rest breaks each way) and participated in sit-stand transfers from w/c to RW and then bed x2. Pt required Min-mod A first attempt then Mod assist for second attempt as well as for controlled decent stand-sit from bed (in ADL apartment) to w/c. VC's for energy conservation, rest breaks and hand placement/safety during transfers. Pt on 2L O2 throughout session. Pt left in w/c w/ call bell & phone in reach, husband present.  Therapy Documentation Precautions:  Precautions Precautions: Fall Precaution Comments: currently 02 dependent Restrictions Weight Bearing Restrictions: No   Vital Signs: Therapy Vitals Pulse Rate: 80 Oxygen Therapy SpO2: 98 % O2 Device: Nasal cannula O2 Flow Rate (L/min): 2 L/min Pain: Pain Assessment Pain Assessment: No/denies pain Pain Score: 0-No pain Pain Type: Chronic pain Pain Location: Knee Pain Orientation: Right;Left Pain Descriptors: Aching Pain Frequency: Intermittent Pain Onset: Gradual Patients Stated Pain Goal: 3 Pain Intervention(s): Medication (See eMAR);Repositioned Multiple Pain Sites: No       See FIM for current functional status  Therapy/Group: Individual Therapy  Alm Bustard 10/01/2012, 12:11 PM

## 2012-10-01 NOTE — Progress Notes (Addendum)
Physical Therapy Session Note  Patient Details  Name: [name] MRN: [id number] Date of Birth: 1955-12-27  Today's Date: 10/01/2012 Time:Session #1:  4696-2952, Session #2: [id number] Time Calculation (min): Session #1: 57 min, Session #2: 30 min   Short Term Goals: Week 1:  PT Short Term Goal 1 (Week 1): Pt will tolerate 2 min continuous standing activity with min assist (Downgraded 2/24 due to progress) PT Short Term Goal 2 (Week 1): Pt will propel wheelchair x 200' continuous with supervision PT Short Term Goal 3 (Week 1): Pt will perform bed > chair transfer squat pivot with min assist PT Short Term Goal 4 (Week 1): Pt will perform sit <> stand from mat with min assist. (Goal updated due to progress.)  Skilled Therapeutic Interventions/Progress Updates:    Session #1: This session focused on WC mobility to and from the gym > 150' x 2 with supervision.  Gait with RW 16'x2, 10' x 1, 18' x 1 with seated rest breaks in between.  O2 sats on 1 L O2 [place] ranged from 90-98% during mobility.  Sit <-> stand transitions ranged from mod assist to 2 person assist after fatigued from lower recliner chair.  Cues for "nose over toes".  Seated exercises: heel/toe raises, LAQs 1 x 10 no weights, 1 x 10 3# weights, hip adduction against ball, seated marches, elbow flexion with 4 # weighted bar, shoulder presses with 4# weighted bar, hip abduct against orange t-band, hamstring curls against orange t-band x 10 each bil.  Seated gastroc/hamstring stretch 1 x ea bil x 90 sec holds.   Session #2: WC mobility from room to far ortho gym supervision with 2 seated rest breaks >200'.  Car transfer with daughter, husband and pt educated on strategies to make it easier mod assist.  Sit to stand from [facility] mod assist to support trunk over weak legs and to help anteriorly weight shift body over feet.  Used "one-two-three" momentum to get to feet.  Verbal cues for safe hand placement during transfers.  Pt can be as good as min assist  depending on fatigue level and surface height.    Therapy Documentation Precautions:  Precautions Precautions: Fall Precaution Comments: trying to wean O2, used 1 L today and stayed in the 90s Restrictions Weight Bearing Restrictions: No   Vital Signs: Therapy Vitals Pulse Rate: 80 Oxygen Therapy SpO2: 98 % O2 Device: Nasal cannula O2 Flow Rate (L/min): 2 L/min Pain: Pain Assessment Pain Assessment: No/denies pain Pain Score: 0-No pain   Locomotion : Ambulation Ambulation/Gait Assistance: 4: Min assist Wheelchair Mobility Distance: 150    See FIM for current functional status  Therapy/Group: Individual Therapy  Lurena Joiner B. Moroni Nester, PT, DPT [phone]   10/01/2012, 12:15 PM

## 2012-10-01 NOTE — Progress Notes (Signed)
Subjective/Complaints: No new complaints. Did well with therapies. Still trying to wean oxygen. Fatigues easily A 12 point review of systems has been performed and if not noted above is otherwise negative.   Objective: Vital Signs: Blood pressure 101/67, pulse 88, temperature 98.2 F (36.8 C), temperature source Oral, resp. rate 20, height 5\' 7"  (1.702 m), weight 99.746 kg (219 lb 14.4 oz), SpO2 98.00%. No results found. No results found for this basename: WBC, HGB, HCT, PLT,  in the last 72 hours No results found for this basename: NA, K, CL, CO, GLUCOSE, BUN, CREATININE, CALCIUM,  in the last 72 hours CBG (last 3)  No results found for this basename: GLUCAP,  in the last 72 hours  Wt Readings from Last 3 Encounters:  09/24/12 99.746 kg (219 lb 14.4 oz)  09/23/12 113.399 kg (250 lb)  08/11/12 168.9 kg (372 lb 5.7 oz)    Physical Exam:  Constitutional: She is oriented to person, place, and time. She appears well-developed. obese  HENT:  Head: Normocephalic, oral mucosa pink and moist.  Eyes:  Pupils round and reactive to light  Neck: Normal range of motion. Neck supple. No thyromegaly present.  Cardiovascular: Normal rate and regular rhythm. No murmurs.  Pulmonary/Chest: Effort normal and breath sounds normal. No respiratory distress. She has no wheezes.  Abdominal: Soft. Bowel sounds are normal. She exhibits no distension. There is no tenderness.  Musculoskeletal:  Trace to 1+ edema lower extremities.  NEURO: Decreased sensation to light touch bilaterally at toes. UE 4/5 deltoids, biceps, triceps, WE, HI. LE 2+ HF, 3 KE, 4/5 ADF and APF. DTR'S are 1+.  She is alert and oriented to person, place, and time.  Follows full commands. Has excellent insight and awareness.  Skin: Skin is warm and dry.  Psychiatric: She has a normal mood and affect.    Assessment/Plan: 1. 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 Physiatrist is providing close team supervision and 24 hour management of active medical problems listed below. Physiatrist and rehab team continue to assess barriers to discharge/monitor patient progress toward functional and medical goals.    FIM: FIM - Bathing Bathing Steps Patient Completed: Chest;Right Arm;Abdomen;Left Arm;Front perineal area;Buttocks;Right upper leg;Left upper leg Bathing: 4: Min-Patient completes 8-9 48f 10 parts or 75+ percent (pt not able to cross legs to assist with LB bathing yet)  FIM - Upper Body Dressing/Undressing Upper body dressing/undressing steps patient completed: Thread/unthread right bra strap;Thread/unthread left bra strap;Thread/unthread right sleeve of pullover shirt/dresss;Thread/unthread left sleeve of pullover shirt/dress;Put head through opening of pull over shirt/dress;Pull shirt over trunk Upper body dressing/undressing: 4: Min-Patient completed 75 plus % of tasks FIM - Lower Body Dressing/Undressing Lower body dressing/undressing steps patient completed: Thread/unthread right underwear leg;Thread/unthread left underwear leg;Thread/unthread right pants leg;Thread/unthread left pants leg (Pt completed 50% of pulling underwear and pants up) Lower body dressing/undressing: 3: Mod-Patient completed 50-74% of tasks (With +2 in standing for safety)  FIM - Toileting Toileting Assistive Devices: Grab bar or rail for support Toileting: 0: Activity did not occur  FIM - Archivist Transfers: 0-Activity did not occur  FIM - Banker Devices: Bed rails;Arm rests Bed/Chair Transfer: 2: Chair or W/C > Bed: Max A (lift and lower assist) (Max A +2 for safety. Pt fatigued at end of session)  FIM - Locomotion: Wheelchair Distance: 150 Locomotion: Wheelchair: 5: [employer]  150 ft or more: maneuvers on rugs and over  door sills with supervision, cueing or coaxing FIM - Locomotion: Ambulation Locomotion: Ambulation Assistive Devices: Walker - Rolling Ambulation/Gait Assistance: 4: Min assist Locomotion: Ambulation: 1: Travels less than 50 ft with minimal assistance (Pt.>75%)  Comprehension Comprehension Mode: Auditory Comprehension: 7-Follows complex conversation/direction: With no assist  Expression Expression Mode: Verbal Expression: 7-Expresses complex ideas: With no assist  Social Interaction Social Interaction: 6-Interacts appropriately with others with medication or extra time (anti-anxiety, antidepressant).  Problem Solving Problem Solving: 6-Solves complex problems: With extra time  Memory Memory: 7-Complete Independence: No helper  Medical Problem List and Plan:  1. 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 DVT Prophylaxis/Anticoagulation:   Venous dopplers show extensive bilateral femoral, popliteal DVT's. Xarelto initiated. All activities resumed yesterday without issue   3. Mood: Zoloft 25 mg daily. Provide emotional support and positive reinforcement . In good spirits considering the situation. 4. Neuropsych: This patient is capable of making decisions on her own behalf.  5. Pulmonary. Continue oxygen as needed at 1-2 L.  -It is possible that she may have embolized a small clot to her lungs    -continue weaning trials 6. Acute renal failure-resolved.  BUN and CR wnl 7. Clostridium difficile diarrhea. Vancomycin and Flagyl completed 09/23/2012. No current diarrhea. 8. Hypertension. Lasix 40 mg daily, hydralazine 25 mg 4 times daily. Monitor with increased mobility. BP's beginning to run too low  -decrease hydralazine to q8 hours and wean further as tolerated  LOS (Days) 7 A FACE TO FACE EVALUATION WAS PERFORMED  SWARTZ,ZACHARY T 10/01/2012 8:02 AM

## 2012-10-01 NOTE — Progress Notes (Signed)
Occupational Therapy Session Note  Patient Details  Name: [name] MRN: [id number] Date of Birth: Dec 18, 1955  Today's Date: 10/01/2012 Time: 4696-2952 Time Calculation (min): 54 min  Skilled Therapeutic Interventions/Progress Updates:    Pt seen for individual OT treatment session for bathing and dressing tasks at shower level with focus on increase endurance, activity tolerance, energy conservation techniques, functional mobility/transfers. Pt transfers fluctuated from Mod-Max assist as pt began to fatigue throughout session. O2 SATs were monitored throughout and were in the 90's except 1x after completing shower when they decreased into 80's. O2 was reapplied at that time and returned to 94% almost imeadiately. Pt continues to benefit from VC's for rest breaks and energy conservation tech's during functional activities. Pt was Max assist sit-stand at sink to don underwear/pants and was noted to have narrow BOS. Pt was educated verbally on shoulder width BOS, "nose over toes', hand placement for controlled sit-stand and stand-sit tech's.  Therapy Documentation Precautions:  Precautions Precautions: Fall Precaution Comments: currently 02 dependent Restrictions Weight Bearing Restrictions: No General:   Vital Signs: Therapy Vitals Pulse Rate: 80 Oxygen Therapy SpO2: 98 % O2 Device: Nasal cannula O2 Flow Rate (L/min): 2 L/min Pain: Pain Assessment Pain Assessment: No/denies pain, pt c/o intermittant nausea Pain Score: 0-No pain       See FIM for current functional status  Therapy/Group: Individual Therapy  Alm Bustard 10/01/2012, 10:49 AM

## 2012-10-02 ENCOUNTER — Inpatient Hospital Stay (HOSPITAL_COMMUNITY): Payer: Managed Care, Other (non HMO) | Admitting: Occupational Therapy

## 2012-10-02 ENCOUNTER — Inpatient Hospital Stay (HOSPITAL_COMMUNITY): Payer: Managed Care, Other (non HMO) | Admitting: Physical Therapy

## 2012-10-02 MED ORDER — HYDRALAZINE HCL 10 MG PO TABS
10.0000 mg | ORAL_TABLET | Freq: Three times a day (TID) | ORAL | Status: DC
  Administered 2012-10-02 – 2012-10-07 (×15): 10 mg via ORAL
  Filled 2012-10-02 (×18): qty 1

## 2012-10-02 NOTE — Progress Notes (Signed)
Social Work Patient ID: [name], female   DOB: 1956-01-26, 57 y.o.   MRN: [id number]  Met with patient following team conference to review targeted d/c date of 3/12 with supervision to minimal assistance goals.  Pt agreeable with these targets and denies any concerns about family ability to meet her assist needs at home.  Pt pleased with progress she is making and is encouraged.  Continue to follow.  Mazelle Huebert, LCSW

## 2012-10-02 NOTE — Progress Notes (Signed)
Physical Therapy Session Note  Patient Details  Name: [name] MRN: [id number] Date of Birth: 11-19-1955  Today's Date: 10/02/2012 Time: 0830-0928 Time Calculation (min): 58 min  Short Term Goals: Week 1:  PT Short Term Goal 1 (Week 1): Pt will tolerate 2 min continuous standing activity with min assist (Downgraded 2/24 due to progress) PT Short Term Goal 2 (Week 1): Pt will propel wheelchair x 200' continuous with supervision PT Short Term Goal 3 (Week 1): Pt will perform bed > chair transfer squat pivot with min assist PT Short Term Goal 4 (Week 1): Pt will perform sit <> stand from mat with min assist. (Goal updated due to progress.)  Skilled Therapeutic Interventions/Progress Updates:    Wheelchair propulsion for strength and conditioning 2 x 200' with supervision, pt able to perform continuously however was fatigued at end of each. Ambulation 1 x 15', 1 x 23' with RW, min assist once in standing. Standing tolerance activities with "Just Dance" utilizing Wii, pt able to tolerate multiple bouts of 2.5 min continuous standing with RW support and min-guard assist, intermittently able to lift one UE. Pt needs extended and frequent rest breaks secondary to significant deconditioning.  Sit > stands continue to be biggest barrier for pt however she is making gains. Pt requires mod assist to stand from wheelchair however can stand with min assist if she stands from an elevated mat.   Therapy Documentation Precautions:  Precautions Precautions: Fall Precaution Comments: trying to wean O2 Restrictions Weight Bearing Restrictions: No Vital Signs: Therapy Vitals BP: 120/74 mmHg Patient Position, if appropriate: Sitting Oxygen Therapy SpO2: 88 % (at lowest during PT, returns to >94% at rest) O2 Device: Nasal cannula O2 Flow Rate (L/min): 1 L/min Pulse Oximetry Type: Intermittent Pain: Pain Assessment Pain Assessment: No/denies pain  See FIM for current functional  status  Therapy/Group: Individual Therapy  [name] 10/02/2012, 12:10 PM

## 2012-10-02 NOTE — Progress Notes (Signed)
Occupational Therapy Session Note  Patient Details  Name: [name] MRN: [id number] Date of Birth: 02/19/1956  Today's Date: 10/02/2012 Time: 4132-4401 Time Calculation (min): 47 min  Short Term Goals: Week 1:  OT Short Term Goal 1 (Week 1): Patient will transfer to commode with mod assist OT Short Term Goal 2 (Week 1): Patient will complete lower body dressing with mod assist OT Short Term Goal 3 (Week 1): Patient will complete bathing with mod assist OT Short Term Goal 4 (Week 1): Patient will complete grooming with set up assist OT Short Term Goal 5 (Week 1): Patient will transfer to shower bench/seat with mod assist  Skilled Therapeutic Interventions/Progress Updates:    Pt seen for 1:1 OT session with focus on increasing UB and trunk strength and overall activity tolerance to increase participation in transfers and ADL tasks. Pt with reported fatigue from previous session upon arrival, and refused standing activities, but was willing to engage in seated activity. Engaged in BUE and trunk strengthening exercises beginning with 2lb weight bar and ugrading to 4lb weight ball including shoulder, elbow and hand exercises. Completed seated pushups using push up blocks and seated trunk flexion and rotation exercises using 4lb ball. Completed lateral scoots up and down mat table X2. Completed 5 min on arm bike level 2 resistance with no rest break needed. Frequent O2 monitoring throughout session, with sats ranging from 92% during activity -98% during rest periods. Pt required frequent rest breaks during session due to shortness of breath, but was able to demonstrate pursed lip breathing techniques to recover.  Therapy Documentation Precautions:  Precautions Precautions: Fall Precaution Comments: trying to wean O2, used 1 L today and stayed in the 90s Restrictions Weight Bearing Restrictions: No  Pain: No pain reported this session    See FIM for current functional status  Therapy/Group:  Individual Therapy  [name] 10/02/2012, 2:30 PM

## 2012-10-02 NOTE — Progress Notes (Signed)
Subjective/Complaints: Did well with therapies yesterday. No problems A 12 point review of systems has been performed and if not noted above is otherwise negative.   Objective: Vital Signs: Blood pressure 91/58, pulse 77, temperature 97.7 F (36.5 C), temperature source Oral, resp. rate 17, height 5\' 7"  (1.702 m), weight 102.5 kg (225 lb 15.5 oz), SpO2 94.00%. No results found. No results found for this basename: WBC, HGB, HCT, PLT,  in the last 72 hours No results found for this basename: NA, K, CL, CO, GLUCOSE, BUN, CREATININE, CALCIUM,  in the last 72 hours CBG (last 3)  No results found for this basename: GLUCAP,  in the last 72 hours  Wt Readings from Last 3 Encounters:  10/01/12 102.5 kg (225 lb 15.5 oz)  09/23/12 113.399 kg (250 lb)  08/11/12 168.9 kg (372 lb 5.7 oz)    Physical Exam:  Constitutional: She is oriented to person, place, and time. She appears well-developed. obese  HENT:  Head: Normocephalic, oral mucosa pink and moist.  Eyes:  Pupils round and reactive to light  Neck: Normal range of motion. Neck supple. No thyromegaly present.  Cardiovascular: Normal rate and regular rhythm. No murmurs.  Pulmonary/Chest: Effort normal and breath sounds normal. No respiratory distress. She has no wheezes.  Abdominal: Soft. Bowel sounds are normal. She exhibits no distension. There is no tenderness.  Musculoskeletal:  Trace to 1+ edema lower extremities.  NEURO: Decreased sensation to light touch bilaterally at toes. UE 4/5 deltoids, biceps, triceps, WE, HI. LE 2+ HF, 3 KE, 4/5 ADF and APF. DTR'S are 1+.  She is alert and oriented to person, place, and time.  Follows full commands. Has excellent insight and awareness.  Skin: Skin is warm and dry.  Psychiatric: She has a normal mood and affect.    Assessment/Plan: 1. 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 Physiatrist is providing close team supervision and 24 hour management of active medical problems listed below. Physiatrist and rehab team continue to assess barriers to discharge/monitor patient progress toward functional and medical goals.    FIM: FIM - Bathing Bathing Steps Patient Completed: Chest;Right Arm;Left Arm;Abdomen;Front perineal area;Buttocks;Right upper leg;Left upper leg Bathing: 4: Min-Patient completes 8-9 2f 10 parts or 75+ percent  FIM - Upper Body Dressing/Undressing Upper body dressing/undressing steps patient completed: Thread/unthread right bra strap;Thread/unthread left bra strap;Thread/unthread right sleeve of pullover shirt/dresss;Thread/unthread left sleeve of pullover shirt/dress;Put head through opening of pull over shirt/dress;Pull shirt over trunk Upper body dressing/undressing: 4: Min-Patient completed 75 plus % of tasks FIM - Lower Body Dressing/Undressing Lower body dressing/undressing steps patient completed: Thread/unthread right underwear leg;Thread/unthread left underwear leg;Thread/unthread right pants leg;Thread/unthread left pants leg Lower body dressing/undressing: 3: Mod-Patient completed 50-74% of tasks  FIM - Hotel manager Devices: Grab bar or rail for support Toileting: 0: Activity did not occur  FIM - Archivist Transfers: 0-Activity did not occur  FIM - Banker Devices: Bed rails;Arm rests Bed/Chair Transfer: 5: Supine > Sit: Supervision (verbal cues/safety issues)  FIM - Locomotion: Wheelchair Distance: 150 Locomotion: Wheelchair: 5: Travels 150 ft or more: maneuvers on rugs and over door sills with supervision, cueing or coaxing FIM - Locomotion: Ambulation Locomotion: Ambulation Assistive Devices: Designer, industrial/product Ambulation/Gait Assistance: 4: Min assist Locomotion: Ambulation: 1: Travels less than 50 ft with  minimal assistance (Pt.>75%)  Comprehension Comprehension Mode:  Auditory Comprehension: 7-Follows complex conversation/direction: With no assist  Expression Expression Mode: Verbal Expression: 7-Expresses complex ideas: With no assist  Social Interaction Social Interaction: 6-Interacts appropriately with others with medication or extra time (anti-anxiety, antidepressant).  Problem Solving Problem Solving: 6-Solves complex problems: With extra time  Memory Memory: 7-Complete Independence: No helper  Medical Problem List and Plan:  1. 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 DVT Prophylaxis/Anticoagulation:   Venous dopplers show extensive bilateral femoral, popliteal DVT's. Xarelto initiated. All activities resumed yesterday without issue   3. Mood: Zoloft 25 mg daily. Provide emotional support and positive reinforcement . In good spirits considering the situation. 4. Neuropsych: This patient is capable of making decisions on her own behalf.  5. Pulmonary. Continue oxygen as needed at 1-2 L.  -It is possible that she may have embolized a small clot to her lungs    -continue weaning oxygen as tolerated 6. Acute renal failure-resolved.  BUN and CR wnl 7. Clostridium difficile diarrhea. Vancomycin and Flagyl completed 09/23/2012. No current diarrhea. 8. Hypertension. Lasix 40 mg daily, hydralazine 25 mg 3 times daily. Monitor with increased mobility. BP's beginning to run too low  -decrease hydralazine to 10mg  tid and wean further as tolerated  LOS (Days) 8 A FACE TO FACE EVALUATION WAS PERFORMED  Chloe Bluett T 10/02/2012 7:27 AM

## 2012-10-02 NOTE — Progress Notes (Signed)
Occupational Therapy Session Note  Patient Details  Name: [name] MRN: [id number] Date of Birth: 1955/09/28  Today's Date: 10/02/2012 Time: 0930-1025 Time Calculation (min): 55 min  Short Term Goals: Week 1:  OT Short Term Goal 1 (Week 1): Patient will transfer to commode with mod assist OT Short Term Goal 2 (Week 1): Patient will complete lower body dressing with mod assist OT Short Term Goal 3 (Week 1): Patient will complete bathing with mod assist OT Short Term Goal 4 (Week 1): Patient will complete grooming with set up assist OT Short Term Goal 5 (Week 1): Patient will transfer to shower bench/seat with mod assist  Skilled Therapeutic Interventions/Progress Updates:  Patient found seated in w/c beside bed upon entering room. Patient propelled self in w/c -> sink side for ADL retraining. Patient performed UB/LB bathing & dressing in front of sink in sit>stand position. 02 sats checked throughout session, originally on 2 liters; therapist decreased -> .5 liters and patient able to increase 02 sats with .5 liters through nasal cannula and through pursed lip breathing. Patient also engaged in functional ambulation using rolling walker for toilet transfer (w/c seated outside bathroom door -> BSC seated over elevated toilet seat in bathroom). Patient required mod assist for transfer <>toilet seat. Recommend patient complete toilet transfers through ambulation when she feels up to it, with the help from staff. At end of session patient transferred back to bed with max assist (secondary to therapist having to lift/lower patient). [name] present during entire session and provided hands on assistance prn. Left patient supine in bed with call bell & phone within reach.   Precautions:  Precautions Precautions: Fall Precaution Comments: trying to wean O2, used 1 L today and stayed in the 90s Restrictions Weight Bearing Restrictions: No  See FIM for current functional  status  Therapy/Group: Individual Therapy  Brynnan Rodenbaugh 10/02/2012, 10:32 AM

## 2012-10-02 NOTE — Progress Notes (Signed)
Physical Therapy Weekly Progress Note  Patient Details  Name: [name] MRN: [id number] Date of Birth: 1955-12-24  Today's Date: 10/02/2012 Time: 4098-1191 Time Calculation (min): 29 min  Patient has met 2 and partially met 2 of 4 short term goals.  Pt must have level transfer to perform with min assist, otherwise she requires mod assist to transfer to higher surface. She also is only able to perform sit >stand from a mat with min assist if the mat is elevated. She requires moderate assist to stand from wheelchair.   Patient continues to demonstrate the following deficits: significant weakness and deconditioning. She is unable to attempt actual stairs at this time (needed for home entry), requires numerous rest breaks throughout PT session, is very limited in standing tolerance, has very limited gait distances and therefore will continue to benefit from skilled PT intervention to enhance overall performance with activity tolerance, balance and ability to compensate for deficits.  Patient progressing toward long term goals..  Continue plan of care.  PT Short Term Goals Week 1:  PT Short Term Goal 1 (Week 1): Pt will tolerate 2 min continuous standing activity with min assist (Downgraded 2/24 due to progress) PT Short Term Goal 1 - Progress (Week 1): Met PT Short Term Goal 2 (Week 1): Pt will propel wheelchair x 200' continuous with supervision PT Short Term Goal 2 - Progress (Week 1): Met PT Short Term Goal 3 (Week 1): Pt will perform bed > chair transfer squat pivot with min assist PT Short Term Goal 3 - Progress (Week 1): Partly met (surfaces must be level) PT Short Term Goal 4 (Week 1): Pt will perform sit <> stand from mat with min assist. (Goal updated due to progress.) PT Short Term Goal 4 - Progress (Week 1): Partly met (mat must be elevated)  Skilled Therapeutic Interventions/Progress Updates:    Wheelchair propulsion x 150' with supervision for strength and conditioning. Sit  <>stands from elevated mat, min assist. With lowered mat and fatigue pt required mod assist. Toe taps in standing with RW 2 x 10 reps for single limb stance balance/strength, seated rest between sets. Step ups to 4" step with bil. Railing mod assist, pt only able to perform 2 x and needed a seated rest break in between. Scoot transfer wheelchair>mat with min assist, increased time to level surface.   SpO2 89% at lowest on 1L [place] O2, HR <104 this session.   Therapy Documentation Precautions:  Precautions Precautions: Fall Precaution Comments: trying to wean O2 Restrictions Weight Bearing Restrictions: No Pain: Pain Assessment Pain Assessment: No/denies pain  See FIM for current functional status  Therapy/Group: Individual Therapy  [name] 10/02/2012, 3:44 PM

## 2012-10-02 NOTE — Progress Notes (Signed)
Reviewed and agree with the attached treatment note.  Lastacia Solum, OTR/L 

## 2012-10-03 ENCOUNTER — Inpatient Hospital Stay (HOSPITAL_COMMUNITY): Payer: Managed Care, Other (non HMO) | Admitting: Occupational Therapy

## 2012-10-03 ENCOUNTER — Inpatient Hospital Stay (HOSPITAL_COMMUNITY): Payer: Managed Care, Other (non HMO)

## 2012-10-03 DIAGNOSIS — I80299 Phlebitis and thrombophlebitis of other deep vessels of unspecified lower extremity: Secondary | ICD-10-CM

## 2012-10-03 DIAGNOSIS — G7281 Critical illness myopathy: Secondary | ICD-10-CM

## 2012-10-03 DIAGNOSIS — J9600 Acute respiratory failure, unspecified whether with hypoxia or hypercapnia: Secondary | ICD-10-CM

## 2012-10-03 MED ORDER — ADULT MULTIVITAMIN W/MINERALS CH
1.0000 | ORAL_TABLET | Freq: Every day | ORAL | Status: DC
  Administered 2012-10-03 – 2012-10-15 (×13): 1 via ORAL
  Filled 2012-10-03 (×15): qty 1

## 2012-10-03 MED ORDER — ENSURE COMPLETE PO LIQD
237.0000 mL | ORAL | Status: DC
  Administered 2012-10-03 – 2012-10-14 (×12): 237 mL via ORAL

## 2012-10-03 NOTE — Progress Notes (Signed)
Reviewed and agree with the attached treatment note.  Wash Nienhaus, OTR/L 

## 2012-10-03 NOTE — Progress Notes (Signed)
Occupational Therapy Weekly Progress Note & Session Note  Patient Details  Name: [name] MRN: [id number] Date of Birth: 1955/10/16  Today's Date: 10/03/2012  WEEKLY PROGRESS NOTE Patient has met 3 of 5 short term goals.  Patient is making steady progress on CIR. Patient requires total assist for LB dressing (not meeting mod assist LB dressing STG) and patient requires max assist (assistance to lift and lower) for shower stall transfers on/off tub transfer bench (not meeting mod assist shower transfer STG). Patient continues to require 1-2 liters of 02 with all activity. [name] has been present for most therapy sessions assisting prn. Patient inconsistent with sit<>stands and inconsistent with level of assistance needed for transfers.   Patient continues to demonstrate the following deficits: decreased overall activity tolerance/endurance, poor 02 support, decreased independence with sit<>stands, decreased dynamic standing balance/tolerance/endurance, decreased independence with overall mobility and transfers. Therefore, patient will continue to benefit from skilled OT intervention to enhance overall performance with BADL, iADL and Reduce care partner burden.  Patient progressing toward long term goals..  Continue plan of care.  OT Short Term Goals Week 1:  OT Short Term Goal 1 (Week 1): Patient will transfer to commode with mod assist OT Short Term Goal 1 - Progress (Week 1): Met OT Short Term Goal 2 (Week 1): Patient will complete lower body dressing with mod assist OT Short Term Goal 2 - Progress (Week 1): Not met OT Short Term Goal 3 (Week 1): Patient will complete bathing with mod assist OT Short Term Goal 3 - Progress (Week 1): Met OT Short Term Goal 4 (Week 1): Patient will complete grooming with set up assist OT Short Term Goal 4 - Progress (Week 1): Met OT Short Term Goal 5 (Week 1): Patient will transfer to shower bench/seat with mod assist OT Short Term Goal 5 -  Progress (Week 1): Not met  Week 2:  OT Short Term Goal 1 (Week 2): Patient will complete LB dressing with moderate assistance OT Short Term Goal 2 (Week 2): Patient will transfer <> shower bench/seat with moderate assistance OT Short Term Goal 3 (Week 2): Patient will consistantlly perform sit<>stand with moderate assistance to help increase independence with ADLs in standing OT Short Term Goal 4 (Week 2): Patient will consistantly perform transfers with moderate assistance; squat pivot transfers OT Short Term Goal 5 (Week 2): Energy conservation techniques and importance of energy conservation will be discussed with patient  Skilled Therapeutic Interventions/Progress Updates:  Community reintegration;Balance/vestibular training;Discharge planning;Disease mangement/prevention;DME/adaptive equipment instruction;Functional mobility training;Neuromuscular re-education;Pain management;Patient/family education;Psychosocial support;Self Care/advanced ADL retraining;Skin care/wound managment;Splinting/orthotics;Therapeutic Activities;Therapeutic Exercise;UE/LE Strength taining/ROM;UE/LE Coordination activities;Wheelchair propulsion/positioning   Precautions:  Precautions Precautions: Fall Precaution Comments: trying to wean O2, used 1 L today and stayed in the 90s Restrictions Weight Bearing Restrictions: No  Vital Signs: Therapy Vitals Temp: 97.8 F (36.6 C) Temp src: Oral Pulse Rate: 90 Resp: 17 BP: 126/60 mmHg Patient Position, if appropriate: Lying Oxygen Therapy SpO2: 99 % O2 Device: None (Room air)  Pain: Pain Assessment Pain Assessment: 0-10 Pain Score:   6 Pain Type: Chronic pain Pain Location: Knee Pain Orientation: Right;Left Pain Descriptors: Aching Pain Frequency: Intermittent Pain Intervention(s): Medication (See eMAR)  See FIM for current functional status  ------------------------------------------------------------------------------------------------  SESSION  NOTE  Session #1 [id number] - 55 Minutes Individual Therapy No complaints of pain Patient found seated edge of bed eating breakfast. Patient engaged in edge of bed -> w/c transfer with close supervision (going downhill). Patient then propelled self into bathroom for  shower stall transfer on/off tub transfer bench. Patient performed UB/LB bathing in seated position with supervision. Patient then transferred out of shower into w/c for UB/LB dressing at sink side. Patient able to perform UB dressing with supervision and LB dressing with total assist, patient required total assist X2 to stand at sink in order to pull pants up to waist. Patient then performed grooming tasks seated at sink in w/c with set-up assistance. Focused skilled intervention on transfers, overall activity tolerance/endurance, energy conservation techniques, UB/LB bathing & dressing, sit<>stands, dynamic standing balance/tolerance/endurance, and grooming tasks seated at sink. [name] present during entire session. At end of session left patient seated in w/c with bilateral leg rests donned and call bell & phone within reach.   Session #2 [id number] - 45 Minutes Individual Therapy No complaints of pain Patient found seated in w/c beside bed. Patient propelled self from room -> ADL apartment for education regarding walk-in shower transfers on/off built-in shower seat. Patient with 1 liter of 02 donned entire session and 02 sats remained <90%. Patient then propelled self -> therapy gym for therapeutic exercise focusing on scoot/squat pivot transfers, sit<>stands, and UE exercises. Patient able to perform 3 supervision sit<>stands with the correct body mechanics. Educated patient on safe and effective sit<>stands using correct body mechanics. Patient propelled self back to room and transferred back to bed with mod assist for sit->stand. Left patient supine in bed with call bell & phone within reach.   Caidyn Henricksen 10/03/2012, 8:56  AM

## 2012-10-03 NOTE — Progress Notes (Signed)
INITIAL NUTRITION ASSESSMENT  DOCUMENTATION CODES Per approved criteria  -Non-severe (moderate) malnutrition in the context of acute illness or injury -Obesity Unspecified   INTERVENTION: 1. Ensure Complete po daily, each supplement provides 350 kcal and 13 grams of protein. 2. MVI daily 3. RD to continue to follow nutrition care plan  NUTRITION DIAGNOSIS: Unintentional wt loss related to acute illness and prolonged hospitalization as evidenced by wt loss of 10% x at least 2 months.   Goal: Intake to meet >90% of estimated nutrition needs.  Monitor:  weight trends, lab trends, I/O's, PO intake, supplement tolerance  Reason for Assessment: Malnutrition Screening  57 y.o. female  Admitting Dx: Myopathy  ASSESSMENT: Admitted to [facility] on 12/31 with hypoxemic respiratory failure presumably from H1N1 PNA. Transferred to [facility] on 1/17. Pt admitted to rehab on 2/19. Per Malnutrition Screening Tool, pt reports weight loss. She notes that her normal weight PTA was 250 lb. She is currently down to 225 lb, a weight change of 25 lb x 2 months or 10%. This is significant. Pt reports that she suspects she had weight loss 2/2 enteral feeding. She notes that her appetite is good at this time but her energy level comes and goes. Agreeable to supplementing diet with Ensure Complete - pt has increased protein and kcal needs.  Pt meets criteria for moderate MALNUTRITION in the context of acute illness as evidenced by 10% wt loss x 2 months and <75% of estimated energy intake x at least 5 days.   Height: Ht Readings from Last 1 Encounters:  09/24/12 5\' 7"  (1.702 m)    Weight: Wt Readings from Last 1 Encounters:  10/01/12 225 lb 15.5 oz (102.5 kg)    Ideal Body Weight: 135 lb/61.4 kg  % Ideal Body Weight: 167%  Wt Readings from Last 10 Encounters:  10/01/12 225 lb 15.5 oz (102.5 kg)  09/23/12 250 lb (113.399 kg)  08/11/12 372 lb 5.7 oz (168.9 kg)    Usual Body Weight:  250 lb  % Usual Body Weight: 90%  BMI:  Body mass index is 35.38 kg/(m^2). Obese Class II  Estimated Nutritional Needs: Kcal: 2000 - 2200 Protein: 90 - 110 grams Fluid:  2 - 2.2 liters  Skin: scratch marks  Diet Order: General  EDUCATION NEEDS: -No education needs identified at this time   Intake/Output Summary (Last 24 hours) at 10/03/12 1049 Last data filed at 10/02/12 1400  Gross per 24 hour  Intake    360 ml  Output      0 ml  Net    360 ml    Last BM: 2/27  Labs:  No results found for this basename: NA, K, CL, CO2, BUN, CREATININE, CALCIUM, MG, PHOS, GLUCOSE,  in the last 168 hours  CBG (last 3)  No results found for this basename: GLUCAP,  in the last 72 hours  Scheduled Meds: . acidophilus  1 capsule Oral Daily  . antiseptic oral rinse  15 mL Mouth Rinse BID  . calcium carbonate  1 tablet Oral TID  . furosemide  40 mg Oral Daily  . hydrALAZINE  10 mg Oral Q8H  . metoprolol tartrate  37.5 mg Oral BID  . pantoprazole  40 mg Oral Daily  . potassium chloride  40 mEq Oral Daily  . [START ON 10/16/2012] rivaroxaban  20 mg Oral Daily  . rivaroxaban  15 mg Oral BID WC  . senna  1 tablet Oral BID  . sertraline  50 mg Oral  QHS    Continuous Infusions:  none  Past Medical History  Diagnosis Date  . Hypertension     Past Surgical History  Procedure Laterality Date  . Cyst excision  1980    Spinal cyst  . Tonsillectomy  1980    Jarold Motto MS, RD, LDN Pager: [phone] After-hours pager: [phone]

## 2012-10-03 NOTE — Progress Notes (Signed)
Physical Therapy Note  Patient Details  Name: Jeannifer Drakeford MRN: [id number] Date of Birth: Feb 21, 1956 Today's Date: 10/03/2012  10:00 - 11:00 60 minutes individual session Patient denies pain.  Patient on 1L O2 during session. O2 sats remained above 90 (92 - 95%) following each activity. Patient propelled wheelchair 150+ feet on level tile using bilateral UE's. Patient sit to stand with mod assist and ambulate with rolling walker and min assist 40 feet x 2. Patient worked on [employer] on sit to stand x5 from 21 inches, x 3 with min assist from 20 inches. Patient exercised on kinetron for 1 minute x 2 with rest inbetween. Patient propelled wheelchair back to room and all items within reach.     Arelia Longest M 10/03/2012, 12:40 PM

## 2012-10-03 NOTE — Progress Notes (Signed)
Patient ID: [name], female   DOB: 04/03/1956, 57 y.o.   MRN: [id number] Subjective/Complaints: 57 year old right-handed white female with history of hypertension admitted to [facility] 08/05/2012 with hypoxemic respiratory failure presumably from H1N1 pneumonia. Patient noted fever, chills and nausea with decreased by mouth intake as well as increasing shortness of breath. She had recently been placed on Tamiflu x5 days but not tested for influenza prior to admission. In the emergency department started on BiPAP. Chest x-ray with diffuse bilateral airspace disease/consolidation right upper lobe without effusion. Placed on broad-spectrum antibiotics. Noted acute renal failure creatinine 2.04 and placed on gentle IV fluids  Did well with therapies yesterday. No problems A 12 point review of systems has been performed and if not noted above is otherwise negative.   Objective: Vital Signs: Blood pressure 126/60, pulse 90, temperature 97.8 F (36.6 C), temperature source Oral, resp. rate 17, height 5\' 7"  (1.702 m), weight 102.5 kg (225 lb 15.5 oz), SpO2 99.00%. No results found. No results found for this basename: WBC, HGB, HCT, PLT,  in the last 72 hours No results found for this basename: NA, K, CL, CO, GLUCOSE, BUN, CREATININE, CALCIUM,  in the last 72 hours CBG (last 3)  No results found for this basename: GLUCAP,  in the last 72 hours  Wt Readings from Last 3 Encounters:  10/01/12 102.5 kg (225 lb 15.5 oz)  09/23/12 113.399 kg (250 lb)  08/11/12 168.9 kg (372 lb 5.7 oz)    Physical Exam:  Constitutional: She is oriented to person, place, and time. She appears well-developed. obese  HENT:  Head: Normocephalic, oral mucosa pink and moist.  Eyes:  Pupils round and reactive to light  Neck: Normal range of motion. Neck supple. No thyromegaly present.  Cardiovascular: Normal rate and regular rhythm. No murmurs.  Pulmonary/Chest: Effort normal and breath sounds normal. No  respiratory distress. She has no wheezes.  Abdominal: Soft. Bowel sounds are normal. She exhibits no distension. There is no tenderness.  Musculoskeletal:  Trace to 1+ edema lower extremities.  NEURO: Decreased sensation to light touch bilaterally at toes. UE 4/5 deltoids, biceps, triceps, WE, HI. LE 2+ HF, 3 KE, 4/5 ADF and APF. DTR'S are 1+.  She is alert and oriented to person, place, and time.  Follows full commands. Has excellent insight and awareness.  Skin: Skin is warm and dry.  Psychiatric: She has a normal mood and affect.    Assessment/Plan: 1. 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 Physiatrist is providing close team supervision and 24 hour management of active medical problems listed below. Physiatrist and rehab team continue to assess barriers to discharge/monitor patient progress toward functional and medical goals.    FIM: FIM - Bathing Bathing Steps Patient Completed: Chest;Right Arm;Left Arm;Abdomen;Front perineal area;Buttocks;Right upper leg;Left upper leg;Right lower leg (including foot);Left lower leg (including foot) Bathing: 5: Supervision: Safety issues/verbal cues (sitting)  FIM - Upper Body Dressing/Undressing Upper body dressing/undressing steps patient completed: Thread/unthread right bra strap;Thread/unthread left bra strap;Hook/unhook bra;Thread/unthread right sleeve of pullover shirt/dresss;Put head through opening of pull over shirt/dress;Pull shirt over trunk;Thread/unthread left sleeve of pullover shirt/dress Upper body dressing/undressing: 5: Supervision: Safety issues/verbal cues FIM - Lower Body Dressing/Undressing Lower body dressing/undressing steps patient completed: Thread/unthread right underwear leg;Thread/unthread left underwear leg;Thread/unthread right pants leg;Thread/unthread  left pants leg Lower body dressing/undressing: 1: Two helpers (for sit->stand)  FIM -  Toileting Toileting Assistive Devices: Grab bar or rail for support Toileting: 0: Activity did not occur  FIM - Diplomatic Services operational officer Devices: Bedside commode;Elevated toilet seat;Walker Toilet Transfers: 0-Activity did not occur  FIM - Banker Devices: Bed rails;Arm rests Bed/Chair Transfer: 3: Bed > Chair or W/C: Mod A (lift or lower assist)  FIM - Locomotion: Wheelchair Distance: 150 Locomotion: Wheelchair: 5: Travels 150 ft or more: maneuvers on rugs and over door sills with supervision, cueing or coaxing FIM - Locomotion: Ambulation Locomotion: Ambulation Assistive Devices: Designer, industrial/product Ambulation/Gait Assistance: 4: Min assist Locomotion: Ambulation: 1: Travels less than 50 ft with minimal assistance (Pt.>75%)  Comprehension Comprehension Mode: Auditory Comprehension: 7-Follows complex conversation/direction: With no assist  Expression Expression Mode: Verbal Expression: 7-Expresses complex ideas: With no assist  Social Interaction Social Interaction: 7-Interacts appropriately with others - No medications needed.  Problem Solving Problem Solving: 7-Solves complex problems: Recognizes & self-corrects  Memory Memory: 7-Complete Independence: No helper  Medical Problem List and Plan:  1. 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 DVT Prophylaxis/Anticoagulation:   Venous dopplers show extensive bilateral femoral, popliteal DVT's. Xarelto initiated. All activities resumed yesterday without issue   3. Mood: Zoloft 25 mg daily. Provide emotional support and positive reinforcement . In good spirits considering the situation. 4. Neuropsych: This patient is capable of making decisions on her own behalf.  5. Pulmonary. Continue oxygen as needed at 1-2 L.  -It is possible that she may have  embolized a small clot to her lungs    -continue weaning oxygen as tolerated 6. Acute renal failure-resolved.  BUN and CR wnl 7. Clostridium difficile diarrhea. Vancomycin and Flagyl completed 09/23/2012. No current diarrhea. 8. Hypertension. Lasix 40 mg daily, hydralazine 25 mg 3 times daily. Monitor with increased mobility. BP's beginning to run too low  -decrease hydralazine to 10mg  tid and wean further as tolerated  LOS (Days) 9 A FACE TO FACE EVALUATION WAS PERFORMED  Erick Colace 10/03/2012 8:47 AM

## 2012-10-03 NOTE — Progress Notes (Signed)
Occupational Therapy Session Note  Patient Details  Name: [name] MRN: [id number] Date of Birth: October 10, 1955  Today's Date: 10/03/2012 Time: 7829-5621 Time Calculation (min): 32 min   Skilled Therapeutic Interventions/Progress Updates:  Pt seen for 1:1 OT session with focus on sit<>stand, UE strengthening and activity tolerance. Pt completed sit<>stand X 4 in gym to complete overhead reaching activity using clothesline. Pt reported fatigue during activity, but was able to maintain standing 15-45 seconds each time. Regular O2 monitoring throughout activity, with sats dropping to 89% with challenging activities, but returning to mid 90's within 3-4 seconds of rest. Completed WC mobility activity from pt's room to gym to family room and back with focus on UE strengthening and increasing activity tolerance. Pt required 1 (5 min) rest break during 15 min WC activity.  Therapy Documentation Precautions:  Precautions Precautions: Fall Precaution Comments: trying to wean O2, used 1 L today and stayed in the 90s Restrictions Weight Bearing Restrictions: No    Pain: no pain reported this session    See FIM for current functional status  Therapy/Group: Individual Therapy  [name] 10/03/2012, 12:07 PM

## 2012-10-04 ENCOUNTER — Inpatient Hospital Stay (HOSPITAL_COMMUNITY): Payer: Managed Care, Other (non HMO)

## 2012-10-04 DIAGNOSIS — I10 Essential (primary) hypertension: Secondary | ICD-10-CM

## 2012-10-04 NOTE — Progress Notes (Signed)
[name] is a 57 y.o. female [birth year] [id number]  Subjective: No new complaints. No new problems. Slept well. Feeling OK.  Objective: Vital signs in last 24 hours: Temp:  [98 F (36.7 C)-98.1 F (36.7 C)] 98 F (36.7 C) (03/01 0532) Pulse Rate:  [78-93] 78 (03/01 0532) Resp:  [17-18] 17 (03/01 0532) BP: (106-116)/(60-75) 106/60 mmHg (03/01 0532) SpO2:  [94 %-98 %] 94 % (03/01 0532) Weight change:  Last BM Date: 10/03/12  Intake/Output from previous day: 02/28 0701 - 03/01 0700 In: 840 [P.O.:840] Out: -  Last cbgs: CBG (last 3)  No results found for this basename: GLUCAP,  in the last 72 hours   Physical Exam General: No apparent distress    HEENT: moist mucosa Lungs: Normal effort. Lungs clear to auscultation, no crackles or wheezes. Cardiovascular: Regular rate and rhythm, no edema Musculoskeletal:  No change from before Neurological: No new neurological deficits Wounds: N/A    Skin: clear Alert, cooperative   Lab Results: BMET    Component Value Date/Time   NA 139 09/25/2012 0645   K 4.5 09/25/2012 0645   CL 102 09/25/2012 0645   CO2 28 09/25/2012 0645   GLUCOSE 109* 09/25/2012 0645   BUN 16 09/25/2012 0645   CREATININE 0.58 09/25/2012 0645   CALCIUM 9.7 09/25/2012 0645   GFRNONAA >90 09/25/2012 0645   GFRAA >90 09/25/2012 0645   CBC    Component Value Date/Time   WBC 5.4 09/25/2012 0645   RBC 3.75* 09/25/2012 0645   HGB 11.1* 09/25/2012 0645   HCT 33.3* 09/25/2012 0645   PLT 233 09/25/2012 0645   MCV 88.8 09/25/2012 0645   MCH 29.6 09/25/2012 0645   MCHC 33.3 09/25/2012 0645   RDW 17.4* 09/25/2012 0645   LYMPHSABS 0.9 09/25/2012 0645   MONOABS 0.5 09/25/2012 0645   EOSABS 0.1 09/25/2012 0645   BASOSABS 0.0 09/25/2012 0645    Studies/Results: No results found.  Medications: I have reviewed the patient's current medications.  Assessment/Plan:  1. 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 DVT Prophylaxis/Anticoagulation:  Venous dopplers show extensive bilateral femoral, popliteal DVT's. Xarelto initiated. All activities resumed yesterday without issue  3. Mood: Zoloft 25 mg daily. Provide emotional support and positive reinforcement . In good spirits considering the situation.  4. Neuropsych: This patient is capable of making decisions on her own behalf.  5. Pulmonary. Continue oxygen as needed at 1-2 L.  -It is possible that she may have embolized a small clot to her lungs  -continue weaning oxygen as tolerated  6. Acute renal failure-resolved. BUN and CR wnl - will re-check later 7. Clostridium difficile diarrhea. Vancomycin and Flagyl completed 09/23/2012. No current diarrhea.  8. Hypertension. Lasix 40 mg daily, hydralazine 25 mg 3 times daily. Monitor with increased mobility. BP's beginning to run too low  -decrease hydralazine to 10mg  tid and wean further as tolerated     Length of stay, days: 10  Sonda Primes , MD 10/04/2012, 8:29 AM

## 2012-10-04 NOTE — Progress Notes (Signed)
Occupational Therapy Session Note  Patient Details  Name: [name] MRN: [id number] Date of Birth: 09-24-1955  Today's Date: 10/04/2012 Time: 1420-1510 Time Calculation (min): 50 min  Short Term Goals: Week 2:  OT Short Term Goal 1 (Week 2): Patient will complete LB dressing with moderate assistance OT Short Term Goal 2 (Week 2): Patient will transfer <> shower bench/seat with moderate assistance OT Short Term Goal 3 (Week 2): Patient will consistantlly perform sit<>stand with moderate assistance to help increase independence with ADLs in standing OT Short Term Goal 4 (Week 2): Patient will consistantly perform transfers with moderate assistance; squat pivot transfers OT Short Term Goal 5 (Week 2): Energy conservation techniques and importance of energy conservation will be discussed with patient  Skilled Therapeutic Interventions/Progress Updates:   Patient seen this afternoon for occupational therapy session with emphasis on functional mobility, transfer training, standing tolerance, standing balance, activity tolerance, energy conservation, and IADL home management kitchen tasks.  Patient self propels wheelchair to/from hospital room using BUEs for improved strength and endurance; no rest breaks required.  Patient on 1L oxygen nasal cannula.  Patient completes wheelchair to/from therapy mat with minimum assistance with RW and completes sit to/from stand trials x 4 in therapy gym while completing reaching tasks to place cones to overhead designated area, as well as complete 2 handed laundry folding task. Patient with stand tolerance from 31 seconds to 1 minute 24 seconds.  Patient sit to/from stand trials range from minimum to moderate assistance.  Patient with improved performance with increased forward trunk flexion when going to standing position.  Patient completes IADL kitchen home management tasks with RW to complete stand step ambulation for length of counter with minimum assistance sit  to/from stand and CGA stand balance while completing reaching task in overhead cabinet to locate/remove/replace 4 assigned items.  Patient with increased fatigue at end of session with stand step ambulation.  Patient denies pain at end of session but rates leg fatigue around a 5/10.  [name] present during session.  Discussed energy conservation techniques for kitchen/home management tasks at discharge (i.e., having chair to sit in for chopping vegetables/preparing food vs. standing at counter).  Husband very encouraging to patient throughout session.  Patient transfers from wheelchair to bed at end of session with moderate stand pivot transfer with RW.  Patient left with husband with phone/call bell in place.  Therapy Documentation Precautions:  Precautions Precautions: Fall Precaution Comments: trying to wean O2, used 1 L today and stayed in the 90s Restrictions Weight Bearing Restrictions: No Pain:  Patient reports receiving pain medication prior to session secondary to B knee pain at pain scale 6/10.  Patient at pain level 2/10 at initiation of session and 0/10 at end of session.  See FIM for current functional status  Therapy/Group: Individual Therapy  Eber Hong 10/04/2012, 3:48 PM

## 2012-10-05 ENCOUNTER — Inpatient Hospital Stay (HOSPITAL_COMMUNITY): Payer: Managed Care, Other (non HMO) | Admitting: Physical Therapy

## 2012-10-05 DIAGNOSIS — R42 Dizziness and giddiness: Secondary | ICD-10-CM | POA: Diagnosis not present

## 2012-10-05 LAB — BASIC METABOLIC PANEL
BUN: 12 mg/dL (ref 6–23)
Chloride: 104 mEq/L (ref 96–112)
GFR calc Af Amer: >90 mL/min (ref >90)
GFR calc non Af Amer: >90 mL/min (ref >90)
Potassium: 4.4 mEq/L (ref 3.5–5.1)
Sodium: 138 mEq/L (ref 135–145)

## 2012-10-05 LAB — CBC
HCT: 30.7 % — ABNORMAL LOW (ref 36.0–46.0)
Hemoglobin: 10.3 g/dL — ABNORMAL LOW (ref 12.0–15.0)
RBC: 3.49 MIL/uL — ABNORMAL LOW (ref 3.87–5.11)
WBC: 6 10*3/uL (ref 4.0–10.5)

## 2012-10-05 NOTE — Progress Notes (Signed)
Tsering Leaman is a 57 y.o. female 07-02-1956 [id number]  Subjective: C/o dizziness this am. It has been recurrent. Worse w/head turning. She had a nl brain MRI 1-2 y ago she says. Slept well. Feeling OK.  Objective: Vital signs in last 24 hours: Temp:  [98.1 F (36.7 C)-98.4 F (36.9 C)] 98.1 F (36.7 C) (03/02 0445) Pulse Rate:  [73-84] 84 (03/02 0654) Resp:  [18] 18 (03/02 0445) BP: (99-128)/(66-80) 128/80 mmHg (03/02 0654) SpO2:  [95 %-98 %] 95 % (03/02 0445) Weight change:  Last BM Date: 10/04/12  Intake/Output from previous day: 03/01 0701 - 03/02 0700 In: 1180 [P.O.:1080] Out: -  Last cbgs: CBG (last 3)  No results found for this basename: GLUCAP,  in the last 72 hours   Physical Exam General: No apparent distress    HEENT: moist mucosa Lungs: Normal effort. Lungs clear to auscultation, no crackles or wheezes. Cardiovascular: Regular rate and rhythm, no edema Musculoskeletal:  No change from before Neurological: No new neurological deficits Wounds: N/A    Skin: clear Alert, cooperative   Lab Results: BMET    Component Value Date/Time   NA 139 09/25/2012 0645   K 4.5 09/25/2012 0645   CL 102 09/25/2012 0645   CO2 28 09/25/2012 0645   GLUCOSE 109* 09/25/2012 0645   BUN 16 09/25/2012 0645   CREATININE 0.58 09/25/2012 0645   CALCIUM 9.7 09/25/2012 0645   GFRNONAA >90 09/25/2012 0645   GFRAA >90 09/25/2012 0645   CBC    Component Value Date/Time   WBC 5.4 09/25/2012 0645   RBC 3.75* 09/25/2012 0645   HGB 11.1* 09/25/2012 0645   HCT 33.3* 09/25/2012 0645   PLT 233 09/25/2012 0645   MCV 88.8 09/25/2012 0645   MCH 29.6 09/25/2012 0645   MCHC 33.3 09/25/2012 0645   RDW 17.4* 09/25/2012 0645   LYMPHSABS 0.9 09/25/2012 0645   MONOABS 0.5 09/25/2012 0645   EOSABS 0.1 09/25/2012 0645   BASOSABS 0.0 09/25/2012 0645    Studies/Results: No results found.  Medications: I have reviewed the patient's current medications.  Assessment/Plan:  1. Deconditioning/critical illness  myopathy/ after VDRF and septic shock  2. DVT Prophylaxis/Anticoagulation: Venous dopplers show extensive bilateral femoral, popliteal DVT's. Xarelto initiated. All activities resumed yesterday without issue  3. Mood: Zoloft 25 mg daily. Provide emotional support and positive reinforcement . In good spirits considering the situation.  4. Neuropsych: This patient is capable of making decisions on her own behalf.  5. Pulmonary. Continue oxygen as needed at 1-2 L.  -It is possible that she may have embolized a small clot to her lungs  -continue weaning oxygen as tolerated  6. Acute renal failure-resolved. BUN and CR wnl - will re-check later 7. Clostridium difficile diarrhea. Vancomycin and Flagyl completed 09/23/2012. No current diarrhea.  8. Hypertension. Lasix 40 mg daily, hydralazine 25 mg 3 times daily. Monitor with increased mobility. BP's beginning to run too low  -decrease hydralazine to 10mg  tid and wean further as tolerated. 9. 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 Meclizine prn     Length of stay, days: 11  Sonda Primes , MD 10/05/2012, 8:34 AM

## 2012-10-05 NOTE — Progress Notes (Signed)
Physical Therapy Note  Patient Details  Name: [name] MRN: [id number] Date of Birth: [birth year] Today's Date: 10/05/2012  1300-1355 (55 minutes) individual Pain: no reported pain, no c/o dizziness this PM Other : Oxygen sats 90%  On 1 L [place] ; pulse 94 (resting) Focus of treatment: Wc mobility for endurance; therapeutic exercise for bilateral LE strengthening/activity tolerance; gait training; Therapeutic activity focused on standing balance/ standing tolerance Treatment: Pt up in wc upon arrival ; wc mobility SBA to/from room 120 feet; transfer RW min assist to Nustep; Nustep Level 4 X 5 minutes for activity tolerance; bilateral LE strengthening in supine X 10 - SLRs, hip abduction, sit to stand from raised mat 3 X 5 with/without UE support; standing tolerance performing Wii bowling 2 X 2-3 minutes min assist; gait 25 feet RW min assist.   HOUT,JIM 10/05/2012, 1:54 PM

## 2012-10-05 NOTE — Progress Notes (Signed)
Scheduled senna held past 2 days for reports of multi stools. Stools continent, not loose. Using bedpan at HS per patient's request. O2 at 1 l/m via [place]. I. S. At bedside-encouraged patient to use. BLE edema, especially to feet. Alfredo Martinez A

## 2012-10-05 NOTE — Progress Notes (Signed)
Patient complained of vertigo this morning, premorbid issue. Reports she has never taken any meds to manage. Reports vertigo may last for 8 weeks at a time. Willing to try med. Alfredo Martinez A

## 2012-10-06 ENCOUNTER — Inpatient Hospital Stay (HOSPITAL_COMMUNITY): Payer: Managed Care, Other (non HMO)

## 2012-10-06 ENCOUNTER — Inpatient Hospital Stay (HOSPITAL_COMMUNITY): Payer: Managed Care, Other (non HMO) | Admitting: Physical Therapy

## 2012-10-06 ENCOUNTER — Inpatient Hospital Stay (HOSPITAL_COMMUNITY): Payer: Managed Care, Other (non HMO) | Admitting: Occupational Therapy

## 2012-10-06 ENCOUNTER — Inpatient Hospital Stay (HOSPITAL_COMMUNITY): Payer: Managed Care, Other (non HMO) | Admitting: *Deleted

## 2012-10-06 DIAGNOSIS — G7281 Critical illness myopathy: Secondary | ICD-10-CM

## 2012-10-06 DIAGNOSIS — J9600 Acute respiratory failure, unspecified whether with hypoxia or hypercapnia: Secondary | ICD-10-CM

## 2012-10-06 DIAGNOSIS — I80299 Phlebitis and thrombophlebitis of other deep vessels of unspecified lower extremity: Secondary | ICD-10-CM

## 2012-10-06 NOTE — Progress Notes (Signed)
Physical Therapy Session Note  Patient Details  Name: [name] MRN: [id number] Date of Birth: 12/18/1955  Today's Date: 10/06/2012 Time: 1030-1130 Time Calculation (min): 60 min  Short Term Goals: Week 2:  PT Short Term Goal 1 (Week 2): Pt will perform sit <> stand from wheelchair consistently with min assist PT Short Term Goal 2 (Week 2): Pt will ambulate 100' with min-guard assist and RW PT Short Term Goal 3 (Week 2): Pt will transfer bed <> wheelchair with min-guard assist PT Short Term Goal 4 (Week 2): Pt will tolerate 3 min continuous standing balance activity.  Skilled Therapeutic Interventions/Progress Updates:    Pt no longer dizzy, vestibular screen not performed.   Session focused on increased strength, endurance, and ambulation distance. Wheelchair propulsion on unit >400' total on unit with supervision, pt able to maneuver without need for rest breaks. Sit <> stands x 10 reps from mat starting at elevated height progressing to lowest height performed all with supervision! No physical assist needed from mat or wheelchair rest of session however pt continues to have delayed knee/hip extension due to bil. LE weakness. Side steps in partially squatted position with min assist and HHA x 3 reps along mat. Standing squats 3 x 5 reps performed for bil. LE strengthening.   Ambulation x 90' with RW and min-guard assist, encouragement to continue and cues for step through pattern. Ambulation 2 x 30' over carpet working on forwards/sidestepping/backwards walking with RW, min-guard assist. Pt continues to be very weak and requires frequent rest breaks, she works until complete fatigue and must have chair near by for when legs "get weak."   Therapy Documentation Precautions:  Precautions Precautions: Fall Precaution Comments: trying to wean O2 Restrictions Weight Bearing Restrictions: No Pain: Pain Assessment Pain Assessment: No/denies pain   See FIM for current functional  status  Therapy/Group: Individual Therapy  [name] 10/06/2012, 11:40 AM

## 2012-10-06 NOTE — Progress Notes (Signed)
Occupational Therapy Note  Patient Details  Name: [name] MRN: [id number] Date of Birth: 1956-01-29 Today's Date: 10/06/2012  Time: 1330-1400 Pt c/o of fatigue in bilateral thighs from morning therapy; RN aware Individual Therapy  Pt in w/c with husband at side.  Pt rolled to gym for sit<>stand and dynamic standing activities including reaching out of BOS.  Pt engaged in squats from w/cX 5.  All sit<>stands with supervision except for last stand requiring min A to initiate task.  Pt controlled sitting with one exception when patient sat uncontrolled.  O2 sats>90% on 1L O2 and HR at 130 after activity but recovered to 110 in approx 30 secs.   Lavone Neri [facility] 10/06/2012, 3:08 PM

## 2012-10-06 NOTE — Progress Notes (Signed)
Patient ID: [name], female   DOB: 07/29/1956, 57 y.o.   MRN: [id number] Subjective/Complaints: 57 year old right-handed white female with history of hypertension admitted to [facility] 08/05/2012 with hypoxemic respiratory failure presumably from H1N1 pneumonia. Patient noted fever, chills and nausea with decreased by mouth intake as well as increasing shortness of breath. She had recently been placed on Tamiflu x5 days but not tested for influenza prior to admission. In the emergency department started on BiPAP. Chest x-ray with diffuse bilateral airspace disease/consolidation right upper lobe without effusion. Placed on broad-spectrum antibiotics. Noted acute renal failure creatinine 2.04 and placed on gentle IV fluids  Did well with therapies yesterday. No problems A 12 point review of systems has been performed and if not noted above is otherwise negative.   Objective: Vital Signs: Blood pressure 118/70, pulse 79, temperature 98.1 F (36.7 C), temperature source Oral, resp. rate 19, height 5\' 7"  (1.702 m), weight 102.5 kg (225 lb 15.5 oz), SpO2 98.00%. No results found.  Recent Labs  10/05/12 1055  WBC 6.0  HGB 10.3*  HCT 30.7*  PLT 266    Recent Labs  10/05/12 1055  NA 138  K 4.4  CL 104  GLUCOSE 102*  BUN 12  CREATININE 0.58  CALCIUM 9.2   CBG (last 3)  No results found for this basename: GLUCAP,  in the last 72 hours  Wt Readings from Last 3 Encounters:  10/01/12 102.5 kg (225 lb 15.5 oz)  09/23/12 113.399 kg (250 lb)  08/11/12 168.9 kg (372 lb 5.7 oz)    Physical Exam:  Constitutional: She is oriented to person, place, and time. She appears well-developed. obese  HENT:  Head: Normocephalic, oral mucosa pink and moist.  Eyes:  Pupils round and reactive to light  Neck: Normal range of motion. Neck supple. No thyromegaly present.  Cardiovascular: Normal rate and regular rhythm. No murmurs.  Pulmonary/Chest: Effort normal and breath sounds normal. No  respiratory distress. She has no wheezes.  Abdominal: Soft. Bowel sounds are normal. She exhibits no distension. There is no tenderness.  Musculoskeletal:  Trace to 1+ edema lower extremities.  NEURO: Decreased sensation to light touch bilaterally at toes. UE 4/5 deltoids, biceps, triceps, WE, HI. LE 2+ HF, 3 KE, 4/5 ADF and APF. DTR'S are 1+.  She is alert and oriented to person, place, and time.  Follows full commands. Has excellent insight and awareness.  Skin: Skin is warm and dry.  Psychiatric: She has a normal mood and affect.    Assessment/Plan: 1. 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 Physiatrist is providing close team supervision and 24 hour management of active medical problems listed below. Physiatrist and rehab team continue to assess barriers to discharge/monitor patient progress toward functional and medical goals.    FIM: FIM - Bathing Bathing Steps Patient Completed: Chest;Right Arm;Left Arm;Abdomen;Front perineal area;Buttocks;Right upper leg;Left upper leg;Right lower leg (including foot);Left lower leg (including foot) Bathing: 5: Supervision: Safety issues/verbal cues (sitting)  FIM - Upper Body Dressing/Undressing Upper body dressing/undressing steps patient completed: Thread/unthread right bra strap;Thread/unthread left bra strap;Hook/unhook bra;Thread/unthread right sleeve of pullover shirt/dresss;Put head through opening of pull over shirt/dress;Pull shirt over trunk;Thread/unthread left sleeve of pullover shirt/dress Upper body dressing/undressing: 5: Supervision: Safety issues/verbal cues FIM - Lower Body Dressing/Undressing Lower body dressing/undressing steps patient completed: Thread/unthread right underwear leg;Thread/unthread left underwear leg;Thread/unthread right pants leg;Thread/unthread  left pants leg Lower body dressing/undressing: 1: Two helpers (for sit->stand)  FIM - Hotel manager Devices: Grab bar or rail for support Toileting: 0: Activity did not occur  FIM - Diplomatic Services operational officer Devices: Bedside commode;Elevated toilet seat;Walker Toilet Transfers: 0-Activity did not occur  FIM - Banker Devices: Bed rails;Arm rests Bed/Chair Transfer: 3: Bed > Chair or W/C: Mod A (lift or lower assist)  FIM - Locomotion: Wheelchair Distance: 200  Locomotion: Wheelchair: 5: Travels 150 ft or more: maneuvers on rugs and over door sills with supervision, cueing or coaxing FIM - Locomotion: Ambulation Locomotion: Ambulation Assistive Devices: Designer, industrial/product Ambulation/Gait Assistance: 4: Min assist Locomotion: Ambulation: 1: Travels less than 50 ft with minimal assistance (Pt.>75%)  Comprehension Comprehension Mode: Auditory Comprehension: 7-Follows complex conversation/direction: With no assist  Expression Expression Mode: Verbal Expression: 7-Expresses complex ideas: With no assist  Social Interaction Social Interaction: 7-Interacts appropriately with others - No medications needed.  Problem Solving Problem Solving: 7-Solves complex problems: Recognizes & self-corrects  Memory Memory: 7-Complete Independence: No helper  Medical Problem List and Plan:  1. 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 DVT Prophylaxis/Anticoagulation:   Venous dopplers show extensive bilateral femoral, popliteal DVT's. Xarelto initiated. All activities resumed yesterday without issue   3. Mood: Zoloft 25 mg daily. Provide emotional support and positive reinforcement . In good spirits considering the situation. 4. Neuropsych: This patient is capable of making decisions on her own behalf.  5. Pulmonary. Continue oxygen as needed at 1-2 L.  -It is possible that she may have  embolized a small clot to her lungs    -continue weaning oxygen as tolerated 6. Acute renal failure-resolved.  BUN and CR wnl 7. Clostridium difficile diarrhea. Vancomycin and Flagyl completed 09/23/2012. No current diarrhea. 8. Hypertension. Lasix 40 mg daily, hydralazine 25 mg 3 times daily. Monitor with increased mobility. BP's beginning to run too low  -decrease hydralazine to 10mg  tid and wean further as tolerated  LOS (Days) 12 A FACE TO FACE EVALUATION WAS PERFORMED  Erick Colace 10/06/2012 9:37 AM

## 2012-10-06 NOTE — Progress Notes (Signed)
Occupational Therapy Session Note  Patient Details  Name: Sama Arauz MRN: [id number] Date of Birth: 1956-01-10  Today's Date: 10/06/2012 Time: 0905-1000 Time Calculation (min): 55 min  Short Term Goals: Week 1:  OT Short Term Goal 1 (Week 1): Patient will transfer to commode with mod assist OT Short Term Goal 1 - Progress (Week 1): Met OT Short Term Goal 2 (Week 1): Patient will complete lower body dressing with mod assist OT Short Term Goal 2 - Progress (Week 1): Not met OT Short Term Goal 3 (Week 1): Patient will complete bathing with mod assist OT Short Term Goal 3 - Progress (Week 1): Met OT Short Term Goal 4 (Week 1): Patient will complete grooming with set up assist OT Short Term Goal 4 - Progress (Week 1): Met OT Short Term Goal 5 (Week 1): Patient will transfer to shower bench/seat with mod assist OT Short Term Goal 5 - Progress (Week 1): Not met  Week 2:  OT Short Term Goal 1 (Week 2): Patient will complete LB dressing with moderate assistance OT Short Term Goal 2 (Week 2): Patient will transfer <> shower bench/seat with moderate assistance OT Short Term Goal 3 (Week 2): Patient will consistantlly perform sit<>stand with moderate assistance to help increase independence with ADLs in standing OT Short Term Goal 4 (Week 2): Patient will consistantly perform transfers with moderate assistance; squat pivot transfers OT Short Term Goal 5 (Week 2): Energy conservation techniques and importance of energy conservation will be discussed with patient  Skilled Therapeutic Interventions/Progress Updates:  Patient found seated in w/c with no complaints of pain, but with complaints of some shortness of breath; O2=98% on 1.5 liters of oxygen. Patient ambulated from sink side in w/c -> tub transfer bench in shower stall with minimal assistance from therapist (mod assist for sit<>stand and min assist for ambulation using rolling walker). Patient then performed UB/LB bathing at shower level,  standing to complete peri care. Once finished with the shower patient ambulated out of shower into w/c for UB/LB dressing in sit<>stand position; patient able to stand with mod assist and required steady assist once in standing to pull pants up to waist. Therapist donned bilateral TEDs and patient able to donn bilateral socks without use of sock aid. At end of session left patient seated in w/c with bilateral leg rests donned and call bell & phone within reach. 02 sats checked throughout session and when >90% encouraged pursed lip breathing and 02 increased to <90%. HR remained in the low 100's at rest and got up to 120's during activity; notified RN. Left patient on 1 liter of 02 through nasal cannula at end of session.   Precautions:  Precautions Precautions: Fall Precaution Comments: trying to wean O2, used 1 L today and stayed in the 90s Restrictions Weight Bearing Restrictions: No  See FIM for current functional status  Therapy/Group: Individual Therapy  Kingson Lohmeyer 10/06/2012, 10:01 AM

## 2012-10-06 NOTE — Progress Notes (Signed)
Physical Therapy Session Note  Patient Details  Name: [name] MRN: [id number] Date of Birth: 08-07-1955  Today's Date: 10/06/2012 Time: 5621-3086 Time Calculation (min): 40 min  Short Term Goals: Week 2:  PT Short Term Goal 1 (Week 2): Pt will perform sit <> stand from wheelchair consistently with min assist PT Short Term Goal 2 (Week 2): Pt will ambulate 100' with min-guard assist and RW PT Short Term Goal 3 (Week 2): Pt will transfer bed <> wheelchair with min-guard assist PT Short Term Goal 4 (Week 2): Pt will tolerate 3 min continuous standing balance activity.  Skilled Therapeutic Interventions/Progress Updates:    Patient received sitting in wheelchair, no c/o pain, but reports she is very fatigued. [name] present for today's session. Today's session focused on stair negotiation, LE strengthening, and increasing activity tolerance. Patient performed 2 sets of step ups x2 with B railings ascending with R LE, descending backwards leading with L LE. Patient performed 1 set of step ups ascending with L LE, descending backwards leading with R LE. Patient performs activity with min-mod assist (mod assist required for ascending with L LE). Patient attempted to perform one more set of 2 step ups with L LE, however, L knee slightly buckles and patient requires mod assist to maintain balance. Patient reports she is too fatigued to perform any more step ups.  Seated in wheelchair, patient performed LE strengthening exercises: hip flexion, knee extension, ankle pumps, hip adduction with pillow x20 each. Patient returned to room and transferred to bed. SpO2 remains above 93% during entire session. Patient left supine in bed with all needs within reach and husband present.  Therapy Documentation Precautions:  Precautions Precautions: Fall Restrictions Weight Bearing Restrictions: No Pain: Pain Assessment Pain Assessment: No/denies pain Pain Score: 0-No pain Locomotion : Wheelchair  Mobility Distance: 175 x2   See FIM for current functional status  Therapy/Group: Individual Therapy  Chipper Herb. Ripa, PT, DPT  10/06/2012, 4:18 PM

## 2012-10-07 ENCOUNTER — Inpatient Hospital Stay (HOSPITAL_COMMUNITY): Payer: Managed Care, Other (non HMO) | Admitting: Occupational Therapy

## 2012-10-07 ENCOUNTER — Inpatient Hospital Stay (HOSPITAL_COMMUNITY): Payer: Managed Care, Other (non HMO)

## 2012-10-07 NOTE — Progress Notes (Signed)
Occupational Therapy Session Notes  Patient Details  Name: Tyeasha Ebbs MRN: [id number] Date of Birth: September 03, 1955  Today's Date: 10/07/2012  Short Term Goals: Week 1:  OT Short Term Goal 1 (Week 1): Patient will transfer to commode with mod assist OT Short Term Goal 1 - Progress (Week 1): Met OT Short Term Goal 2 (Week 1): Patient will complete lower body dressing with mod assist OT Short Term Goal 2 - Progress (Week 1): Not met OT Short Term Goal 3 (Week 1): Patient will complete bathing with mod assist OT Short Term Goal 3 - Progress (Week 1): Met OT Short Term Goal 4 (Week 1): Patient will complete grooming with set up assist OT Short Term Goal 4 - Progress (Week 1): Met OT Short Term Goal 5 (Week 1): Patient will transfer to shower bench/seat with mod assist OT Short Term Goal 5 - Progress (Week 1): Not met  Week 2:  OT Short Term Goal 1 (Week 2): Patient will complete LB dressing with moderate assistance OT Short Term Goal 2 (Week 2): Patient will transfer <> shower bench/seat with moderate assistance OT Short Term Goal 3 (Week 2): Patient will consistantlly perform sit<>stand with moderate assistance to help increase independence with ADLs in standing OT Short Term Goal 4 (Week 2): Patient will consistantly perform transfers with moderate assistance; squat pivot transfers OT Short Term Goal 5 (Week 2): Energy conservation techniques and importance of energy conservation will be discussed with patient  Skilled Therapeutic Interventions/Progress Updates:   Session #1 [id number] - 55 Minutes Individual Therapy No complaints of pain Patient found seated in w/c beside bed with husband present in room and 1 liter of 02 donned through nasal cannula. Patient ambulated from w/c at sink-> BSC seated over elevated toilet seat in bathroom. Patient also performed toileting (all with min/steady assist). Patient then ambulated -> tub transfer bench in walk-in shower using grab bars prn with minimal  assistance. UB/LB bathing performed with supervision in seated position. Patient then ambulated out of shower -> w/c for UB/LB dressing in sit->stand position with min assist for sit<>stands and steady assist while in standing position. Patient on room air during entire session and when 02 sats >90% patient took rest break and performed pursed lip breathing and 02 sats increased to <90%. Therapist left patient seated in w/c with nasal cannula donned and call bell & phone within reach. [name] present during entire session.   Session #2 [id number] - 25 Minutes Individual Therapy No complaints of pain Patient found seated in w/c upon entering room. Patient propelled self from therapy gym -> ADL apartment for focus on functional ambulation/mobility using rolling walker, simulated shower stall transfer on/off shower seat using shower block, furniture transfer, sit<>stands, dynamic standing balance/tolerance/endurance, and overall activity tolerance/endurance. Patient on 1 liter of 02 through nasal cannula during entire session. Discussed d/c plan with patient and goals; upgrading goals -> overall modified independent.   Precautions:  Precautions Precautions: Fall Precaution Comments: trying to wean O2, used 1 L today and stayed in the 90s Restrictions Weight Bearing Restrictions: No  See FIM for current functional status  CLAY,PATRICIA 10/07/2012, 7:31 AM

## 2012-10-07 NOTE — Progress Notes (Signed)
Occupational Therapy Note  Patient Details  Name: Jiayi Lengacher MRN: [id number] Date of Birth: 06-27-1956 Today's Date: 10/07/2012  Time: 1000-1045 Pt denies pain Individual Therapy  Pt in w/c with husband present.  Husband observed therapy session this morning.  Pt rolled to gym for dynamic standing tasks and funcitonal ambulation without AD.  Pt performed sit<>stand X 5 with steady assist only to engage in standing tasks requiring patient to toss and bounce ball to husband.  Pt amb with RW to gather towels from floor using reacher and also reaching to variety of heights to gather remainder of towels.  Pt amb with RW to place towels in dirty linen bag.  Pt amb approx 15' without AD (min A) X 2.  Pt O2 sats at 85% on RA with 30 second recovery on 1L O2.  Pt O2 sats >90% on 1L after activity.  HR<100 after activity.  Pt required multiple rest breaks throughout session.   Lavone Neri [facility] 10/07/2012, 12:03 PM

## 2012-10-07 NOTE — Progress Notes (Signed)
Social Work Patient ID: [name], female   DOB: [birth year], 57 y.o.   MRN: [id number]  Met with pt this afternoon to review team conference.  Pt very pleased with gains made this week and pleased with upgrade in several functional goals to mod i.  Hopeful she may be able to wean off of O2 prior to d/c.  No concerns at this time.  HOYLE, LUCY, LCSW

## 2012-10-07 NOTE — Progress Notes (Signed)
Patient ID: [name], female   DOB: [birth year], 57 y.o.   MRN: [id number] Subjective/Complaints:   No new issues. Getting stronger A 12 point review of systems has been performed and if not noted above is otherwise negative.   Objective: Vital Signs: Blood pressure 124/80, pulse 76, temperature 98.1 F (36.7 C), temperature source Oral, resp. rate 19, height 5\' 7"  (1.702 m), weight 102.5 kg (225 lb 15.5 oz), SpO2 100.00%. No results found.  Recent Labs  10/05/12 1055  WBC 6.0  HGB 10.3*  HCT 30.7*  PLT 266    Recent Labs  10/05/12 1055  NA 138  K 4.4  CL 104  GLUCOSE 102*  BUN 12  CREATININE 0.58  CALCIUM 9.2   CBG (last 3)  No results found for this basename: GLUCAP,  in the last 72 hours  Wt Readings from Last 3 Encounters:  10/01/12 102.5 kg (225 lb 15.5 oz)  09/23/12 113.399 kg (250 lb)  08/11/12 168.9 kg (372 lb 5.7 oz)    Physical Exam:  Constitutional: She is oriented to person, place, and time. She appears well-developed. obese  HENT:  Head: Normocephalic, oral mucosa pink and moist.  Eyes:  Pupils round and reactive to light  Neck: Normal range of motion. Neck supple. No thyromegaly present.  Cardiovascular: Normal rate and regular rhythm. No murmurs.  Pulmonary/Chest: Effort normal and breath sounds normal. No respiratory distress. She has no wheezes.  Abdominal: Soft. Bowel sounds are normal. She exhibits no distension. There is no tenderness.  Musculoskeletal:  Trace to 1+ edema lower extremities.  NEURO: Decreased sensation to light touch bilaterally at toes. UE 4+/5 deltoids, biceps, triceps, WE, HI. LE 3 HF, 4- KE, 4/5 ADF and APF. DTR'S are 1+.  She is alert and oriented to person, place, and time.  Follows full commands. Has excellent insight and awareness.  Skin: Skin is warm and dry.  Psychiatric: She has a normal mood and affect.    Assessment/Plan: 1. 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 Physiatrist is providing close team supervision and 24 hour management of active medical problems listed below. Physiatrist and rehab team continue to assess barriers to discharge/monitor patient progress toward functional and medical goals.    FIM: FIM - Bathing Bathing Steps Patient Completed: Chest;Right Arm;Left Arm;Abdomen;Front perineal area;Buttocks;Right upper leg;Left upper leg;Right lower leg (including foot);Left lower leg (including foot) Bathing: 5: Supervision: Safety issues/verbal cues (seated)  FIM - Upper Body Dressing/Undressing Upper body dressing/undressing steps patient completed: Thread/unthread right bra strap;Thread/unthread left bra strap;Hook/unhook bra;Thread/unthread right sleeve of pullover shirt/dresss;Thread/unthread left sleeve of pullover shirt/dress;Put head through opening of pull over shirt/dress;Pull shirt over trunk Upper body dressing/undressing: 5: Set-up assist to: Obtain clothing/put away FIM - Lower Body Dressing/Undressing Lower body dressing/undressing steps patient completed: Thread/unthread right underwear leg;Thread/unthread left underwear leg;Pull underwear up/down;Thread/unthread right pants leg;Thread/unthread left pants leg;Pull pants up/down;Don/Doff right sock;Don/Doff left sock Lower body dressing/undressing: 4: Min-Patient completed 75 plus % of tasks (when standing; supervision for sit<>stand)  FIM - Toileting Toileting steps completed by patient: Adjust clothing prior to toileting;Performs perineal hygiene;Adjust clothing after toileting Toileting Assistive Devices: Grab bar or rail for support Toileting: 4: Steadying assist  FIM - Diplomatic Services operational officer Devices: Bedside commode;Grab bars;Walker Toilet Transfers: 4-To toilet/BSC: Min A (steadying Pt. > 75%);4-From toilet/BSC: Min A (steadying  Pt. > 75%)  FIM -  Bed/Chair Transport planner Devices: Arm rests Bed/Chair Transfer: 5: Sit > Supine: Supervision (verbal cues/safety issues);3: Bed > Chair or W/C: Mod A (lift or lower assist);3: Chair or W/C > Bed: Mod A (lift or lower assist)  FIM - Locomotion: Wheelchair Distance: 175 Locomotion: Wheelchair: 5: Travels 150 ft or more: maneuvers on rugs and over door sills with supervision, cueing or coaxing FIM - Locomotion: Ambulation Locomotion: Ambulation Assistive Devices: Designer, industrial/product Ambulation/Gait Assistance: 4: Min guard Locomotion: Ambulation: 2: Travels 50 - 149 ft with minimal assistance (Pt.>75%)  Comprehension Comprehension Mode: Auditory Comprehension: 7-Follows complex conversation/direction: With no assist  Expression Expression Mode: Verbal Expression: 7-Expresses complex ideas: With no assist  Social Interaction Social Interaction: 7-Interacts appropriately with others - No medications needed.  Problem Solving Problem Solving: 7-Solves complex problems: Recognizes & self-corrects  Memory Memory: 7-Complete Independence: No helper  Medical Problem List and Plan:  1. 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 DVT Prophylaxis/Anticoagulation:   Venous dopplers show extensive bilateral femoral, popliteal DVT's. Xarelto initiated. All activities resumed yesterday without issue   3. Mood: Zoloft 25 mg daily. Provide emotional support and positive reinforcement . In good spirits considering the situation. 4. Neuropsych: This patient is capable of making decisions on her own behalf.  5. Pulmonary. Continue oxygen as needed at 1-2 L.  -It is possible that she may have embolized a small clot to her lungs    -continue weaning oxygen as tolerated 6. Acute renal failure-resolved.  BUN and CR wnl 7. Clostridium difficile diarrhea.resolved 8. Hypertension. Lasix 40 mg daily, hydralazine 25 mg 3 times daily.  Monitor with increased mobility. BP's beginning to run too low  -stop hydralazine LOS (Days) 13 A FACE TO FACE EVALUATION WAS PERFORMED  SWARTZ,ZACHARY T 10/07/2012 8:37 AM

## 2012-10-07 NOTE — Progress Notes (Signed)
Physical Therapy Session Note  Patient Details  Name: [name] MRN: [id number] Date of Birth: 1955-12-31  Today's Date: 10/07/2012 Time: 1300-1355 Time Calculation (min): 55 min  Short Term Goals: Week 1:  PT Short Term Goal 1 (Week 1): Pt will tolerate 2 min continuous standing activity with min assist (Downgraded 2/24 due to progress) PT Short Term Goal 1 - Progress (Week 1): Met PT Short Term Goal 2 (Week 1): Pt will propel wheelchair x 200' continuous with supervision PT Short Term Goal 2 - Progress (Week 1): Met PT Short Term Goal 3 (Week 1): Pt will perform bed > chair transfer squat pivot with min assist PT Short Term Goal 3 - Progress (Week 1): Partly met (surfaces must be level) PT Short Term Goal 4 (Week 1): Pt will perform sit <> stand from mat with min assist. (Goal updated due to progress.) PT Short Term Goal 4 - Progress (Week 1): Partly met (mat must be elevated)  Skilled Therapeutic Interventions/Progress Updates:    Treatment focused on activity tolerance/endurance, functional strengthening, HEP and gait with RW. Completed [place] HEP Level A including standing hip abduction, hamstring curls, seated LAQ, mini squats, and standing heel/toe raises 10 reps each bilaterally with seated rest breaks between exercises. Gait with RW x 40' x 2 with seated rest break and S, cues for posture. Sit to stands from S to min A during session and multiple rest breaks due to fatigue/low activity tolerance. Pt propelled w/c to/from therapy on unit for endurance/strengthening.   Therapy Documentation Precautions:  Precautions Precautions: Fall Precaution Comments: trying to wean O2, used 1 L today and stayed in the 90s Restrictions Weight Bearing Restrictions: No   Pain: Pain Assessment Pain Assessment: No/denies pain    Locomotion : Ambulation Ambulation/Gait Assistance: 5: Supervision   See FIM for current functional status  Therapy/Group: Individual Therapy  Karolee Stamps  [facility] 10/07/2012, 2:03 PM

## 2012-10-07 NOTE — Patient Care Conference (Signed)
Inpatient RehabilitationTeam Conference and Plan of Care Update Date: 10/07/2012   Time: 2:15 PM    Patient Name: [name]      Medical Record Number: [id number]  Date of Birth: [birth year] Sex: Female         Room/Bed: 4031/4031-01 Payor Info: Payor: [employer]  Plan: C[employer] MANAGED  Product Type: *No Product type*     Admitting Diagnosis: critical illness mypoathy h/o  Admit Date/Time:  09/24/2012  2:33 PM Admission Comments: No comment available   Primary Diagnosis:  Myopathy Principal Problem: Myopathy  Patient Active Problem List   Diagnosis Date Noted  . Vertigo 10/05/2012  . Myopathy 09/25/2012  . Chronic respiratory failure 09/01/2012  . 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 Acute respiratory failure with hypoxia 08/06/2012  . Influenza-like illness 08/06/2012  . CAP (community acquired pneumonia) 08/06/2012  . AKI (acute kidney injury) 08/06/2012  . Hypertension 08/06/2012    Expected Discharge Date: Expected Discharge Date: 10/15/12  Team Members Present: Physician leading conference: Dr. Faith Rogue Social Worker Present: Amada Jupiter, LCSW Nurse Present: Daryll Brod, RN PT Present: Karolee Stamps, PT OT Present: Mackie Pai, OT;Patricia Mat Carne, OT     Current Status/Progress Goal Weekly Team Focus  Medical   improving pulmonary stamina, denies pain  wean from oxygen prior to dc  see prior   Bowel/Bladder   Continent of bowel and bladder. LBM 10/06/12  Pt to remain continent of bowel and bladder  Monitor   Swallow/Nutrition/ Hydration             ADL's   overall supervision->min assist  currently supervision->min assist, plan to upgrade goals to overall mod I->set-up  ADL retraining, sit<>stands, dynamic standing balance/tolerance/endurance, overall activity tolerance/endurance, 02 support   Mobility   S to min A overall  upgraded to mod I overall; min A stairs  endurance, functional strengthening, gait training, sit to stands   Communication              Safety/Cognition/ Behavioral Observations            Pain   Oxy IR 5mg  q 4hrs prn  Pain to managed with prn med  Monitor for effectiveness   Skin   CDI  No additional skin breakdown  Routine turn q 2hrs    Rehab Goals Patient on target to meet rehab goals: Yes *See Interdisciplinary Assessment and Plan and progress notes for long and short-term goals  Barriers to Discharge: see prior    Possible Resolutions to Barriers:  no changes. still may need home o2    Discharge Planning/Teaching Needs:  Plan home with husband who can provide 24/7 assistance.  Sons also available to assist.      Team Discussion:  Upgrading goals!  Making excellent gains and remains very motivated.  Husband has been attending therapies.  No concerns  Revisions to Treatment Plan:  Several goals changed to mod i    Continued Need for Acute Rehabilitation Level of Care: The patient requires daily medical management by a physician with specialized training in physical medicine and rehabilitation for the following conditions: Daily direction of a multidisciplinary physical rehabilitation program to ensure safe treatment while eliciting the highest outcome that is of practical value to the patient.: Yes Daily medical management of patient stability for increased activity during participation in an intensive rehabilitation regime.: Yes Daily analysis of laboratory values and/or radiology reports with any subsequent need for medication adjustment of medical intervention for : Pulmonary problems (DVT)  HOYLE, LUCY 10/07/2012, 4:37  PM  

## 2012-10-08 ENCOUNTER — Inpatient Hospital Stay (HOSPITAL_COMMUNITY): Payer: Managed Care, Other (non HMO) | Admitting: Physical Therapy

## 2012-10-08 ENCOUNTER — Inpatient Hospital Stay (HOSPITAL_COMMUNITY): Payer: Managed Care, Other (non HMO) | Admitting: Occupational Therapy

## 2012-10-08 ENCOUNTER — Inpatient Hospital Stay (HOSPITAL_COMMUNITY): Payer: Managed Care, Other (non HMO) | Admitting: *Deleted

## 2012-10-08 ENCOUNTER — Inpatient Hospital Stay (HOSPITAL_COMMUNITY): Payer: Managed Care, Other (non HMO)

## 2012-10-08 DIAGNOSIS — J9600 Acute respiratory failure, unspecified whether with hypoxia or hypercapnia: Secondary | ICD-10-CM

## 2012-10-08 DIAGNOSIS — I80299 Phlebitis and thrombophlebitis of other deep vessels of unspecified lower extremity: Secondary | ICD-10-CM

## 2012-10-08 DIAGNOSIS — G7281 Critical illness myopathy: Secondary | ICD-10-CM

## 2012-10-08 NOTE — Progress Notes (Signed)
NUTRITION FOLLOW UP  DOCUMENTATION CODES  Per approved criteria   -Non-severe (moderate) malnutrition in the context of acute illness or injury  -Obesity Unspecified    Intervention:   1. Continue Ensure Complete po daily, each supplement provides 350 kcal and 13 grams of protein.  2. RD to continue to follow nutrition care plan  Nutrition Dx:   Unintentional wt loss related to acute illness and prolonged hospitalization as evidenced by wt loss of 10% x at least 2 months. Ongoing.  Goal:   Intake to meet >90% of estimated nutrition needs. Met.  Monitor:   weight trends, lab trends, I/O's, PO intake, supplement tolerance  Assessment:   Continues on Regular diet. Pt states that her appetite remains stable and she denies any specific questions/concerns at this time. Pleased with her progress.  Continues to work towards d/c date of 3/12. Continues with orders for Ensure Complete daily.   Height: Ht Readings from Last 1 Encounters:  09/24/12 5\' 7"  (1.702 m)    Weight Status:   Wt Readings from Last 1 Encounters:  10/01/12 225 lb 15.5 oz (102.5 kg)  No new weight.  Re-estimated needs:  Kcal: 2000 - 2200 Protein: 90 - 110 Fluid: 2  - 2.2  Skin: scratch marks  Diet Order: General   Intake/Output Summary (Last 24 hours) at 10/08/12 1148 Last data filed at 10/08/12 0900  Gross per 24 hour  Intake    600 ml  Output      0 ml  Net    600 ml    Last BM: 3/4   Labs:   Recent Labs Lab 10/05/12 1055  NA 138  K 4.4  CL 104  CO2 29  BUN 12  CREATININE 0.58  CALCIUM 9.2  GLUCOSE 102*    CBG (last 3)  No results found for this basename: GLUCAP,  in the last 72 hours  Scheduled Meds: . acidophilus  1 capsule Oral Daily  . antiseptic oral rinse  15 mL Mouth Rinse BID  . calcium carbonate  1 tablet Oral TID  . feeding supplement  237 mL Oral Q24H  . furosemide  40 mg Oral Daily  . metoprolol tartrate  37.5 mg Oral BID  . multivitamin with minerals  1 tablet  Oral Daily  . pantoprazole  40 mg Oral Daily  . potassium chloride  40 mEq Oral Daily  . [START ON 10/16/2012] rivaroxaban  20 mg Oral Daily  . rivaroxaban  15 mg Oral BID WC  . senna  1 tablet Oral BID  . sertraline  50 mg Oral QHS    Continuous Infusions:  none  Jarold Motto MS, RD, LDN Pager: [phone] After-hours pager: [phone]

## 2012-10-08 NOTE — Progress Notes (Signed)
Physical Therapy Note  Patient Details  Name: [name] MRN: [id number] Date of Birth: 01/17/1956 Today's Date: 10/08/2012  4098-1191 (55 minutes) individual Pain: no reported pain  Other: Oxygen sats (resting) 95% pulse 111 Focus of treatment : Therapeutic exercises focused on bilateral LE strengthening/ activity tolerance Treatment: wc mobility 120 feet X 2 SBA; sit to stand from wc min assist; Nustep Level 4 X 10 minutes with 2 brief rest breaks (Oxygen sats > 92% pulse 115); stepping up to 6 inch step X 12 with RW support (hip flexor strengthening); up/down 4 inch step 2 X 5  with RW support (quad strengthening); standing bilateral hip abduction/ hip extension X 10 with RW support the patient . Pt reports mild nausea/ nurse notified/ meds given.   4782-9562 (25 minutes) individual Pain: no reported pain/nausea Focus of treatment: Therapeutic activity focused on standing tolerance/balance Treatment : ([place] with RT)- Pt performed standing/ balance activity without AD using the Wii balance activity . Pt able to perform weight shifting with min assist for safety  approximately 5 minutes before seated rest break. Pt experienced instability Rt knee stepping backward.Pt completed second 5 minute session.   Jaine Estabrooks,JIM 10/08/2012, 9:08 AM

## 2012-10-08 NOTE — Progress Notes (Signed)
Patient ID: [name], female   DOB: 12-Feb-1956, 57 y.o.   MRN: [id number] Subjective/Complaints:   No new issues. Pleased with progress. A 12 point review of systems has been performed and if not noted above is otherwise negative.   Objective: Vital Signs: Blood pressure 121/79, pulse 83, temperature 98 F (36.7 C), temperature source Oral, resp. rate 19, height 5\' 7"  (1.702 m), weight 102.5 kg (225 lb 15.5 oz), SpO2 94.00%. No results found.  Recent Labs  10/05/12 1055  WBC 6.0  HGB 10.3*  HCT 30.7*  PLT 266    Recent Labs  10/05/12 1055  NA 138  K 4.4  CL 104  GLUCOSE 102*  BUN 12  CREATININE 0.58  CALCIUM 9.2   CBG (last 3)  No results found for this basename: GLUCAP,  in the last 72 hours  Wt Readings from Last 3 Encounters:  10/01/12 102.5 kg (225 lb 15.5 oz)  09/23/12 113.399 kg (250 lb)  08/11/12 168.9 kg (372 lb 5.7 oz)    Physical Exam:  Constitutional: She is oriented to person, place, and time. She appears well-developed. obese  HENT:  Head: Normocephalic, oral mucosa pink and moist.  Eyes:  Pupils round and reactive to light  Neck: Normal range of motion. Neck supple. No thyromegaly present.  Cardiovascular: Normal rate and regular rhythm. No murmurs.  Pulmonary/Chest: Effort normal and breath sounds normal. No respiratory distress. She has no wheezes.  Abdominal: Soft. Bowel sounds are normal. She exhibits no distension. There is no tenderness.  Musculoskeletal:  Trace to 1+ edema lower extremities.  NEURO: Decreased sensation to light touch bilaterally at toes. UE 4+/5 deltoids, biceps, triceps, WE, HI. LE 4 HF, 4+ KE, 4+/5 ADF and APF. DTR'S are 1+.  She is alert and oriented to person, place, and time.  Follows full commands. Has excellent insight and awareness.  Skin: Skin is warm and dry.  Psychiatric: She has a normal mood and affect.    Assessment/Plan: 1. 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 Physiatrist is providing close team supervision and 24 hour management of active medical problems listed below. Physiatrist and rehab team continue to assess barriers to discharge/monitor patient progress toward functional and medical goals.    FIM: FIM - Bathing Bathing Steps Patient Completed: Chest;Right Arm;Left Arm;Abdomen;Front perineal area;Buttocks;Right upper leg;Left upper leg;Right lower leg (including foot);Left lower leg (including foot) Bathing: 5: Supervision: Safety issues/verbal cues (seated)  FIM - Upper Body Dressing/Undressing Upper body dressing/undressing steps patient completed: Thread/unthread right bra strap;Thread/unthread left bra strap;Hook/unhook bra;Thread/unthread right sleeve of pullover shirt/dresss;Thread/unthread left sleeve of pullover shirt/dress;Put head through opening of pull over shirt/dress;Pull shirt over trunk Upper body dressing/undressing: 5: Set-up assist to: Obtain clothing/put away FIM - Lower Body Dressing/Undressing Lower body dressing/undressing steps patient completed: Thread/unthread right underwear leg;Thread/unthread left underwear leg;Pull underwear up/down;Thread/unthread right pants leg;Thread/unthread left pants leg;Pull pants up/down;Don/Doff right sock;Don/Doff left sock Lower body dressing/undressing: 4: Min-Patient completed 75 plus % of tasks (when standing; supervision for sit<>stand)  FIM - Toileting Toileting steps completed by patient: Adjust clothing prior to toileting;Performs perineal hygiene;Adjust clothing after toileting Toileting Assistive Devices: Grab bar or rail for support Toileting: 4: Steadying assist  FIM - Diplomatic Services operational officer Devices: Grab bars Toilet Transfers: 3-To toilet/BSC: Mod A (lift or lower assist);5-From toilet/BSC: Supervision (verbal cues/safety  issues)  FIM - Bed/Chair Transfer  Bed/Chair Transfer Assistive Devices: Arm rests Bed/Chair Transfer: 5: Sit > Supine: Supervision (verbal cues/safety issues);3: Bed > Chair or W/C: Mod A (lift or lower assist);3: Chair or W/C > Bed: Mod A (lift or lower assist)  FIM - Locomotion: Wheelchair Distance: 175 Locomotion: Wheelchair: 5: Travels 150 ft or more: maneuvers on rugs and over door sills with supervision, cueing or coaxing FIM - Locomotion: Ambulation Locomotion: Ambulation Assistive Devices: Designer, industrial/product Ambulation/Gait Assistance: 5: Supervision Locomotion: Ambulation: 1: Travels less than 50 ft with supervision/safety issues  Comprehension Comprehension Mode: Auditory Comprehension: 7-Follows complex conversation/direction: With no assist  Expression Expression Mode: Verbal Expression: 7-Expresses complex ideas: With no assist  Social Interaction Social Interaction: 7-Interacts appropriately with others - No medications needed.  Problem Solving Problem Solving: 7-Solves complex problems: Recognizes & self-corrects  Memory Memory: 7-Complete Independence: No helper  Medical Problem List and Plan:  1. 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 DVT Prophylaxis/Anticoagulation:   Venous dopplers show extensive bilateral femoral, popliteal DVT's. Xarelto initiated. All activities resumed yesterday without issue   3. Mood: Zoloft 25 mg daily. Provide emotional support and positive reinforcement . In good spirits considering the situation. 4. Neuropsych: This patient is capable of making decisions on her own behalf.  5. Pulmonary. Continue oxygen as needed at 1-2 L.  -It is possible that she may have embolized a small clot to her lungs    -continue weaning oxygen as tolerated 6. Acute renal failure-resolved.  BUN and CR wnl 7. Clostridium difficile diarrhea.resolved 8. Hypertension. Lasix 40 mg daily, hydralazine stopped. LOS (Days) 14 A  FACE TO FACE EVALUATION WAS PERFORMED  Oneita Allmon T 10/08/2012 7:53 AM

## 2012-10-08 NOTE — Progress Notes (Signed)
Occupational Therapy Note  Patient Details  Name: [name] MRN: [id number] Date of Birth: 1956/05/18 Today's Date: 10/08/2012  Time: 11am-11:45am ( .) Pt seen for 1:1 OT session with COTA focusing on functional mobility, transfers, standing/activity tolerance. Pt in bed upon arrival, stating she had gotten nauseous during last therapy session, but was feeling better after laying down for a while. No pain reported during session, although pt did have knee buckling in RLE during standing activity. Pt transferred into wheelchair and needed to use bathroom. Transfer onto commode with verbal cues for hand placement and multiple attempts. Pt self corrects during transfer and knew to sit and breathe before attempting again. Pt completed standing tolerance activity standing from wheelchair at raised table to place clothespins on checkerboard. Pt able to stand 3 separate times for 3-28min each. R knee began to buckle during 2nd trial standing, with pt needing to sit to rest. 1L O2 donned for entire session as pt was not feeling well and preferred keeping it on. Pt completed 5-6 sit <> stands during session with min-mod A for each.  Cristen Hessie Diener 10/08/2012, 11:56 AM

## 2012-10-08 NOTE — Evaluation (Signed)
Recreational Therapy Assessment and Plan  Patient Details  Name: Batsheva Stevick MRN: [id number] Date of Birth: [birth year] Today's Date: 10/08/2012  Rehab Potential: Good ELOS: 10 days   Assessment Clinical Impression:Problem List:  Patient Active Problem List   Diagnosis   .  Acute respiratory failure with hypoxia   .  Influenza-like illness   .  CAP (community acquired pneumonia)   .  AKI (acute kidney injury)   .  Hypertension   .  Chronic respiratory failure   .  Tracheostomy status   .  Myopathy    Past Medical History:  Past Medical History   Diagnosis  Date   .  Hypertension     Past Surgical History:  Past Surgical History   Procedure  Laterality  Date   .  Cyst excision   1980     Spinal cyst   .  Tonsillectomy   1980    Assessment & Plan  Clinical Impression: 57 year old right-handed white female with history of hypertension admitted to [facility] 08/05/2012 with hypoxemic respiratory failure presumably from H1N1 pneumonia. Patient noted fever, chills and nausea with decreased by mouth intake as well as increasing shortness of breath. She had recently been placed on Tamiflu x5 days but not tested for influenza prior to admission. In the emergency department started on BiPAP. Chest x-ray with diffuse bilateral airspace disease/consolidation right upper lobe without effusion. Placed on broad-spectrum antibiotics. Noted acute renal failure creatinine 2.04 and placed on gentle IV fluids. Renal ultrasound negative for hydronephrosis. She required intubation on 08/08/2011 VDRL suspect septic shock and steroid protocol initiated. Tracheostomy performed for prolonged ventilatory support and de cannulated 09/19/2012 without difficulty followed by critical care medicine. A nasogastric tube remained in place for nutritional support which was later removed and diet advanced to a regular consistency. She was transferred to [facility] for [facility] evaluation 08/11/2012 and not  felt to be required but did receive hemodialysis for a short time while at [facility]. [facility]. Creatinine continued to improve with latest creatinine 0.41. 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 Patient transferred to [facility] on 09/24/2012.   Pt presents with decreased activity tolerance, decreased functional mobility, decreased balance Limiting pt's independence with leisure/community pursuits.   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 - Travel (Comment) (taking care of 3 yo grandson who lives with her) Other Leisure Interests: Reading Leisure Participation Style: With Family/Friends;Alone Awareness of Community Resources: Good-identify 3 post discharge leisure resources Psychosocial / Spiritual Social interaction - Mood/Behavior: Cooperative Firefighter Appropriate for Education?: Yes Patient Agreeable to [employer]?: Yes Recreational Therapy Orientation Orientation -Reviewed with patient: Available activity resources Strengths/Weaknesses Patient Strengths/Abilities: Willingness to participate;Active premorbidly Patient weaknesses: Physical limitations  Plan Rec Therapy Plan Is patient appropriate for Therapeutic Recreation?: Yes Rehab Potential: Good Treatment times per week: Min 1 time for community reintegration Estimated Length of Stay: 10 days TR Treatment/Interventions: Adaptive equipment instruction;1:1 session;Balance/vestibular training;Functional mobility training;Community reintegration;Patient/family education;Therapeutic activities;Recreation/leisure participation;Therapeutic exercise;Wheelchair propulsion/positioning  Recommendations for other services: None  Discharge Criteria: Patient will be discharged from TR if  patient refuses treatment 3 consecutive times without medical reason.  If treatment goals not met, if there is a change in medical status, if patient makes no progress towards goals or if patient is discharged from hospital.  The above assessment, treatment plan, treatment alternatives and goals were  discussed and mutually agreed upon: by patient  SIMPSON,LISA 10/08/2012, 3:33 PM

## 2012-10-08 NOTE — Progress Notes (Signed)
Recreational Therapy Session Note  Patient Details  Name: [name] MRN: [id number] Date of Birth: 1955-10-18 Today's Date: 10/08/2012 Time:  1600-1630 Pain: no c/o Skilled Therapeutic Interventions/Progress Updates: Session focused on dynamic standing balance & activity tolerance while playing Wii balance game- 1 liter of o2..  Pt required min assist for balance during activity ~5 minutes.  Pt with LOB when stepping backward off of balance board requiring mod assist to recover.  Pt stood 5 minutes x2 for activity. Pt propelled w/c on unit using BUE's with supervision, +1 to manage o2 tank.   Therapy/Group: Co-Treatment   SIMPSON,LISA 10/08/2012, 5:17 PM

## 2012-10-08 NOTE — Progress Notes (Signed)
Occupational Therapy Session Note  Patient Details  Name: Caileen Veracruz MRN: [id number] Date of Birth: 1955-11-20  Today's Date: 10/08/2012 Time: 4098-1191 Time Calculation (min): 55 min  Short Term Goals: Week 1:  OT Short Term Goal 1 (Week 1): Patient will transfer to commode with mod assist OT Short Term Goal 1 - Progress (Week 1): Met OT Short Term Goal 2 (Week 1): Patient will complete lower body dressing with mod assist OT Short Term Goal 2 - Progress (Week 1): Not met OT Short Term Goal 3 (Week 1): Patient will complete bathing with mod assist OT Short Term Goal 3 - Progress (Week 1): Met OT Short Term Goal 4 (Week 1): Patient will complete grooming with set up assist OT Short Term Goal 4 - Progress (Week 1): Met OT Short Term Goal 5 (Week 1): Patient will transfer to shower bench/seat with mod assist OT Short Term Goal 5 - Progress (Week 1): Not met  Week 2:  OT Short Term Goal 1 (Week 2): Patient will complete LB dressing with moderate assistance OT Short Term Goal 2 (Week 2): Patient will transfer <> shower bench/seat with moderate assistance OT Short Term Goal 3 (Week 2): Patient will consistantlly perform sit<>stand with moderate assistance to help increase independence with ADLs in standing OT Short Term Goal 4 (Week 2): Patient will consistantly perform transfers with moderate assistance; squat pivot transfers OT Short Term Goal 5 (Week 2): Energy conservation techniques and importance of energy conservation will be discussed with patient  Skilled Therapeutic Interventions/Progress Updates:  Patient found seated in w/c upon entering room. Patient engaged in sit->stand from w/c and patient then ambulated into bathroom for shower stall transfer onto tub transfer bench. Patient then engaged in UB/LB bathing in seated position with supervision. After shower patient ambulated back to w/c for UB/LB dressing. Patient completed ADL on room air, 02 sats checked throughout session and when  02 was >90% patient would take breaks and perform pursed lip breathing. During ADL focused skilled intervention on sit<>stands, dynamic standing balance/tolerance/endurance, UB/LB bathing & dressing, shower stall transfer, and overall activity tolerance/endurance. After ADL patient propelled self -> therapy gym for 20 minutes on SCIFIT machine focusing on UE strengthening and overall activity tolerance/endurance. Patient continued to be on room air during exercise and sats remained <90% with rest breaks and pursed lip breathing. Therapist propelled patient back to room and left seated in w/c with call bell, phone and breakfast tray within reach. Patient left on room air and therapist notified RN.   Precautions:  Precautions Precautions: Fall Precaution Comments: trying to wean O2, used 1 L today and stayed in the 90s Restrictions Weight Bearing Restrictions: No  See FIM for current functional status  Therapy/Group: Individual Therapy  CLAY,Harmon Bommarito 10/08/2012, 8:37 AM

## 2012-10-09 ENCOUNTER — Inpatient Hospital Stay (HOSPITAL_COMMUNITY): Payer: Managed Care, Other (non HMO)

## 2012-10-09 ENCOUNTER — Inpatient Hospital Stay (HOSPITAL_COMMUNITY): Payer: Managed Care, Other (non HMO) | Admitting: Physical Therapy

## 2012-10-09 ENCOUNTER — Inpatient Hospital Stay (HOSPITAL_COMMUNITY): Payer: Managed Care, Other (non HMO) | Admitting: Occupational Therapy

## 2012-10-09 ENCOUNTER — Inpatient Hospital Stay (HOSPITAL_COMMUNITY): Payer: Managed Care, Other (non HMO) | Admitting: *Deleted

## 2012-10-09 MED ORDER — DIPHENOXYLATE-ATROPINE 2.5-0.025 MG PO TABS
2.0000 | ORAL_TABLET | Freq: Four times a day (QID) | ORAL | Status: DC | PRN
  Administered 2012-10-09 – 2012-10-11 (×3): 2 via ORAL
  Filled 2012-10-09 (×3): qty 2

## 2012-10-09 MED ORDER — FUROSEMIDE 20 MG PO TABS
20.0000 mg | ORAL_TABLET | Freq: Every day | ORAL | Status: DC
  Administered 2012-10-09 – 2012-10-14 (×6): 20 mg via ORAL
  Filled 2012-10-09 (×8): qty 1

## 2012-10-09 NOTE — Progress Notes (Signed)
Physical Therapy Session Note  Patient Details  Name: [name] MRN: [id number] Date of Birth: 1956/07/21  Today's Date: 10/09/2012 Time: 1030-1055 Time Calculation (min): 25 min  Short Term Goals: Week 2:  PT Short Term Goal 1 (Week 2): Pt will perform sit <> stand from wheelchair consistently with min assist PT Short Term Goal 2 (Week 2): Pt will ambulate 100' with min-guard assist and RW PT Short Term Goal 3 (Week 2): Pt will transfer bed <> wheelchair with min-guard assist PT Short Term Goal 4 (Week 2): Pt will tolerate 3 min continuous standing balance activity.  Skilled Therapeutic Interventions/Progress Updates:   w/c propulsion on unit for endurance and strengthening mod I to/from therapy. Stair training for home entry; started with bilateral rails and shorter 4" steps to introduce technique to pt and husband. Pt completed x 2 reps with overall min A; attempted with rail on R using bilateral UE's but pt not comfortable and wanted to try again with both rails this time. Education and demonstration of technique with 1 rail to both pt and husband; pt will need to continue to focus on using only R rail and higher steps for home entry.  Pt missed a few minutes of therapy session due to [facility]; husband assisted pt with toilet transfers. Husband notified RN of pt complaints of increased BM/diarhhea and nausea episodes the last few days.   Therapy Documentation Precautions:  Precautions Precautions: Fall Precaution Comments: trying to wean O2, used 1 L today and stayed in the 90s Restrictions Weight Bearing Restrictions: No Pain:  Reports R knee is locking at times this AM. Monitored during session with no episodes.  See FIM for current functional status  Therapy/Group: Individual Therapy  Karolee Stamps [facility] 10/09/2012, 12:08 PM

## 2012-10-09 NOTE — Progress Notes (Signed)
Patient ID: [name], female   DOB: 14-Sep-1955, 57 y.o.   MRN: [id number] Subjective/Complaints:   No new issues. Up in bathroom already. A 12 point review of systems has been performed and if not noted above is otherwise negative.   Objective: Vital Signs: Blood pressure 86/52, pulse 86, temperature 98 F (36.7 C), temperature source Oral, resp. rate 19, height 5\' 7"  (1.702 m), weight 102.5 kg (225 lb 15.5 oz), SpO2 96.00%. No results found. No results found for this basename: WBC, HGB, HCT, PLT,  in the last 72 hours No results found for this basename: NA, K, CL, CO, GLUCOSE, BUN, CREATININE, CALCIUM,  in the last 72 hours CBG (last 3)  No results found for this basename: GLUCAP,  in the last 72 hours  Wt Readings from Last 3 Encounters:  10/01/12 102.5 kg (225 lb 15.5 oz)  09/23/12 113.399 kg (250 lb)  08/11/12 168.9 kg (372 lb 5.7 oz)    Physical Exam:  Constitutional: She is oriented to person, place, and time. She appears well-developed. obese  HENT:  Head: Normocephalic, oral mucosa pink and moist.  Eyes:  Pupils round and reactive to light  Neck: Normal range of motion. Neck supple. No thyromegaly present.  Cardiovascular: Normal rate and regular rhythm. No murmurs.  Pulmonary/Chest: Effort normal and breath sounds normal. No respiratory distress. She has no wheezes.  Abdominal: Soft. Bowel sounds are normal. She exhibits no distension. There is no tenderness.  Musculoskeletal:  Trace to 1+ edema lower extremities.  NEURO: Decreased sensation to light touch bilaterally at toes. UE 4+/5 deltoids, biceps, triceps, WE, HI. LE 4 HF, 4+ KE, 4+/5 ADF and APF. DTR'S are 1+.  She is alert and oriented to person, place, and time.  Follows full commands. Has excellent insight and awareness.  Skin: Skin is warm and dry.  Psychiatric: She has a normal mood and affect.    Assessment/Plan: 1. 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 Physiatrist is providing close team supervision and 24 hour management of active medical problems listed below. Physiatrist and rehab team continue to assess barriers to discharge/monitor patient progress toward functional and medical goals.    FIM: FIM - Bathing Bathing Steps Patient Completed: Chest;Right Arm;Left Arm;Abdomen;Front perineal area;Buttocks;Right upper leg;Left upper leg;Right lower leg (including foot);Left lower leg (including foot) Bathing: 0: Activity did not occur  FIM - Upper Body Dressing/Undressing Upper body dressing/undressing steps patient completed: Thread/unthread right bra strap;Thread/unthread left bra strap;Hook/unhook bra;Thread/unthread right sleeve of pullover shirt/dresss;Thread/unthread left sleeve of pullover shirt/dress;Put head through opening of pull over shirt/dress;Pull shirt over trunk Upper body dressing/undressing: 0: Activity did not occur FIM - Lower Body Dressing/Undressing Lower body dressing/undressing steps patient completed: Thread/unthread right underwear leg;Thread/unthread left underwear leg;Pull underwear up/down;Thread/unthread right pants leg;Thread/unthread left pants leg;Pull pants up/down Lower body dressing/undressing: 0: Activity did not occur  FIM - Toileting Toileting steps completed by patient: Adjust clothing prior to toileting;Performs perineal hygiene;Adjust clothing after toileting Toileting Assistive Devices: Grab bar or rail for support Toileting: 4: Steadying assist  FIM - Diplomatic Services operational officer Devices: Grab bars Toilet Transfers: 0-Activity did not occur  FIM - Banker Devices: Arm rests Bed/Chair Transfer: 5: Supine > Sit: Supervision (verbal cues/safety issues);5: Bed > Chair or W/C: Supervision (verbal cues/safety issues) (pt  scooted from EOB <>wc)  FIM - Locomotion: Wheelchair Distance:  175 Locomotion: Wheelchair: 5: Travels 150 ft or more: maneuvers on rugs and over door sills with supervision, cueing or coaxing FIM - Locomotion: Ambulation Locomotion: Ambulation Assistive Devices: Designer, industrial/product Ambulation/Gait Assistance: 5: Supervision Locomotion: Ambulation: 1: Travels less than 50 ft with supervision/safety issues  Comprehension Comprehension Mode: Auditory Comprehension: 7-Follows complex conversation/direction: With no assist  Expression Expression Mode: Verbal Expression: 7-Expresses complex ideas: With no assist  Social Interaction Social Interaction: 7-Interacts appropriately with others - No medications needed.  Problem Solving Problem Solving: 7-Solves complex problems: Recognizes & self-corrects  Memory Memory: 7-Complete Independence: No helper  Medical Problem List and Plan:  1. 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 DVT Prophylaxis/Anticoagulation:   Venous dopplers show extensive bilateral femoral, popliteal DVT's. Xarelto initiated. All activities resumed yesterday without issue   3. Mood: Zoloft 25 mg daily. Provide emotional support and positive reinforcement . In good spirits considering the situation. 4. Neuropsych: This patient is capable of making decisions on her own behalf.  5. Pulmonary. Continue oxygen as needed at 1-2 L.  -It is possible that she may have embolized a small clot to her lungs    -continue weaning oxygen as tolerated 6. Acute renal failure-resolved.  BUN and CR wnl 7. Clostridium difficile diarrhea.resolved 8. Hypertension. Lasix 40 mg daily, hydralazine stopped. bp normotensive to hypotensive at times.   -will cut lasix to 20mg  LOS (Days) 15 A FACE TO FACE EVALUATION WAS PERFORMED  SWARTZ,ZACHARY T 10/09/2012 7:45 AM

## 2012-10-09 NOTE — Progress Notes (Signed)
Occupational Therapy Session Note  Patient Details  Name: [name] MRN: [id number] Date of Birth: 02-Jan-1956  Today's Date: 10/09/2012 Time: 4098-1191 Time Calculation (min): 55 min  Short Term Goals: Week 1:  OT Short Term Goal 1 (Week 1): Patient will transfer to commode with mod assist OT Short Term Goal 1 - Progress (Week 1): Met OT Short Term Goal 2 (Week 1): Patient will complete lower body dressing with mod assist OT Short Term Goal 2 - Progress (Week 1): Not met OT Short Term Goal 3 (Week 1): Patient will complete bathing with mod assist OT Short Term Goal 3 - Progress (Week 1): Met OT Short Term Goal 4 (Week 1): Patient will complete grooming with set up assist OT Short Term Goal 4 - Progress (Week 1): Met OT Short Term Goal 5 (Week 1): Patient will transfer to shower bench/seat with mod assist OT Short Term Goal 5 - Progress (Week 1): Not met  Week 2:  OT Short Term Goal 1 (Week 2): Patient will complete LB dressing with moderate assistance OT Short Term Goal 2 (Week 2): Patient will transfer <> shower bench/seat with moderate assistance OT Short Term Goal 3 (Week 2): Patient will consistantlly perform sit<>stand with moderate assistance to help increase independence with ADLs in standing OT Short Term Goal 4 (Week 2): Patient will consistantly perform transfers with moderate assistance; squat pivot transfers OT Short Term Goal 5 (Week 2): Energy conservation techniques and importance of energy conservation will be discussed with patient  Skilled Therapeutic Interventions/Progress Updates:  Patient found seated in w/c eating breakfast with complaints of pain in bilateral knees, no rate given-RN aware. Patient then ambulated into bathroom with close sueprvision for toilet transfer, then shower stall transfer. Patient then engaged in UB/LB bathing at shower level. Patient ambulated out of bathroom -> w/c with supervision for UB/LB dressing with set-up/supervision. After ADL  patient propelled self -> therapy gym and therapist administered elastic shoe strings to increase patients independence with donning/doffing bilateral shoes. 02 sats checked throughout session and when 02 = >90% patient took rest breaks and performed pursed lip breathing. Patient on room air entire session. Left patient seated in w/c in gym with husband present for next therapy session.   Precautions:  Precautions Precautions: Fall Precaution Comments: trying to wean O2, used 1 L today and stayed in the 90s Restrictions Weight Bearing Restrictions: No  See FIM for current functional status  Therapy/Group: Individual Therapy  CLAY,PATRICIA 10/09/2012, 8:36 AM

## 2012-10-09 NOTE — Progress Notes (Signed)
10/09/12 0626  Vitals  BP ! 86/52 mmHg   Harvel Ricks, PA.  was made aware of patient low blood pressure this morning. No order was given

## 2012-10-09 NOTE — Progress Notes (Signed)
Physical Therapy Session Note  Patient Details  Name: [name] MRN: [id number] Date of Birth: [birth year]  Today's Date: 10/09/2012 Time: 9629-5284 Time Calculation (min): 58 min  Short Term Goals: Week 2:  PT Short Term Goal 1 (Week 2): Pt will perform sit <> stand from wheelchair consistently with min assist PT Short Term Goal 2 (Week 2): Pt will ambulate 100' with min-guard assist and RW PT Short Term Goal 3 (Week 2): Pt will transfer bed <> wheelchair with min-guard assist PT Short Term Goal 4 (Week 2): Pt will tolerate 3 min continuous standing balance activity.  Skilled Therapeutic Interventions/Progress Updates:    Discussed home situation and expected level at D/C. Measured pt for equipment. Anticipate pt will benefit from a RW for ambulation, 20" x 18" wheelchair for long distances due to continued deconditioning, and a hospital bed as she will likely not be able to perform 15 consecutive steps without seated rest to reach bed. Social worker made aware.   Bed > 3n1 commode stand pivot with RW and supervision, cues for safety as she was moving quickly secondary to urgency. Standing endurance activity watering plants x 4 bouts. Also worked on side stepping with RW over carpet with close supervision. Pt very fatigued secondary to nausea and diarrhea and required wheelchair to be near by as her legs got weak very fast.    Weekly note due tomorrow, will assess short term goals tomorrow with hopes that she is feeling better.  Therapy Documentation Precautions:  Precautions Precautions: Fall Precaution Comments: trying to wean O2 Restrictions Weight Bearing Restrictions: No Pain: Pain Assessment Pain Assessment: No/denies pain   See FIM for current functional status  Therapy/Group: Individual Therapy  [name] 10/09/2012, 3:53 PM

## 2012-10-09 NOTE — Progress Notes (Signed)
Recreational Therapy Session Note  Patient Details  Name: Royal Beirne MRN: [id number] Date of Birth: 1956-04-25 Today's Date: 10/09/2012 Time:  907-935 Pain: no c/o  Skilled Therapeutic Interventions/Progress Updates: pt received seated in w/c in gym without o2.  Sats 93% on RA.  Extensive discussion with pt & pt's husband about community reintegration/outing and potential for it to be scheduled on Monday for lunch.  Discussion included potential goals & obstacles that may be encountered.  Pt agreeable to participate.  Will plan outing for Monday.  Therapy/Group: Individual Therapy  Yamili Lichtenwalner 10/09/2012, 12:13 PM

## 2012-10-09 NOTE — Progress Notes (Signed)
Social Work Patient ID: [name], female   DOB: November 14, 1955, 57 y.o.   MRN: [id number]  Received notification from insurance CM yesterday that pt's continued stay on CIR is approved through to d/c on 10/15/12.  HOYLE, LUCY, LCSW

## 2012-10-09 NOTE — Progress Notes (Signed)
Occupational Therapy Session Note  Patient Details  Name: [name] MRN: [id number] Date of Birth: Feb 11, 1956  Today's Date: 10/09/2012 Time: 4098-1191 Time Calculation (min): 46 min  Skilled Therapeutic Interventions/Progress Updates:    Pt worked on bilateral UE strengthening using 1lb wrist weights on each arm while working on ball catch and toss.  Pt able to perform 4 intervals lasting 1-2 mins each.  Transitioned to theraband exercises for shoulder extension and scapular retraction for 4 sets of 10 repetitions.  Finished with work on the [employer] for 2 sets of 4 mins.  RPMs maintained at 25-30 during intervals with resistance set at level 10.  Performed 1 set forward peddling and 1 set in reverse.  O2 sats checked at 87-88% post exercise increasing to 92% after 1 minute of purse lip breathing.  Pt propelled wheelchair back to the room on her own.  Therapy Documentation Precautions:  Precautions Precautions: Fall Precaution Comments: trying to wean O2, used 1 L today and stayed in the 90s Restrictions Weight Bearing Restrictions: No  Vital Signs: Therapy Vitals Pulse Rate: 98 Patient Position, if appropriate: Sitting Oxygen Therapy SpO2: 91 % O2 Device: Nasal cannula Pulse Oximetry Type: Intermittent Pain: Pain Assessment Pain Assessment: No/denies pain  See FIM for current functional status  Therapy/Group: Individual Therapy  MCGUIRE,JAMES OTR/L 10/09/2012, 4:11 PM

## 2012-10-10 ENCOUNTER — Inpatient Hospital Stay (HOSPITAL_COMMUNITY): Payer: Managed Care, Other (non HMO) | Admitting: Physical Therapy

## 2012-10-10 ENCOUNTER — Inpatient Hospital Stay (HOSPITAL_COMMUNITY): Payer: Managed Care, Other (non HMO) | Admitting: Occupational Therapy

## 2012-10-10 ENCOUNTER — Inpatient Hospital Stay (HOSPITAL_COMMUNITY): Payer: Managed Care, Other (non HMO)

## 2012-10-10 NOTE — Progress Notes (Signed)
Physical Therapy Session Note  Patient Details  Name: [name] MRN: [id number] Date of Birth: September 02, 1955  Today's Date: 10/10/2012 Time: 7829-5621 Time Calculation (min): 40 min  Short Term Goals: Week 3:   = long term goals  Skilled Therapeutic Interventions/Progress Updates:    Pt ambulated from room with goal to make it to nurses station, she was able to walk all the way to the gym with only one seated rest (121', 90') with RW and min-guard assist, SpO2 down to 84% on 0.5 L O2 which returned to >93% on 0.5 L O2 with seated rest. As session continued pt needed 1L of O2 to return to >92% SpO2 likely due to fatigue. Also practiced picking up horseshoes from variety of levels focusing on safe positioning and reaching while using RW, overall steady assist.   Pt exercised on the NuStep level 5 for 2 x 5 min and extended rest in between. Emphasized use of bil. LEs, pt able to wean off of overusing bil. UE.   Therapy Documentation Precautions:  Precautions Precautions: Fall Precaution Comments: trying to wean O2 Restrictions Weight Bearing Restrictions: No Pain: Pain Assessment Pain Assessment: No/denies pain  See FIM for current functional status  Therapy/Group: Individual Therapy  [name] 10/10/2012, 3:42 PM

## 2012-10-10 NOTE — Progress Notes (Signed)
Occupational Therapy Note  Patient Details  Name: [name] MRN: [id number] Date of Birth: Dec 23, 1955 Today's Date: 10/10/2012  Time: 1130-1200 Pt denies pain Individual Therapy Pt in w/c upon arrival with husband at side.  Husband observed therapy session.  Pt engaged in dynamic standing tasks bouncing 2 and 3 kg weighted ball against mini trampoline.  Pt completed 2 sets X 20 without use of RW and 2 sets of 20 while standing on foam mat.  Pt O2 sats at 85% on 1L RA after activity and recovered to 90% within 45 secs.  Focus on activity tolerance, dynamic standing balance, and safety awareness.   Lavone Neri [facility] 10/10/2012, 12:14 PM

## 2012-10-10 NOTE — Progress Notes (Signed)
Physical Therapy Weekly Progress Note  Patient Details  Name: [name] MRN: [id number] Date of Birth: 08/18/1955  Today's Date: 10/10/2012 Time: 0865-7846 Time Calculation (min): 63 min  Patient has met 4 of 4 short term goals.  She has made excellent progress this week despite having set backs from illness. She is very motivated and works very hard with therapies. She is now able to stand from lower surfaces and wheelchair consistently with min-guard/supervision assist. She continues with significantly impaired endurance and decreased cardiorespiratory support during activity and has not been able to fully wean from supplemental oxygen during activity.  Patient continues to demonstrate the following deficits: decreased endurance, generalized weakness, impaired dynamic balance, decreased functional mobility and therefore will continue to benefit from skilled PT intervention to enhance overall performance with activity tolerance and balance.  Patient progressing toward long term goals..  Continue plan of care.  PT Short Term Goals Week 2:  PT Short Term Goal 1 (Week 2): Pt will perform sit <> stand from wheelchair consistently with min assist PT Short Term Goal 1 - Progress (Week 2): Met PT Short Term Goal 2 (Week 2): Pt will ambulate 100' with min-guard assist and RW PT Short Term Goal 2 - Progress (Week 2): Met PT Short Term Goal 3 (Week 2): Pt will transfer bed <> wheelchair with min-guard assist PT Short Term Goal 3 - Progress (Week 2): Met PT Short Term Goal 4 (Week 2): Pt will tolerate 3 min continuous standing balance activity. PT Short Term Goal 4 - Progress (Week 2): Met  Skilled Therapeutic Interventions/Progress Updates:      Performed 6" steps with one railing with mod assist for first 2 steps, min assist for next 2 steps after seated rest. Pt performed sideways to simulate home entry.   Ambulation x 107', 113' with RW and min-guard assist without supplemental O2, SpO2 down to  88% but returns to 94% on room air.   Squats + weighted ball toss for strengthening and endurance x 2 bouts to fatigue. SpO2 down to 83% on room air, pt required 0.5L [place] O2 to return >90%. Standing balance on compliant surface without bil. UE support x 2 bouts, increased difficulty by adding soccer ball toss.   Therapy Documentation Precautions:  Precautions Precautions: Fall Precaution Comments: trying to wean O2, used 1 L today and stayed in the 90s Restrictions Weight Bearing Restrictions: No Pain: Pain Assessment Pain Assessment: No/denies pain See FIM for current functional status  Therapy/Group: Individual Therapy  [name] 10/10/2012, 11:48 AM

## 2012-10-10 NOTE — Progress Notes (Signed)
Occupational Therapy Session Note  Patient Details  Name: Fusako Tanabe MRN: [id number] Date of Birth: 07/08/1956  Today's Date: 10/10/2012 Time: 0904-1004 Time Calculation (min): 60 min  Skilled Therapeutic Interventions/Progress Updates:    Pt propelled her wheelchair to the therapy gym independently on room air.  O2 sats 91% at completion of this.  Worked on sit to stand and standing endurance while incorporating the WII balance board.  Pt able to maintain standing with close supervision statically for intervals of 2 mins to 3 mins 20 seconds while using balance board.  Pt with some increased fear of weightshifting forward in standing.  O2 sats still decreasing to the mid to upper 80's with activity on room air.  Needed 2-3 minute rest breaks between each interval with min to mod assist needed to stand from sitting position.  Also incorporated boxing game in standing as well to work on UE strengthening as well as balance and endurance.  HR increased to 135 after standing for over 3 mins with O2 sats decreasing to 78 upon sitting.  Placed 3 Ls nasal cannula with sats increasing to 93% in less than one minute.    Therapy Documentation Precautions:  Precautions Precautions: Fall Precaution Comments: trying to wean O2, used 1 L today and stayed in the 90s Restrictions Weight Bearing Restrictions: No  Vital Signs: Therapy Vitals Pulse Rate: 75 Patient Position, if appropriate: Sitting Oxygen Therapy SpO2: 96 % O2 Device: Nasal cannula O2 Flow Rate (L/min): 1 L/min Pain: Pain Assessment Pain Assessment: No/denies pain ADL: See FIM for current functional status  Therapy/Group: Individual Therapy  MCGUIRE,JAMES OTR/L 10/10/2012, 11:28 AM

## 2012-10-10 NOTE — Progress Notes (Signed)
Patient ID: [name], female   DOB: 1955/08/28, 57 y.o.   MRN: [id number] Subjective/Complaints:   Progressing with therapies. No new complaints. Stamina increasing A 12 point review of systems has been performed and if not noted above is otherwise negative.   Objective: Vital Signs: Blood pressure 100/67, pulse 76, temperature 98.2 F (36.8 C), temperature source Oral, resp. rate 17, height 5\' 7"  (1.702 m), weight 102.5 kg (225 lb 15.5 oz), SpO2 95.00%. No results found. No results found for this basename: WBC, HGB, HCT, PLT,  in the last 72 hours No results found for this basename: NA, K, CL, CO, GLUCOSE, BUN, CREATININE, CALCIUM,  in the last 72 hours CBG (last 3)  No results found for this basename: GLUCAP,  in the last 72 hours  Wt Readings from Last 3 Encounters:  10/01/12 102.5 kg (225 lb 15.5 oz)  09/23/12 113.399 kg (250 lb)  08/11/12 168.9 kg (372 lb 5.7 oz)    Physical Exam:  Constitutional: She is oriented to person, place, and time. She appears well-developed. obese  HENT:  Head: Normocephalic, oral mucosa pink and moist.  Eyes:  Pupils round and reactive to light  Neck: Normal range of motion. Neck supple. No thyromegaly present.  Cardiovascular: Normal rate and regular rhythm. No murmurs.  Pulmonary/Chest: Effort normal and breath sounds normal. No respiratory distress. She has no wheezes.  Abdominal: Soft. Bowel sounds are normal. She exhibits no distension. There is no tenderness.  Musculoskeletal:  Trace to 1+ edema lower extremities.  NEURO: Decreased sensation to light touch bilaterally at toes. UE 4+/5 deltoids, biceps, triceps, WE, HI. LE 4 HF, 4+ KE, 4+/5 ADF and APF. DTR'S are 1+.  She is alert and oriented to person, place, and time.  Follows full commands. Has excellent insight and awareness.  Skin: Skin is warm and dry.  Psychiatric: She has a normal mood and affect.    Assessment/Plan: 1. Functional deficits secondary to critical illness myopathy  and deconditioning after VDRF/ septic shock which require 3+ hours per day of interdisciplinary therapy in a comprehensive inpatient rehab setting. Physiatrist is providing close team supervision and 24 hour management of active medical problems listed below. Physiatrist and rehab team continue to assess barriers to discharge/monitor patient progress toward functional and medical goals.    FIM: FIM - Bathing Bathing Steps Patient Completed: Chest;Right Arm;Left Arm;Abdomen;Front perineal area;Buttocks;Right upper leg;Left upper leg;Right lower leg (including foot);Left lower leg (including foot) Bathing: 6: Assistive device (Comment)  FIM - Upper Body Dressing/Undressing Upper body dressing/undressing steps patient completed: Thread/unthread right bra strap;Thread/unthread left bra strap;Thread/unthread right sleeve of pullover shirt/dresss;Thread/unthread left sleeve of pullover shirt/dress;Hook/unhook bra;Put head through opening of pull over shirt/dress;Pull shirt over trunk Upper body dressing/undressing: 5: Set-up assist to: Obtain clothing/put away FIM - Lower Body Dressing/Undressing Lower body dressing/undressing steps patient completed: Thread/unthread right underwear leg;Thread/unthread left underwear leg;Pull underwear up/down;Thread/unthread right pants leg;Thread/unthread left pants leg;Pull pants up/down;Don/Doff right sock;Don/Doff left sock;Don/Doff right shoe;Don/Doff left shoe;Fasten/unfasten right shoe;Fasten/unfasten left shoe Lower body dressing/undressing: 5: Supervision: Safety issues/verbal cues  FIM - Toileting Toileting steps completed by patient: Adjust clothing prior to toileting;Performs perineal hygiene;Adjust clothing after toileting Toileting Assistive Devices: Grab bar or rail for support Toileting: 6: Assistive device: No helper  FIM - Diplomatic Services operational officer Devices: Therapist, music Transfers: 0-Activity did not occur  FIM - Physiological scientist Devices: Environmental consultant;Arm rests Bed/Chair Transfer: 4: Chair or W/C > Bed: Min A (steadying Pt. > 75%);6:  Sit > Supine: No assist  FIM - Locomotion: Wheelchair Distance: 175 Locomotion: Wheelchair: 6: Travels 150 ft or more, turns around, maneuvers to table, bed or toilet, negotiates 3% grade: maneuvers on rugs and over door sills independently FIM - Locomotion: Ambulation Locomotion: Ambulation Assistive Devices: Designer, industrial/product Ambulation/Gait Assistance: 5: Supervision Locomotion: Ambulation: 1: Travels less than 50 ft with supervision/safety issues  Comprehension Comprehension Mode: Auditory Comprehension: 7-Follows complex conversation/direction: With no assist  Expression Expression Mode: Verbal Expression: 7-Expresses complex ideas: With no assist  Social Interaction Social Interaction: 7-Interacts appropriately with others - No medications needed.  Problem Solving Problem Solving: 7-Solves complex problems: Recognizes & self-corrects  Memory Memory: 7-Complete Independence: No helper  Medical Problem List and Plan:  1. 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 DVT Prophylaxis/Anticoagulation:   Venous dopplers show extensive bilateral femoral, popliteal DVT's. Xarelto initiated. All activities resumed yesterday without issue   3. Mood: Zoloft 25 mg daily. Provide emotional support and positive reinforcement . In good spirits considering the situation. 4. Neuropsych: This patient is capable of making decisions on her own behalf.  5. Pulmonary. Continue oxygen as needed at 1-2 L.  -It is possible that she may have embolized a small clot to her lungs    -continue weaning oxygen as tolerated 6. Acute renal failure-resolved.  BUN and CR wnl 7. Clostridium difficile diarrhea.resolved 8. Hypertension. Lasix 40 mg daily, hydralazine stopped. bp normotensive to hypotensive at times.   -decreased lasix to 20mg --may  be able to dc entirely LOS (Days) 16 A FACE TO FACE EVALUATION WAS PERFORMED  SWARTZ,ZACHARY T 10/10/2012 7:41 AM

## 2012-10-11 ENCOUNTER — Inpatient Hospital Stay (HOSPITAL_COMMUNITY): Payer: Managed Care, Other (non HMO) | Admitting: *Deleted

## 2012-10-11 DIAGNOSIS — I80299 Phlebitis and thrombophlebitis of other deep vessels of unspecified lower extremity: Secondary | ICD-10-CM

## 2012-10-11 DIAGNOSIS — G7281 Critical illness myopathy: Secondary | ICD-10-CM

## 2012-10-11 DIAGNOSIS — J9600 Acute respiratory failure, unspecified whether with hypoxia or hypercapnia: Secondary | ICD-10-CM

## 2012-10-11 MED ORDER — CEPHALEXIN 500 MG PO CAPS
500.0000 mg | ORAL_CAPSULE | Freq: Three times a day (TID) | ORAL | Status: DC
  Administered 2012-10-11 – 2012-10-12 (×4): 500 mg via ORAL
  Filled 2012-10-11 (×7): qty 1

## 2012-10-11 NOTE — Progress Notes (Signed)
Patient ID: [name], female   DOB: [birth year], 57 y.o.   MRN: [id number] Subjective/Complaints:   Bump around R bra strap.  Has hx of lipoma but is getting bigger, draining a little , has mepilex A 12 point review of systems has been performed and if not noted above is otherwise negative.   Objective: Vital Signs: Blood pressure 114/73, pulse 89, temperature 98.3 F (36.8 C), temperature source Oral, resp. rate 18, height 5\' 7"  (1.702 m), weight 102.5 kg (225 lb 15.5 oz), SpO2 96.00%. No results found. No results found for this basename: WBC, HGB, HCT, PLT,  in the last 72 hours No results found for this basename: NA, K, CL, CO, GLUCOSE, BUN, CREATININE, CALCIUM,  in the last 72 hours CBG (last 3)  No results found for this basename: GLUCAP,  in the last 72 hours  Wt Readings from Last 3 Encounters:  10/01/12 102.5 kg (225 lb 15.5 oz)  09/23/12 113.399 kg (250 lb)  08/11/12 168.9 kg (372 lb 5.7 oz)    Physical Exam:  Constitutional: She is oriented to person, place, and time. She appears well-developed. obese  HENT:  Head: Normocephalic, oral mucosa pink and moist.  Eyes:  Pupils round and reactive to light  Neck: Normal range of motion. Neck supple. No thyromegaly present.  Cardiovascular: Normal rate and regular rhythm. No murmurs.  Pulmonary/Chest: Effort normal and breath sounds normal. No respiratory distress. She has no wheezes.  Abdominal: Soft. Bowel sounds are normal. She exhibits no distension. There is no tenderness.  Musculoskeletal:  Trace to 1+ edema lower extremities.  NEURO: Decreased sensation to light touch bilaterally at toes. UE 4+/5 deltoids, biceps, triceps, WE, HI. LE 4 HF, 4+ KE, 4+/5 ADF and APF. DTR'S are 1+.  She is alert and oriented to person, place, and time.  Follows full commands. Has excellent insight and awareness.  Skin: Skin is warm and dry. R upper back has raised lesion with erythema and ecchymosis Psychiatric: She has a normal mood and  affect.    Assessment/Plan: 1. 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 Physiatrist is providing close team supervision and 24 hour management of active medical problems listed below. Physiatrist and rehab team continue to assess barriers to discharge/monitor patient progress toward functional and medical goals.    FIM: FIM - Bathing Bathing Steps Patient Completed: Chest;Right Arm;Left Arm;Abdomen;Front perineal area;Buttocks;Right upper leg;Left upper leg;Right lower leg (including foot);Left lower leg (including foot) Bathing: 6: Assistive device (Comment)  FIM - Upper Body Dressing/Undressing Upper body dressing/undressing steps patient completed: Thread/unthread right bra strap;Thread/unthread left bra strap;Thread/unthread right sleeve of pullover shirt/dresss;Thread/unthread left sleeve of pullover shirt/dress;Hook/unhook bra;Put head through opening of pull over shirt/dress;Pull shirt over trunk Upper body dressing/undressing: 5: Set-up assist to: Obtain clothing/put away FIM - Lower Body Dressing/Undressing Lower body dressing/undressing steps patient completed: Thread/unthread right underwear leg;Thread/unthread left underwear leg;Pull underwear up/down;Thread/unthread right pants leg;Thread/unthread left pants leg;Pull pants up/down;Don/Doff right sock;Don/Doff left sock;Don/Doff right shoe;Don/Doff left shoe;Fasten/unfasten right shoe;Fasten/unfasten left shoe Lower body dressing/undressing: 5: Supervision: Safety issues/verbal cues  FIM - Toileting Toileting steps completed by patient: Adjust clothing prior to toileting;Performs perineal hygiene;Adjust clothing after toileting Toileting Assistive Devices: Grab bar or rail for support Toileting: 6: Assistive device: No helper  FIM - Quarry manager Devices: Grab bars Toilet Transfers: 0-Activity did not occur  FIM - Bed/Chair  Transport planner Devices: Environmental consultant;Arm rests Bed/Chair Transfer: 4: Chair or W/C > Bed: Min A (steadying Pt. > 75%);6: Sit > Supine: No assist  FIM - Locomotion: Wheelchair Distance: 175 Locomotion: Wheelchair: 6: Travels 150 ft or more, turns around, maneuvers to table, bed or toilet, negotiates 3% grade: maneuvers on rugs and over door sills independently FIM - Locomotion: Ambulation Locomotion: Ambulation Assistive Devices: Designer, industrial/product Ambulation/Gait Assistance: 4: Min guard Locomotion: Ambulation: 2: Travels 50 - 149 ft with minimal assistance (Pt.>75%)  Comprehension Comprehension Mode: Auditory Comprehension: 7-Follows complex conversation/direction: With no assist  Expression Expression Mode: Verbal Expression: 7-Expresses complex ideas: With no assist  Social Interaction Social Interaction: 7-Interacts appropriately with others - No medications needed.  Problem Solving Problem Solving: 7-Solves complex problems: Recognizes & self-corrects  Memory Memory: 7-Complete Independence: No helper  Medical Problem List and Plan:  1. 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 DVT Prophylaxis/Anticoagulation:   Venous dopplers show extensive bilateral femoral, popliteal DVT's. Xarelto initiated. All activities resumed yesterday without issue   3. Mood: Zoloft 25 mg daily. Provide emotional support and positive reinforcement . In good spirits considering the situation. 4. Neuropsych: This patient is capable of making decisions on her own behalf.  5. Pulmonary. Continue oxygen as needed at 1-2 L.  -It is possible that she may have embolized a small clot to her lungs    -continue weaning oxygen as tolerated 6. Acute renal failure-resolved.  BUN and CR wnl 7. Clostridium difficile diarrhea.resolved 8. Hypertension. Lasix 40 mg  daily, hydralazine stopped. bp normotensive to hypotensive at times.   -decreased lasix to 20mg --may be able to dc entirely 9.  Large pustule, probable filliculitis in area of lipoma, start keflex monitor for diarrhea LOS (Days) 17 A FACE TO FACE EVALUATION WAS PERFORMED  KIRSTEINS,ANDREW E 10/11/2012 9:28 AM

## 2012-10-11 NOTE — Progress Notes (Signed)
Physical Therapy Note  Patient Details  Name: [name] MRN: [id number] Date of Birth: 1956-03-26 Today's Date: 10/11/2012  1000-1055 (55 minutes) individual Pain: no complaint of pain Other: Oxygen sats > 92% on 1 L [place] Pt participated in PT group session focused on gait training/safety/endurance. Pt ambulates 3 X 80 feet with RW close SBA. Pt Oxygen sats remain > 92% during activity except during first gait session when not using oxygen (dropped to 86% recovering to > 92% on 1 L [place]); Nustep Level 5 X 10 minutes.   [name],JIM 10/11/2012, 12:47 PM

## 2012-10-12 ENCOUNTER — Inpatient Hospital Stay (HOSPITAL_COMMUNITY): Payer: Managed Care, Other (non HMO)

## 2012-10-12 MED ORDER — CEFAZOLIN SODIUM 1-5 GM-% IV SOLN
1.0000 g | Freq: Three times a day (TID) | INTRAVENOUS | Status: DC
  Administered 2012-10-12 – 2012-10-13 (×4): 1 g via INTRAVENOUS
  Filled 2012-10-12 (×7): qty 50

## 2012-10-12 NOTE — Progress Notes (Signed)
Occupational Therapy Session Note  Patient Details  Name: [name] MRN: [id number] Date of Birth: 06/01/1956  Today's Date: 10/12/2012 Time: 4098-1191 Time Calculation (min): 60 min  Short Term Goals: Week 2:  OT Short Term Goal 1 (Week 2): Patient will complete LB dressing with moderate assistance OT Short Term Goal 2 (Week 2): Patient will transfer <> shower bench/seat with moderate assistance OT Short Term Goal 3 (Week 2): Patient will consistantlly perform sit<>stand with moderate assistance to help increase independence with ADLs in standing OT Short Term Goal 4 (Week 2): Patient will consistantly perform transfers with moderate assistance; squat pivot transfers OT Short Term Goal 5 (Week 2): Energy conservation techniques and importance of energy conservation will be discussed with patient  Skilled Therapeutic Interventions:  Therapeutic activities with emphasis on general endurance and strengthening and pt/family re-ed on energy conservation, use of assistive devices.   Patient able to propel w/c to gym w/o assist using 1 L 02: sats were 96% prior to exercises.   Pt. Completed 7 min of NuStep on room air, level 4, resulting in sat of 89 %, HR @ 85 BPM immediately after therex, recovering to 95 % w/HR of 80 BPM in 2 min.   Repeated NuStep with similar results 88% @ 88 BPM to 92% @ 80 BPM.   After brief recovery pt ambulated back to her room to toilet using RW, on room air: 85% @ 117 BPM recovering with 1 L 02 to 95% @ 85 BPM in 90 seconds.   OT reiterated recommendation for pt to acquire a reacher and to perform seated ADL at home, delegating heavy housework to family members.   Pt's husband was present during treatment and was fully aware of pt's progress and in support of rehab goals and recommendations as discussed.  Therapy Documentation Precautions:  Precautions Precautions: Fall Precaution Comments: trying to wean O2, used 1 L today and stayed in the 90s Restrictions Weight  Bearing Restrictions: No  Pain: Pain Assessment Pain Assessment: No/denies pain  See FIM for current functional status  Therapy/Group: Individual Therapy  Georgeanne Nim 10/12/2012, 12:24 PM

## 2012-10-12 NOTE — Progress Notes (Signed)
Patient ID: [name], female   DOB: 06-11-1956, 57 y.o.   MRN: [id number] Subjective/Complaints:   Bump around R bra strap.  Has hx of lipoma but is getting bigger, draining a little , has mepilex Pt feels it is enlarging A 12 point review of systems has been performed and if not noted above is otherwise negative.   Objective: Vital Signs: Blood pressure 114/64, pulse 79, temperature 98.2 F (36.8 C), temperature source Oral, resp. rate 18, height 5\' 7"  (1.702 m), weight 102.5 kg (225 lb 15.5 oz), SpO2 92.00%. No results found. No results found for this basename: WBC, HGB, HCT, PLT,  in the last 72 hours No results found for this basename: NA, K, CL, CO, GLUCOSE, BUN, CREATININE, CALCIUM,  in the last 72 hours CBG (last 3)  No results found for this basename: GLUCAP,  in the last 72 hours  Wt Readings from Last 3 Encounters:  10/01/12 102.5 kg (225 lb 15.5 oz)  09/23/12 113.399 kg (250 lb)  08/11/12 168.9 kg (372 lb 5.7 oz)    Physical Exam:  Constitutional: She is oriented to person, place, and time. She appears well-developed. obese  HENT:  Head: Normocephalic, oral mucosa pink and moist.  Eyes:  Pupils round and reactive to light  Neck: Normal range of motion. Neck supple. No thyromegaly present.  Cardiovascular: Normal rate and regular rhythm. No murmurs.  Pulmonary/Chest: Effort normal and breath sounds normal. No respiratory distress. She has no wheezes.  Abdominal: Soft. Bowel sounds are normal. She exhibits no distension. There is no tenderness.  Musculoskeletal:  Trace to 1+ edema lower extremities.  NEURO: Decreased sensation to light touch bilaterally at toes. UE 4+/5 deltoids, biceps, triceps, WE, HI. LE 4 HF, 4+ KE, 4+/5 ADF and APF. DTR'S are 1+.  She is alert and oriented to person, place, and time.  Follows full commands. Has excellent insight and awareness.  Skin: Skin is warm and dry. R upper back has raised lesion with erythema and ecchymosis Psychiatric:  She has a normal mood and affect.    Assessment/Plan: 1. 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 Physiatrist is providing close team supervision and 24 hour management of active medical problems listed below. Physiatrist and rehab team continue to assess barriers to discharge/monitor patient progress toward functional and medical goals.    FIM: FIM - Bathing Bathing Steps Patient Completed: Chest;Right Arm;Left Arm;Abdomen;Front perineal area;Buttocks;Right upper leg;Left upper leg;Right lower leg (including foot);Left lower leg (including foot) Bathing: 6: Assistive device (Comment)  FIM - Upper Body Dressing/Undressing Upper body dressing/undressing steps patient completed: Thread/unthread right bra strap;Thread/unthread left bra strap;Thread/unthread right sleeve of pullover shirt/dresss;Thread/unthread left sleeve of pullover shirt/dress;Hook/unhook bra;Put head through opening of pull over shirt/dress;Pull shirt over trunk Upper body dressing/undressing: 5: Set-up assist to: Obtain clothing/put away FIM - Lower Body Dressing/Undressing Lower body dressing/undressing steps patient completed: Thread/unthread right underwear leg;Thread/unthread left underwear leg;Pull underwear up/down;Thread/unthread right pants leg;Thread/unthread left pants leg;Pull pants up/down;Don/Doff right sock;Don/Doff left sock;Don/Doff right shoe;Don/Doff left shoe;Fasten/unfasten right shoe;Fasten/unfasten left shoe Lower body dressing/undressing: 5: Supervision: Safety issues/verbal cues  FIM - Toileting Toileting steps completed by patient: Adjust clothing prior to toileting;Performs perineal hygiene;Adjust clothing after toileting Toileting Assistive Devices: Grab bar or rail for support Toileting: 6: Assistive device: No helper  FIM - Ambulance person Devices: Grab bars Toilet Transfers: 0-Activity did not  occur  FIM - Banker Devices: Walker;Arm rests Bed/Chair Transfer: 4: Chair or W/C > Bed: Min A (steadying Pt. > 75%);6: Sit > Supine: No assist  FIM - Locomotion: Wheelchair Distance: 175 Locomotion: Wheelchair: 6: Travels 150 ft or more, turns around, maneuvers to table, bed or toilet, negotiates 3% grade: maneuvers on rugs and over door sills independently FIM - Locomotion: Ambulation Locomotion: Ambulation Assistive Devices: Designer, industrial/product Ambulation/Gait Assistance: 4: Min guard Locomotion: Ambulation: 2: Travels 50 - 149 ft with minimal assistance (Pt.>75%)  Comprehension Comprehension Mode: Auditory Comprehension: 7-Follows complex conversation/direction: With no assist  Expression Expression Mode: Verbal Expression: 7-Expresses complex ideas: With no assist  Social Interaction Social Interaction: 6-Interacts appropriately with others with medication or extra time (anti-anxiety, antidepressant). (taking zoloft)  Problem Solving Problem Solving: 7-Solves complex problems: Recognizes & self-corrects  Memory Memory: 7-Complete Independence: No helper  Medical Problem List and Plan:  1. 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 DVT Prophylaxis/Anticoagulation:   Venous dopplers show extensive bilateral femoral, popliteal DVT's. Xarelto initiated. All activities resumed yesterday without issue   3. Mood: Zoloft 25 mg daily. Provide emotional support and positive reinforcement . In good spirits considering the situation. 4. Neuropsych: This patient is capable of making decisions on her own behalf.  5. Pulmonary. Continue oxygen as needed at 1-2 L.  -It is possible that she may have embolized a small clot to her lungs    -continue weaning oxygen as tolerated 6. Acute renal failure-resolved.  BUN and CR wnl 7.  Clostridium difficile diarrhea.resolved 8. Hypertension. Lasix 40 mg daily, hydralazine stopped. bp normotensive to hypotensive at times.   -decreased lasix to 20mg --may be able to dc entirely 9.  Large pustule, probable folliculitis in area of lipoma, switch to IV ancef for 2 days then Keflex monitor for diarrhea LOS (Days) 18 A FACE TO FACE EVALUATION WAS PERFORMED  KIRSTEINS,ANDREW E 10/12/2012 10:02 AM

## 2012-10-12 NOTE — Progress Notes (Signed)
Declined biotene. BLE's with pitting edema, mainly to feet and ankles. Without further complaints of abdominal cramping. O2 at 1L/M via [place]. [name] A

## 2012-10-13 ENCOUNTER — Inpatient Hospital Stay (HOSPITAL_COMMUNITY): Payer: Managed Care, Other (non HMO) | Admitting: Physical Therapy

## 2012-10-13 ENCOUNTER — Inpatient Hospital Stay (HOSPITAL_COMMUNITY): Payer: Managed Care, Other (non HMO)

## 2012-10-13 ENCOUNTER — Encounter (HOSPITAL_COMMUNITY): Payer: Managed Care, Other (non HMO) | Admitting: *Deleted

## 2012-10-13 DIAGNOSIS — G7281 Critical illness myopathy: Secondary | ICD-10-CM

## 2012-10-13 DIAGNOSIS — L03319 Cellulitis of trunk, unspecified: Secondary | ICD-10-CM

## 2012-10-13 DIAGNOSIS — I80299 Phlebitis and thrombophlebitis of other deep vessels of unspecified lower extremity: Secondary | ICD-10-CM

## 2012-10-13 DIAGNOSIS — L02219 Cutaneous abscess of trunk, unspecified: Secondary | ICD-10-CM

## 2012-10-13 DIAGNOSIS — J9600 Acute respiratory failure, unspecified whether with hypoxia or hypercapnia: Secondary | ICD-10-CM

## 2012-10-13 LAB — BASIC METABOLIC PANEL
BUN: 7 mg/dL (ref 6–23)
Chloride: 104 mEq/L (ref 96–112)
Glucose, Bld: 107 mg/dL — ABNORMAL HIGH (ref 70–99)
Potassium: 4.2 mEq/L (ref 3.5–5.1)
Sodium: 141 mEq/L (ref 135–145)

## 2012-10-13 LAB — CBC
HCT: 34.1 % — ABNORMAL LOW (ref 36.0–46.0)
MCH: 29.2 pg (ref 26.0–34.0)
MCHC: 32.8 g/dL (ref 30.0–36.0)
RDW: 15.8 % — ABNORMAL HIGH (ref 11.5–15.5)

## 2012-10-13 MED ORDER — VANCOMYCIN HCL IN DEXTROSE 1-5 GM/200ML-% IV SOLN
1000.0000 mg | Freq: Two times a day (BID) | INTRAVENOUS | Status: DC
  Administered 2012-10-13 – 2012-10-14 (×2): 1000 mg via INTRAVENOUS
  Filled 2012-10-13 (×3): qty 200

## 2012-10-13 MED ORDER — LIDOCAINE-EPINEPHRINE 1 %-1:100000 IJ SOLN
30.0000 mL | Freq: Once | INTRAMUSCULAR | Status: DC
  Filled 2012-10-13: qty 30

## 2012-10-13 MED ORDER — HYDROMORPHONE HCL PF 1 MG/ML IJ SOLN
1.0000 mg | Freq: Once | INTRAMUSCULAR | Status: AC
  Administered 2012-10-13: 1 mg via INTRAVENOUS
  Filled 2012-10-13: qty 1

## 2012-10-13 NOTE — Progress Notes (Signed)
ANTIBIOTIC CONSULT NOTE - INITIAL  Pharmacy Consult for vancomycin  Indication: scapular abscess s/p I&D  Allergies  Allergen Reactions  . Aspirin Nausea And Vomiting  . Sulfa Antibiotics Nausea Only    Patient Measurements: Height: 5\' 7"  (170.2 cm) Weight: 225 lb 15.5 oz (102.5 kg) IBW/kg (Calculated) : 61.6   Vital Signs: Temp: 98.4 F (36.9 C) (03/10 1500) Temp src: Oral (03/10 1500) BP: 122/61 mmHg (03/10 1500) Pulse Rate: 80 (03/10 1500) Intake/Output from previous day: 03/09 0701 - 03/10 0700 In: 840 [P.O.:840] Out: -  Intake/Output from this shift: Total I/O In: 480 [P.O.:480] Out: -   Labs:  Recent Labs  10/13/12 0500  WBC 6.2  HGB 11.2*  PLT 354  CREATININE 0.54   Estimated Creatinine Clearance: 97.8 ml/min (by C-G formula based on Cr of 0.54). No results found for this basename: VANCOTROUGH, VANCOPEAK, VANCORANDOM, GENTTROUGH, GENTPEAK, GENTRANDOM, TOBRATROUGH, TOBRAPEAK, TOBRARND, AMIKACINPEAK, AMIKACINTROU, AMIKACIN,  in the last 72 hours   Microbiology: No results found for this or any previous visit (from the past 720 hour(s)).  Medical History: Past Medical History  Diagnosis Date  . Hypertension     Medications:   Keflex 3/8-3/9 Ancef 3/9-3/10  Assessment: [name] is a 57 yo F with a complicated medical course to start vancomycin per pharmacy protocol for a R infected cyst of the R scapular area.  She is s/p I&D, per surgeon there was a large amount of purulence and cyst content.  The wound was cultured. Her weight on 10/01/12 was 102.5 kg per bed weight.  Her creat cl is ~ 98 ml/min.  In January she had high vanc levels when she was in ARF and subsequently placed on CVVHD.  Dr. Derrell Lolling has ordered vancomycin 1000 mg IV q12.   This is the correct dose per the [facility] vancomycin dosing nomogram for pts > 100kg with creat cl > 60.  Goal of Therapy:  Vancomycin trough level 10-15 mcg/ml  Plan:  1. Vancomycin 1000 mg IV q12h 2. Anticipate  a short course of IV abx before transitioning to oral abxs, if treatment to last > 7 days we will check a vanc trough 3. F/u culture data, renal fxn, wbc, temp. Herby Abraham, Pharm.D. [phone] 10/13/2012 5:33 PM   Len Childs T 10/13/2012,5:27 PM

## 2012-10-13 NOTE — Progress Notes (Signed)
Physical Therapy Session Note  Patient Details  Name: [name] MRN: [id number] Date of Birth: 1955/08/25  Today's Date: 10/13/2012 Time: 1400-1450 Time Calculation (min): 50 min  Short Term Goals: Week 3= LTGs  Skilled Therapeutic Interventions/Progress Updates:    Educated pt and husband again on safety with stairs. Pt ascended/descended 5 steps with therapist, 2 and then 2 more with husband providing min-guard/min assist. Ambulation x 130' with RW and supervision, cues for posture and relaxing upper trapezius muscles. Car transfer with supervision, cues for safety. Pt had increased difficulty arising from low car however was able to stand  Without physical assist after two failed attempts.Standing balance activity on compliant surface + weight ball toss with husband overall min assist.   Pt's SpO2 down to 81% on room air after steps, able to return to 93% on room air with seated rest however drops very low with any activity. Pt given 0.5L [place] O2 for rest of session and was able to keep SpO2 >88%.   Therapy Documentation Precautions:  Precautions Precautions: Fall Precaution Comments: trying to wean O2 Restrictions Weight Bearing Restrictions: No Pain: Pain Assessment Pain Assessment: No/denies pain  See FIM for current functional status  Therapy/Group: Individual Therapy  [name] 10/13/2012, 3:50 PM

## 2012-10-13 NOTE — Progress Notes (Signed)
Dressing to R scapular cyst, with 90% saturation of abd pad with serousangious drainage. 4x4 gauze and idoform gauze completely saturated with blood. Dressing changed and reinforced.

## 2012-10-13 NOTE — Progress Notes (Addendum)
Patient ID: [name], female   DOB: [birth year], 57 y.o.   MRN: [id number] Subjective/Complaints:  Area around lipoma has grown in size over weekend. Remains very tender. A 12 point review of systems has been performed and if not noted above is otherwise negative.   Objective: Vital Signs: Blood pressure 109/73, pulse 75, temperature 97.6 F (36.4 C), temperature source Oral, resp. rate 20, height 5\' 7"  (1.702 m), weight 102.5 kg (225 lb 15.5 oz), SpO2 94.00%. No results found. No results found for this basename: WBC, HGB, HCT, PLT,  in the last 72 hours No results found for this basename: NA, K, CL, CO, GLUCOSE, BUN, CREATININE, CALCIUM,  in the last 72 hours CBG (last 3)  No results found for this basename: GLUCAP,  in the last 72 hours  Wt Readings from Last 3 Encounters:  10/01/12 102.5 kg (225 lb 15.5 oz)  09/23/12 113.399 kg (250 lb)  08/11/12 168.9 kg (372 lb 5.7 oz)    Physical Exam:  Constitutional: She is oriented to person, place, and time. She appears well-developed. obese  HENT:  Head: Normocephalic, oral mucosa pink and moist.  Eyes:  Pupils round and reactive to light  Neck: Normal range of motion. Neck supple. No thyromegaly present.  Cardiovascular: Normal rate and regular rhythm. No murmurs.  Pulmonary/Chest: Effort normal and breath sounds normal. No respiratory distress. She has no wheezes.  Abdominal: Soft. Bowel sounds are normal. She exhibits no distension. There is no tenderness.  Musculoskeletal:  Trace to 1+ edema lower extremities.  NEURO: Decreased sensation to light touch bilaterally at toes. UE 4+/5 deltoids, biceps, triceps, WE, HI. LE 4 HF, 4+ KE, 4+/5 ADF and APF. DTR'S are 1+.  She is alert and oriented to person, place, and time.  Follows full commands. Has excellent insight and awareness.  Skin: Skin is warm and dry. R upper back has raised lesion with erythema and ecchymosis centrally. It is about the diameter of a racuetball. Psychiatric: She  has a normal mood and affect.    Assessment/Plan: 1. 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 Physiatrist is providing close team supervision and 24 hour management of active medical problems listed below. Physiatrist and rehab team continue to assess barriers to discharge/monitor patient progress toward functional and medical goals.    FIM: FIM - Bathing Bathing Steps Patient Completed: Chest;Right Arm;Left Arm;Abdomen;Front perineal area;Buttocks;Right upper leg;Left upper leg;Right lower leg (including foot);Left lower leg (including foot) Bathing: 6: Assistive device (Comment)  FIM - Upper Body Dressing/Undressing Upper body dressing/undressing steps patient completed: Thread/unthread right bra strap;Thread/unthread left bra strap;Thread/unthread right sleeve of pullover shirt/dresss;Thread/unthread left sleeve of pullover shirt/dress;Hook/unhook bra;Put head through opening of pull over shirt/dress;Pull shirt over trunk Upper body dressing/undressing: 5: Set-up assist to: Obtain clothing/put away FIM - Lower Body Dressing/Undressing Lower body dressing/undressing steps patient completed: Thread/unthread right underwear leg;Thread/unthread left underwear leg;Pull underwear up/down;Thread/unthread right pants leg;Thread/unthread left pants leg;Pull pants up/down;Don/Doff right sock;Don/Doff left sock;Don/Doff right shoe;Don/Doff left shoe;Fasten/unfasten right shoe;Fasten/unfasten left shoe Lower body dressing/undressing: 5: Supervision: Safety issues/verbal cues  FIM - Toileting Toileting steps completed by patient: Adjust clothing prior to toileting;Performs perineal hygiene;Adjust clothing after toileting Toileting Assistive Devices: Grab bar or rail for support Toileting: 6: Assistive device: No helper  FIM - Ambulance person Devices: Grab bars;Walker Toilet Transfers: 6-To toilet/ BSC;6-From toilet/BSC  FIM - Bed/Chair  Transport planner Devices: Environmental consultant;Arm rests Bed/Chair Transfer: 4: Chair or W/C > Bed: Min A (steadying Pt. > 75%);6: Sit > Supine: No assist  FIM - Locomotion: Wheelchair Distance: 175 Locomotion: Wheelchair: 6: Travels 150 ft or more, turns around, maneuvers to table, bed or toilet, negotiates 3% grade: maneuvers on rugs and over door sills independently FIM - Locomotion: Ambulation Locomotion: Ambulation Assistive Devices: Designer, industrial/product Ambulation/Gait Assistance: 4: Min guard Locomotion: Ambulation: 2: Travels 50 - 149 ft with minimal assistance (Pt.>75%)  Comprehension Comprehension Mode: Auditory Comprehension: 7-Follows complex conversation/direction: With no assist  Expression Expression Mode: Verbal Expression: 7-Expresses complex ideas: With no assist  Social Interaction Social Interaction: 7-Interacts appropriately with others - No medications needed.  Problem Solving Problem Solving: 7-Solves complex problems: Recognizes & self-corrects  Memory Memory: 7-Complete Independence: No helper  Medical Problem List and Plan:  1. 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 DVT Prophylaxis/Anticoagulation:   Venous dopplers show extensive bilateral femoral, popliteal DVT's. Xarelto initiated. All activities resumed yesterday without issue   3. Mood: Zoloft 25 mg daily. Provide emotional support and positive reinforcement . In good spirits considering the situation. 4. Neuropsych: This patient is capable of making decisions on her own behalf.  5. Pulmonary. Continue oxygen as needed at 1-2 L.  -It is possible that she may have embolized a small clot to her lungs    -weaning off oxygen 6. Acute renal failure-resolved.  BUN and CR wnl 7. Clostridium difficile diarrhea.resolved 8.  Hypertension. Lasix 40 mg daily, hydralazine stopped. bp normotensive to hypotensive at times.   -decreased lasix to 20mg --may be able to dc entirely 9.  Large cystic lesion, probable folliculitis in area of lipoma,   -has enlarged over weekend. Contains  blood centrally (likely from arixtra)  -on iv ancef  -will contact general surgery for recommendations given anticoagulation  -ordered labs for today LOS (Days) 19 A FACE TO FACE EVALUATION WAS PERFORMED  SWARTZ,ZACHARY T 10/13/2012 8:13 AM

## 2012-10-13 NOTE — Consult Note (Signed)
[facility] Consult Note  Patient name: [name] Medical record number: [id number] Date of birth: 1956/04/03 Age: 57 y.o. Gender: female  Primary Care Provider: Johny Blamer, MD Attending Physician: Ranelle Oyster, MD  CC  Back Mass  HPI  [name] is a 57 y.o. year old female who is currently admitted to [facility] following an episode of VDRF secondary to Influenza with associated MRSA PNA.  She was initially cared for at [facility], then transferred to [facility] for consideration of ECMO, subsequently was transferred to [facility] for tracheostomy care and ultimately at [facility] for acute inpatient rehab following deconditioning and cricital illness myopathy.    She reports a painful R upper back mass that has been present >1 year that was felt to be similar to a lipoma that was previously surgically removed from her left upper back.  She had plans to have the R upper mass removed in coming months prior to her illness.  This mass was noted to be coming progressively harder and more painful with initiation of aggressive PT/OT.  It is now reported to be indurated, ecchymotic, and cellulitic with a central area of ?fluctuance.  General surgery was asked to consult for management given worsening symptoms.    Pt was otherwise relatively healthy prior to her acute illness only requiring Lisinopril for HTN.  Complications during this acute illness include, VDRF requiring tracheostomy (well healed), C Dif Colitis, B LE DVT now on xarelto.  ROS   Constitutional Feeling significantly improved, regaining energy, plans to return to home later this week (Wednesday)  Infectious No fevers no chills  Resp Mild dyspnea, no congestion, 1L [place] O2 requirment  Cardiac No CP, palpitations  GI No hematachezia, or melana  GU No dysuria, improved transfers  Skin No other bruising other than at Mass site  MSK Progressing well with therapy, no LE swelling   HISTORY  PMHx:  Past Medical History   Diagnosis Date  . Hypertension     PSHx: Past Surgical History  Procedure Laterality Date  . Cyst excision  1980    Spinal cyst  . Tonsillectomy  1980    Social Hx: History   Social History  . Marital Status: Married    Spouse Name: N/A    Number of Children: N/A  . Years of Education: N/A   Social History Main Topics  . Smoking status: Never Smoker   . Smokeless tobacco: None  . Alcohol Use: No  . Drug Use: No  . Sexually Active: Yes    Birth Control/ Protection: None   Other Topics Concern  . None   Social History Narrative  . None    Family Hx: Family History  Problem Relation Age of Onset  . Hypertension Father    Allergies: Allergies  Allergen Reactions  . Aspirin Nausea And Vomiting  . Sulfa Antibiotics Nausea Only    Home Medications: Prescriptions prior to admission  Medication Sig Dispense Refill  . acetaminophen (TYLENOL) 325 MG tablet Take 650 mg by mouth every 6 (six) hours as needed for pain or fever.      Marland Kitchen acidophilus (RISAQUAD) CAPS Take 1 capsule by mouth 2 (two) times daily.      Marland Kitchen ALPRAZolam (XANAX) 0.25 MG tablet Take 0.25 mg by mouth 3 (three) times daily as needed for anxiety.      . calcium carbonate (TUMS - DOSED IN MG ELEMENTAL CALCIUM) 500 MG chewable tablet Chew 1 tablet by mouth 4 (four) times daily.      Marland Kitchen  famotidine (PEPCID) 20 MG tablet Take 20 mg by mouth 2 (two) times daily.      . furosemide (LASIX) 20 MG tablet Take 40 mg by mouth daily.      . hydrALAZINE (APRESOLINE) 25 MG tablet Take 25 mg by mouth 4 (four) times daily.      Marland Kitchen ipratropium-albuterol (DUONEB) 0.5-2.5 (3) MG/3ML SOLN Take 3 mLs by nebulization 3 (three) times daily as needed (shortness of breath).      . metoprolol tartrate (LOPRESSOR) 25 MG tablet Take 37.5 mg by mouth 2 (two) times daily.       . potassium chloride SA (K-DUR,KLOR-CON) 20 MEQ tablet Take 40 mEq by mouth daily.      . sertraline (ZOLOFT) 50 MG tablet Take 25 mg by mouth every evening.       . temazepam (RESTORIL) 15 MG capsule Take 15 mg by mouth at bedtime.        OBJECTIVE  Vitals: Temp:  [97.6 F (36.4 C)-98.3 F (36.8 C)] 97.6 F (36.4 C) (03/10 0636) Pulse Rate:  [68-75] 75 (03/10 0636) Resp:  [18-20] 20 (03/10 0636) BP: (109-120)/(72-73) 109/73 mmHg (03/10 0636) SpO2:  [94 %-100 %] 94 % (03/10 0636)  Weight: Wt Readings from Last 3 Encounters:  10/01/12 225 lb 15.5 oz (102.5 kg)  09/23/12 250 lb (113.399 kg)  08/11/12 372 lb 5.7 oz (168.9 kg)    I&Os: Yesterday: 03/09 0701 - 03/10 0700 In: 840 [P.O.:840] Out: -  This shift:     PE: GENERAL:  Adult Obese Caucasian  female. In no discomfort; no respiratory distress. PSYCH: Alert and appropriately interactive; Insight:Good   H&N: AT/[place], trachea midline, trachea sight well healed EENT:  MMM, no scleral icterus, EOMi HEART: RRR, S1/S2 heard, no murmur LUNGS: CTA B, no wheezes, no crackles EXTREMITIES: Moves all 4 extremities spontaneously, warm well perfused, trace edema, bilateral DP and PT pulses 2/4.  TED hose in place BACK: 4-5cm well circumscribed, indurated, ecchymotic and cellulitis mass with central area of softness, no true fluctuance, central area of clearing on medial boarder  LABS: CBC BMET   Recent Labs Lab 10/13/12 0500  WBC 6.2  HGB 11.2*  HCT 34.1*  PLT 354   No results found for this basename: NA, K, CL, CO2, BUN, CREATININE, GLUCOSE, CALCIUM,  in the last 168 hours   URINE STUDIES:   MICRO:   IMAGING: No results found. LE DOPPLERS: B Popleteal DVTs   Medications:    . acidophilus  1 capsule Oral Daily  . antiseptic oral rinse  15 mL Mouth Rinse BID  . calcium carbonate  1 tablet Oral TID  .  ceFAZolin (ANCEF) IV  1 g Intravenous Q8H  . feeding supplement  237 mL Oral Q24H  . furosemide  20 mg Oral Daily  . metoprolol tartrate  37.5 mg Oral BID  . multivitamin with minerals  1 tablet Oral Daily  . pantoprazole  40 mg Oral Daily  . potassium chloride  40 mEq  Oral Daily  . [START ON 10/16/2012] rivaroxaban  20 mg Oral Daily  . rivaroxaban  15 mg Oral BID WC  . sertraline  50 mg Oral QHS   acetaminophen, diphenoxylate-atropine, ondansetron (ZOFRAN) IV, ondansetron, oxyCODONE, sorbitol  Assessment & Plan  LOS: 26 57 y.o. year old female with R back mass who is currently in CIR following VDRF due to Presumed Influenza and MRSA PNA.  # R Back Mass: known prior lipoma vs cyst; consider infected sebaceous cyst.  Will require I&D later today at bedside with Lidocaine with EPI.  Agree with continued ABX, can consider transition to doxycycline/bactrim po.    Tylenol Vs Vicodin for pain (avoid NSAIDS acutely given potential for worsening hematoma)  - Heat   - Consider [place] but low yield given difficutly differentiating hematoma from abscess, defer to Dr. Derrell Lolling  # DVT: On Xarelto for presumed 6 months.  Given potential mortality risk of DVT we feel that keeping her on Xarelto at this time is more appropriate than risking holding anticoagulation for a superficial procedure in a non-highly-vascular region.   # Deconditioning/critical illness myopathy # VDRF: s/p Tracheostomy, well healed # C Dif Diarrhea: resolved, completed course, currently on probiotics however receiving Ancef, could consider clindamycin however increased risk for recurrent C Dif compared to doxy/bactrim # HTN  # Mood Disorder    Disposition  Plan for bedside I&D later today.  Continue Xarelto. Continue ABX but okay to transition to PO.    Elective Excision of mass once off Xarelo as high likelihood of recurrence if cyst.  Andrena Mews, DO [facility] Resident - PGY-2  10/13/2012 10:00 AM

## 2012-10-13 NOTE — Consult Note (Signed)
I have seen and examined the patient and i agree with above. Consent for I&D under local

## 2012-10-13 NOTE — Progress Notes (Signed)
Occupational Therapy Note  Patient Details  Name: [name] MRN: [id number] Date of Birth: 1956/07/17 Today's Date: 10/13/2012  Time: 0900-1006 Pt c/o discomfort in left shoulder with exercise (unrated); RN aware Individual Therapy Pt amb with RW from room to gym (02 sats at 88% on 1L O2 at end of activity but recovered within 30 secs). Pt initially engaged in BUE therex on UE ergometer (5 min x 3 on level 5 - 2 times with 1L O2 and 1 time on RA).  O2 sats >90%.  Pt amb with RW to ADL apartment for dynamic standing activities in home environment.  Pt completed all tasks at supervision.   Lavone Neri [facility] 10/13/2012, 11:24 AM

## 2012-10-13 NOTE — Progress Notes (Signed)
Occupational Therapy Session Note  Patient Details  Name: Lowana Hable MRN: [id number] Date of Birth: Jul 16, 1956  Today's Date: 10/13/2012 Time: 4098-1191 Time Calculation (min): 120 min  Pt seen for community outing with TR with focus on functional mobility, energy conservation, and functional use of RUE in lunch setting. Problem solved opening doors and accessing public restroom with close supervision.  Pt able to problem solve ways to transport items while using RW and demonstrated one technique. Pt with difficulty standing from low chair without arm rests in restaurant, discussed options for seating both in community as well as adaptations for home environment.  Pt's husband present during session and educated on energy conservation strategies and safety with mobility in community setting.  Pt's O2 dropped to 81% post ambulation, but returned to low 90s after 1 minute of rest and pursed lip breathing.  See outing goal sheet in shadow chart for further information.  Therapy/Group: Co-Treatment  Leonette Monarch 10/13/2012, 3:30 PM

## 2012-10-13 NOTE — Progress Notes (Signed)
Recreational Therapy Session Note  Patient Details  Name: Morganne Haile MRN: [id number] Date of Birth: 01/12/1956 Today's Date: 10/13/2012 Time:  4696-2952 Pain: no c/o Skilled Therapeutic Interventions/Progress Updates: Pt participated in community reintegration/outing to [employer] with focus on community mobility both w/c level & ambulatory level, identifying & negotiating obstacles, accessing public restroom, &energy conservation techniques.  Discussed safe options for raising seat heights to assist with sit-->stand from low surfaces based on discussion about low seating surfaces at [employer].  Pt supervision level for all tasks.  Pt o2 sats dropped with ambulation to 81% on RA x2, but returned to low 90's after 1 minutes of seated rest break & pursed lip breathing.  Pt's husband present, observing, & participatory throughout outing and educated on all of the above.  Both stated appreciation for the experience & stated that it was a great learning opportunity.  Therapy/Group: [employer]  Evolett Somarriba 10/13/2012, 3:53 PM

## 2012-10-13 NOTE — Progress Notes (Signed)
Patient ID: [name], female   DOB: 1956-04-05, 57 y.o.   MRN: [id number] Pre Operative Diagnosis:  R infected cyst of R scapular area  Post Operative Diagnosis: same  Procedure: I&D R scapular cyst  Surgeon: Dr. Axel Filler  Anesthesia: 1% lidocaine with Epi  EBL: 5cc  Complications: none  Counts: reported as correct x 2  Findings:  Infected sebaceous cyst.  Micro cx taken  Indications for procedure:  Pt with a several day h/o R scapular cyst infection with increasing erythema and pain.  Details of the procedure: The are was prepped and draped in the usual sterile fashion.  The area was infiltrated with 1% lidocaine with epi.  There was a large amount of purulence and cyst content.  A Cx was taken from the wound.  As much of the cyst wall was debrided.  The area was packed with 1/4" iodoform guaze and was dressed with 4x4s and ABD pad.  The patient tolerated the proc well.

## 2012-10-14 ENCOUNTER — Inpatient Hospital Stay (HOSPITAL_COMMUNITY): Payer: Managed Care, Other (non HMO) | Admitting: Physical Therapy

## 2012-10-14 ENCOUNTER — Inpatient Hospital Stay (HOSPITAL_COMMUNITY): Payer: Managed Care, Other (non HMO) | Admitting: Occupational Therapy

## 2012-10-14 ENCOUNTER — Inpatient Hospital Stay (HOSPITAL_COMMUNITY): Payer: Managed Care, Other (non HMO) | Admitting: *Deleted

## 2012-10-14 MED ORDER — DOXYCYCLINE HYCLATE 100 MG PO TABS
100.0000 mg | ORAL_TABLET | Freq: Two times a day (BID) | ORAL | Status: DC
  Administered 2012-10-14 – 2012-10-15 (×3): 100 mg via ORAL
  Filled 2012-10-14 (×5): qty 1

## 2012-10-14 MED ORDER — THROMBIN 20000 UNITS EX KIT
20000.0000 [IU] | PACK | Freq: Once | CUTANEOUS | Status: DC
  Filled 2012-10-14: qty 1

## 2012-10-14 MED ORDER — SILVER NITRATE-POT NITRATE 75-25 % EX MISC
1.0000 "application " | Freq: Once | CUTANEOUS | Status: AC
  Administered 2012-10-14: 1 via TOPICAL
  Filled 2012-10-14: qty 1

## 2012-10-14 NOTE — Progress Notes (Signed)
Recreational Therapy Session Note  Patient Details  Name: [name] MRN: [id number] Date of Birth: 1955-08-17 Today's Date: 10/14/2012 Time:  1415-1445 Pain: c/o pain L shoulder, RN aware Skilled Therapeutic Interventions/Progress Updates: Session focused on discussion with pt & pt's husband about outing yesterday, plans for community pursuits post d/c, & use of leisure time post d/c.  Pt is excited to be going home & is planning to participate in activities of interest.  Pt has also determined adaptations that she can make to allow greater independence with these tasks.  Therapy/Group: Individual Therapy  SIMPSON,LISA 10/14/2012, 4:28 PM

## 2012-10-14 NOTE — Discharge Summary (Signed)
[name], [name]                   ACCOUNT NO.:  [id number]  MEDICAL RECORD NO.:  [id number]  LOCATION:  4031                         FACILITY:  [facility]  PHYSICIAN:  Ranelle Oyster, M.D.DATE OF BIRTH:  08-10-1955  DATE OF ADMISSION:  09/24/2012 DATE OF DISCHARGE:  10/15/2012                              DISCHARGE SUMMARY   DISCHARGE DIAGNOSES: 1. 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 2. Deep venous thrombosis, bilateral lower extremities. 3. Depression. 4. Respiratory failure-resolving. 5. Acute renal failure, resolved. 6. Clostridium difficile, resolved. 7. Hypertension 8. Large cystic lesion/lipoma HISTORY OF PRESENT ILLNESS:  This is a 57 year old right-handed female with history of hypertension admitted to [facility] August 05, 2012, with hypoxemic respiratory failure presumably from H1N1 pneumonia.  The patient noted fever, chills, and nausea, with decreased appetite as well as increasing shortness of breath.  She had recently been placed on Tamiflu x5 days but not tested for influenza prior to admission.  In the emergency department, started on BiPAP.  Chest x-ray with diffuse bilateral airspace disease, consolidation right upper lobe, without effusion.  Placed on broad-spectrum antibiotics.  Noted acute renal failure 2.04.  Placed on gentle IV fluids.  Renal ultrasound negative for hydronephrosis.  She required intubation on August 07, 2012, for VDRs suspect septic shock and steroid protocol initiated. Tracheostomy performed for prolonged ventilatory support and decannulated September 19, 2012, without difficulty.  Followup by Critical Care Medicine.  A nasogastric tube remained in place for nutritional support, which was later removed and diet advanced to a regular consistency.  She was transferred to [facility] for [facility] evaluation on August 11, 2012, and not felt to be required but did receive hemodialysis for a short time  while at [facility].  She was transferred to [facility] August 22, 2012, for ongoing care if creatinine continued to improve with latest creatinine is 0.41.  Therapies initiated with noted lower extremity weakness, suspect critical illness myopathy secondary to steroids.  She was on subcutaneous heparin for DVT prophylaxis.  During her hospital stay at [facility] developed Clostridium difficile, completed a course of vancomycin and Flagyl September 23, 2012.  Noted limited endurance, she was admitted to inpatient rehab services for comprehensive rehab program.  PAST MEDICAL HISTORY:  See discharge diagnoses.  SOCIAL HISTORY:  Lives with spouse.  FUNCTIONAL HISTORY:  Prior to admission was independent.  Functional status upon admission to rehab services was requiring minimum to moderate assist for supine to edge of bed.  PHYSICAL EXAMINATION:  VITAL SIGNS:  Blood pressure 110/60, pulse 78, respirations 18, oxygen saturations 92% on 1 L oxygen. GENERAL:  The patient was alert and oriented x3. LUNGS:  Clear to auscultation. CARDIAC:  Regular rate and rhythm. ABDOMEN:  Soft and nontender.  Good bowel sounds.  Decreased sensation to light touch bilaterally at the toes.  She had good insight to her deficits.  Tracheostomy site is well healed.  REHABILITATION HOSPITAL COURSE:  The patient was admitted to inpatient rehab services with therapies initiated on a 3-hour daily basis consisting of physical therapy, occupational therapy, and rehabilitation nursing.  The following issues were addressed during the patient's rehabilitation stay.  Pertaining to [name] deconditioning critical illness myopathy, she had completed a course of steroids.  Tracheostomy had since been decannulated.  She continued to progress her therapies. She was on subcutaneous heparin for DVT prophylaxis upon admission to rehab services.  Bilateral lower extremity Dopplers completed  September 25, 2012, to rule out any DVT.  It did show findings consistent with subacute deep vein thrombosis involving the right common femoral vein, right profunda femoris vein, right femoral vein, right popliteal vein, posterior tibial vein, right peroneal vein, left common femoral vein, left profunda femoris vein.  Due to these findings, she was placed on Xarelto per protocol.  All issues discussed with husband and wife in regards to her DVT.  She remained asymptomatic.  Pulmonary function continued to improve.  She was slowly being weaned from her oxygen in hopes that she could be off by the time she went home.  Acute renal failure resolving.  Latest BUN and creatinine within normal limits.  She had completed a course of vancomycin and Flagyl for Clostridium difficile or bowel program was well established.  Her blood pressures remained well controlled.  She did have some mild hypotension early on her course.  Her Lasix had been decreased.  Noted chronic large cystic lesions to her back.  General surgery was consulted.  Underwent I and D of right scapular cyst, October 13, 2012, per General Surgery. Patient was placed on vancomycin after irrigation and debridement and planned to be discharged to home on Bactrim x2 weeks and followup with Gen. surgery. Surgical site was healing nicely and monitored.  The patient received weekly collaborative interdisciplinary team conferences to discuss estimated length of stay, family teaching, and any barriers to her discharge.  She was continent of bowel and bladder.  Overall, supervision minimal assist for activities daily living, supervision minimal assist overall mobility.  Her husband had been through full family teaching, is also with her sons.  Plans to be discharged home on October 15, 2012, with ongoing care and therapies as dictated per rehab services.  DISCHARGE MEDICATIONS:  At the time of dictation included RisaQuad 1 capsule p.o. b.i.d., Lasix  20 mg p.o. daily, Lopressor 37.5 mg p.o. b.i.d., multivitamin daily, oxycodone immediate release 5 mg 1 tablet every 4 hours as needed pain, Protonix 40 mg daily, potassium chloride 20 mEq daily, and Xarelto 20 mg daily. Bactrim DS 1 by mouth twice a day x2 weeks DIET:  Regular.  SPECIAL INSTRUCTIONS:  The patient should follow up with Dr. Faith Rogue the outpatient rehab service office, October 29, 2012, Dr. Johny Blamer medical management, and Dr. Derrell Lolling of General Surgery, call for appointment.     Mariam Dollar, P.A.   ______________________________ Ranelle Oyster, M.D.    DA/MEDQ  D:  10/14/2012  T:  10/14/2012  Job:  [id number]  cc:   Melida Quitter, M.D. Dr. Rogelia Mire Ranelle Oyster, M.D.

## 2012-10-14 NOTE — Discharge Summary (Signed)
  Discharge summary job # [id number]

## 2012-10-14 NOTE — Progress Notes (Signed)
I have seen and examined the patient and agree with PA-Osborne's note. OK for PO abx When d/c'd f/u in 1-2 weeks

## 2012-10-14 NOTE — Progress Notes (Signed)
Patient ID: [name], female   DOB: 03-Nov-1955, 57 y.o.   MRN: [id number] Subjective/Complaints:  back cyst I&D'd. Pt tolerated well. Happy that it was cleaned out. Pain under control. Mild bleeding initially.. A 12 point review of systems has been performed and if not noted above is otherwise negative.   Objective: Vital Signs: Blood pressure 114/74, pulse 84, temperature 98.7 F (37.1 C), temperature source Oral, resp. rate 18, height 5\' 7"  (1.702 m), weight 102.5 kg (225 lb 15.5 oz), SpO2 94.00%. No results found.  Recent Labs  10/13/12 0500  WBC 6.2  HGB 11.2*  HCT 34.1*  PLT 354    Recent Labs  10/13/12 0500  NA 141  K 4.2  CL 104  GLUCOSE 107*  BUN 7  CREATININE 0.54  CALCIUM 9.6   CBG (last 3)  No results found for this basename: GLUCAP,  in the last 72 hours  Wt Readings from Last 3 Encounters:  10/01/12 102.5 kg (225 lb 15.5 oz)  09/23/12 113.399 kg (250 lb)  08/11/12 168.9 kg (372 lb 5.7 oz)    Physical Exam:  Constitutional: She is oriented to person, place, and time. She appears well-developed. obese  HENT:  Head: Normocephalic, oral mucosa pink and moist.  Eyes:  Pupils round and reactive to light  Neck: Normal range of motion. Neck supple. No thyromegaly present.  Cardiovascular: Normal rate and regular rhythm. No murmurs.  Pulmonary/Chest: Effort normal and breath sounds normal. No respiratory distress. She has no wheezes.  Abdominal: Soft. Bowel sounds are normal. She exhibits no distension. There is no tenderness.  Musculoskeletal:  Trace to 1+ edema lower extremities.  NEURO: Decreased sensation to light touch bilaterally at toes. UE 4+/5 deltoids, biceps, triceps, WE, HI. LE 4 HF, 4+ KE, 4+/5 ADF and APF. DTR'S are 1+.  She is alert and oriented to person, place, and time.  Follows full commands. Has excellent insight and awareness.  Skin: Skin is warm and dry. R upper back  Area dressed. No blood through dressing at present.Psychiatric: She  has a normal mood and affect.    Assessment/Plan: 1. 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 Physiatrist is providing close team supervision and 24 hour management of active medical problems listed below. Physiatrist and rehab team continue to assess barriers to discharge/monitor patient progress toward functional and medical goals.    FIM: FIM - Bathing Bathing Steps Patient Completed: Chest;Right Arm;Left Arm;Abdomen;Front perineal area;Buttocks;Right upper leg;Left upper leg;Right lower leg (including foot);Left lower leg (including foot) Bathing: 6: Assistive device (Comment)  FIM - Upper Body Dressing/Undressing Upper body dressing/undressing steps patient completed: Thread/unthread right bra strap;Thread/unthread left bra strap;Thread/unthread right sleeve of pullover shirt/dresss;Thread/unthread left sleeve of pullover shirt/dress;Hook/unhook bra;Put head through opening of pull over shirt/dress;Pull shirt over trunk Upper body dressing/undressing: 5: Set-up assist to: Obtain clothing/put away FIM - Lower Body Dressing/Undressing Lower body dressing/undressing steps patient completed: Thread/unthread right underwear leg;Thread/unthread left underwear leg;Pull underwear up/down;Thread/unthread right pants leg;Thread/unthread left pants leg;Pull pants up/down;Don/Doff right sock;Don/Doff left sock;Don/Doff right shoe;Don/Doff left shoe;Fasten/unfasten right shoe;Fasten/unfasten left shoe Lower body dressing/undressing: 5: Supervision: Safety issues/verbal cues  FIM - Toileting Toileting steps completed by patient: Adjust clothing prior to toileting;Performs perineal hygiene;Adjust clothing after toileting Toileting Assistive Devices: Grab bar or rail for support Toileting: 6: Assistive device: No helper  FIM - Ambulance person Devices: Grab  bars;Walker Toilet Transfers: 6-To toilet/ BSC;6-From toilet/BSC  FIM - Banker Devices: Environmental consultant;Arm rests Bed/Chair Transfer: 4: Chair or W/C > Bed: Min A (steadying Pt. > 75%);6: Sit > Supine: No assist  FIM - Locomotion: Wheelchair Distance: 175 Locomotion: Wheelchair: 6: Travels 150 ft or more, turns around, maneuvers to table, bed or toilet, negotiates 3% grade: maneuvers on rugs and over door sills independently FIM - Locomotion: Ambulation Locomotion: Ambulation Assistive Devices: Designer, industrial/product Ambulation/Gait Assistance: 5: Supervision Locomotion: Ambulation: 2: Travels 50 - 149 ft with supervision/safety issues  Comprehension Comprehension Mode: Auditory Comprehension: 7-Follows complex conversation/direction: With no assist  Expression Expression Mode: Verbal Expression: 7-Expresses complex ideas: With no assist  Social Interaction Social Interaction: 7-Interacts appropriately with others - No medications needed.  Problem Solving Problem Solving: 7-Solves complex problems: Recognizes & self-corrects  Memory Memory: 7-Complete Independence: No helper  Medical Problem List and Plan:  1. 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 DVT Prophylaxis/Anticoagulation:   Venous dopplers show extensive bilateral femoral, popliteal DVT's. Xarelto initiated. All activities resumed yesterday without issue --favor 12 months of therapy  3. Mood: Zoloft 25 mg daily. Provide emotional support and positive reinforcement . In good spirits considering the situation. 4. Neuropsych: This patient is capable of making decisions on her own behalf.  5. Pulmonary. Continue oxygen as needed at 1-2 L.  -It is possible that she may have embolized a small clot to her lungs    -will need oxygen at dc 6. Acute renal failure-resolved.  BUN and CR wnl 7. Clostridium difficile  diarrhea.resolved 8. Hypertension. Lasix 40 mg daily, hydralazine stopped. bp normotensive to hypotensive at times.   -decreased lasix to 20mg --may be able to dc entirely 9.  Large cyst with infection/puss on back/ s/p cleanout, debridement,   -on iv vanc- switch to PO abx at dc tomorrow  -appreciate general surgery help  LOS (Days) 20 A FACE TO FACE EVALUATION WAS PERFORMED  SWARTZ,ZACHARY T 10/14/2012 8:14 AM

## 2012-10-14 NOTE — Progress Notes (Signed)
Social Work Patient ID: [name], female   DOB: [birth year], 57 y.o.   MRN: [id number]  Have spoken with patient and husband today to review d/c arrangements in place.  DME all being delivered today.  HH referrals made with plan to start service the day after d/c.  Pt admits not feeling very well today likely due to meds given for procedure yesterday but excited for d/c tomorrow.    HOYLE, LUCY, LCSW

## 2012-10-14 NOTE — Progress Notes (Signed)
Physical Therapy Note  Patient Details  Name: Melissia Lahman MRN: [id number] Date of Birth: 10-19-1955 Today's Date: 10/14/2012  Time: 1300-1410 70 minutes  No c/o pain.  Treatment session focused on gait training with RW outdoors on a variety of surfaces. Pt gait with RW supervision level on sidewalk and asphalt surfaces, up/down incline and over thresholds up to 200' before needing seated rest.  Able to sit to stand from a variety of surfaces with supervision.  Community gait with ordering/purchasing items from [employer] with supervision.  Discussed energy conservation techniques for home with pt/husband who both express understanding.  Individual therapy   DONAWERTH,KAREN 10/14/2012, 2:10 PM

## 2012-10-14 NOTE — Progress Notes (Signed)
NUTRITION FOLLOW UP  DOCUMENTATION CODES  Per approved criteria   -Non-severe (moderate) malnutrition in the context of acute illness or injury  -Obesity Unspecified    Intervention:   1. Continue Ensure Complete po daily, each supplement provides 350 kcal and 13 grams of protein.  2. RD to continue to follow nutrition care plan  Nutrition Dx:   Unintentional wt loss related to acute illness and prolonged hospitalization as evidenced by wt loss of 10% x at least 2 months. Improving.  Goal:   Intake to meet >90% of estimated nutrition needs. Met.  Monitor:   weight trends, lab trends, I/O's, PO intake, supplement tolerance  Assessment:   Continues on Regular diet. Eating very well. Continues to work towards d/c tomorrow. Continues with orders for Ensure Complete daily.  Height: Ht Readings from Last 1 Encounters:  09/24/12 5\' 7"  (1.702 m)    Weight Status:   Wt Readings from Last 1 Encounters:  10/01/12 225 lb 15.5 oz (102.5 kg)  No new weight.  Re-estimated needs:  Kcal: 2000 - 2200 Protein: 90 - 110 Fluid: 2  - 2.2  Skin: scratch marks  Diet Order: General   Intake/Output Summary (Last 24 hours) at 10/14/12 1042 Last data filed at 10/14/12 0800  Gross per 24 hour  Intake    960 ml  Output      0 ml  Net    960 ml    Last BM: 3/10   Labs:   Recent Labs Lab 10/13/12 0500  NA 141  K 4.2  CL 104  CO2 26  BUN 7  CREATININE 0.54  CALCIUM 9.6  GLUCOSE 107*    CBG (last 3)  No results found for this basename: GLUCAP,  in the last 72 hours  Scheduled Meds: . acidophilus  1 capsule Oral Daily  . antiseptic oral rinse  15 mL Mouth Rinse BID  . calcium carbonate  1 tablet Oral TID  . doxycycline  100 mg Oral Q12H  . feeding supplement  237 mL Oral Q24H  . furosemide  20 mg Oral Daily  . lidocaine-EPINEPHrine  30 mL Intradermal Once  . metoprolol tartrate  37.5 mg Oral BID  . multivitamin with minerals  1 tablet Oral Daily  . pantoprazole  40 mg  Oral Daily  . potassium chloride  40 mEq Oral Daily  . [START ON 10/16/2012] rivaroxaban  20 mg Oral Daily  . rivaroxaban  15 mg Oral BID WC  . sertraline  50 mg Oral QHS  . silver nitrate applicators  1 application Topical Once  . thrombin  20,000 Units Topical Once    Continuous Infusions:  none  Jarold Motto MS, RD, LDN Pager: [phone] After-hours pager: [phone]

## 2012-10-14 NOTE — Progress Notes (Signed)
Recreational Therapy Discharge Summary Patient Details  Name: [name] MRN: [id number] Date of Birth: Sep 18, 1955 Today's Date: 10/14/2012  Long term goals set: 1  Long term goals met: 1  Comments on progress toward goals: Pt has made excellent progress toward goal and is ready for discharge home with family.  Pt is supervision for community reintegration & has been educated on energy conservation techniques & negotiation of barriers.  Pt's husband has been present & observing and/or participating in sessions. Reasons for discharge: discharge from hospital  Patient/family agrees with progress made and goals achieved: Yes  SIMPSON,LISA 10/14/2012, 4:32 PM

## 2012-10-14 NOTE — Patient Care Conference (Signed)
Inpatient RehabilitationTeam Conference and Plan of Care Update Date: 10/14/2012   Time: 2:05 PM    Patient Name: [name]      Medical Record Number: [id number]  Date of Birth: 05-08-1956 Sex: Female         Room/Bed: 4031/4031-01 Payor Info: Payor: [employer]  Plan: [employer] MANAGED  Product Type: *No Product type*     Admitting Diagnosis: critical illness mypoathy h/o  Admit Date/Time:  09/24/2012  2:33 PM Admission Comments: No comment available   Primary Diagnosis:  Myopathy Principal Problem: Myopathy  Patient Active Problem List   Diagnosis Date Noted  . Vertigo 10/05/2012  . Myopathy 09/25/2012  . Chronic respiratory failure 09/01/2012  . 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 Acute respiratory failure with hypoxia 08/06/2012  . Influenza-like illness 08/06/2012  . CAP (community acquired pneumonia) 08/06/2012  . AKI (acute kidney injury) 08/06/2012  . Hypertension 08/06/2012    Expected Discharge Date: Expected Discharge Date: 10/15/12  Team Members Present: Physician leading conference: Dr. Faith Rogue Social Worker Present: Amada Jupiter, LCSW Nurse Present: Daryll Brod, RN PT Present: Karolee Stamps, PT OT Present: Mackie Pai, OT;Patricia Mat Carne, OT Other (Discipline and Name): Ottie Glazier, RN     Current Status/Progress Goal Weekly Team Focus  Medical   continues to need home oxygen. infected cyst--vanc to bactrim. surgery excised  see above  wound care/education   Bowel/Bladder   Continent of bowel and bladder, LBM 10/13/12  Pt to remain continent of bowel and bladder  Monitor   Swallow/Nutrition/ Hydration             ADL's   overall mod I  overall mod I  D/C planning   Mobility   modified independent/supervision  modified independent/supervision  endurance, family education, balance, preparation for home D/C.    Communication             Safety/Cognition/ Behavioral Observations            Pain   Oxy IR 5mg  q 4hrs prn  Pain to be managed with prn  med  Monitor for effectiveness   Skin   Red sebecaneous cyst with yellow core  No additional skin breakdown  Monitor     Rehab Goals Patient on target to meet rehab goals: Yes *See Interdisciplinary Assessment and Plan and progress notes for long and short-term goals  Barriers to Discharge: wound care    Possible Resolutions to Barriers:  none    Discharge Planning/Teaching Needs:  home with husband and adult children available to provide 24/7 assistance      Team Discussion:  Has made excellent gains and team feels patient ready for d/c tomorrow!  Revisions to Treatment Plan:  None   Continued Need for Acute Rehabilitation Level of Care: The patient requires daily medical management by a physician with specialized training in physical medicine and rehabilitation for the following conditions: Daily direction of a multidisciplinary physical rehabilitation program to ensure safe treatment while eliciting the highest outcome that is of practical value to the patient.: Yes Daily medical management of patient stability for increased activity during participation in an intensive rehabilitation regime.: Yes Daily analysis of laboratory values and/or radiology reports with any subsequent need for medication adjustment of medical intervention for : Post surgical problems;Neurological problems;Other  HOYLE, LUCY 10/14/2012, 5:59 PM

## 2012-10-14 NOTE — Progress Notes (Signed)
Patient ID: [name], female   DOB: [birth year], 56 y.o.   MRN: [id number]    Subjective: Pt's IV is causing her pain and it continues to beep.  No other complaints.  Objective: Vital signs in last 24 hours: Temp:  [98.4 F (36.9 C)-98.7 F (37.1 C)] 98.7 F (37.1 C) (03/11 0500) Pulse Rate:  [76-84] 84 (03/11 0500) Resp:  [18-19] 18 (03/11 0500) BP: (90-122)/(60-80) 114/74 mmHg (03/11 0500) SpO2:  [94 %-97 %] 94 % (03/11 0500) Last BM Date: 10/13/12  Intake/Output from previous day: 03/10 0701 - 03/11 0700 In: 720 [P.O.:720] Out: -  Intake/Output this shift:    PE: Skin: this area at her right scapula looks much better.  Erythema almost completely resolved.  No further purulent drainage.  Some oozing when the outer dressing removed.  Dressing change will be done later today.  Lab Results:   Recent Labs  10/13/12 0500  WBC 6.2  HGB 11.2*  HCT 34.1*  PLT 354   BMET  Recent Labs  10/13/12 0500  NA 141  K 4.2  CL 104  CO2 26  GLUCOSE 107*  BUN 7  CREATININE 0.54  CALCIUM 9.6   PT/INR No results found for this basename: LABPROT, INR,  in the last 72 hours CMP     Component Value Date/Time   NA 141 10/13/2012 0500   K 4.2 10/13/2012 0500   CL 104 10/13/2012 0500   CO2 26 10/13/2012 0500   GLUCOSE 107* 10/13/2012 0500   BUN 7 10/13/2012 0500   CREATININE 0.54 10/13/2012 0500   CALCIUM 9.6 10/13/2012 0500   PROT 5.8* 09/25/2012 0645   ALBUMIN 2.8* 09/25/2012 0645   AST 29 09/25/2012 0645   ALT 62* 09/25/2012 0645   ALKPHOS 161* 09/25/2012 0645   BILITOT 0.3 09/25/2012 0645   GFRNONAA >90 10/13/2012 0500   GFRAA >90 10/13/2012 0500   Lipase     Component Value Date/Time   LIPASE 90* 08/05/2012 2223       Studies/Results: No results found.  Anti-infectives: Anti-infectives   Start     Dose/Rate Route Frequency Ordered Stop   10/14/12 0830  doxycycline (VIBRA-TABS) tablet 100 mg     100 mg Oral Every 12 hours 10/14/12 0829     10/13/12 1800  vancomycin  (VANCOCIN) IVPB 1000 mg/200 mL premix  Status:  Discontinued     1,000 mg 200 mL/hr over 60 Minutes Intravenous Every 12 hours 10/13/12 1710 10/14/12 0828   10/12/12 1400  ceFAZolin (ANCEF) IVPB 1 g/50 mL premix  Status:  Discontinued     1 g 100 mL/hr over 30 Minutes Intravenous 3 times per day 10/12/12 1004 10/13/12 1709   10/11/12 0945  cephALEXin (KEFLEX) capsule 500 mg  Status:  Discontinued     500 mg Oral 3 times per day 10/11/12 0941 10/12/12 1004       Assessment/Plan  1. Infected sebaceous cyst, s/p I&D  Plan: 1. Will have thrombin spray and silver nitrate sticks available for dressing change this afternoon due to persistent oozing.  2. DC IV abx therapy and start PO abx given issues with IV.  DC IV as well. 3. Arrange HH for dressing changes   LOS: 20 days    OSBORNE,KELLY E 10/14/2012, 8:30 AM Pager: [phone]

## 2012-10-14 NOTE — Progress Notes (Signed)
Occupational Therapy Session Note &  Discharge Summary  Patient Details  Name: [name] MRN: [id number] Date of Birth: January 07, 1956  Today's Date: 10/14/2012  SESSION NOTE [id number] - 55 Minutes Individual Therapy Patient with 8/10 pain in surgical site, patient pre-medicated Patient found supine in bed. Discussed discharge planning and community re-entry outing yesterday. Patient then engaged in bed mobility and ambulated from edge of bed throughout room with rolling walker to gather necessary items for ADL (light housekeeping). Patient then ambulated into bathroom for shower stall transfer and UB/LB bathing. Then patient ambulated out of bathroom -> w/c for UB/LB dressing. Patient performing BADLs at a modified independent level. Surgical PA present for dressing change -> right shoulder area post surgical procedure yesterday. RN present to assist with threading IV through bra and shirt. [name] present during session and is independent to assist with supervision/set-up assistance prn. Patient left seated in w/c at end of session to donn bilateral shoes.   ------------------------------------------------------------------------------------------  DISCHARGE SUMMARY Patient has met 12 of 12 long term goals due to improved activity tolerance, improved balance, postural control, ability to compensate for deficits, functional use of  RIGHT upper and LEFT upper extremity, improved attention, improved awareness and improved coordination.  Patient to discharge at overall Modified Independent level.  Patient's care partner is independent to provide the necessary supervision/set-up assistance prn at discharge.    Reasons goals not met: n/a, all goals met at this time.  Recommendation: No additional occupational therapy recommended at this time, patient is performing BADLs at a  modified independent level.   Equipment: shower seat with back and BSC  Reasons for discharge: treatment goals met  and discharge from hospital  Patient/family agrees with progress made and goals achieved: Yes  Precautions/Restrictions  Precautions Precautions: Fall Precaution Comments: patient continues to need 1 liter of 02 occasionally when sats drop to >90% Restrictions Weight Bearing Restrictions: No  Pain Pain Assessment Pain Score:   8 Pain Type: Acute pain;Surgical pain Pain Location: Back Pain Orientation: Right Pain Descriptors: Constant  ADL - See FIM  Vision/Perception  Vision - History Baseline Vision: No visual deficits Patient Visual Report: No change from baseline Vision - Assessment Eye Alignment: Within Functional Limits Perception Perception: Within Functional Limits Praxis Praxis: Intact   Cognition Overall Cognitive Status: Appears within functional limits for tasks assessed Arousal/Alertness: Awake/alert Orientation Level: Oriented X4 Attention: Divided Selective Attention: Appears intact Memory: Appears intact Awareness: Appears intact Problem Solving: Appears intact Initiating: Appears intact Self Correcting: Appears intact Safety/Judgment: Appears intact Comments: continues to be anxious at times  Sensation Sensation Light Touch: Appears Intact Stereognosis: Appears Intact Hot/Cold: Appears Intact Proprioception: Appears Intact Coordination Gross Motor Movements are Fluid and Coordinated: Yes Fine Motor Movements are Fluid and Coordinated: Yes  Motor - See Discharge Summary  Mobility - See Discharge Summary  Trunk/Postural Assessment - See Discharge Summary  Balance- See Discharge Summary  Extremity/Trunk Assessment RUE Assessment RUE Assessment: Within Functional Limits LUE Assessment LUE Assessment: Within Functional Limits  See FIM for current functional status  CLAY,PATRICIA 10/14/2012, 8:04 AM

## 2012-10-14 NOTE — Progress Notes (Signed)
Physical Therapy Discharge Summary  Patient Details  Name: [name] MRN: [id number] Date of Birth: 06-25-1956  Today's Date: 10/14/2012 Time: 0930-1025 Time Calculation (min): 55 min  Skilled Therapeutic Interventions/Progress Updates:  Pt able to ambulate 210' in controlled environment with modified independence however still demonstrates impaired endurance and needs to take standing rest breaks occasionally. Ambulation in household environment 95' with RW and modified independence over carpeted and tile surfaces in varied directions (forwards, sideways, back stepping). Pt performing 5 stairs with close supervision provided by husband and use of Rt. Sided rail. Pt able to propel wheelchair >200' over varied surfaces and in community environment with modified independence. Car transfer with supervision provided by husband, no verbal cues needed for either. Discussed floor transfers, PT demonstrated kneeling transfer and boost up backwards floor transfer, pt declined to practice secondary to long standing knee issues. Discussed self-care and safety at home with pt and husband. Husband present for entire session. Pt tearful and happy with progress she has made on rehab.   Pt's SpO2 97% on 0.5 L [place] O2 at rest. With ambulation pt's SpO2 decreases to 88% on 0.5 L [place] O2 and down to 83% on room air, with 0.5 L [place] O2 pt's SpO2 increases back to 94%.   Patient has met 9 of 9 long term goals due to improved activity tolerance, improved balance, increased strength, increased range of motion and ability to compensate for deficits.  Pt has made excellent progress starting out needing two people to assist with standing to now being able to walk >100' at modified independent level. Pt has been very motivated. She is still limited by deconditioning and decreased functional mobility compared to baseline in addition to desaturation when not wearing supplemental oxygen. Therefore she will benefit from further HHPT and  outpatient PT to return to prior level of function. Patient to discharge at an ambulatory level Modified Independent.   Patient's care partner is independent to provide the necessary physical assistance at discharge.  Reasons goals not met: NA  Recommendation:  Patient will benefit from ongoing skilled PT services in home health setting progressing to outpatient physical therapy to continue to advance safe functional mobility, address ongoing impairments in endurance, strength, balance, decreased cardiorespiratory response, and minimize fall risk.  Equipment: hospital bed, wheelchair with cushion, RW  Reasons for discharge: treatment goals met and discharge from hospital  Patient/family agrees with progress made and goals achieved: Yes  PT Discharge Precautions/Restrictions Precautions Precaution Comments: patient continues to need 0.5L of O2 with activity to keep saturation >88% Restrictions Weight Bearing Restrictions: No Pain Pain Assessment Pain Score:   6 Pain Type: Acute pain;Surgical pain Pain Location: Back Pain Orientation: Right Pain Descriptors: Aching Pain Onset: On-going Pain Intervention(s):  (pt calling for medication at end of therapy session)  Cognition Overall Cognitive Status: Appears within functional limits for tasks assessed Arousal/Alertness: Awake/alert Orientation Level: Oriented X4 Attention: Divided Selective Attention: Appears intact Memory: Appears intact Awareness: Appears intact Problem Solving: Appears intact Initiating: Appears intact Self Correcting: Appears intact Safety/Judgment: Appears intact Comments: continues to be anxious at times Sensation Sensation Light Touch: Appears Intact Stereognosis: Appears Intact Hot/Cold: Appears Intact Proprioception: Appears Intact Coordination Gross Motor Movements are Fluid and Coordinated: Yes Fine Motor Movements are Fluid and Coordinated: Yes   Mobility Bed Mobility Bed Mobility: Supine  to Sit;Sit to Supine Left Sidelying to Sit:  (without rail) Supine to Sit: 6: Modified independent (Device/Increase time) Sit to Supine: 6: Modified independent (Device/Increase time) Transfers Sit  to Stand: 6: Modified independent (Device/Increase time) Stand to Sit: 6: Modified independent (Device/Increase time) Stand Pivot Transfers: 6: Modified independent (Device/Increase time) Squat Pivot Transfers: 6: Modified independent (Device/Increase time) Locomotion  Ambulation Ambulation: Yes Ambulation/Gait Assistance: 6: Modified independent (Device/Increase time) Ambulation Distance (Feet): 210 Feet Assistive device: Rolling walker Gait Gait: Yes Gait Pattern: Impaired Gait Pattern: Step-through pattern;Decreased stride length;Trunk flexed (decreased foot clearance bilaterally) High Level Ambulation High Level Ambulation: Side stepping;Backwards walking Side Stepping: modified independent Backwards Walking: modified independent Stairs / Additional Locomotion Stairs: Yes Stairs Assistance: 5: Supervision Stairs Assistance Details (indicate cue type and reason): Pt's husband providing close supervision. No verbal cues needed, supervision for safety only. Stair Management Technique: One rail Right;Sideways;Step to pattern Number of Stairs: 5 Wheelchair Mobility Wheelchair Mobility: Yes Wheelchair Assistance: 6: Modified independent (Device/Increase time) Wheelchair Parts Management: Independent Distance: >150'   Balance Balance Balance Assessed: Yes Static Sitting Balance Static Sitting - Balance Support: Feet supported Static Sitting - Level of Assistance: 7: Independent Static Standing Balance Static Standing - Balance Support: Bilateral upper extremity supported;During functional activity Static Standing - Level of Assistance: 6: Modified independent (Device/Increase time) Extremity Assessment      RLE Assessment RLE Assessment: Exceptions to [facility] RLE Strength RLE  Overall Strength: Within Functional Limits for tasks assessed RLE Overall Strength Comments: Hip flexors 4-/5, knee extension/flexion 4/5, dorsiflexion 4-/5 LLE Assessment LLE Assessment: Within Functional Limits LLE Strength LLE Overall Strength Comments: Hip flexors 3+/5, knee extension/flexion 4-/5, dorsiflexion 4-/5  See FIM for current functional status  [name] 10/14/2012, 12:35 PM

## 2012-10-15 DIAGNOSIS — G7281 Critical illness myopathy: Secondary | ICD-10-CM

## 2012-10-15 DIAGNOSIS — J9600 Acute respiratory failure, unspecified whether with hypoxia or hypercapnia: Secondary | ICD-10-CM

## 2012-10-15 DIAGNOSIS — I80299 Phlebitis and thrombophlebitis of other deep vessels of unspecified lower extremity: Secondary | ICD-10-CM

## 2012-10-15 MED ORDER — RIVAROXABAN 20 MG PO TABS: 20.0000 mg | ORAL_TABLET | Freq: Every day | ORAL | Status: DC

## 2012-10-15 MED ORDER — OXYCODONE HCL 5 MG PO TABS: 5.0000 mg | ORAL_TABLET | ORAL | Status: DC | PRN

## 2012-10-15 MED ORDER — SERTRALINE HCL 50 MG PO TABS: 50.0000 mg | ORAL_TABLET | Freq: Every day | ORAL | Status: DC

## 2012-10-15 MED ORDER — FAMOTIDINE 20 MG PO TABS: 20.0000 mg | ORAL_TABLET | Freq: Two times a day (BID) | ORAL | Status: DC

## 2012-10-15 MED ORDER — DOXYCYCLINE HYCLATE 100 MG PO TABS: 100.0000 mg | ORAL_TABLET | Freq: Two times a day (BID) | ORAL | Status: DC

## 2012-10-15 MED ORDER — CALCIUM CARBONATE ANTACID 500 MG PO CHEW: 1.0000 | CHEWABLE_TABLET | Freq: Four times a day (QID) | ORAL | Status: DC

## 2012-10-15 MED ORDER — ADULT MULTIVITAMIN W/MINERALS CH: 1.0000 | ORAL_TABLET | Freq: Every day | ORAL | Status: DC

## 2012-10-15 MED ORDER — METOPROLOL TARTRATE 25 MG PO TABS: 37.5000 mg | ORAL_TABLET | Freq: Two times a day (BID) | ORAL | Status: DC

## 2012-10-15 MED ORDER — ALPRAZOLAM 0.25 MG PO TABS: 0.2500 mg | ORAL_TABLET | Freq: Three times a day (TID) | ORAL | Status: DC | PRN

## 2012-10-15 MED ORDER — RISAQUAD PO CAPS: 1.0000 | ORAL_CAPSULE | Freq: Two times a day (BID) | ORAL | Status: DC

## 2012-10-15 NOTE — Progress Notes (Signed)
Patient and husband received discharge instructions from [name], [name].  All questions answered.  Patient discharged home with family.  Patient escorted via wheelchair by Truddie Crumble, NT to family's vehicle.

## 2012-10-15 NOTE — Progress Notes (Signed)
Patient ID: [name], female   DOB: 18-Apr-1956, 57 y.o.   MRN: [id number] Subjective/Complaints:  no complaints A 12 point review of systems has been performed and if not noted above is otherwise negative.   Objective: Vital Signs: Blood pressure 105/62, pulse 80, temperature 97.4 F (36.3 C), temperature source Oral, resp. rate 19, height 5\' 7"  (1.702 m), weight 102.5 kg (225 lb 15.5 oz), SpO2 96.00%. No results found.  Recent Labs  10/13/12 0500  WBC 6.2  HGB 11.2*  HCT 34.1*  PLT 354    Recent Labs  10/13/12 0500  NA 141  K 4.2  CL 104  GLUCOSE 107*  BUN 7  CREATININE 0.54  CALCIUM 9.6   CBG (last 3)  No results found for this basename: GLUCAP,  in the last 72 hours  Wt Readings from Last 3 Encounters:  10/01/12 102.5 kg (225 lb 15.5 oz)  09/23/12 113.399 kg (250 lb)  08/11/12 168.9 kg (372 lb 5.7 oz)    Physical Exam:  Constitutional: She is oriented to person, place, and time. She appears well-developed. obese  HENT:  Head: Normocephalic, oral mucosa pink and moist.  Eyes:  Pupils round and reactive to light  Neck: Normal range of motion. Neck supple. No thyromegaly present.  Cardiovascular: Normal rate and regular rhythm. No murmurs.  Pulmonary/Chest: Effort normal and breath sounds normal. No respiratory distress. She has no wheezes.  Abdominal: Soft. Bowel sounds are normal. She exhibits no distension. There is no tenderness.  Musculoskeletal:  Trace to 1+ edema lower extremities.  NEURO: Decreased sensation to light touch bilaterally at toes. UE 4+/5 deltoids, biceps, triceps, WE, HI. LE 4 HF, 4+ KE, 4+/5 ADF and APF. DTR'S are 1+.  She is alert and oriented to person, place, and time.  Follows full commands. Has excellent insight and awareness.  Skin: Skin is warm and dry. R upper back  Area dressed. No blood through dressing at present.Psychiatric: She has a normal mood and affect.    Assessment/Plan: 1. 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 Physiatrist is providing close team supervision and 24 hour management of active medical problems listed below. Physiatrist and rehab team continue to assess barriers to discharge/monitor patient progress toward functional and medical goals.   Dc home. Goals met hh therapies. Rn.  Follow up with me in about a month FIM: FIM - Bathing Bathing Steps Patient Completed: Chest;Right Arm;Left Arm;Abdomen;Front perineal area;Buttocks;Right upper leg;Left upper leg;Right lower leg (including foot);Left lower leg (including foot) Bathing: 6: Assistive device (Comment)  FIM - Upper Body Dressing/Undressing Upper body dressing/undressing steps patient completed: Thread/unthread right bra strap;Thread/unthread left bra strap;Hook/unhook bra;Thread/unthread right sleeve of pullover shirt/dresss;Thread/unthread left sleeve of pullover shirt/dress;Put head through opening of pull over shirt/dress;Pull shirt over trunk Upper body dressing/undressing: 7: Complete Independence: No helper FIM - Lower Body Dressing/Undressing Lower body dressing/undressing steps patient completed: Thread/unthread right underwear leg;Thread/unthread left underwear leg;Pull underwear up/down;Thread/unthread right pants leg;Thread/unthread left pants leg;Pull pants up/down;Fasten/unfasten pants;Don/Doff right sock;Don/Doff left sock;Don/Doff right shoe;Don/Doff left shoe;Fasten/unfasten right shoe;Fasten/unfasten left shoe Lower body dressing/undressing: 6: Assistive device (Comment)  FIM - Toileting Toileting steps completed by patient: Adjust clothing prior to toileting;Performs perineal hygiene;Adjust clothing after toileting Toileting Assistive Devices: Grab bar or rail for support Toileting: 6: Assistive device: No helper  FIM - Scientist, research (physical sciences) Devices: Elevated toilet Photographer  Transfers: 6-Assistive device: No helper  FIM - Banker Devices: Therapist, occupational: 6: Assistive device: no helper  FIM - Locomotion: Wheelchair Distance: >150' Locomotion: Wheelchair: 6: Travels 150 ft or more, turns around, maneuvers to table, bed or toilet, negotiates 3% grade: maneuvers on rugs and over door sills independently FIM - Locomotion: Ambulation Locomotion: Ambulation Assistive Devices: Designer, industrial/product Ambulation/Gait Assistance: 6: Modified independent (Device/Increase time) Locomotion: Ambulation: 6: Travels 150 ft or more independently/takes more than reasonable amount of time  Comprehension Comprehension Mode: Auditory Comprehension: 7-Follows complex conversation/direction: With no assist  Expression Expression Mode: Verbal Expression: 7-Expresses complex ideas: With no assist  Social Interaction Social Interaction: 7-Interacts appropriately with others - No medications needed.  Problem Solving Problem Solving: 7-Solves complex problems: Recognizes & self-corrects  Memory Memory: 7-Complete Independence: No helper  Medical Problem List and Plan:  1. 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 DVT Prophylaxis/Anticoagulation:   Venous dopplers show extensive bilateral femoral, popliteal DVT's. Xarelto initiated. All activities resumed yesterday without issue --favor 12 months of therapy  3. Mood: Zoloft 25 mg daily. Provide emotional support and positive reinforcement . In good spirits considering the situation. 4. Neuropsych: This patient is capable of making decisions on her own behalf.  5. Pulmonary. Continue oxygen as needed at 1-2 L.  -It is possible that she may have embolized a small clot to her lungs    -will need oxygen at dc 6. Acute renal failure-resolved.  BUN and CR wnl 7. Clostridium difficile  diarrhea.resolved 8. Hypertension. Lasix 40 mg daily, hydralazine stopped. bp normotensive to hypotensive at times.   -decreased lasix to 20mg --dc entirely 9.  Large cyst with infection/puss on back/ s/p cleanout, debridement,   -doycycline  -wet to dry packing- education provided  -appreciate general surgery help  LOS (Days) 21 A FACE TO FACE EVALUATION WAS PERFORMED  SWARTZ,ZACHARY T 10/15/2012 8:26 AM

## 2012-10-15 NOTE — Progress Notes (Signed)
Social Work  Discharge Note  The overall goal for the admission was met for:   Discharge location: Yes - home with husband and family  Length of Stay: Yes - 23 days  Discharge activity level: Yes - supervision overall    Home/community participation: Yes  Services provided included: MD, RD, PT, OT, SLP, RN, TR, Pharmacy, Neuropsych and SW  Financial Services: Private Insurance: [employer]  Follow-up services arranged: Home Health: RN, PT via [facility], DME: 20x18 ltwt. w/c, cushion, 3n1 commode, hospital bed, rolling walker and oxygen via [facility] and Patient/Family has no preference for HH/DME agencies  Comments (or additional information):  Patient/Family verbalized understanding of follow-up arrangements: Yes  Individual responsible for coordination of the follow-up plan: patient  Confirmed correct DME delivered: Ruperto Kiernan 10/15/2012    Lunabella Badgett

## 2012-10-15 NOTE — Progress Notes (Signed)
Patient ID: [name], female   DOB: [birth year], 57 y.o.   MRN: [id number]    Subjective: Pt feels well.  No c/o  Objective: Vital signs in last 24 hours: Temp:  [97.4 F (36.3 C)-98.1 F (36.7 C)] 97.4 F (36.3 C) (03/12 0525) Pulse Rate:  [72-89] 80 (03/12 0525) Resp:  [19-20] 19 (03/12 0525) BP: (93-110)/(54-62) 105/62 mmHg (03/12 0525) SpO2:  [83 %-97 %] 96 % (03/12 0525) Last BM Date: 10/13/12 (per report)  Intake/Output from previous day: 03/11 0701 - 03/12 0700 In: 1320 [P.O.:1320] Out: -  Intake/Output this shift:    PE: Skin: wound is clean with no drainage.  No erythema.  Dressing change performed.  No bleeding after cauterization yesterday.  Lab Results:   Recent Labs  10/13/12 0500  WBC 6.2  HGB 11.2*  HCT 34.1*  PLT 354   BMET  Recent Labs  10/13/12 0500  NA 141  K 4.2  CL 104  CO2 26  GLUCOSE 107*  BUN 7  CREATININE 0.54  CALCIUM 9.6   PT/INR No results found for this basename: LABPROT, INR,  in the last 72 hours CMP     Component Value Date/Time   NA 141 10/13/2012 0500   K 4.2 10/13/2012 0500   CL 104 10/13/2012 0500   CO2 26 10/13/2012 0500   GLUCOSE 107* 10/13/2012 0500   BUN 7 10/13/2012 0500   CREATININE 0.54 10/13/2012 0500   CALCIUM 9.6 10/13/2012 0500   PROT 5.8* 09/25/2012 0645   ALBUMIN 2.8* 09/25/2012 0645   AST 29 09/25/2012 0645   ALT 62* 09/25/2012 0645   ALKPHOS 161* 09/25/2012 0645   BILITOT 0.3 09/25/2012 0645   GFRNONAA >90 10/13/2012 0500   GFRAA >90 10/13/2012 0500   Lipase     Component Value Date/Time   LIPASE 90* 08/05/2012 2223       Studies/Results: No results found.  Anti-infectives: Anti-infectives   Start     Dose/Rate Route Frequency Ordered Stop   10/14/12 0900  doxycycline (VIBRA-TABS) tablet 100 mg     100 mg Oral Every 12 hours 10/14/12 0829     10/13/12 1800  vancomycin (VANCOCIN) IVPB 1000 mg/200 mL premix  Status:  Discontinued     1,000 mg 200 mL/hr over 60 Minutes Intravenous Every 12 hours  10/13/12 1710 10/14/12 0828   10/12/12 1400  ceFAZolin (ANCEF) IVPB 1 g/50 mL premix  Status:  Discontinued     1 g 100 mL/hr over 30 Minutes Intravenous 3 times per day 10/12/12 1004 10/13/12 1709   10/11/12 0945  cephALEXin (KEFLEX) capsule 500 mg  Status:  Discontinued     500 mg Oral 3 times per day 10/11/12 0941 10/12/12 1004       Assessment/Plan  1. Infected sebaceous cyst of right upper back, s/p I&D  Plan: 1. Ok for [employer] home.  5 more days of oral doxy 2. RTC in 2 weeks with [place] for a wound check 3. BID dressing changes at home to wound, HH set up as needed.   LOS: 21 days    OSBORNE,KELLY E 10/15/2012, 8:07 AM Pager: [phone]

## 2012-10-15 NOTE — Discharge Summary (Signed)
Discharge addendum. After review of medications and close monitoring of patient's normotensive hypotensive episodes she was slowly weaned off of her Lasix therapy. She was on Lopressor twice daily with heart rates monitored and controlled again with episodes of hypotension this was discontinued. She was on lisinopril 10 mg prior to admission this had not been resumed during her hospital stay due to acute renal failure which essentially resolved the latest creatinine within acceptable limits. It was felt lisinopril could be resumed and followup with her primary care provider in approximately one week for repeat chemistries and monitoring of blood pressure.

## 2012-10-16 LAB — CULTURE, ROUTINE-ABSCESS

## 2012-10-28 ENCOUNTER — Encounter (INDEPENDENT_AMBULATORY_CARE_PROVIDER_SITE_OTHER): Payer: Self-pay

## 2012-10-28 ENCOUNTER — Telehealth (INDEPENDENT_AMBULATORY_CARE_PROVIDER_SITE_OTHER): Payer: Self-pay | Admitting: General Surgery

## 2012-10-28 ENCOUNTER — Ambulatory Visit (INDEPENDENT_AMBULATORY_CARE_PROVIDER_SITE_OTHER): Payer: Managed Care, Other (non HMO) | Admitting: General Surgery

## 2012-10-28 VITALS — BP 122/86 | HR 96 | Temp 96.6°F | Ht 67.0 in | Wt 234.2 lb

## 2012-10-28 DIAGNOSIS — L723 Sebaceous cyst: Secondary | ICD-10-CM

## 2012-10-28 NOTE — Progress Notes (Signed)
Subjective:     Patient ID: [name], female   DOB: [birth year], 56 y.o.   MRN: [id number]  HPI  57 year old female presents for a for a follow up, s/p I&D for right scapular sebaceous cyst.  She denies fever, chills or further drainage.     Review of Systems  Constitutional: Negative for fever and chills.  Skin: Negative for color change, pallor and rash.       Objective:   Physical Exam  Skin:          Assessment:     Infected sebaceous cyst, s/p I&D    Plan:     Improving, continue with wet to dry dressings twice daily.  Follow up in clinic in 2 weeks or sooner should new or concerning symptoms develop.

## 2012-10-28 NOTE — Patient Instructions (Signed)
Continue with wet to dry dressings twice daily.  Follow up in 2 weeks or sooner should drainage, fever or chills develop.

## 2012-10-29 ENCOUNTER — Encounter: Payer: Self-pay | Admitting: Physical Medicine & Rehabilitation

## 2012-10-29 ENCOUNTER — Encounter
Payer: Managed Care, Other (non HMO) | Attending: Physical Medicine & Rehabilitation | Admitting: Physical Medicine & Rehabilitation

## 2012-10-29 VITALS — BP 131/74 | HR 97 | Resp 16 | Ht 67.0 in | Wt 235.0 lb

## 2012-10-29 DIAGNOSIS — I10 Essential (primary) hypertension: Secondary | ICD-10-CM | POA: Insufficient documentation

## 2012-10-29 DIAGNOSIS — R059 Cough, unspecified: Secondary | ICD-10-CM | POA: Insufficient documentation

## 2012-10-29 DIAGNOSIS — R05 Cough: Secondary | ICD-10-CM | POA: Insufficient documentation

## 2012-10-29 DIAGNOSIS — G7281 Critical illness myopathy: Secondary | ICD-10-CM

## 2012-10-29 DIAGNOSIS — I2699 Other pulmonary embolism without acute cor pulmonale: Secondary | ICD-10-CM

## 2012-10-29 NOTE — Patient Instructions (Signed)
CONTINUE WORKING ON STAMINA AND ENDURANCE AT HOME  CONTINUE YOUR XARELTO (ANTICOAGULANT) FOR ONE YEAR FROM START DATE  CALL ME WITH ANY PROBLEMS OR QUESTIONS (#[phone]).  HAVE A GOOD DAY

## 2012-10-29 NOTE — Progress Notes (Signed)
Subjective:    Patient ID: Marguerite Barba, female    DOB: [birth year], 56 y.o.   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 She is working with PT still at home. She is working on pulmonary stamina. She is still having to use oxygen for longer distances. She is noting a cough which has been persistent since going home.  Mrs. Dassow would like to drive. She wasn't working PTA but was caring for her 5 year old grandson which required frequent "running around".  Pain Inventory Average Pain 4 Pain Right Now 4 My pain is aching  In the last 24 hours, has pain interfered with the following? General activity 0 Relation with others 0 Enjoyment of life 0 What TIME of day is your pain at its worst? morning Sleep (in general) Good  Pain is worse with: walking, bending, sitting and standing Pain improves with: rest and medication Relief from Meds: 7  Mobility use a cane ability to climb steps?  yes  Function not employed: date last employed not employed  Neuro/Psych No problems in this area  Prior Studies Any changes since last visit?  no  Physicians involved in your care Any changes since last visit?  no   Family History  Problem Relation Age of Onset  . Hypertension Father   . Heart disease Father    History   Social History  . Marital Status: Married    Spouse Name: N/A    Number of Children: N/A  . Years of Education: N/A   Social History Main Topics  . Smoking status: Never Smoker   . Smokeless tobacco: None  . Alcohol Use: No  . Drug Use: No  . Sexually Active: Yes    Birth Control/ Protection: None   Other Topics Concern  . None   Social History Narrative  . None   Past Surgical History  Procedure Laterality Date  . Cyst excision  1980    Spinal cyst  . Tonsillectomy  1980  . Knee arthroscopy      bilateral   Past Medical History  Diagnosis Date  . Hypertension    BP 131/74  Pulse 97  Resp 16  Ht 5\' 7"  (1.702  m)  Wt 235 lb (106.595 kg)  BMI 36.8 kg/m2  SpO2 96%   Review of Systems  Musculoskeletal: Positive for arthralgias and gait problem.  All other systems reviewed and are negative.       Objective:   Physical Exam  General: Alert and oriented x 3, No apparent distress HEENT: Head is normocephalic, atraumatic, PERRLA, EOMI, sclera anicteric, oral mucosa pink and moist, dentition intact, ext ear canals clear,  Neck: Supple without JVD or lymphadenopathy Heart: Reg rate and rhythm. No murmurs rubs or gallops Chest: CTA bilaterally without wheezes, rales, or rhonchi; no distress Abdomen: Soft, non-tender, non-distended, bowel sounds positive. Extremities: No clubbing, cyanosis, or edema. Pulses are 2+ Skin: Clean and intact without signs of breakdown Neuro: Pt is cognitively appropriate with normal insight, memory, and awareness. Cranial nerves 2-12 are intact. Sensory exam is normal. Reflexes are 2+ in all 4's. Fine motor coordination is intact. No tremors. Motor function is grossly 5/5.  Musculoskeletal: Full ROM, No pain with AROM or PROM in the neck, trunk, or extremities. Posture appropriate Psych: Pt's affect is appropriate. Pt is cooperative             1. 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 2. Chronic  cough--likely ACE effect 3. Likely pulmonary embolus   Plan: 1. Continue with hh PT to work on stamina and endurance. I think she can pursue further exercise on her own once this is completed. I don't feel that a formal pulmonary rehab program is necessary. Continue with oxygen as needed until she is able to completely wean off.  2. Recommend one full year of anticoagulation for PE (xarelto) 3. Gave her permission to drive. 4. I will see her back on a prn basis. 30 minutes of face to face patient care time were spent during this visit. All questions were encouraged and answered. 5. Discontinue lisinopril as her cough is likely drug related.  Furthermore, her bp is well controlled and has actually been low at times.

## 2012-11-11 ENCOUNTER — Ambulatory Visit (INDEPENDENT_AMBULATORY_CARE_PROVIDER_SITE_OTHER): Payer: Managed Care, Other (non HMO) | Admitting: General Surgery

## 2012-11-11 ENCOUNTER — Encounter (INDEPENDENT_AMBULATORY_CARE_PROVIDER_SITE_OTHER): Payer: Self-pay

## 2012-11-11 VITALS — BP 130/84 | HR 80 | Temp 97.1°F | Resp 18 | Ht 67.0 in | Wt 228.8 lb

## 2012-11-11 DIAGNOSIS — L723 Sebaceous cyst: Secondary | ICD-10-CM

## 2012-11-11 NOTE — Progress Notes (Signed)
Subjective:     Patient ID: [name], female   DOB: Jun 13, 1956, 57 y.o.   MRN: [id number]  HPI 57 year old female presents for a follow up.  S/p I&D for right scapular sebaceous cyst.  She denies fever, chills or further drainage.  Denies pain.  Well healed incision.  Review of Systems  Constitutional: Negative for fever and chills.  Skin: Negative for color change, pallor, rash and wound.       Objective:   Physical Exam  Vitals reviewed. Skin: No rash noted. No erythema. No pallor.  Right scapular incision-well healed       Assessment:    Infected sebaceous cyst, s/p I&D 09/24/12    Plan:     Incision healed.  Self care measures reviewed.  Follow up as needed.

## 2012-11-11 NOTE — Patient Instructions (Signed)
You may cover with band aid or 2x2 gauze and tape to prevent bra strap from irritating incision.  No restrictions. Please call the clinic with any fever, chills, drainage or redness at site.   No further follow up unless warranted.

## 2012-12-01 ENCOUNTER — Other Ambulatory Visit: Payer: Self-pay | Admitting: Family Medicine

## 2012-12-01 ENCOUNTER — Ambulatory Visit
Admission: RE | Admit: 2012-12-01 | Discharge: 2012-12-01 | Disposition: A | Discharge status: Home / Self Care | Payer: Managed Care, Other (non HMO) | Source: Ambulatory Visit | Attending: Family Medicine | Admitting: Family Medicine

## 2012-12-01 DIAGNOSIS — J40 Bronchitis, not specified as acute or chronic: Secondary | ICD-10-CM

## 2013-01-12 NOTE — Telephone Encounter (Signed)
Error

## 2013-01-27 ENCOUNTER — Telehealth: Payer: Self-pay | Admitting: Hematology & Oncology

## 2013-01-27 NOTE — Telephone Encounter (Signed)
Left pt message to call and schedule appointment °

## 2013-01-28 ENCOUNTER — Telehealth: Payer: Self-pay | Admitting: Hematology & Oncology

## 2013-01-28 NOTE — Telephone Encounter (Signed)
Left pt message to call and schedule appointment °

## 2013-01-29 ENCOUNTER — Telehealth: Payer: Self-pay | Admitting: Hematology & Oncology

## 2013-01-29 NOTE — Telephone Encounter (Signed)
Pt aware of 8-1 appointment. I offered her a sooner date but the time between appointments wasn't ok for her.

## 2013-02-18 ENCOUNTER — Encounter: Payer: Self-pay | Admitting: Internal Medicine

## 2013-02-18 ENCOUNTER — Ambulatory Visit (INDEPENDENT_AMBULATORY_CARE_PROVIDER_SITE_OTHER): Payer: Managed Care, Other (non HMO) | Admitting: Internal Medicine

## 2013-02-18 VITALS — BP 130/80 | HR 74 | Ht 67.0 in | Wt 239.0 lb

## 2013-02-18 DIAGNOSIS — Z930 Tracheostomy status: Secondary | ICD-10-CM

## 2013-02-18 DIAGNOSIS — J9600 Acute respiratory failure, unspecified whether with hypoxia or hypercapnia: Secondary | ICD-10-CM

## 2013-02-18 DIAGNOSIS — I2699 Other pulmonary embolism without acute cor pulmonale: Secondary | ICD-10-CM

## 2013-02-18 DIAGNOSIS — Z9889 Other specified postprocedural states: Secondary | ICD-10-CM

## 2013-02-18 DIAGNOSIS — J42 Unspecified chronic bronchitis: Secondary | ICD-10-CM

## 2013-02-18 DIAGNOSIS — J9601 Acute respiratory failure with hypoxia: Secondary | ICD-10-CM

## 2013-02-18 DIAGNOSIS — J9610 Chronic respiratory failure, unspecified whether with hypoxia or hypercapnia: Secondary | ICD-10-CM

## 2013-02-18 MED ORDER — TIOTROPIUM BROMIDE MONOHYDRATE 18 MCG IN CAPS: 18.0000 ug | ORAL_CAPSULE | Freq: Every day | RESPIRATORY_TRACT | Status: DC

## 2013-02-18 NOTE — Patient Instructions (Addendum)
Order- PFT  And 6 MWT     Dx chronic bronchitis, chronic respiratory failure  I encourage you to be active- walking or any other activity that gets you moving so you rebuild some stamina  Sample Spiriva    1 daily

## 2013-02-18 NOTE — Progress Notes (Signed)
02/18/13- 57 yoF never smoker referred courtesy of Dr Tiburcio Pea. 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  does wear O2 at home; reports still having DOE but overall doing much better since discharge from [facility]. Hosp 08/05/12- 08/11/12- ARDS due to H1N1 flu, complicated by AKI requiring CVVHD, PE on Xarelto. Denies prior hx allergy, pneumonia, asthma or heart disease.  Now on 1 oxygen 1 L for sleep and as needed/[employer]. Dry cough. Little need for rescue inhaler. Humid weather bothers her. 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 CXR 12/01/12 IMPRESSION:  Diffuse pulmonary parenchymal coarsening with right perihilar  architectural distortion. Findings are indicative of chronic lung  disease, possibly interstitial in nature. High-resolution chest CT  would likely be helpful in further evaluation, as clinically  indicated.  Original Report Authenticated By: Leanna Battles, M.D.  Prior to Admission medications   Medication Sig Start Date End Date Taking? Authorizing Provider  albuterol (PROAIR HFA) 108 (90 BASE) MCG/ACT inhaler Inhale 2 puffs into the lungs every 4 (four) hours as needed for wheezing or shortness of breath.   Yes Historical Provider, MD  metoprolol succinate (TOPROL-XL) 50 MG 24 hr tablet Take 50 mg by mouth daily. Take with or immediately following a meal.   Yes Historical Provider, MD  Rivaroxaban (XARELTO) 20 MG TABS Take 1 tablet (20 mg total) by mouth daily. 10/16/12  Yes Daniel J Angiulli, PA-C  sertraline (ZOLOFT) 50 MG tablet Take 1 tablet (50 mg total) by mouth at bedtime. 10/15/12  Yes Daniel J Angiulli, PA-C  tiotropium (SPIRIVA) 18 MCG inhalation capsule Place 1 capsule (18 mcg total) into inhaler and inhale daily. 02/18/13   Waymon Budge, MD   Past Medical History  Diagnosis Date  .  Hypertension    Past Surgical History  Procedure Laterality Date  . Cyst excision  1980    Spinal cyst  . Tonsillectomy  1980  . Knee arthroscopy      bilateral   Family History  Problem Relation Age of Onset  . Heart disease Father   . Diabetes Father   . Heart disease Mother   . Hypertension Mother   . Hypertension Brother   . Arthritis Mother   . Arthritis Maternal Grandmother    History   Social History  . Marital Status: Married    Spouse Name: N/A    Number of Children: N/A  . Years of Education: N/A   Occupational History  . Not on file.   Social History Main Topics  . Smoking status: Never Smoker   . Smokeless tobacco: Never Used  . Alcohol Use: No  . Drug Use: No  . Sexually Active: Yes    Birth Control/ Protection: None   Other Topics Concern  . Not on file   Social History Narrative   Married   3 children   4 yo grandson who lives with them most of the time   ROS-see HPI Constitutional:   No-   weight loss, night sweats, fevers, chills, fatigue, lassitude. HEENT:   + headaches, difficulty swallowing, tooth/dental problems, sore throat,       +sneezing, no-itching, ear ache, +nasal congestion, post nasal drip,  CV:  No-   chest pain, orthopnea, PND, swelling in lower extremities, anasarca, dizziness, +palpitations Resp: + shortness of breath with exertion or at rest.  No-   productive cough,  + non-productive cough,  No- coughing up of blood.              No-   change in color of mucus.  No- wheezing.   Skin: No-   rash or lesions. GI:  No-   heartburn, indigestion, abdominal pain, nausea, vomiting, diarrhea,                 change in bowel habits, loss of appetite GU: No-   dysuria, change in color of urine, no urgency or frequency.  No- flank pain. MS:  + joint pain or swelling.  No- decreased range of motion.  No- back pain. Neuro-     nothing unusual Psych:  No- change in mood or affect. + anxiety.  No memory loss.  OBJ-  Physical Exam General- Alert, Oriented, Affect-appropriate, Distress- none acute Skin- rash-none, lesions- none, excoriation- none Lymphadenopathy- none Head- atraumatic            Eyes- Gross vision intact, PERRLA, conjunctivae and secretions clear            Ears- Hearing, canals-normal            Nose- Clear, no-Septal dev, mucus, polyps, erosion, perforation             Throat- Mallampati II , mucosa clear , drainage- none, tonsils- atrophic. +dental repair Neck- +tracheostomy scar, no stridor , thyroid nl, carotid no bruit Chest - symmetrical excursion , unlabored           Heart/CV- RRR , no murmur , no gallop  , no rub, nl s1 s2                           - JVD- none , edema- none, stasis changes- none, varices- none           Lung- clear to P&A, wheeze- none, cough- none , dullness-none, rub- none           Chest wall-  Abd- tender-no, distended-no, bowel sounds-present, HSM- no Br/ Gen/ Rectal- Not done, not indicated Extrem- cyanosis- none, clubbing, none, atrophy- none, strength- nl Neuro- grossly intact to observation

## 2013-03-06 ENCOUNTER — Ambulatory Visit (HOSPITAL_BASED_OUTPATIENT_CLINIC_OR_DEPARTMENT_OTHER): Payer: Managed Care, Other (non HMO) | Admitting: Hematology & Oncology

## 2013-03-06 ENCOUNTER — Other Ambulatory Visit: Payer: Managed Care, Other (non HMO) | Admitting: Lab

## 2013-03-06 ENCOUNTER — Ambulatory Visit: Payer: Managed Care, Other (non HMO)

## 2013-03-06 VITALS — BP 134/81 | HR 74 | Temp 97.9°F | Resp 20 | Ht 66.0 in | Wt 238.0 lb

## 2013-03-06 DIAGNOSIS — I82403 Acute embolism and thrombosis of unspecified deep veins of lower extremity, bilateral: Secondary | ICD-10-CM

## 2013-03-06 DIAGNOSIS — I82409 Acute embolism and thrombosis of unspecified deep veins of unspecified lower extremity: Secondary | ICD-10-CM

## 2013-03-06 NOTE — Progress Notes (Signed)
CC:   Holley Bouche, M.D.  DIAGNOSES: 1. 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 2. Possible pulmonary emboli.  HISTORY OF PRESENT ILLNESS:  [name] is an incredibly nice 57 year old white female.  She has an amazing history.  She has been in very good health.  Everything was going on very well for her until New Year's Eve. At that point in time, she was admitted for H1N1 infection and rapid- developing ARDS.  It was about a week prior to this that she began to have some symptoms.  She had a sore throat.  She had a little bit of congestion.  Cough continued.  She then became weak.  She had shortness of breath.  Her oxygen  saturation was found to be in the 60s when the paramedics came.  She was brought to [facility].  She was admitted.  She deteriorated. She was placed on ventilation.  She was then transferred over to [facility].  They apparently had a special protocol for ARDS.  She subsequently was at [facility] for awhile.  She gradually improved.  She was then transferred to long-term care hospital in [place].  I think this was probably back in January or February.  She then was able to go to inpatient rehab.  To further clarify her stay at [facility], she was evaluated for [facility].  She has not felt to be a candidate.  However, she did get some hemodialysis while she was over there.  Again, she was sent to inpatient rehab at [facility] on February 19th.  She did very well.  She was on DVT prophylaxis.  However, she was found to have bilateral DVTs.  She had a thrombus in the right common femoral vein extending down to the peroneal vein.  On the left side she had a thrombus in the left common femoral vein extending down into the profunda femoris vein.  She was fully anticoagulated.  She had been placed on Xarelto.  She looks great.  She was discharged on March 12th.  The patient is followed by Dr. Leonides Sake.  Dr.  Tiburcio Pea was not confident in how long she needed to be anticoagulated.  There was a question of her possibly having some small pulmonary emboli. I am sure if this was ever fully documented.  She denies any cough or shortness of breath.  There is no chest wall pain.  There is no bleeding or bruising.  She has had no abdominal pain. There has been no change in bowel or bladder habits.  She feels quite well.  Overall performance status is ECOG 0.  PAST MEDICAL HISTORY:  Relatively unremarkable.  ALLERGIES: 1. Aspirin. 2. Sulfa antibiotics.  MEDICATIONS:  Xarelto 20 mg p.o. daily, Toprol-XL 50 mg p.o. daily, Zoloft 50 mg p.o. q.h.s., Spiriva 18 mcg inhaler daily, albuterol inhaler 2 puffs q.4 hours p.r.n.  SOCIAL HISTORY:  Negative for tobacco or alcohol use.  There are no obvious occupational exposures.  FAMILY HISTORY:  Negative for any history of blood clots.  REVIEW OF SYSTEMS:  As stated in history present illness.  No additional findings are noted on a 12-system review.  PHYSICAL EXAMINATION:  General:  This is a well-developed, well- nourished white female in no obvious distress.  Vital signs: Temperature of 97.9, pulse 74, respiratory rate 20, blood pressure 134/81.  Weight is 238.  Head and neck:  Normocephalic, atraumatic skull.  There are no ocular or oral lesions.  There  are no palpable cervical or supraclavicular lymph nodes.  Lungs:  Clear bilaterally. Cardiac:  Regular rate and rhythm with a normal S1, S2.  There are no murmurs, rubs or bruits.  Abdomen:  Soft.  She has good bowel sounds. There is no fluid wave.  There is no palpable hepatosplenomegaly.  Back: No tenderness over the spine, ribs, or hips.  Extremities:  Show no clubbing, cyanosis or edema.  No palpable venous cord is noted in the legs.  She has good range motion of her joints.  She has good pulses in her distal extremities.  Neurological:  Shows no focal neurological deficits.  LABORATORY  STUDIES:  Not done this visit.  IMPRESSION:  [name] is a very charming 57 year old white female with a history of H1N1 virus.  She survived this.  She was incredibly sick. Only by the grace of God did she survive.  She said that during her episode in the ICU, she just had a peace about her and that she knew that she was going to make it through the crisis.  As far as anticoagulation goes for her, I believe that a year would be recommended.  I know there is some controversy regarding this.  I do not believe she has any hypercoagulable state.  I do not believe she has any thrombophilic hereditary state.  I do not see that we need to do a hypercoagulable panel on her as the thrombus clearly was related to her being severely ill and basically immobilized for 2 months or so. Even though she was on prophylactic anticoagulation, she still was able to develop a thrombus.  It is unclear as to whether or not she had any pulmonary embolism.  I do not see that we need to check this.  I do believe that we have to check her legs.  I want to make sure that the thrombus in her legs has resolved.  We will get this set up next week.  I would like to see [name] back in about 6 weeks.  At that point time, if all looks well, then we can probably get her back in 3 to 4 months.  She will complete a year of anticoagulation in February 2015.  I spent a good hour or so with Ms. Zuno and her husband.  We had an excellent fellowship.  We shared scripture and it was nice to be inspired by her story and by her courage.    ______________________________ Josph Macho, M.D. PRE/MEDQ  D:  03/06/2013  T:  03/06/2013  Job:  [id number]

## 2013-03-06 NOTE — Progress Notes (Signed)
This office note has been dictated.

## 2013-03-07 NOTE — Assessment & Plan Note (Signed)
Oxygen still helps some but she may get to the point where she doesn't need it. Plan-schedule PFT with 6 minute walk test. Try sample Spiriva

## 2013-03-07 NOTE — Assessment & Plan Note (Signed)
Healed without stridor

## 2013-03-07 NOTE — Assessment & Plan Note (Signed)
Continues Xarelto. She says Dr. Tiburcio Pea is managing this.

## 2013-03-09 ENCOUNTER — Ambulatory Visit (HOSPITAL_BASED_OUTPATIENT_CLINIC_OR_DEPARTMENT_OTHER)
Admission: RE | Admit: 2013-03-09 | Discharge: 2013-03-09 | Disposition: A | Discharge status: Home / Self Care | Payer: Managed Care, Other (non HMO) | Source: Ambulatory Visit | Attending: Hematology & Oncology | Admitting: Hematology & Oncology

## 2013-03-09 DIAGNOSIS — I82403 Acute embolism and thrombosis of unspecified deep veins of lower extremity, bilateral: Secondary | ICD-10-CM

## 2013-03-09 DIAGNOSIS — I82409 Acute embolism and thrombosis of unspecified deep veins of unspecified lower extremity: Secondary | ICD-10-CM | POA: Insufficient documentation

## 2013-03-09 DIAGNOSIS — Z7901 Long term (current) use of anticoagulants: Secondary | ICD-10-CM | POA: Insufficient documentation

## 2013-03-12 ENCOUNTER — Ambulatory Visit (INDEPENDENT_AMBULATORY_CARE_PROVIDER_SITE_OTHER): Payer: Managed Care, Other (non HMO) | Admitting: Internal Medicine

## 2013-03-12 DIAGNOSIS — R0602 Shortness of breath: Secondary | ICD-10-CM

## 2013-03-12 DIAGNOSIS — J9610 Chronic respiratory failure, unspecified whether with hypoxia or hypercapnia: Secondary | ICD-10-CM

## 2013-03-12 LAB — PULMONARY FUNCTION TEST

## 2013-03-12 NOTE — Progress Notes (Signed)
PFT Done. Joelynn Dust, CMA  

## 2013-03-13 ENCOUNTER — Ambulatory Visit: Payer: Managed Care, Other (non HMO)

## 2013-03-13 ENCOUNTER — Encounter: Payer: Self-pay | Admitting: *Deleted

## 2013-03-20 ENCOUNTER — Encounter: Payer: Self-pay | Admitting: Internal Medicine

## 2013-03-24 NOTE — Progress Notes (Signed)
Documentation for 6 minute walk test 

## 2013-03-27 ENCOUNTER — Telehealth: Payer: Self-pay | Admitting: Internal Medicine

## 2013-03-27 NOTE — Telephone Encounter (Signed)
I spoke with pt and she is requesting her PFT and results. She is aware CDY is not back until next week. Please advise thanks

## 2013-03-30 NOTE — Telephone Encounter (Signed)
I spoke with patient about results and she verbalized understanding. She stated she has not noticed the spiriva helped for the 10 day trial. It also caused her cough to become worse. She can feel the phlem wanting to come up but she doesn't cough anything up. She is wanting to know if she needs to change to something different. Please advise Dr. Maple Hudson thanks  Allergies  Allergen Reactions  . Aspirin Nausea And Vomiting  . Sulfa Antibiotics Nausea Only

## 2013-03-30 NOTE — Telephone Encounter (Signed)
Per CY-see if we can schedule routine OV.

## 2013-03-30 NOTE — Telephone Encounter (Signed)
I spoke with pt and appt scheduled. Nothing further needed 

## 2013-03-30 NOTE — Telephone Encounter (Signed)
PFT- moderate restriction, meaning amount of air her lungs can hold is reduced.   6 minute walk test showed good oxygen levels with exercise.  I had indicated intent to see her back a bout 2 months after the first visit.

## 2013-04-01 ENCOUNTER — Encounter: Payer: Self-pay | Admitting: Internal Medicine

## 2013-04-01 ENCOUNTER — Ambulatory Visit (INDEPENDENT_AMBULATORY_CARE_PROVIDER_SITE_OTHER): Payer: Managed Care, Other (non HMO) | Admitting: Internal Medicine

## 2013-04-01 VITALS — BP 126/80 | HR 87 | Ht 67.0 in | Wt 240.0 lb

## 2013-04-01 DIAGNOSIS — Z23 Encounter for immunization: Secondary | ICD-10-CM

## 2013-04-01 DIAGNOSIS — J9610 Chronic respiratory failure, unspecified whether with hypoxia or hypercapnia: Secondary | ICD-10-CM

## 2013-04-01 DIAGNOSIS — I2699 Other pulmonary embolism without acute cor pulmonale: Secondary | ICD-10-CM

## 2013-04-01 DIAGNOSIS — Z930 Tracheostomy status: Secondary | ICD-10-CM

## 2013-04-01 DIAGNOSIS — Z9889 Other specified postprocedural states: Secondary | ICD-10-CM

## 2013-04-01 NOTE — Patient Instructions (Addendum)
Order-    Pneumovax (23)  Walk a lot to maintain stamina  Flu shot this Fall  Change your October appointment to see me in a bout 6 months

## 2013-04-01 NOTE — Progress Notes (Signed)
02/18/13- 75 yoF never smoker referred courtesy of Dr Tiburcio Pea. 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  does wear O2 at home; reports still having DOE but overall doing much better since discharge from [facility]. Hosp 08/05/12- 08/11/12- ARDS due to H1N1 flu, complicated by AKI requiring CVVHD, PE on Xarelto. Denies prior hx allergy, pneumonia, asthma or heart disease.  Now on 1 oxygen 1 L for sleep and as needed/[employer]. Dry cough. Little need for rescue inhaler. Humid weather bothers her. 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 CXR 12/01/12 IMPRESSION:  Diffuse pulmonary parenchymal coarsening with right perihilar  architectural distortion. Findings are indicative of chronic lung  disease, possibly interstitial in nature. High-resolution chest CT  would likely be helpful in further evaluation, as clinically  indicated.  Original Report Authenticated By: Leanna Battles, M.D.  04/01/13- 34 yoF never smoker followed for chronic respiratory failure after H1N1/ trach/ PE/ AKI/ myopathy in 07/2012. Leg Venous Dopplers Neg for clot 03/06/13. Dr Twanna Hy manages Xarelto. Spiriva increased her cough and did not help. Mild dry cough. Stable dyspnea on exertion without chest pain or edema PFT 03/12/2013-moderate restriction of total lung capacity, normal spirometry flows, insignificant response to dilator, diffusion moderately reduced. FVC 2.53/66%, FEV1 2.37/80%, FEV1/FVC 0.94/118%. TLC 50%, DLCO percent.  ROS-see HPI Constitutional:   No-   weight loss, night sweats, fevers, chills, fatigue, lassitude. HEENT:   + headaches, difficulty swallowing, tooth/dental problems, sore throat,       +sneezing, no-itching, ear ache, +nasal congestion, post nasal drip,  CV:  No-   chest pain, orthopnea, PND, swelling in lower  extremities, anasarca, dizziness, +palpitations Resp: + shortness of breath with exertion or at rest.              No-   productive cough,  + non-productive cough,  No- coughing up of blood.              No-   change in color of mucus.  No- wheezing.   Skin: No-   rash or lesions. GI:  No-   heartburn, indigestion, abdominal pain, nausea, vomiting,  GU:  MS:  + joint pain or swelling.   Neuro-     nothing unusual Psych:  No- change in mood or affect. + anxiety.  No memory loss.  OBJ- Physical Exam General- Alert, Oriented, Affect-appropriate, Distress- none acute Skin- rash-none, lesions- none, excoriation- none Lymphadenopathy- none Head- atraumatic            Eyes- Gross vision intact, PERRLA, conjunctivae and secretions clear            Ears- Hearing, canals-normal            Nose- Clear, no-Septal dev, mucus, polyps, erosion, perforation             Throat- Mallampati II , mucosa clear , drainage- none, tonsils- atrophic. +dental repair Neck- +tracheostomy scar, no stridor , thyroid nl, carotid no bruit Chest - symmetrical excursion , unlabored           Heart/CV- RRR , no murmur , no gallop  , no rub, nl s1 s2                           - JVD- none , edema- none, stasis changes- none, varices-  none           Lung- clear to P&A, wheeze- none, cough+mild dry , dullness-none, rub- none           Chest wall-  Abd-  Br/ Gen/ Rectal- Not done, not indicated Extrem- cyanosis- none, clubbing, none, atrophy- none, strength- nl Neuro- grossly intact to observation

## 2013-04-12 NOTE — Assessment & Plan Note (Signed)
No stridor at trachea site

## 2013-04-12 NOTE — Assessment & Plan Note (Signed)
Overall pattern is stable. There appears to be residual scarring after her c

## 2013-04-12 NOTE — Assessment & Plan Note (Signed)
Continues Xarelto with no recurrence

## 2013-04-17 ENCOUNTER — Other Ambulatory Visit (HOSPITAL_BASED_OUTPATIENT_CLINIC_OR_DEPARTMENT_OTHER): Payer: Managed Care, Other (non HMO) | Admitting: Lab

## 2013-04-17 ENCOUNTER — Ambulatory Visit (HOSPITAL_BASED_OUTPATIENT_CLINIC_OR_DEPARTMENT_OTHER): Payer: Managed Care, Other (non HMO) | Admitting: Hematology & Oncology

## 2013-04-17 VITALS — BP 122/81 | HR 72 | Temp 98.0°F | Resp 20 | Ht 67.0 in | Wt 238.0 lb

## 2013-04-17 DIAGNOSIS — I82409 Acute embolism and thrombosis of unspecified deep veins of unspecified lower extremity: Secondary | ICD-10-CM

## 2013-04-17 DIAGNOSIS — I82403 Acute embolism and thrombosis of unspecified deep veins of lower extremity, bilateral: Secondary | ICD-10-CM

## 2013-04-17 DIAGNOSIS — I2699 Other pulmonary embolism without acute cor pulmonale: Secondary | ICD-10-CM

## 2013-04-17 NOTE — Progress Notes (Signed)
This office note has been dictated.

## 2013-04-18 NOTE — Progress Notes (Signed)
CC:   [name], M.D.  DIAGNOSES: 1. Bilateral deep venous thrombosis. 2. Questionable pulmonary emboli.  CURRENT THERAPY:  Xarelto 20 mg p.o. daily.  INTERIM HISTORY:  [name] comes in for followup.  She is doing well.  We went ahead and repeated her lower extremity Dopplers.  They were done on August 4.  The Dopplers showed no residual thrombus.  She is doing well with the Xarelto.  There is no bleeding.  She has had no cough or shortness breath.  There has been no chest wall pain. The patient's has had no nausea or vomiting.  There has been no change in bowel or bladder habits.  There has been no headache.  She has had no rashes.  Overall, her performance status is ECOG 0.  PHYSICAL EXAM:  General:  This is a well-developed, well-nourished white female in no obvious distress.  Vital signs:  Temperature of 98, pulse 72, respiratory rate 20, blood pressure 122/81.  Weight is 238 pounds. Head and Neck:  Normocephalic, atraumatic skull.  There are no ocular or oral lesions.  There are no palpable cervical or supraclavicular lymph nodes.  Lungs:  Clear bilaterally.  Cardiac:  Regular rate and rhythm with a normal S1, S2.  There are no murmurs, rubs or bruits.  Abdomen: Soft.  She has good bowel sounds.  There is no fluid wave.  There is no palpable hepatosplenomegaly.  Extremities:  Show no clubbing, cyanosis or edema.  She has no venous cord in her legs.  She has a negative Homans' sign bilaterally.  She has good strength in her legs.  Skin:  No rashes, ecchymosis or petechia.  Neurologic:  No focal neurological deficits.  LABORATORY DATA:  Not done this visit.  IMPRESSION:  [name] is a very charming, 57 year old white female with bilateral deep venous thrombosis.  This occurred after she was hospitalized with H1N1 infection and acute respiratory distress syndrome complications.  She is doing well.  She has no evidence of residual thrombi.  We will go ahead and finish  up her year of anticoagulation in February 2015.  I think we will probably get her back in 3 months' time now.  I do not see any need for lab work in between visits.    ______________________________ Josph Macho, M.D. PRE/MEDQ  D:  04/17/2013  T:  04/18/2013  Job:  [id number]

## 2013-05-05 ENCOUNTER — Ambulatory Visit (INDEPENDENT_AMBULATORY_CARE_PROVIDER_SITE_OTHER): Payer: Managed Care, Other (non HMO)

## 2013-05-05 DIAGNOSIS — Z23 Encounter for immunization: Secondary | ICD-10-CM

## 2013-05-15 ENCOUNTER — Ambulatory Visit: Payer: Managed Care, Other (non HMO) | Admitting: Internal Medicine

## 2013-07-03 IMAGING — CR DG CHEST 1V PORT
1 series · 1 of 1 positions shown · non-contrast
Comparison: [DATE] and 08/11/2012

CLINICAL DATA: ARDS.

PORTABLE CHEST - 1 VIEW

[AP]
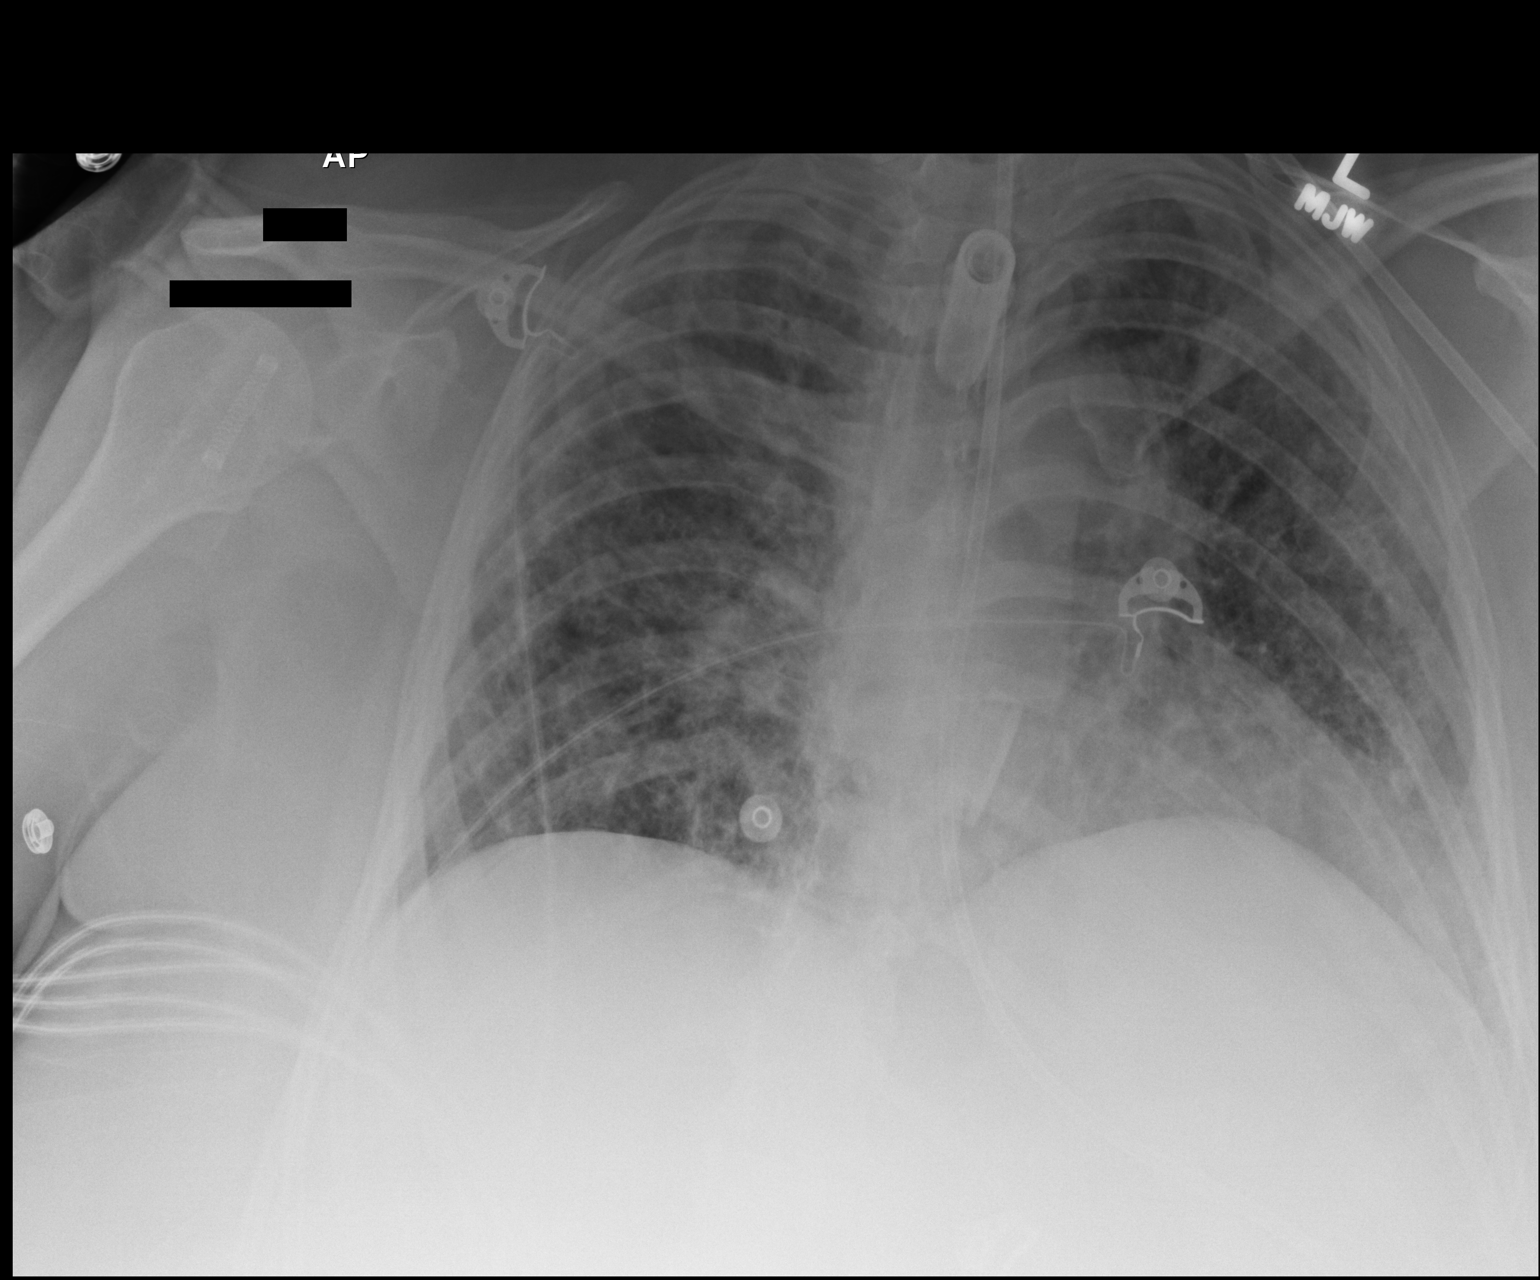

[1 of 1 positions shown; findings below may reference images not displayed]

FINDINGS: Tracheostomy tube and feeding tube are in place.  Heart
size and pulmonary vascularity are normal.  There is persistent
coarse accentuation of the interstitial markings bilaterally
without significant change.  No effusions.  No acute osseous
abnormality.
IMPRESSION: No change in the coarse interstitial infiltrates.

## 2013-07-05 IMAGING — CR DG ABD PORTABLE 1V
1 series · 1 of 1 positions shown · non-contrast
Comparison: Portable exam 6053 hours compared to 08/23/2012

CLINICAL DATA: Feeding tube placement

PORTABLE ABDOMEN - 1 VIEW

[AP]
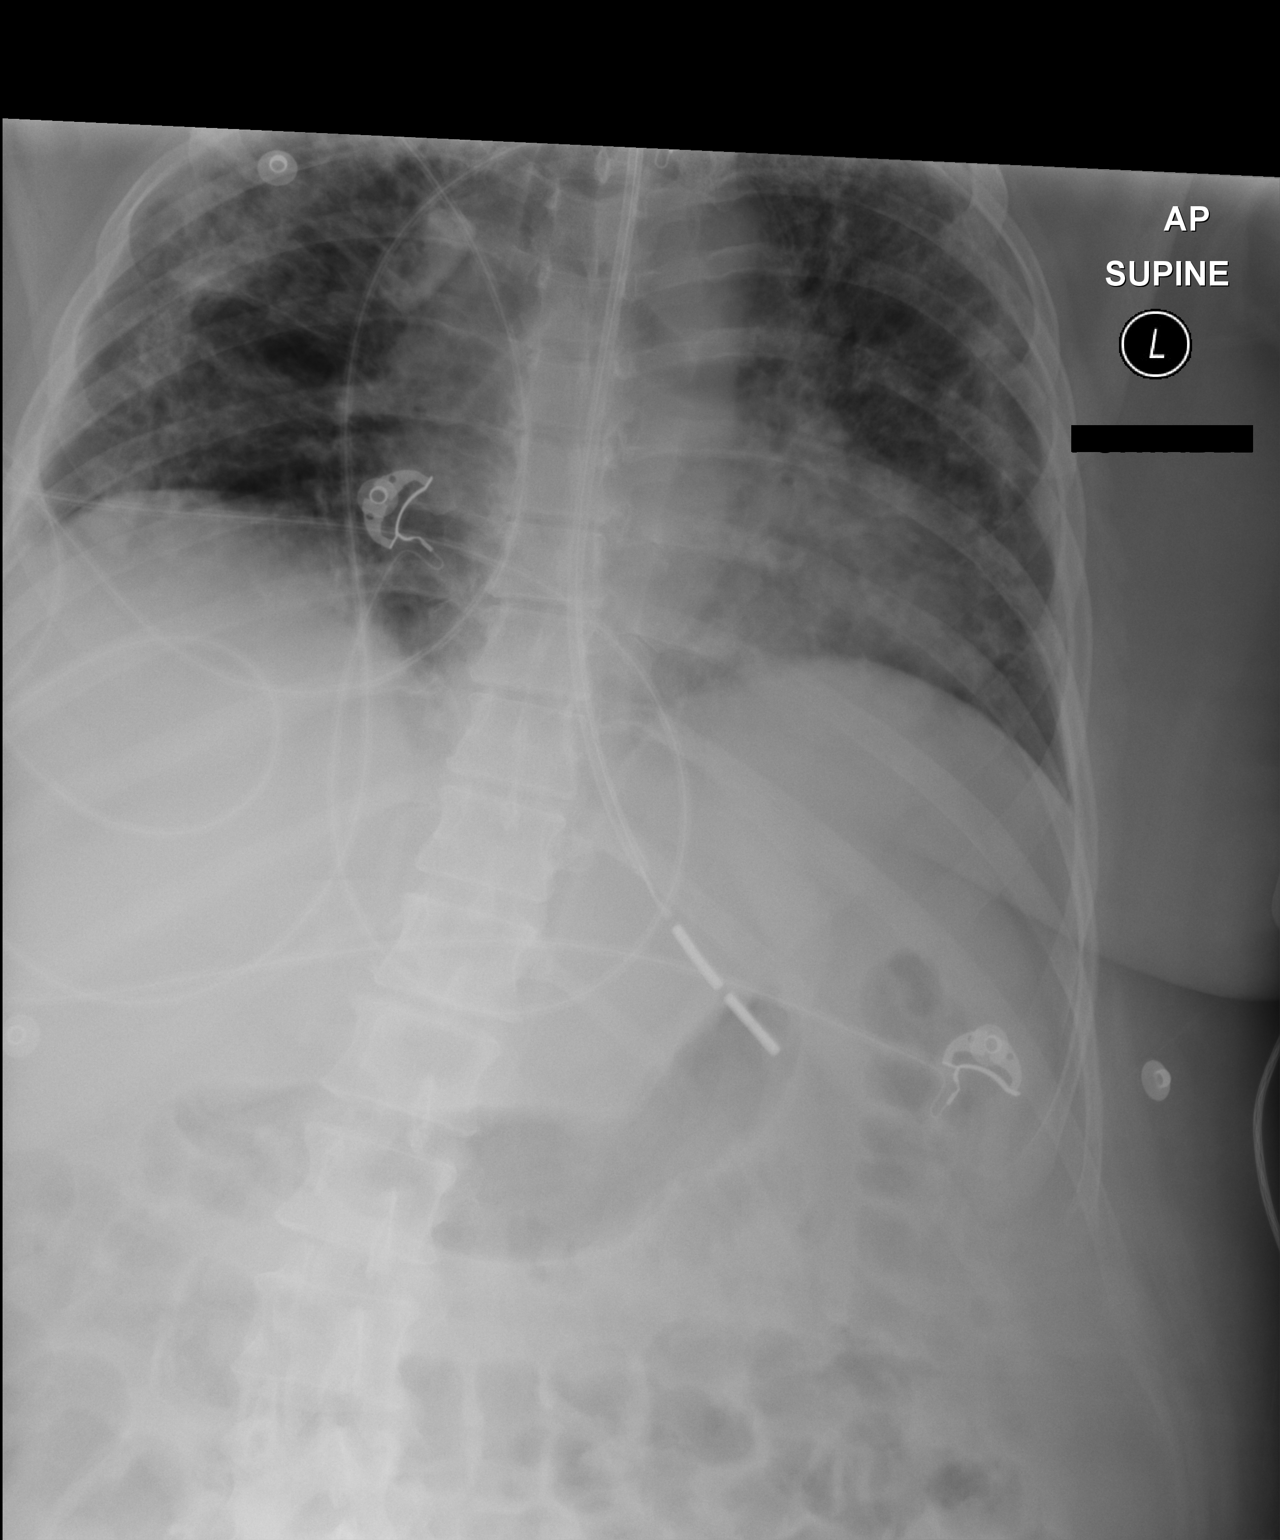

[1 of 1 positions shown; findings below may reference images not displayed]

FINDINGS: Tip of feeding tube projects over the proximal to mid stomach.
Visualized bowel gas pattern normal.
Persistent bibasilar infiltrates.
Enlargement of cardiac silhouette.
IMPRESSION: Tip of feeding tube projects over the proximal to mid stomach.

## 2013-07-06 IMAGING — CR DG CHEST 1V PORT
1 series · 1 of 1 positions shown · non-contrast
Comparison: 08/29/2012

CLINICAL DATA: Acute respiratory distress syndrome.  On ventilator.

PORTABLE CHEST - 1 VIEW

[AP]
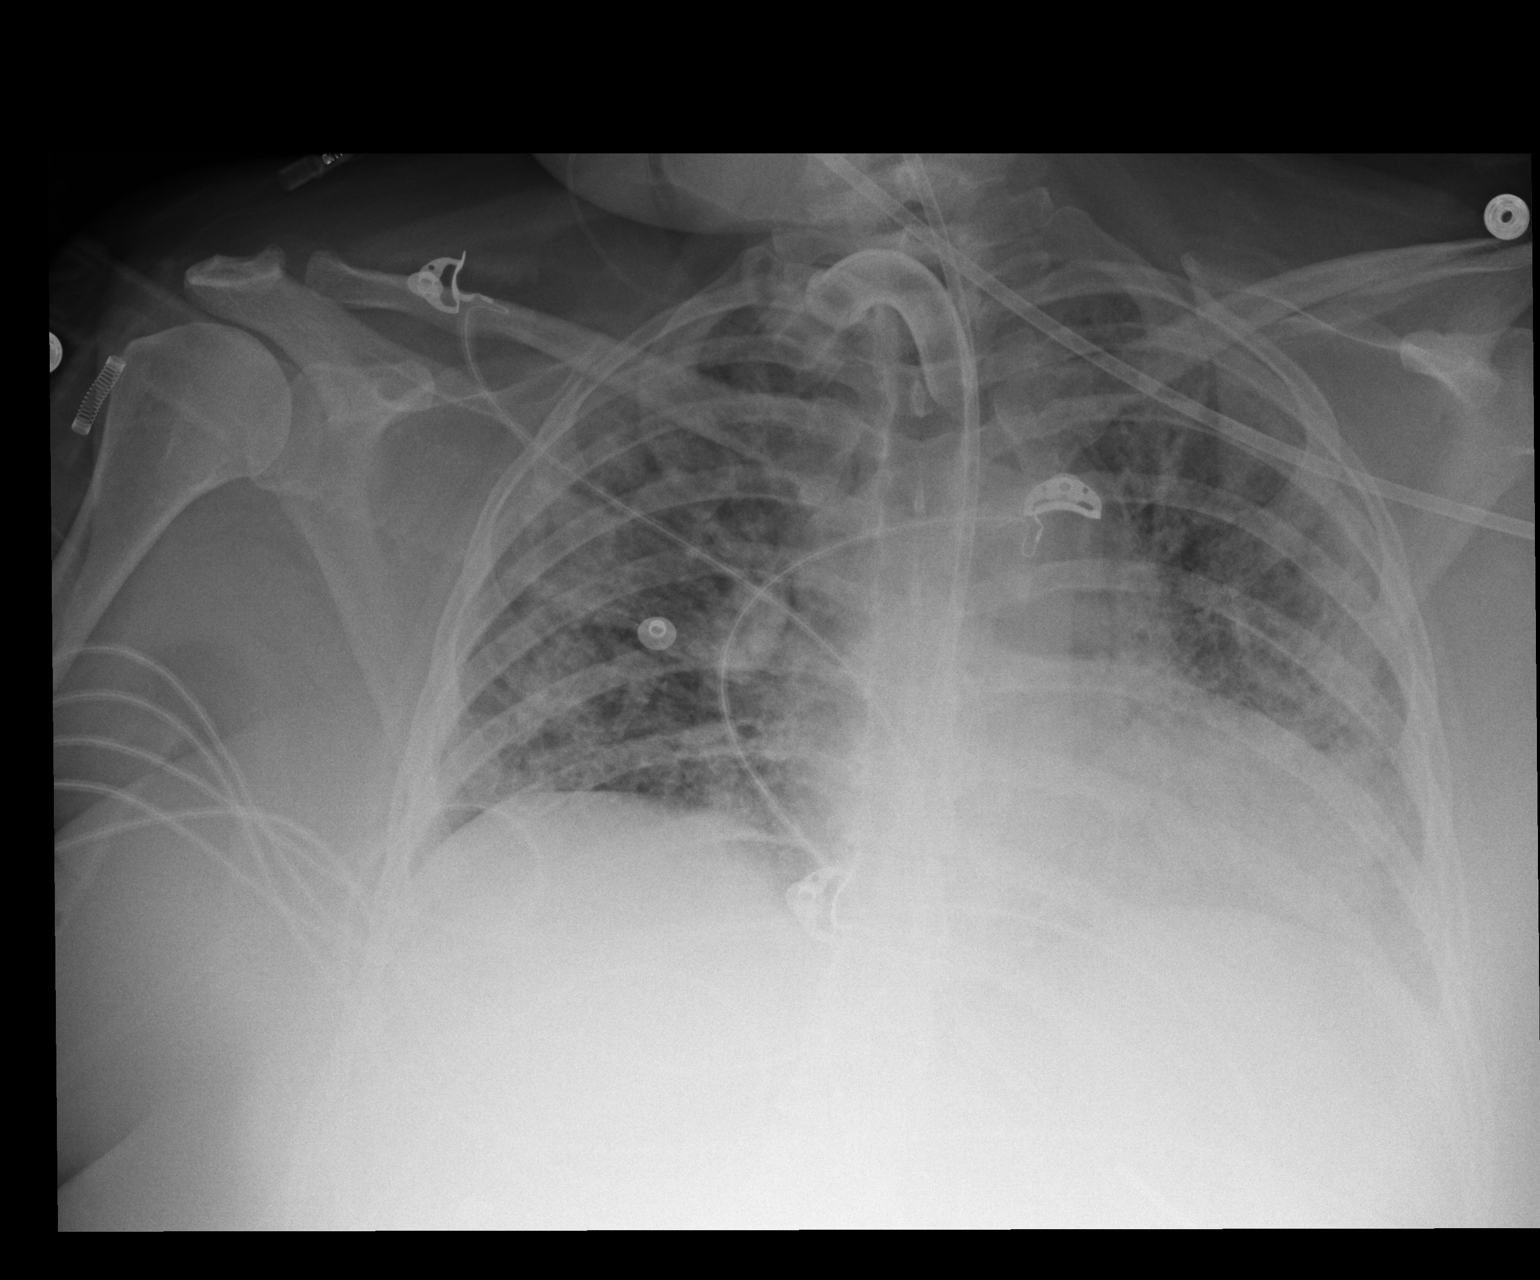

[1 of 1 positions shown; findings below may reference images not displayed]

FINDINGS: Support apparatus remains in stable position.  Diffuse
bilateral airspace disease shows no significant interval change.
Low lung volumes are again noted.  Heart size is stable and within
normal limits.
IMPRESSION: Stable diffuse bilateral airspace disease, consistent with ARDS.

## 2013-07-08 IMAGING — CR DG CHEST 1V PORT
1 series · 1 of 1 positions shown · non-contrast
Comparison: 08/30/2012

CLINICAL DATA: ARDS

PORTABLE CHEST - 1 VIEW

[AP]
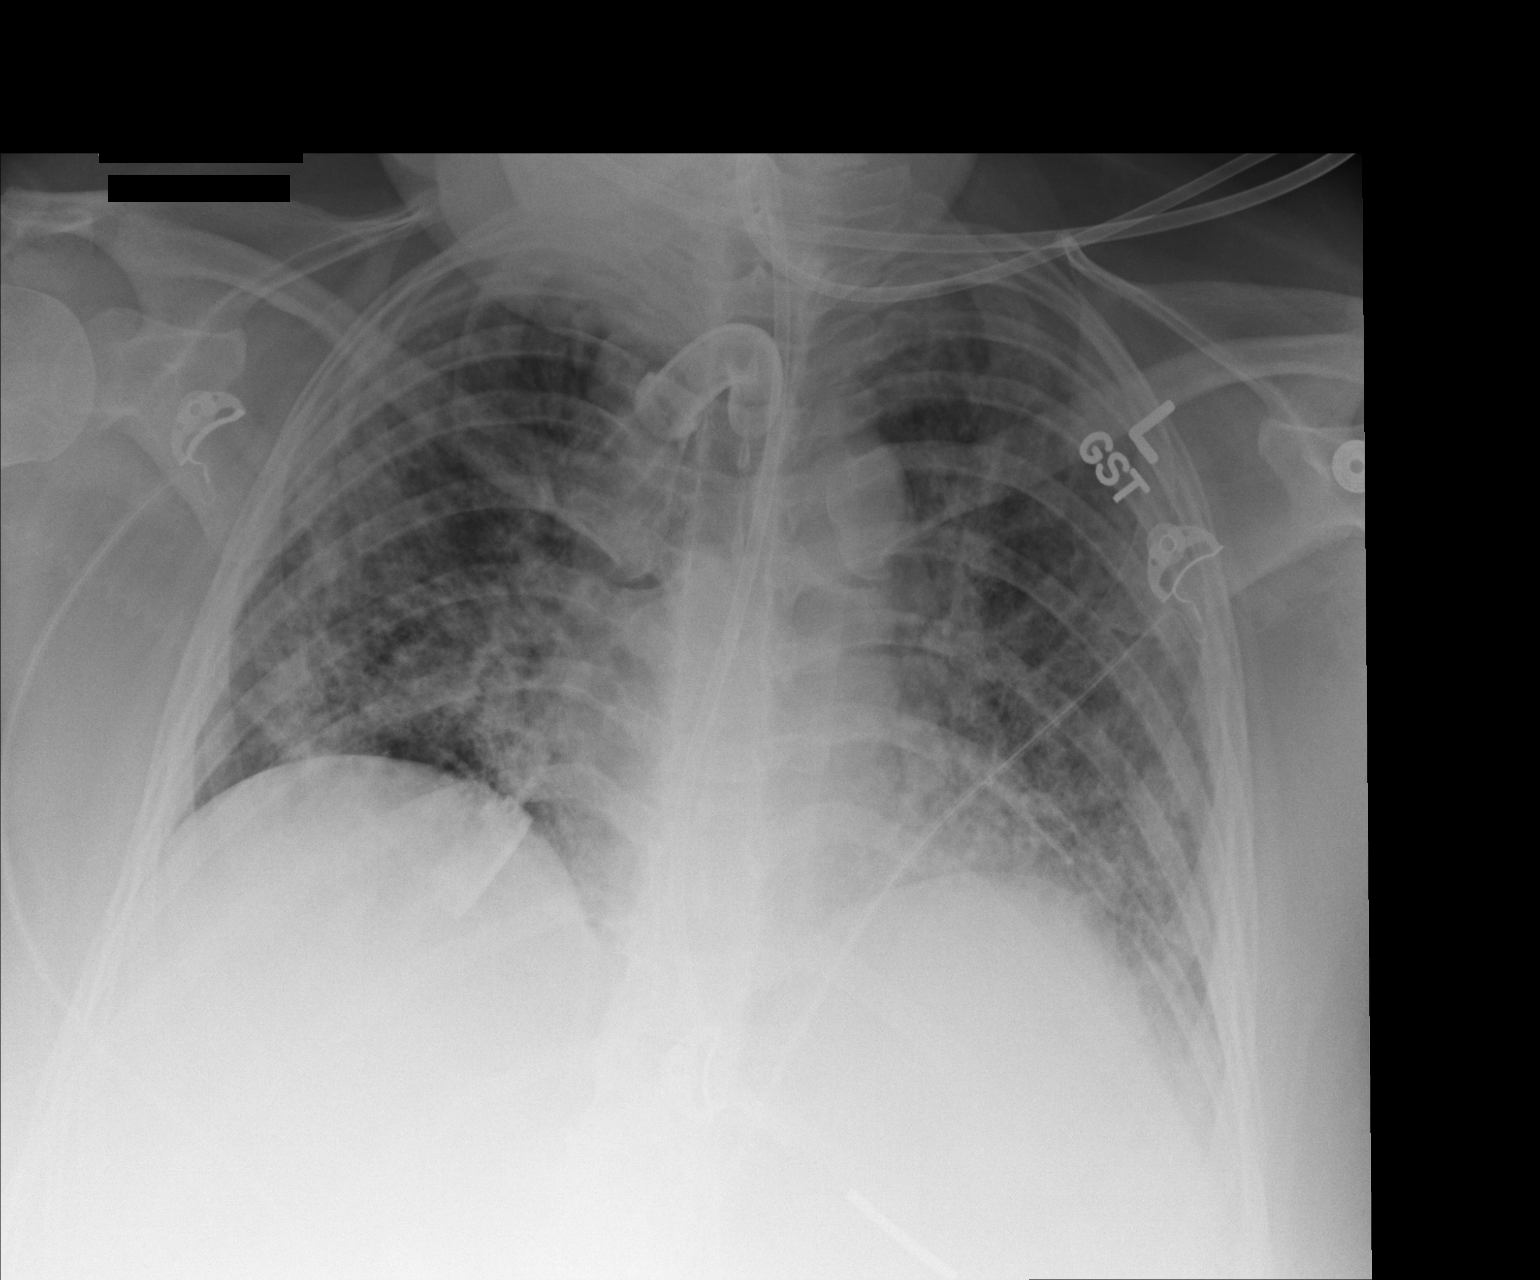

[1 of 1 positions shown; findings below may reference images not displayed]

FINDINGS: Tracheostomy tube and feeding tube are stable.  Feeding
tube tip is in the fundus of the stomach.  Diffuse bilateral
airspace disease is slightly improved.  Upper normal heart size.
No pneumothorax.
IMPRESSION: Slightly improved airspace disease.

## 2013-07-10 IMAGING — CR DG CHEST 1V PORT
1 series · 1 of 1 positions shown · non-contrast
Comparison: 09/01/2012

CLINICAL DATA: Acute respiratory failure on ventilator.

PORTABLE CHEST - 1 VIEW

[AP]
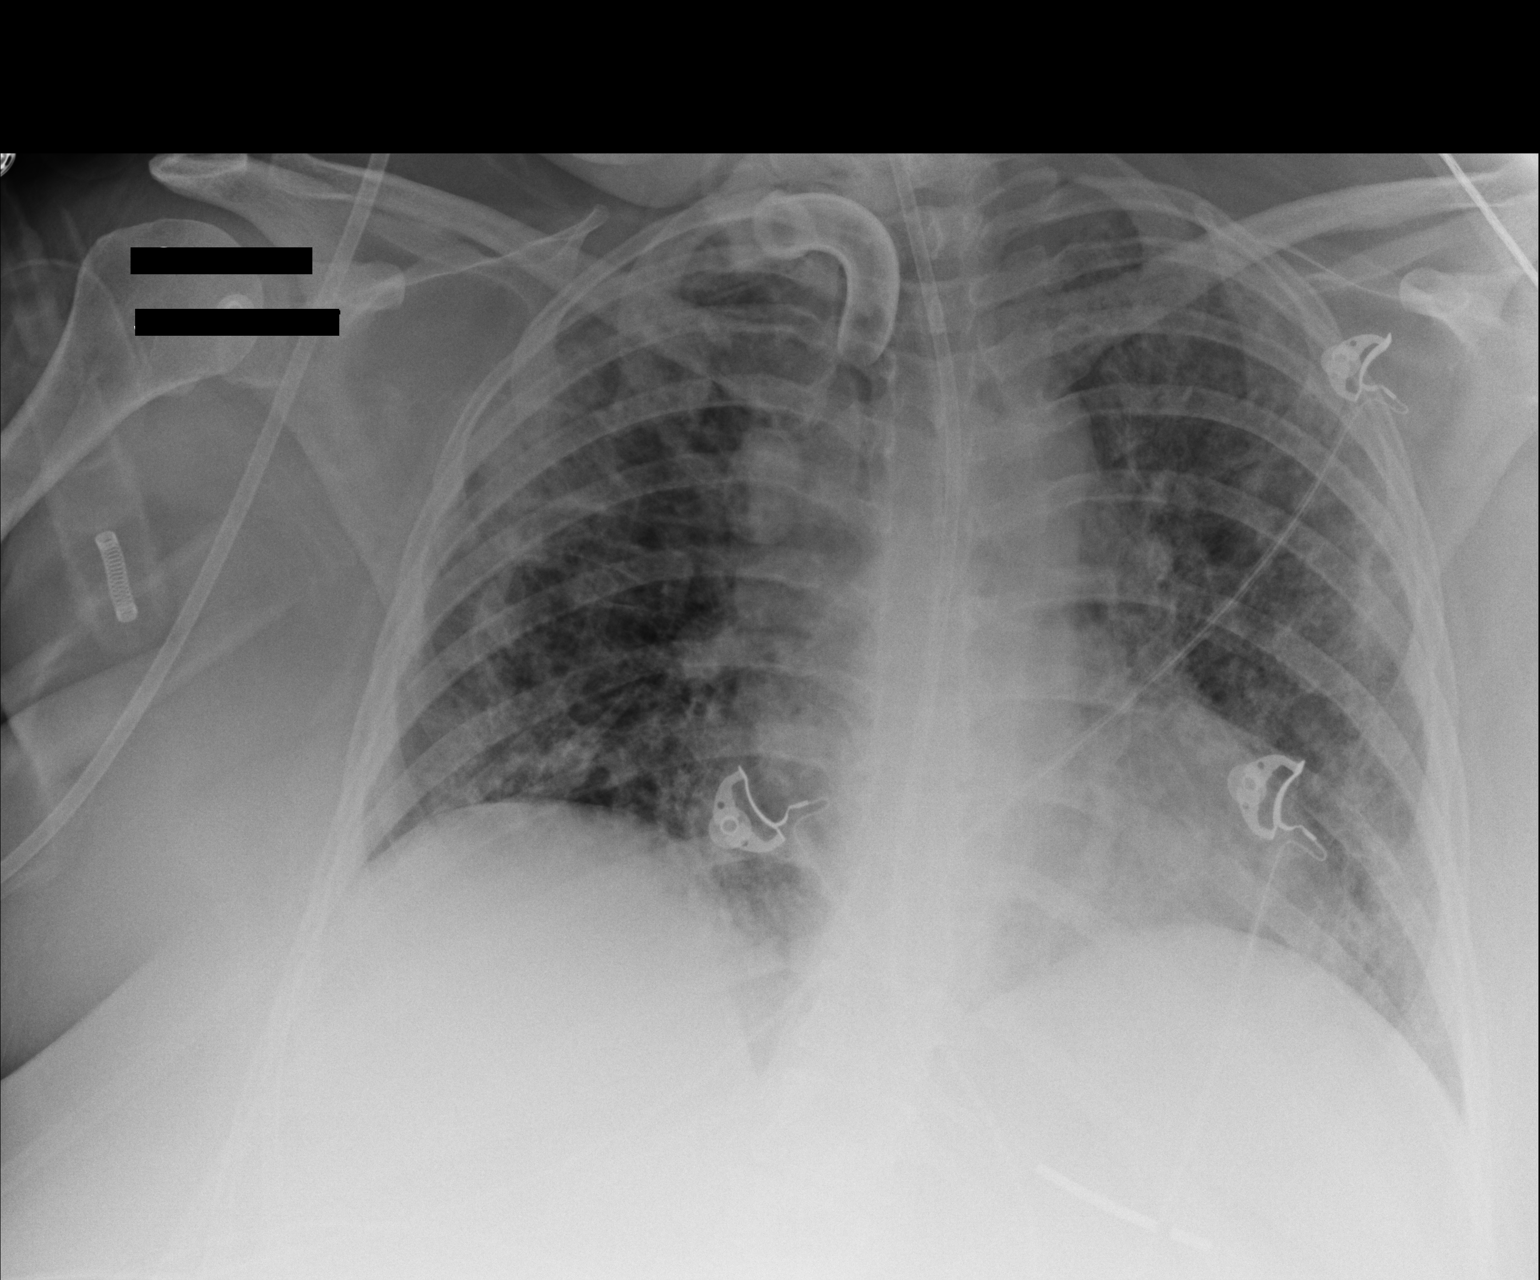

[1 of 1 positions shown; findings below may reference images not displayed]

FINDINGS: Tracheostomy tube and feeding tube remain in place.
Mildly improved aeration is seen in the left lung base.  Diffuse
interstitial infiltrates are otherwise stable.  The heart size is
within normal limits in stable.  No pneumothorax identified.
IMPRESSION: Mildly improved aeration of left lung base.  Otherwise stable
diffuse interstitial infiltrates.

## 2013-07-15 ENCOUNTER — Other Ambulatory Visit: Payer: Self-pay | Admitting: Family Medicine

## 2013-07-15 ENCOUNTER — Ambulatory Visit
Admission: RE | Admit: 2013-07-15 | Discharge: 2013-07-15 | Disposition: A | Discharge status: Home / Self Care | Payer: Managed Care, Other (non HMO) | Source: Ambulatory Visit | Attending: Family Medicine | Admitting: Family Medicine

## 2013-07-15 DIAGNOSIS — J40 Bronchitis, not specified as acute or chronic: Secondary | ICD-10-CM

## 2013-07-16 ENCOUNTER — Telehealth: Payer: Self-pay | Admitting: Hematology & Oncology

## 2013-07-16 NOTE — Telephone Encounter (Signed)
Pt cx 12-12 she is sick and will call back to reschedule

## 2013-07-17 ENCOUNTER — Ambulatory Visit: Payer: Managed Care, Other (non HMO) | Admitting: Hematology & Oncology

## 2013-07-17 ENCOUNTER — Other Ambulatory Visit: Payer: Managed Care, Other (non HMO) | Admitting: Lab

## 2013-09-10 ENCOUNTER — Other Ambulatory Visit: Payer: Self-pay | Admitting: *Deleted

## 2013-09-10 DIAGNOSIS — I10 Essential (primary) hypertension: Secondary | ICD-10-CM

## 2013-09-11 ENCOUNTER — Encounter: Payer: Self-pay | Admitting: Hematology & Oncology

## 2013-09-11 ENCOUNTER — Ambulatory Visit (HOSPITAL_BASED_OUTPATIENT_CLINIC_OR_DEPARTMENT_OTHER): Payer: Managed Care, Other (non HMO) | Admitting: Hematology & Oncology

## 2013-09-11 ENCOUNTER — Other Ambulatory Visit (HOSPITAL_BASED_OUTPATIENT_CLINIC_OR_DEPARTMENT_OTHER): Payer: Managed Care, Other (non HMO) | Admitting: Lab

## 2013-09-11 VITALS — BP 147/89 | HR 64 | Temp 98.0°F | Resp 14 | Ht 67.0 in | Wt 238.0 lb

## 2013-09-11 DIAGNOSIS — Z86718 Personal history of other venous thrombosis and embolism: Secondary | ICD-10-CM

## 2013-09-11 DIAGNOSIS — Z7901 Long term (current) use of anticoagulants: Secondary | ICD-10-CM

## 2013-09-11 DIAGNOSIS — I2699 Other pulmonary embolism without acute cor pulmonale: Secondary | ICD-10-CM

## 2013-09-11 DIAGNOSIS — I82403 Acute embolism and thrombosis of unspecified deep veins of lower extremity, bilateral: Secondary | ICD-10-CM

## 2013-09-11 LAB — CBC WITH DIFFERENTIAL (CANCER CENTER ONLY)
BASO#: 0 10*3/uL (ref 0.0–0.2)
BASO%: 0.4 % (ref 0.0–2.0)
EOS ABS: 0.2 10*3/uL (ref 0.0–0.5)
EOS%: 2.5 % (ref 0.0–7.0)
HCT: 41.9 % (ref 34.8–46.6)
HGB: 13.9 g/dL (ref 11.6–15.9)
LYMPH#: 1.6 10*3/uL (ref 0.9–3.3)
LYMPH%: 22.8 % (ref 14.0–48.0)
MCH: 27.4 pg (ref 26.0–34.0)
MCHC: 33.2 g/dL (ref 32.0–36.0)
MCV: 83 fL (ref 81–101)
MONO#: 0.7 10*3/uL (ref 0.1–0.9)
MONO%: 9.2 % (ref 0.0–13.0)
NEUT#: 4.7 10*3/uL (ref 1.5–6.5)
NEUT%: 65.1 % (ref 39.6–80.0)
PLATELETS: 255 10*3/uL (ref 145–400)
RBC: 5.07 10*6/uL (ref 3.70–5.32)
RDW: 13.7 % (ref 11.1–15.7)
WBC: 7.2 10*3/uL (ref 3.9–10.0)

## 2013-09-11 NOTE — Progress Notes (Signed)
This office note has been dictated.

## 2013-09-12 LAB — D-DIMER, QUANTITATIVE: D-Dimer, Quant: 0.27 ug/mL-FEU (ref 0.00–0.48)

## 2013-09-12 NOTE — Progress Notes (Signed)
DIAGNOSIS:  History of bilateral deep venous thrombosis.  CURRENT THERAPY:  The patient to complete Xarelto in 2 weeks.  INTERIM HISTORY:  Ms. [name] comes in for a followup.  We last saw her back in September.  She has been doing quite well.  When we repeated her Doppler back in August, there was no evidence of any residual thrombus in her legs.  She is going to have a dental procedure done in February.  I thought that we probably could get her off the Xarelto.  She would have been on Xarelto for 1 year.  She says she cannot take aspirin.  As such, I just would follow her along without any anticoagulation.  She has had no cough or shortness of breath.  She has had no fever.  She has had no bleeding.  She has had no nausea or vomiting.  There has been no rashes.  She has had no headache or blurred vision.  PHYSICAL EXAMINATION:  General:  This is a well-developed, well- nourished white female in no obvious distress.  Vital Signs: Temperature of 98, pulse 64, respiratory rate 18, blood pressure 147/89, weight is 256 pounds.  Head and Neck:  Normocephalic, atraumatic skull. There are no ocular or oral lesions.  There are no palpable cervical or supraclavicular lymph nodes.  Lungs:  Clear bilaterally.  Cardiac: Regular rate and rhythm with a normal S1 and S2.  There are no murmurs, rubs, or bruits.  Abdomen:  Soft.  She has good bowel sounds.  There is no fluid wave.  There is no palpable hepatosplenomegaly.  Back:  No tenderness over the spine, ribs, or hips.  Extremities:  Show no clubbing, cyanosis, or edema.  She has no venous cord in her legs.  She has good range motion of her joints.  Skin:  No rashes, ecchymosis, or petechia.  Neurological:  No focal neurological deficit.  LABORATORY STUDIES:  White cell count is 7.2, hemoglobin 13.9, hematocrit 41.9, platelet count 255.  IMPRESSION:  Ms. [name] is a very charming 58 year old white female.  She developed H1N1 infection.  She was  incredibly ill.  She then developed ARDS.  She was then hospitalized.  She was kept live on ventilator.  She developed bilateral lower extremity DVT.  There was a question whether or not there was any pulmonary emboli.  Again, It will be a year that she has been on anticoagulation.  She has done well with Xarelto.  I do not see any evidence of residual thrombotic disease.  I think we can let Ms. [name] go from the practice now.  I told that she can always come back to see us if she has any problems.  I told that if she were to have any kind of surgery in the future, that she should let us know.  As always, I had good fellowship with Ms. [name] and her husband.  They are both very, very nice.    ______________________________ Josph MachoPeter R Nicol Herbig, M.D. PRE/MEDQ  D:  09/11/2013  T:  09/12/2013  Job:  [id number]

## 2013-10-08 ENCOUNTER — Ambulatory Visit: Payer: Managed Care, Other (non HMO) | Admitting: Internal Medicine

## 2013-11-06 ENCOUNTER — Ambulatory Visit: Payer: Managed Care, Other (non HMO) | Admitting: Internal Medicine

## 2013-11-24 ENCOUNTER — Telehealth: Payer: Self-pay | Admitting: Internal Medicine

## 2013-11-24 NOTE — Telephone Encounter (Signed)
Spoke with pt. She reports pt changed her insurance companies. She now has [employer]. She was told she would need a referral to come in and see [facility] tomorrow AM or she would have to pay for appt out of pocket. She reports her PCP was suppose to fax a referral over to us. I spoke with Dot LanesKrista and she reports she has not had to have referral if pt has [employer]. I called pt back and made her aware. Nothing further needed

## 2013-11-24 NOTE — Telephone Encounter (Signed)
Pt changed ins companies and they require a referral.[name]

## 2013-11-25 ENCOUNTER — Ambulatory Visit: Payer: Managed Care, Other (non HMO) | Admitting: Internal Medicine

## 2014-01-05 ENCOUNTER — Encounter: Payer: Self-pay | Admitting: Internal Medicine

## 2014-01-05 ENCOUNTER — Ambulatory Visit (INDEPENDENT_AMBULATORY_CARE_PROVIDER_SITE_OTHER): Payer: 59 | Admitting: Internal Medicine

## 2014-01-05 VITALS — BP 126/78 | HR 69 | Ht 67.0 in | Wt 266.4 lb

## 2014-01-05 DIAGNOSIS — J9611 Chronic respiratory failure with hypoxia: Secondary | ICD-10-CM

## 2014-01-05 DIAGNOSIS — J9610 Chronic respiratory failure, unspecified whether with hypoxia or hypercapnia: Secondary | ICD-10-CM

## 2014-01-05 DIAGNOSIS — Z930 Tracheostomy status: Secondary | ICD-10-CM

## 2014-01-05 DIAGNOSIS — R0902 Hypoxemia: Secondary | ICD-10-CM

## 2014-01-05 DIAGNOSIS — I2699 Other pulmonary embolism without acute cor pulmonale: Secondary | ICD-10-CM

## 2014-01-05 DIAGNOSIS — Z9889 Other specified postprocedural states: Secondary | ICD-10-CM

## 2014-01-05 NOTE — Patient Instructions (Signed)
I encourage you to walk or do other gradual exercises like water aerobics, Silver Sneakers at the "Y", etc. We want to maintain stamina and endurance.  Please call as needed

## 2014-01-05 NOTE — Progress Notes (Signed)
02/18/13- [age] yoF never smoker referred courtesy of Dr Tiburcio PeaHarris. 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  does wear O2 at home; reports still having DOE but overall doing much better since discharge from [facility]. Hosp 08/05/12- 08/11/12- ARDS due to H1N1 flu, complicated by AKI requiring CVVHD, PE on Xarelto. Denies prior hx allergy, pneumonia, asthma or heart disease.  Now on 1 oxygen 1 L for sleep and as needed/[employer]. Dry cough. Little need for rescue inhaler. Humid weather bothers her. 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 CXR 12/01/12 IMPRESSION:  Diffuse pulmonary parenchymal coarsening with right perihilar  architectural distortion. Findings are indicative of chronic lung  disease, possibly interstitial in nature. High-resolution chest CT  would likely be helpful in further evaluation, as clinically  indicated.  Original Report Authenticated By: Leanna BattlesMelinda Blietz, M.D.  04/01/13- [age] yoF never smoker followed for chronic respiratory failure after H1N1/ trach/ PE/ AKI/ myopathy in 07/2012. Leg Venous Dopplers Neg for clot 03/06/13. Dr Twanna HyEnever manages Xarelto. Spiriva increased her cough and did not help. Mild dry cough. Stable dyspnea on exertion without chest pain or edema PFT 03/12/2013-moderate restriction of total lung capacity, normal spirometry flows, insignificant response to dilator, diffusion moderately reduced. FVC 2.53/66%, FEV1 2.37/80%, FEV1/FVC 0.94/118%. TLC 50%, DLCO percent.  01/05/14- 57 yoF never smoker followed for chronic respiratory failure after H1N1/ trach/ PE/ AKI/ myopathy in 07/2012. FOLLOWS FOR: Harder time breathing with warmer weather; O2 was sent back in June/July of 2014. Summer heat is bothering. Some dry cough and DOE. Uses rescue inhaler only if sick. CXR 07/15/13 IMPRESSION:   Diffuse increased lung markings appear chronic. Please see above  discussion.  Cardiomegaly.  Scoliosis.  Calcified tortuous aorta  Electronically Signed  By: Bridgett LarssonSteve Olson M.D.  On: 07/15/2013 16:50  ROS-see HPI Constitutional:   No-   weight loss, night sweats, fevers, chills, fatigue, lassitude. HEENT:   + headaches, difficulty swallowing, tooth/dental problems, sore throat,       +sneezing, no-itching, ear ache, nasal congestion, post nasal drip,  CV:  No-   chest pain, orthopnea, PND, swelling in lower extremities, anasarca, dizziness, +palpitations Resp: + shortness of breath with exertion or at rest.              No-   productive cough,  + non-productive cough,  No- coughing up of blood.              No-   change in color of mucus.  No- wheezing.   Skin: No-   rash or lesions. GI:  No-   heartburn, indigestion, abdominal pain, nausea, vomiting,  GU:  MS:  + joint pain or swelling.   Neuro-     nothing unusual Psych:  No- change in mood or affect. + anxiety.  No memory loss.  OBJ- Physical Exam General- Alert, Oriented, Affect-appropriate, Distress- none acute, overweight Skin- rash-none, lesions- none, excoriation- none Lymphadenopathy- none Head- atraumatic            Eyes- Gross vision intact, PERRLA, conjunctivae and secretions clear            Ears- Hearing, canals-normal            Nose- Clear, no-Septal dev, mucus, polyps, erosion, perforation             Throat- Mallampati II , mucosa  clear , drainage- none, tonsils- atrophic. +dental repair Neck- +tracheostomy scar, no stridor , thyroid nl, carotid no bruit Chest - symmetrical excursion , unlabored           Heart/CV- RRR , no murmur , no gallop  , no rub, nl s1 s2                           - JVD- none , edema- none, stasis changes- none, varices- none           Lung- clear to P&A, wheeze- none, cough+mild dry , dullness-none, rub- none           Chest wall-  Abd-  Br/ Gen/ Rectal- Not done, not  indicated Extrem- cyanosis- none, clubbing, none, atrophy- none, strength- nl Neuro- grossly intact to observation

## 2014-03-10 NOTE — Assessment & Plan Note (Signed)
Off Xarelto 

## 2014-03-10 NOTE — Assessment & Plan Note (Signed)
Post inflammatory fibrosis, chronic bronchitis. Weight loss would help Plan- walk to lose weight and build stamina

## 2014-03-10 NOTE — Assessment & Plan Note (Signed)
Closed. Good airflow- no stridor

## 2015-01-07 ENCOUNTER — Ambulatory Visit: Payer: 59 | Admitting: Internal Medicine

## 2020-04-03 ENCOUNTER — Inpatient Hospital Stay (HOSPITAL_BASED_OUTPATIENT_CLINIC_OR_DEPARTMENT_OTHER)
Admission: EM | Admit: 2020-04-03 | Discharge: 2020-04-06 | DRG: 291 | Disposition: A | Discharge status: Home / Self Care | Payer: Self-pay | Source: Other Acute Inpatient Hospital | Attending: Internal Medicine | Admitting: Internal Medicine

## 2020-04-03 ENCOUNTER — Emergency Department (HOSPITAL_BASED_OUTPATIENT_CLINIC_OR_DEPARTMENT_OTHER): Payer: Self-pay

## 2020-04-03 ENCOUNTER — Other Ambulatory Visit: Payer: Self-pay

## 2020-04-03 DIAGNOSIS — I110 Hypertensive heart disease with heart failure: Principal | ICD-10-CM | POA: Diagnosis present

## 2020-04-03 DIAGNOSIS — F338 Other recurrent depressive disorders: Secondary | ICD-10-CM | POA: Diagnosis present

## 2020-04-03 DIAGNOSIS — E66813 Obesity, class 3: Secondary | ICD-10-CM | POA: Diagnosis present

## 2020-04-03 DIAGNOSIS — Z833 Family history of diabetes mellitus: Secondary | ICD-10-CM

## 2020-04-03 DIAGNOSIS — J8410 Pulmonary fibrosis, unspecified: Secondary | ICD-10-CM | POA: Diagnosis present

## 2020-04-03 DIAGNOSIS — R0902 Hypoxemia: Secondary | ICD-10-CM

## 2020-04-03 DIAGNOSIS — J440 Chronic obstructive pulmonary disease with acute lower respiratory infection: Secondary | ICD-10-CM | POA: Diagnosis present

## 2020-04-03 DIAGNOSIS — J9622 Acute and chronic respiratory failure with hypercapnia: Secondary | ICD-10-CM | POA: Diagnosis present

## 2020-04-03 DIAGNOSIS — Z6841 Body Mass Index (BMI) 40.0 and over, adult: Secondary | ICD-10-CM

## 2020-04-03 DIAGNOSIS — F4321 Adjustment disorder with depressed mood: Secondary | ICD-10-CM | POA: Diagnosis present

## 2020-04-03 DIAGNOSIS — Z86711 Personal history of pulmonary embolism: Secondary | ICD-10-CM

## 2020-04-03 DIAGNOSIS — Z8249 Family history of ischemic heart disease and other diseases of the circulatory system: Secondary | ICD-10-CM

## 2020-04-03 DIAGNOSIS — I493 Ventricular premature depolarization: Secondary | ICD-10-CM | POA: Diagnosis not present

## 2020-04-03 DIAGNOSIS — I248 Other forms of acute ischemic heart disease: Secondary | ICD-10-CM | POA: Diagnosis present

## 2020-04-03 DIAGNOSIS — Z882 Allergy status to sulfonamides status: Secondary | ICD-10-CM

## 2020-04-03 DIAGNOSIS — Z8261 Family history of arthritis: Secondary | ICD-10-CM

## 2020-04-03 DIAGNOSIS — I5033 Acute on chronic diastolic (congestive) heart failure: Secondary | ICD-10-CM | POA: Diagnosis present

## 2020-04-03 DIAGNOSIS — Z86718 Personal history of other venous thrombosis and embolism: Secondary | ICD-10-CM

## 2020-04-03 DIAGNOSIS — Z886 Allergy status to analgesic agent status: Secondary | ICD-10-CM

## 2020-04-03 DIAGNOSIS — J9602 Acute respiratory failure with hypercapnia: Secondary | ICD-10-CM | POA: Diagnosis present

## 2020-04-03 DIAGNOSIS — J9621 Acute and chronic respiratory failure with hypoxia: Secondary | ICD-10-CM | POA: Diagnosis present

## 2020-04-03 DIAGNOSIS — I10 Essential (primary) hypertension: Secondary | ICD-10-CM

## 2020-04-03 DIAGNOSIS — I160 Hypertensive urgency: Secondary | ICD-10-CM | POA: Diagnosis present

## 2020-04-03 DIAGNOSIS — J9610 Chronic respiratory failure, unspecified whether with hypoxia or hypercapnia: Secondary | ICD-10-CM | POA: Diagnosis present

## 2020-04-03 DIAGNOSIS — Z20822 Contact with and (suspected) exposure to covid-19: Secondary | ICD-10-CM | POA: Diagnosis present

## 2020-04-03 DIAGNOSIS — J9601 Acute respiratory failure with hypoxia: Secondary | ICD-10-CM | POA: Diagnosis present

## 2020-04-03 DIAGNOSIS — Z79899 Other long term (current) drug therapy: Secondary | ICD-10-CM

## 2020-04-03 HISTORY — DX: Influenza due to other identified influenza virus with other respiratory manifestations: J10.1

## 2020-04-03 HISTORY — DX: Acute embolism and thrombosis of unspecified deep veins of unspecified lower extremity: I82.409

## 2020-04-03 HISTORY — DX: Other recurrent depressive disorders: F33.8

## 2020-04-03 HISTORY — DX: Morbid (severe) obesity due to excess calories: E66.01

## 2020-04-03 LAB — I-STAT ARTERIAL BLOOD GAS, ED
Acid-Base Excess: 5 mmol/L — ABNORMAL HIGH (ref 0.0–2.0)
Bicarbonate: 32.6 mmol/L — ABNORMAL HIGH (ref 20.0–28.0)
Calcium, Ion: 1.24 mmol/L (ref 1.15–1.40)
HCT: 46 % (ref 36.0–46.0)
Hemoglobin: 15.6 g/dL — ABNORMAL HIGH (ref 12.0–15.0)
O2 Saturation: 99 %
Patient temperature: 99.5
Potassium: 4.6 mmol/L (ref 3.5–5.1)
Sodium: 140 mmol/L (ref 135–145)
TCO2: 34 mmol/L — ABNORMAL HIGH (ref 22–32)
pCO2 arterial: 57.5 mmHg — ABNORMAL HIGH (ref 32.0–48.0)
pH, Arterial: 7.364 (ref 7.350–7.450)
pO2, Arterial: 142 mmHg — ABNORMAL HIGH (ref 83.0–108.0)

## 2020-04-03 LAB — CBC WITH DIFFERENTIAL/PLATELET
Abs Immature Granulocytes: 0.07 10*3/uL (ref 0.00–0.07)
Basophils Absolute: 0 10*3/uL (ref 0.0–0.1)
Basophils Relative: 0 %
Eosinophils Absolute: 0 10*3/uL (ref 0.0–0.5)
Eosinophils Relative: 0 %
HCT: 48.1 % — ABNORMAL HIGH (ref 36.0–46.0)
Hemoglobin: 15 g/dL (ref 12.0–15.0)
Immature Granulocytes: 1 %
Lymphocytes Relative: 12 %
Lymphs Abs: 0.9 10*3/uL (ref 0.7–4.0)
MCH: 29 pg (ref 26.0–34.0)
MCHC: 31.2 g/dL (ref 30.0–36.0)
MCV: 93 fL (ref 80.0–100.0)
Monocytes Absolute: 0.5 10*3/uL (ref 0.1–1.0)
Monocytes Relative: 7 %
Neutro Abs: 6.2 10*3/uL (ref 1.7–7.7)
Neutrophils Relative %: 80 %
Platelets: 231 10*3/uL (ref 150–400)
RBC: 5.17 MIL/uL — ABNORMAL HIGH (ref 3.87–5.11)
RDW: 13.3 % (ref 11.5–15.5)
WBC: 7.7 10*3/uL (ref 4.0–10.5)
nRBC: 0 % (ref 0.0–0.2)

## 2020-04-03 LAB — COMPREHENSIVE METABOLIC PANEL
ALT: 101 U/L — ABNORMAL HIGH (ref 0–44)
AST: 94 U/L — ABNORMAL HIGH (ref 15–41)
Albumin: 3.6 g/dL (ref 3.5–5.0)
Alkaline Phosphatase: 82 U/L (ref 38–126)
Anion gap: 7 (ref 5–15)
BUN: 16 mg/dL (ref 8–23)
CO2: 30 mmol/L (ref 22–32)
Calcium: 8.5 mg/dL — ABNORMAL LOW (ref 8.9–10.3)
Chloride: 100 mmol/L (ref 98–111)
Creatinine, Ser: 0.82 mg/dL (ref 0.44–1.00)
GFR calc Af Amer: >60 mL/min (ref >60)
GFR calc non Af Amer: >60 mL/min (ref >60)
Glucose, Bld: 145 mg/dL — ABNORMAL HIGH (ref 70–99)
Potassium: 4.7 mmol/L (ref 3.5–5.1)
Sodium: 137 mmol/L (ref 135–145)
Total Bilirubin: 0.4 mg/dL (ref 0.3–1.2)
Total Protein: 6.6 g/dL (ref 6.5–8.1)

## 2020-04-03 LAB — LACTIC ACID, PLASMA
Lactic Acid, Venous: 1.8 mmol/L (ref 0.5–1.9)
Lactic Acid, Venous: 1.2 mmol/L (ref 0.5–1.9)

## 2020-04-03 LAB — TROPONIN I (HIGH SENSITIVITY)
Troponin I (High Sensitivity): 352 ng/L (ref <18)
Troponin I (High Sensitivity): 393 ng/L (ref <18)

## 2020-04-03 LAB — D-DIMER, QUANTITATIVE: D-Dimer, Quant: 0.52 ug/mL-FEU — ABNORMAL HIGH (ref 0.00–0.50)

## 2020-04-03 LAB — SARS CORONAVIRUS 2 BY RT PCR (HOSPITAL ORDER, PERFORMED IN ~~LOC~~ HOSPITAL LAB): SARS Coronavirus 2: NEGATIVE

## 2020-04-03 MED ORDER — IOHEXOL 350 MG/ML SOLN
100.0000 mL | Freq: Once | INTRAVENOUS | Status: AC | PRN
  Administered 2020-04-03: 100 mL via INTRAVENOUS

## 2020-04-03 NOTE — ED Notes (Signed)
Left radial artery x 1 attempt. No complications noted. Bleeding stopped. Temp- 99.5

## 2020-04-03 NOTE — ED Notes (Signed)
Dr. Particia Nearing aware that troponin level is 393. No further orders.

## 2020-04-03 NOTE — ED Notes (Signed)
Assisted with commode.

## 2020-04-03 NOTE — ED Notes (Signed)
ABG was on 2lpm [place]- 97%

## 2020-04-03 NOTE — ED Notes (Signed)
Date and time results received: 04/03/20 1829   Test: 1829 Critical Value: trp Name of Provider Notified:Garrett PA Orders Received? Or Actions Taken?: no orders given

## 2020-04-03 NOTE — ED Triage Notes (Signed)
Pt called ems for c/o shortness of breath , headache and nausea since yesterday, neighbors found today hypoxic, cyanotic.  Pt does not utilize home oxygen.  Ems found room air sat 84% applied oxygen NRB, arrives on 6L with recovery to 97%.  Pt alert, answering questions appropriately.  Hx H1N1 flu in past which left her with scarring in lungs, intubation, chronic breathing difficulty.  Hx diabetes, copd.  Respiratory therapy at bedside on arrival to evaluate.  Room air sat 84%,  Has had covid 19 vaccine.

## 2020-04-03 NOTE — ED Provider Notes (Signed)
[facility] EMERGENCY DEPARTMENT Provider Note   CSN: [id number] Arrival date & time: 04/03/20  1704     History Chief Complaint  Patient presents with   Shortness of Breath    [name] is a 64 y.o. female with PMH significant for HTN, PE, and chronic respiratory failure resulting from H1N1 infection who presents to the ED via EMS for shortness of breath. I obtained history from EMS reports that patient has been endorsing headache, nausea, malaise, and shortness of breath. She was found by neighbors to be particularly lethargic here in her home today with mildly "bluish" discoloration. On EMS arrival, patient was 84% on RA and was subsequently placed on high flow oxygen via NRB. She has since been titrated down to 6 L supplemental O2 via NRB and is now saturating well in the 90s.   On my examination, patient is saturating well on 3 L supplemental O2 and without any significant increased work of breathing.  She reports that her history of pulmonary embolism was in the context of extended hospitalization.  Given that it was a provoked PE, she did not require chronic anticoagulation.  Patient states that for the past 3 days she has been feeling "blah", noting headache, generalized malaise, chills, and intermittent nausea symptoms.  Today she felt particularly tired.  Her daughter, paramedic, came over and noticed that she had bluish lips and was difficult to arouse.  She has never required any supplemental oxygen despite her history of chronic respiratory failure.  She will often check her pulse ox at home now and she typically saturates between 92 to 94% on RA.  Patient denies any recent fevers, chest pain, pleuritic symptoms, abdominal pain, vomiting, urinary symptoms, or changes in bowel habits.  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  No other obvious sick contacts.  Patient is fully immunized against  COVID-19.  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     Family History  Problem Relation Age of Onset   Heart disease Father    Diabetes Father    Heart disease Mother    Hypertension Mother    Hypertension Brother    Arthritis Mother    Arthritis Maternal Grandmother     Social History   Tobacco Use   Smoking status: Never Smoker   Smokeless tobacco: Never Used   Tobacco comment: never used product  Substance Use Topics   Alcohol use: No   Drug use: No    Home Medications Prior to Admission medications   Medication Sig Start Date End Date Taking? Authorizing Provider  albuterol (PROAIR HFA) 108 (90 BASE) MCG/ACT inhaler Inhale 2  puffs into the lungs as needed for wheezing or shortness of breath.     [provider]  metoprolol succinate (TOPROL-XL) 50 MG 24 hr tablet Take 50 mg by mouth daily. Take with or immediately following a meal.    [provider]  Multiple Vitamin (MULTIVITAMIN) tablet Take 1 tablet by mouth daily.    [provider]  sertraline (ZOLOFT) 50 MG tablet Take 1 tablet (50 mg total) by mouth at bedtime. 10/15/12   Angiulli, Mcarthur Rossetti, PA-C    Allergies    Aspirin and Sulfa antibiotics  Review of  Systems   Review of Systems  All other systems reviewed and are negative.   Physical Exam Updated Vital Signs BP (!) 178/110    Pulse 75    Temp 99 F (37.2 C) (Oral)    Resp 15    SpO2 97%    Physical Exam Vitals and nursing note reviewed. Exam conducted with a chaperone present.  Constitutional:      Appearance: She is obese.  HENT:     Head: Normocephalic and atraumatic.  Eyes:     General: No scleral icterus.    Conjunctiva/sclera: Conjunctivae normal.  Cardiovascular:     Rate and Rhythm: Normal rate and regular rhythm.     Pulses: Normal pulses.     Heart sounds: Normal heart sounds.  Pulmonary:     Comments: Mildly increased work of breathing.  No tachypnea.  Breath sounds intact bilaterally.  No significant rales, wheezing, or stridor.  Symmetric chest rise.  No accessory muscle use.  No distress.  On 3 L supplemental O2 via [place].   Abdominal:     General: Abdomen is flat. There is no distension.     Palpations: Abdomen is soft.     Tenderness: There is no abdominal tenderness.  Musculoskeletal:     Cervical back: Normal range of motion. No rigidity.     Right lower leg: No edema.     Left lower leg: No edema.  Skin:    General: Skin is dry.     Capillary Refill: Capillary refill takes less than 2 seconds.  Neurological:     Mental Status: She is alert and oriented to person, place, and time.     GCS: GCS eye subscore is 4. GCS verbal subscore is 5. GCS motor subscore is 6.  Psychiatric:        Mood and Affect: Mood normal.        Behavior: Behavior normal.        Thought Content: Thought content normal.     ED Results / Procedures / Treatments   Labs (all labs ordered are listed, but only abnormal results are displayed) Labs Reviewed  CBC WITH DIFFERENTIAL/PLATELET - Abnormal; Notable for the following components:      Result Value   RBC 5.17 (*)    HCT 48.1 (*)    All other components within normal limits  COMPREHENSIVE METABOLIC PANEL - Abnormal;  Notable for the following components:   Glucose, Bld 145 (*)    Calcium 8.5 (*)    AST 94 (*)    ALT 101 (*)    All other components within normal limits  D-DIMER, QUANTITATIVE (NOT AT [facility]) - Abnormal; Notable for the following components:   D-Dimer, Quant 0.52 (*)    All other components within normal limits  I-STAT ARTERIAL BLOOD GAS, ED - Abnormal; Notable for the following components:   pCO2 arterial 57.5 (*)  pO2, Arterial 142 (*)    Bicarbonate 32.6 (*)    TCO2 34 (*)    Acid-Base Excess 5.0 (*)    Hemoglobin 15.6 (*)    All other components within normal limits  TROPONIN I (HIGH SENSITIVITY) - Abnormal; Notable for the following components:   Troponin I (High Sensitivity) 352 (*)    All other components within normal limits  SARS CORONAVIRUS 2 BY RT PCR (HOSPITAL ORDER, PERFORMED IN [facility])  LACTIC ACID, PLASMA  LACTIC ACID, PLASMA  BLOOD GAS, ARTERIAL  TROPONIN I (HIGH SENSITIVITY)    EKG EKG Interpretation  Date/Time:  Sunday April 03 2020 17:28:26 EDT Ventricular Rate:  76 PR Interval:    QRS Duration: 101 QT Interval:  373 QTC Calculation: 420 R Axis:   34 Text Interpretation: Sinus rhythm Atrial premature complex Borderline repolarization abnormality Minimal ST elevation, inferior leads No old tracing to compare Confirmed by Jacalyn Lefevre [phone]) on 04/03/2020 5:37:18 PM   Radiology DG Chest Portable 1 View  Result Date: 04/03/2020 CLINICAL DATA:  Shortness of breath.  Headache and nausea. EXAM: PORTABLE CHEST 1 VIEW COMPARISON:  Most recent radiograph 07/15/2013 FINDINGS: Chronic cardiomegaly, unchanged. Areas of bilateral lung scarring that appears similar to prior exam. No evidence of acute airspace disease. Mild peribronchial thickening that may have progressed. No pleural effusion. No pneumothorax. No acute osseous abnormalities are seen. IMPRESSION: 1. Chronic cardiomegaly. Mild peribronchial thickening that may have progressed from  prior exam, may represent acute bronchitis. 2. Stable bilateral lung scarring. Electronically Signed   By: Narda Rutherford M.D.   On: 04/03/2020 18:05    Procedures Procedures (including critical care time)  Medications Ordered in ED Medications  iohexol (OMNIPAQUE) 350 MG/ML injection 100 mL (100 mLs Intravenous Contrast Given 04/03/20 1850)    ED Course  I have reviewed the triage vital signs and the nursing notes.  Pertinent labs & imaging results that were available during my care of the patient were reviewed by me and considered in my medical decision making (see chart for details).    MDM Rules/Calculators/A&P                          Labs I-STAT arterial blood gas: Mildly elevated PCO2 to 57.5, otherwise largely unremarkable.  pH WNL. Lactic acid: WNL at 1.8. D-dimer: Mildly elevated 0.02. CBC with differential: No leukocytosis concerning for infection.  No anemia. CMP: Mild transaminitis, slightly worse when compared to labs obtained 7 years ago. Troponin: 352 >>   Imaging DG chest: I personally viewed plain films obtained of chest which demonstrate chronic cardiomegaly and mild peribronchial thickening possibly concerning for acute bronchitis, no other acute pathology. CTA PE study: Pending at time of handoff.   Patient does not typically require oxygen and was found to be hypoxic to 84% on RA and required supplemental oxygen.  Given patient's elevated troponin, concern for pulmonary embolism with right heart strain.  Will obtain CTA PE testing and then ultimately admit.  At shift change care was transferred to Dr. Particia Nearing who will follow pending studies, re-evaluate, and determine disposition.     Final Clinical Impression(s) / ED Diagnoses Final diagnoses:  Hypoxia    Rx / DC Orders ED Discharge Orders    None       Elvera Maria 04/03/20 Steva Colder, MD 04/03/20 2121

## 2020-04-04 ENCOUNTER — Inpatient Hospital Stay (HOSPITAL_COMMUNITY): Payer: Self-pay

## 2020-04-04 ENCOUNTER — Encounter (HOSPITAL_BASED_OUTPATIENT_CLINIC_OR_DEPARTMENT_OTHER): Payer: Self-pay | Admitting: Family Medicine

## 2020-04-04 DIAGNOSIS — R0602 Shortness of breath: Secondary | ICD-10-CM

## 2020-04-04 DIAGNOSIS — E66813 Obesity, class 3: Secondary | ICD-10-CM | POA: Diagnosis present

## 2020-04-04 DIAGNOSIS — I509 Heart failure, unspecified: Secondary | ICD-10-CM

## 2020-04-04 DIAGNOSIS — J9601 Acute respiratory failure with hypoxia: Secondary | ICD-10-CM

## 2020-04-04 DIAGNOSIS — Z86718 Personal history of other venous thrombosis and embolism: Secondary | ICD-10-CM

## 2020-04-04 DIAGNOSIS — J9602 Acute respiratory failure with hypercapnia: Secondary | ICD-10-CM

## 2020-04-04 DIAGNOSIS — F338 Other recurrent depressive disorders: Secondary | ICD-10-CM | POA: Diagnosis present

## 2020-04-04 LAB — HIV ANTIBODY (ROUTINE TESTING W REFLEX): HIV Screen 4th Generation wRfx: NONREACTIVE

## 2020-04-04 LAB — ECHOCARDIOGRAM COMPLETE
Area-P 1/2: 2.32 cm2
S' Lateral: 2.9 cm

## 2020-04-04 LAB — TROPONIN I (HIGH SENSITIVITY): Troponin I (High Sensitivity): 231 ng/L (ref <18)

## 2020-04-04 LAB — BRAIN NATRIURETIC PEPTIDE: B Natriuretic Peptide: 204.3 pg/mL — ABNORMAL HIGH (ref 0.0–100.0)

## 2020-04-04 LAB — TSH: TSH: 4.397 u[IU]/mL (ref 0.350–4.500)

## 2020-04-04 MED ORDER — HYDRALAZINE HCL 20 MG/ML IJ SOLN: 10.0000 mg | INTRAMUSCULAR | Status: DC | PRN

## 2020-04-04 MED ORDER — SODIUM CHLORIDE 0.9% FLUSH: 3.0000 mL | INTRAVENOUS | Status: DC | PRN

## 2020-04-04 MED ORDER — PERFLUTREN LIPID MICROSPHERE
1.0000 mL | INTRAVENOUS | Status: AC | PRN
  Administered 2020-04-04: 5 mL via INTRAVENOUS
  Filled 2020-04-04: qty 10

## 2020-04-04 MED ORDER — ACETAMINOPHEN 500 MG PO TABS
1000.0000 mg | ORAL_TABLET | Freq: Once | ORAL | Status: AC
  Administered 2020-04-04: 1000 mg via ORAL
  Filled 2020-04-04: qty 2

## 2020-04-04 MED ORDER — SERTRALINE HCL 100 MG PO TABS
100.0000 mg | ORAL_TABLET | Freq: Every day | ORAL | Status: DC
  Administered 2020-04-04 – 2020-04-05 (×2): 100 mg via ORAL
  Filled 2020-04-04 (×2): qty 1

## 2020-04-04 MED ORDER — FUROSEMIDE 10 MG/ML IJ SOLN
40.0000 mg | Freq: Two times a day (BID) | INTRAMUSCULAR | Status: DC
  Administered 2020-04-04: 40 mg via INTRAVENOUS
  Filled 2020-04-04: qty 4

## 2020-04-04 MED ORDER — ENOXAPARIN SODIUM 40 MG/0.4ML ~~LOC~~ SOLN
40.0000 mg | SUBCUTANEOUS | Status: DC
  Administered 2020-04-04 – 2020-04-05 (×2): 40 mg via SUBCUTANEOUS
  Filled 2020-04-04 (×2): qty 0.4

## 2020-04-04 MED ORDER — SODIUM CHLORIDE 0.9 % IV SOLN: 250.0000 mL | INTRAVENOUS | Status: DC | PRN

## 2020-04-04 MED ORDER — ONDANSETRON HCL 4 MG/2ML IJ SOLN: 4.0000 mg | Freq: Four times a day (QID) | INTRAMUSCULAR | Status: DC | PRN

## 2020-04-04 MED ORDER — ACETAMINOPHEN 325 MG PO TABS
650.0000 mg | ORAL_TABLET | ORAL | Status: DC | PRN
  Administered 2020-04-04: 650 mg via ORAL
  Filled 2020-04-04: qty 2

## 2020-04-04 MED ORDER — SODIUM CHLORIDE 0.9% FLUSH
3.0000 mL | Freq: Two times a day (BID) | INTRAVENOUS | Status: DC
  Administered 2020-04-04 – 2020-04-06 (×2): 3 mL via INTRAVENOUS

## 2020-04-04 MED ORDER — METOPROLOL SUCCINATE ER 100 MG PO TB24
100.0000 mg | ORAL_TABLET | Freq: Two times a day (BID) | ORAL | Status: DC
  Administered 2020-04-04: 100 mg via ORAL
  Filled 2020-04-04: qty 1

## 2020-04-04 MED ORDER — LOSARTAN POTASSIUM 25 MG PO TABS
25.0000 mg | ORAL_TABLET | Freq: Every day | ORAL | Status: DC
  Administered 2020-04-04 – 2020-04-05 (×2): 25 mg via ORAL
  Filled 2020-04-04 (×3): qty 1

## 2020-04-04 NOTE — H&P (Addendum)
History and Physical    [name] ZOX:[id number] DOB: 1956/06/15 DOA: 04/03/2020  Referring MD/NP/PA: Odie Sera, MD PCP: Johny Blamer, MD  Patient coming from: Transfer from [facility]  Chief Complaint: Shortness of breath  I have personally briefly reviewed patient's old medical records in [facility]   HPI: [name] is a 64 y.o. 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  Over the last few days patient reported that she had not been feeling well.  She had been complaining of headache and had been altered.  When they checked her oxygenation at home it was as low as 51% and her lips were blue.  Denied having any complaints of chest pain, leg swelling, orthopnea, cough, nausea, vomiting, fever, or chills.  He had been previously following with Dr. Jetty Duhamel of pulmonology for what patient states is COPD related to history of H1 N1.  During the hospitalization patient developed bilateral lower extremity DVTs and PE and was on Xarelto for some time thereafter.  However, patient reports that she lost her insurance and had not seen Dr. Maple Hudson in several years now.   ED Course: Upon admission into the emergency department patient was seen to be afebrile with blood pressures elevated up to 194/112, and O2 saturations noted to be as low as 84% on room air and currently maintained on 2 L nasal cannula oxygen.  ABG revealed pH 7.364, PCO2 57.4, and PO2 142.  Labs are significant for high-sensitivity troponin 352-> 393-> 231.  CT angiogram of the chest showed no signs of a pulmonary embolus and possible mild edema.  Review of Systems  Constitutional: Positive for malaise/fatigue. Negative for fever.  HENT: Negative for congestion and nosebleeds.   Eyes: Negative for photophobia and pain.  Respiratory: Positive  for shortness of breath. Negative for cough.   Cardiovascular: Negative for chest pain and orthopnea.  Gastrointestinal: Negative for abdominal pain and vomiting.  Genitourinary: Negative for dysuria and hematuria.  Musculoskeletal: Negative for joint pain and myalgias.  Skin: Negative for itching.  Neurological: Positive for headaches. Negative for loss of consciousness.  Psychiatric/Behavioral: Negative for memory loss and substance abuse.    Past Medical History:  Diagnosis Date  . COPD (chronic obstructive pulmonary disease) (HCC)   . DVT (deep venous thrombosis) (HCC)   . Hypertension     Past Surgical History:  Procedure Laterality Date  . CYST EXCISION  1980   Spinal cyst  . KNEE ARTHROSCOPY     bilateral  . TONSILLECTOMY  1980     reports that she has never smoked. She has never used smokeless tobacco. She reports that she does not drink alcohol and does not use drugs.  Allergies  Allergen Reactions  . Aspirin Nausea And Vomiting  . Sulfa Antibiotics Nausea Only    Family History  Problem Relation Age of Onset  . Heart disease Father   . Diabetes Father   . Heart disease Mother   . Hypertension Mother   . Arthritis Mother   . Hypertension Brother   . Arthritis Maternal Grandmother     Prior to Admission medications   Medication Sig Start Date End Date Taking? Authorizing Provider  losartan (COZAAR) 25 MG tablet Take 25 mg by mouth at bedtime.   Yes [provider]  metoprolol succinate (TOPROL-XL) 50 MG 24 hr tablet  Take 100 mg by mouth in the morning and at bedtime. Take with or immediately following a meal.     [provider]  Multiple Vitamin (MULTIVITAMIN) tablet Take 1 tablet by mouth daily.    [provider]  sertraline (ZOLOFT) 50 MG tablet Take 1 tablet (50 mg total) by mouth at bedtime. Patient taking differently: Take 100 mg by mouth at bedtime.  10/15/12   Angiulli, Mcarthur Rossetti, PA-C    Physical Exam:  Constitutional:  Morbidly obese female currently in NAD, calm, comfortable Vitals:   04/04/20 1000 04/04/20 1100 04/04/20 1130 04/04/20 1256  BP: (!) 147/75 129/76 (!) 146/84 (!) 188/95  Pulse: 62 (!) 59 (!) 57 (!) 56  Resp: 18   16  Temp:    98.4 F (36.9 C)  TempSrc:    Oral  SpO2: 100% 99% 100% 95%   Eyes: PERRL, lids and conjunctivae normal ENMT: Mucous membranes are moist. Posterior pharynx clear of any exudate or lesions.  Neck: normal, supple, no masses, no thyromegaly Respiratory: Decreased overall aeration but no significant wheezes or rhonchi appreciated.  Currently on 2 L nasal cannula oxygen able to talk in complete sentences. Cardiovascular: Bradycardic, no murmurs / rubs / gallops. No extremity edema. 2+ pedal pulses. No carotid bruits.  Abdomen: no tenderness, no masses palpated. No hepatosplenomegaly. Bowel sounds positive.  Musculoskeletal: no clubbing / cyanosis. No joint deformity upper and lower extremities. Good ROM, no contractures. Normal muscle tone.  Skin: no rashes, lesions, ulcers. No induration Neurologic: CN 2-12 grossly intact. Sensation intact, DTR normal. Strength 5/5 in all 4.  Psychiatric: Normal judgment and insight. Alert and oriented x 3. Normal mood.     Labs on Admission: I have personally reviewed following labs and imaging studies  CBC: Recent Labs  Lab 04/03/20 1723 04/03/20 1816  WBC 7.7  --   NEUTROABS 6.2  --   HGB 15.0 15.6*  HCT 48.1* 46.0  MCV 93.0  --   PLT 231  --    Basic Metabolic Panel: Recent Labs  Lab 04/03/20 1723 04/03/20 1816  NA 137 140  K 4.7 4.6  CL 100  --   CO2 30  --   GLUCOSE 145*  --   BUN 16  --   CREATININE 0.82  --   CALCIUM 8.5*  --    GFR: CrCl cannot be calculated (Unknown ideal weight.). Liver Function Tests: Recent Labs  Lab 04/03/20 1723  AST 94*  ALT 101*  ALKPHOS 82  BILITOT 0.4  PROT 6.6  ALBUMIN 3.6   No results for input(s): LIPASE, AMYLASE in the last 168 hours. No results for input(s):  AMMONIA in the last 168 hours. Coagulation Profile: No results for input(s): INR, PROTIME in the last 168 hours. Cardiac Enzymes: No results for input(s): CKTOTAL, CKMB, CKMBINDEX, TROPONINI in the last 168 hours. BNP (last 3 results) No results for input(s): PROBNP in the last 8760 hours. HbA1C: No results for input(s): HGBA1C in the last 72 hours. CBG: No results for input(s): GLUCAP in the last 168 hours. Lipid Profile: No results for input(s): CHOL, HDL, LDLCALC, TRIG, CHOLHDL, LDLDIRECT in the last 72 hours. Thyroid Function Tests: No results for input(s): TSH, T4TOTAL, FREET4, T3FREE, THYROIDAB in the last 72 hours. Anemia Panel: No results for input(s): VITAMINB12, FOLATE, FERRITIN, TIBC, IRON, RETICCTPCT in the last 72 hours. Urine analysis:    Component Value Date/Time   COLORURINE ORANGE (A) 08/09/2012 2114   APPEARANCEUR TURBID (A) 08/09/2012 2114  LABSPEC 1.023 08/09/2012 2114   PHURINE 5.0 08/09/2012 2114   GLUCOSEU NEGATIVE 08/09/2012 2114   HGBUR LARGE (A) 08/09/2012 2114   BILIRUBINUR MODERATE (A) 08/09/2012 2114   KETONESUR 15 (A) 08/09/2012 2114   PROTEINUR 30 (A) 08/09/2012 2114   UROBILINOGEN 1.0 08/09/2012 2114   NITRITE POSITIVE (A) 08/09/2012 2114   LEUKOCYTESUR LARGE (A) 08/09/2012 2114   Sepsis Labs: Recent Results (from the past 240 hour(s))  SARS Coronavirus 2 by RT PCR (hospital order, performed in [facility] hospital lab) Nasopharyngeal Nasopharyngeal Swab     Status: None   Collection Time: 04/03/20  5:19 PM   Specimen: Nasopharyngeal Swab  Result Value Ref Range Status   SARS Coronavirus 2 NEGATIVE NEGATIVE Final    Comment: (NOTE) SARS-CoV-2 target nucleic acids are NOT DETECTED.  The SARS-CoV-2 RNA is generally detectable in upper and lower respiratory specimens during the acute phase of infection. The lowest concentration of SARS-CoV-2 viral copies this assay can detect is 250 copies / mL. A negative result does not preclude SARS-CoV-2  infection and should not be used as the sole basis for treatment or other patient management decisions.  A negative result may occur with improper specimen collection / handling, submission of specimen other than nasopharyngeal swab, presence of viral mutation(s) within the areas targeted by this assay, and inadequate number of viral copies (<250 copies / mL). A negative result must be combined with clinical observations, patient history, and epidemiological information.  Fact Sheet for Patients:   BoilerBrush.com.cy  Fact Sheet for Healthcare Providers: https://pope.com/  This test is not yet approved or  cleared by the [place] FDA and has been authorized for detection and/or diagnosis of SARS-CoV-2 by FDA under an Emergency Use Authorization (EUA).  This EUA will remain in effect (meaning this test can be used) for the duration of the COVID-19 declaration under Section 564(b)(1) of the Act, 21 U.S.C. section 360bbb-3(b)(1), unless the authorization is terminated or revoked sooner.  Performed at [facility], [address] [place], [place] [address]      Radiological Exams on Admission: CT Angio Chest PE W and/or Wo Contrast  Result Date: 04/03/2020 CLINICAL DATA:  Shortness of breath fever EXAM: CT ANGIOGRAPHY CHEST WITH CONTRAST TECHNIQUE: Multidetector CT imaging of the chest was performed using the standard protocol during bolus administration of intravenous contrast. Multiplanar CT image reconstructions and MIPs were obtained to evaluate the vascular anatomy. CONTRAST:  OMNIPAQUE IOHEXOL 350 MG/ML SOLN COMPARISON:  None. FINDINGS: Cardiovascular: There is a optimal opacification of the pulmonary arteries. There is no central,segmental, or subsegmental filling defects within the pulmonary arteries. There is mild cardiomegaly. No pericardial effusion or thickening. No evidence right heart strain. There is  normal three-vessel brachiocephalic anatomy without proximal stenosis. The thoracic aorta is normal in appearance. Mediastinum/Nodes: No hilar, mediastinal, or axillary adenopathy. Thyroid gland, trachea, and esophagus demonstrate no significant findings. Lungs/Pleura: There is mild peripheral upper lobe ground-glass opacity in interlobular septal thickening. No pleural effusion or pneumothorax. No airspace consolidation. Upper Abdomen: No acute abnormalities present in the visualized portions of the upper abdomen. Musculoskeletal: No chest wall abnormality. No acute or significant osseous findings. Review of the MIP images confirms the above findings. IMPRESSION: No central, segmental, or subsegmental pulmonary embolism. Mild cardiomegaly Minimal bilateral ground-glass opacities in the upper lobe which may be due to mild interstitial edema or atelectasis. Electronically Signed   By: Jonna Clark M.D.   On: 04/03/2020 19:20   DG  Chest Portable 1 View  Result Date: 04/03/2020 CLINICAL DATA:  Shortness of breath.  Headache and nausea. EXAM: PORTABLE CHEST 1 VIEW COMPARISON:  Most recent radiograph 07/15/2013 FINDINGS: Chronic cardiomegaly, unchanged. Areas of bilateral lung scarring that appears similar to prior exam. No evidence of acute airspace disease. Mild peribronchial thickening that may have progressed. No pleural effusion. No pneumothorax. No acute osseous abnormalities are seen. IMPRESSION: 1. Chronic cardiomegaly. Mild peribronchial thickening that may have progressed from prior exam, may represent acute bronchitis. 2. Stable bilateral lung scarring. Electronically Signed   By: Narda RutherfordMelanie  Sanford M.D.   On: 04/03/2020 18:05    EKG: Independently reviewed.  Sinus rhythm at 76 bpm with ST elevation in inferior leads  Assessment/Plan Acute respiratory failure with hypoxia and hypercapnia Question congestive heart failure exacerbation: Patient present with complaints of being altered and was found to  behypoxic down to 84% on room air with improvement on 2 L nasal cannula oxygen. CT angiogram of the chest showed no signs of PE, but there was concern for the possibility of mild interstitial edema.    -Admit to a cardiac telemetry bed -Heart failure order set utilized -Strict intake and output -Daily weight -Check BNP -Furosemide 40 mg IV x 1 ddose -Check echocardiogram -Continue  ARB  Elevated troponin: Acute.  High-sensitivity troponin 352-> 393-> 321.  Patient denied any reports of chest pain.  -Check lipid panel in a.m. -Plan for cardiac stress test in a.m. -N.p.o. after midnight   Hypertensive urgency: Acute.  On admission blood pressures initially elevated up to 194/112.  Home blood pressure medications appear to include metoprolol 100 mg twice daily and losartan nightly -Continue losartan  -Hold metoprolol for stress test   -Hydralazine IV as needed for elevated blood pressure greater than 180  Seasonal depression -Continue current regimen  History of PE/DVT: Patient reports prior history of DVT and PE that was thought to be provoked during a prolonged hospitalization after developing H1 N1 back in 2014.  She had been followed in outpatient setting by Dr. Maple HudsonYoung of pulmonology, but lost insurance.  Morbid obesity: BMI 44.99 kg/m -Continue counseling on need of weight loss through diet and exercise -Check continuous pulse oximetry overnight  DVT prophylaxis: lovenox  Code Status: Full Family Communication: Husband updated over the phone Disposition Plan: Possible discharge home when 2 to 3 days depending on cardiac work-up Consults called: Cardiology Admission status: inpatient   Clydie Braunondell A Domingos Riggi MD [facility] Pager [phone]   If 7PM-7AM, please contact night-coverage [email or web address] Password [email or web address]  04/04/2020, 1:01 PM

## 2020-04-04 NOTE — Progress Notes (Signed)
Echocardiogram 2D Echocardiogram has been performed.  [name] 04/04/2020, 2:08 PM

## 2020-04-04 NOTE — Consult Note (Addendum)
Cardiology Consultation:   Patient ID: [name] MRN: [id number]; DOB: 05-10-1956  Admit date: 04/03/2020 Date of Consult: 04/04/2020  Primary Care Provider: Johny Blamer, MD [facility] Cardiologist: New - Dr[facility] Electrophysiologist:  None    Patient Profile:   [name] is a 64 y.o. 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 Katrinka Blazing.  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  Patient had a extended hospital stay in January 2014 due to pneumonia and H1N1 flu.  Hospital course was complicated by ARDS, AKI requiring CVVHD and PE.  She was discharged on Xarelto which was discontinued after she has completed a course for provoked PE after discharge, she was followed by pulmonology service for a while before lost to follow-up.  She was last seen by Dr. Jetty Duhamel of pulmonology service on 01/05/2014.  He suspected the patient likely has either chronic bronchitis versus postinflammatory fibrosis.  Weight loss was recommended.   Over the years, she has been diagnosed with hypertension and has been placed on metoprolol and losartan.  She also takes sertraline for situational depression.  Patient was sent to the hospital ED by her daughter who is a paramedic after she found her unable to be awakened.  According to the ED note, patient appears to be blue with by her daughter.  Initial O2 saturation was in the 50s.  By the time EMS arrived, O2 saturation was 84% and she was more alert.  On arrival to the ED, her O2 saturation was 88% and she was placed on nasal cannula with improvement.  She did receive COVID-19 vaccine.  Covid test was negative.  Blood gas showed a pH  7.364, PCO2 57.5.  Initial troponin on arrival was 352-->393-->231.  Lactic acid normal at 1.2.  Hemoglobin 13.  White blood cell count normal at 7.7.  BNP borderline elevated at 204.3.  EKG shows sinus rhythm with PACs, mild T wave inversion in V1 through V3.  Chest x-ray showed chronic cardiomegaly, mild peribronchial thickening which may represent acute bronchitis, stable bilateral lung scarring.  CTA of the chest showed no PE, minimal bilateral groundglass opacity in the upper lobe which may be due to mild interstitial edema or atelectasis.  Due to concern of heart failure, patient has been admitted and placed on IV diuretic.  Echocardiogram obtained on 04/04/2020 showed EF 50 to 55%, grade 1 DD, moderate hypokinesis of the left ventricular, mid apical anteroseptal wall and apical segment, no significant valve issue.  Cardiology has been consulted for elevated troponin.   Past Medical History:  Diagnosis Date  . COPD (chronic obstructive pulmonary disease) (HCC)   . DVT (deep venous thrombosis) (HCC)   . Hypertension     Past Surgical History:  Procedure Laterality Date  . CYST EXCISION  1980   Spinal cyst  . KNEE ARTHROSCOPY     bilateral  . TONSILLECTOMY  1980     Home Medications:  Prior to Admission medications   Medication Sig Start Date End Date Taking? Authorizing Provider  losartan (COZAAR) 25 MG tablet Take 25 mg by mouth at bedtime.   Yes [provider]  metoprolol succinate (TOPROL-XL) 100 MG 24 hr tablet Take 100 mg by mouth in the morning  and at bedtime. Take with or immediately following a meal.   Yes [provider]  sertraline (ZOLOFT) 50 MG tablet Take 1 tablet (50 mg total) by mouth at bedtime. Patient taking differently: Take 100 mg by mouth at bedtime.  10/15/12  Yes Angiulli, Mcarthur Rossetti, PA-C  metoprolol succinate (TOPROL-XL) 50 MG 24 hr tablet Take 100 mg by mouth in the morning and at bedtime. Take with or immediately following a meal.  Patient not  taking: Reported on 04/04/2020    [provider]    Inpatient Medications: Scheduled Meds: . enoxaparin (LOVENOX) injection  40 mg Subcutaneous Q24H  . furosemide  40 mg Intravenous BID  . losartan  25 mg Oral QHS  . metoprolol succinate  100 mg Oral BID  . sertraline  100 mg Oral QHS  . sodium chloride flush  3 mL Intravenous Q12H   Continuous Infusions: . sodium chloride     PRN Meds: sodium chloride, acetaminophen, ondansetron (ZOFRAN) IV, sodium chloride flush  Allergies:    Allergies  Allergen Reactions  . Aspirin Nausea And Vomiting  . Sulfa Antibiotics Nausea Only    Social History:   Social History   Socioeconomic History  . Marital status: Married    Spouse name: Not on file  . Number of children: Not on file  . Years of education: Not on file  . Highest education level: Not on file  Occupational History  . Not on file  Tobacco Use  . Smoking status: Never Smoker  . Smokeless tobacco: Never Used  . Tobacco comment: never used product  Substance and Sexual Activity  . Alcohol use: No  . Drug use: No  . Sexual activity: Yes    Birth control/protection: None  Other Topics Concern  . Not on file  Social History Narrative   Married   3 children   4 yo grandson who lives with them most of the time   Social Determinants of Health   Financial Resource Strain:   . Difficulty of Paying Living Expenses: Not on file  Food Insecurity:   . Worried About Programme researcher, broadcasting/film/video in the Last Year: Not on file  . Ran Out of Food in the Last Year: Not on file  Transportation Needs:   . Lack of Transportation (Medical): Not on file  . Lack of Transportation (Non-Medical): Not on file  Physical Activity:   . Days of Exercise per Week: Not on file  . Minutes of Exercise per Session: Not on file  Stress:   . Feeling of Stress : Not on file  Social Connections:   . Frequency of Communication with Friends and Family: Not on file  . Frequency of Social  Gatherings with Friends and Family: Not on file  . Attends Religious Services: Not on file  . Active Member of Clubs or Organizations: Not on file  . Attends Banker Meetings: Not on file  . Marital Status: Not on file  Intimate Partner Violence:   . Fear of Current or Ex-Partner: Not on file  . Emotionally Abused: Not on file  . Physically Abused: Not on file  . Sexually Abused: Not on file    Family History:    Family History  Problem Relation Age of Onset  . Heart disease Father   . Diabetes Father   . Hypertension Mother   . Arthritis Mother   . Hypertension Brother   . Arthritis Maternal Grandmother      ROS:  Please see the history of present illness.   All other ROS reviewed and negative.     Physical Exam/Data:   Vitals:   04/04/20 1130 04/04/20 1256 04/04/20 1303 04/04/20 1515  BP: (!) 146/84 (!) 188/95 (!) 173/106 (!) 143/91  Pulse: (!) 57 (!) 56 64 62  Resp:  16    Temp:  98.4 F (36.9 C)    TempSrc:  Oral    SpO2: 100% 95%    Weight:   130.3 kg   Height:   5\' 7"  (1.702 m)     Intake/Output Summary (Last 24 hours) at 04/04/2020 1632 Last data filed at 04/04/2020 1424 Gross per 24 hour  Intake --  Output 200 ml  Net -200 ml   Last 3 Weights 04/04/2020 01/05/2014 09/11/2013  Weight (lbs) 287 lb 4.2 oz 266 lb 6.4 oz 238 lb  Weight (kg) 130.3 kg 120.838 kg 107.956 kg     Body mass index is 44.99 kg/m.  General:  Well nourished, well developed, in no acute distress HEENT: normal Lymph: no adenopathy Neck: no JVD Endocrine:  No thryomegaly Vascular: No carotid bruits; FA pulses 2+ bilaterally without bruits  Cardiac:  normal S1, S2; RRR; no murmur  Lungs:  clear to auscultation bilaterally, no wheezing, rhonchi or rales  Abd: soft, nontender, no hepatomegaly  Ext: no edema Musculoskeletal:  No deformities, BUE and BLE strength normal and equal Skin: warm and dry  Neuro:  CNs 2-12 intact, no focal abnormalities noted Psych:  Normal affect    EKG:  The EKG was personally reviewed and demonstrates:  NSR with PACs and mild TWI in the anterior leads Telemetry:  Telemetry was personally reviewed and demonstrates:  NSR with HR 50-60s.   Relevant CV Studies:  Echo 04/04/2020  1. Very technically difficult study, even with Definity contrast - there  may be an anteroapical/distal anteroseptal WMA. LVEF appears relatively  preserved.. Left ventricular ejection fraction, by estimation, is 50 to  55%. The left ventricle has low  normal function. The left ventricle demonstrates regional wall motion  abnormalities (see scoring diagram/findings for description). There is  mild left ventricular hypertrophy. Left ventricular diastolic parameters  are consistent with Grade I diastolic  dysfunction (impaired relaxation). There is moderate hypokinesis of the  left ventricular, mid-apical anteroseptal wall and apical segment.  2. Right ventricular systolic function is normal. The right ventricular  size is normal.  3. Left atrial size was mildly dilated.  4. The mitral valve was not well visualized. No evidence of mitral valve  regurgitation.  5. The aortic valve is tricuspid. Aortic valve regurgitation is not  visualized.  6. The inferior vena cava is normal in size with greater than 50%  respiratory variability, suggesting right atrial pressure of 3 mmHg.   Laboratory Data:  High Sensitivity Troponin:   Recent Labs  Lab 04/03/20 1723 04/03/20 1920 04/04/20 0041  TROPONINIHS 352* 393* 231*     Chemistry Recent Labs  Lab 04/03/20 1723 04/03/20 1816  NA 137 140  K 4.7 4.6  CL 100  --   CO2 30  --   GLUCOSE 145*  --   BUN 16  --   CREATININE 0.82  --   CALCIUM 8.5*  --   GFRNONAA >60  --   GFRAA >60  --   ANIONGAP 7  --     Recent Labs  Lab 04/03/20 1723  PROT 6.6  ALBUMIN 3.6  AST 94*  ALT 101*  ALKPHOS 82  BILITOT 0.4   Hematology Recent Labs  Lab 04/03/20 1723 04/03/20 1816  WBC 7.7  --   RBC  5.17*  --   HGB 15.0 15.6*  HCT 48.1* 46.0  MCV 93.0  --   MCH 29.0  --   MCHC 31.2  --   RDW 13.3  --   PLT 231  --    BNP Recent Labs  Lab 04/04/20 1345  BNP 204.3*    DDimer  Recent Labs  Lab 04/03/20 1723  DDIMER 0.52*     Radiology/Studies:  CT Angio Chest PE W and/or Wo Contrast  Result Date: 04/03/2020 CLINICAL DATA:  Shortness of breath fever EXAM: CT ANGIOGRAPHY CHEST WITH CONTRAST TECHNIQUE: Multidetector CT imaging of the chest was performed using the standard protocol during bolus administration of intravenous contrast. Multiplanar CT image reconstructions and MIPs were obtained to evaluate the vascular anatomy. CONTRAST:  OMNIPAQUE IOHEXOL 350 MG/ML SOLN COMPARISON:  None. FINDINGS: Cardiovascular: There is a optimal opacification of the pulmonary arteries. There is no central,segmental, or subsegmental filling defects within the pulmonary arteries. There is mild cardiomegaly. No pericardial effusion or thickening. No evidence right heart strain. There is normal three-vessel brachiocephalic anatomy without proximal stenosis. The thoracic aorta is normal in appearance. Mediastinum/Nodes: No hilar, mediastinal, or axillary adenopathy. Thyroid gland, trachea, and esophagus demonstrate no significant findings. Lungs/Pleura: There is mild peripheral upper lobe ground-glass opacity in interlobular septal thickening. No pleural effusion or pneumothorax. No airspace consolidation. Upper Abdomen: No acute abnormalities present in the visualized portions of the upper abdomen. Musculoskeletal: No chest wall abnormality. No acute or significant osseous findings. Review of the MIP images confirms the above findings. IMPRESSION: No central, segmental, or subsegmental pulmonary embolism. Mild cardiomegaly Minimal bilateral ground-glass opacities in the upper lobe which may be due to mild interstitial edema or atelectasis. Electronically Signed   By: Jonna Clark M.D.   On: 04/03/2020  19:20   DG Chest Portable 1 View  Result Date: 04/03/2020 CLINICAL DATA:  Shortness of breath.  Headache and nausea. EXAM: PORTABLE CHEST 1 VIEW COMPARISON:  Most recent radiograph 07/15/2013 FINDINGS: Chronic cardiomegaly, unchanged. Areas of bilateral lung scarring that appears similar to prior exam. No evidence of acute airspace disease. Mild peribronchial thickening that may have progressed. No pleural effusion. No pneumothorax. No acute osseous abnormalities are seen. IMPRESSION: 1. Chronic cardiomegaly. Mild peribronchial thickening that may have progressed from prior exam, may represent acute bronchitis. 2. Stable bilateral lung scarring. Electronically Signed   By: Narda Rutherford M.D.   On: 04/03/2020 18:05   ECHOCARDIOGRAM COMPLETE  Result Date: 04/04/2020    ECHOCARDIOGRAM REPORT   Patient Name:   ELENI FRANK  Date of Exam: 04/04/2020 Medical Rec #:  [id number]  Height:       67.0 in Accession #:    [id number] Weight:       266.4 lb Date of Birth:  1955-11-05 BSA:          2.284 m Patient Age:    63 years   BP:           173/106 mmHg Patient Gender: F          HR:           62 bpm. Exam Location:  Inpatient Procedure: 2D Echo, Color Doppler, Cardiac Doppler and Intracardiac            Opacification Agent Indications:    Elevated Troponin  History:  Patient has no prior history of Echocardiogram examinations.                 Risk Factors:Hypertension.  Sonographer:    Irving Burton Senior RDCS Referring Phys: [phone] RONDELL A SMITH  Sonographer Comments: Technically difficult due to patient body habitus. IMPRESSIONS  1. Very technically difficult study, even with Definity contrast - there may be an anteroapical/distal anteroseptal WMA. LVEF appears relatively preserved.. Left ventricular ejection fraction, by estimation, is 50 to 55%. The left ventricle has low normal function. The left ventricle demonstrates regional wall motion abnormalities (see scoring diagram/findings for description). There is  mild left ventricular hypertrophy. Left ventricular diastolic parameters are consistent with Grade I diastolic dysfunction (impaired relaxation). There is moderate hypokinesis of the left ventricular, mid-apical anteroseptal wall and apical segment.  2. Right ventricular systolic function is normal. The right ventricular size is normal.  3. Left atrial size was mildly dilated.  4. The mitral valve was not well visualized. No evidence of mitral valve regurgitation.  5. The aortic valve is tricuspid. Aortic valve regurgitation is not visualized.  6. The inferior vena cava is normal in size with greater than 50% respiratory variability, suggesting right atrial pressure of 3 mmHg. FINDINGS  Left Ventricle: Very technically difficult study, even with Definity contrast - there may be an anteroapical/distal anteroseptal WMA. LVEF appears relatively preserved. Left ventricular ejection fraction, by estimation, is 50 to 55%. The left ventricle has low normal function. The left ventricle demonstrates regional wall motion abnormalities. Moderate hypokinesis of the left ventricular, mid-apical anteroseptal wall and apical segment. Definity contrast agent was given IV to delineate the left ventricular endocardial borders. The left ventricular internal cavity size was normal in size. There is mild left ventricular hypertrophy. Left ventricular diastolic parameters are consistent with Grade I diastolic dysfunction (impaired relaxation). Indeterminate filling pressures. Right Ventricle: The right ventricular size is normal. No increase in right ventricular wall thickness. Right ventricular systolic function is normal. Left Atrium: Left atrial size was mildly dilated. Right Atrium: Right atrial size was normal in size. Pericardium: There is no evidence of pericardial effusion. Mitral Valve: The mitral valve was not well visualized. No evidence of mitral valve regurgitation. Tricuspid Valve: The tricuspid valve is not well  visualized. Tricuspid valve regurgitation is not demonstrated. Aortic Valve: The aortic valve is tricuspid. Aortic valve regurgitation is not visualized. Pulmonic Valve: The pulmonic valve was normal in structure. Pulmonic valve regurgitation is not visualized. Aorta: The aortic root and ascending aorta are structurally normal, with no evidence of dilitation. Venous: The inferior vena cava is normal in size with greater than 50% respiratory variability, suggesting right atrial pressure of 3 mmHg. IAS/Shunts: No atrial level shunt detected by color flow Doppler.  LEFT VENTRICLE PLAX 2D LVIDd:         4.90 cm  Diastology LVIDs:         2.90 cm  LV e' lateral:   4.68 cm/s LV PW:         1.10 cm  LV E/e' lateral: 12.3 LV IVS:        1.10 cm  LV e' medial:    4.79 cm/s LVOT diam:     2.00 cm  LV E/e' medial:  12.0 LV SV:         66 LV SV Index:   29 LVOT Area:     3.14 cm  RIGHT VENTRICLE RV S prime:     15.90 cm/s TAPSE (M-mode): 3.0 cm LEFT ATRIUM  Index       RIGHT ATRIUM           Index LA diam:        3.80 cm 1.66 cm/m  RA Area:     20.00 cm LA Vol (A2C):   89.3 ml 39.09 ml/m RA Volume:   56.70 ml  24.82 ml/m LA Vol (A4C):   77.5 ml 33.93 ml/m LA Biplane Vol: 84.9 ml 37.17 ml/m  AORTIC VALVE LVOT Vmax:   98.30 cm/s LVOT Vmean:  66.400 cm/s LVOT VTI:    0.209 m  AORTA Ao Root diam: 2.90 cm Ao Asc diam:  3.50 cm MITRAL VALVE MV Area (PHT): 2.32 cm    SHUNTS MV Decel Time: 327 msec    Systemic VTI:  0.21 m MV E velocity: 57.40 cm/s  Systemic Diam: 2.00 cm MV A velocity: 80.60 cm/s MV E/A ratio:  0.71 Maria Shutter MD Electronically signed by Maria Shutter MD Signature Date/Time: 04/04/2020/2:37:33 PM    Final        TIMI Risk Score for Unstable Angina or Non-ST Elevation MI:   The patient's TIMI risk score is 1, which indicates a 5% risk of all cause mortality, new or recurrent myocardial infarction or need for urgent revascularization in the next 14 days.   New York Heart Association (NYHA)  Functional Class NYHA Class I  Assessment and Plan:   1. Elevated troponin: Likely demand ischemia in the setting of hypoxia.  No clear sign of ACS  2. Abnormal echocardiogram: Echocardiogram obtained on 04/04/2020 showed EF 50 to 55%, grade 1 DD, moderate hypokinesis of the left ventricular, mid apical anteroseptal wall and apical segment, no significant valve issue.  Despite so, patient has no recent symptom of exertional chest pain or shortness of breath with exertion.  EKG showed sinus rhythm with PACs and mild TWI in the anterior leads V1-V3. Given lack of anginal symptom, will discuss with MD with regard to possible 2-day Myoview  3. Low O2 saturation: Patient was hard to arouse yesterday by family member. I suspect patient likely has obstructive sleep apnea with nocturnal hypoxia, she will need a formal sleep study with O2 sat.  4. Possible heart failure: Although BNP was borderline elevated at 204.  On exam, she appears to be euvolemic with clear lung.  CT image showed possible opacity in upper lobe which is inconsistent with location of normal heart failure which is usually resulted in the base of the lung. Continue on IV diuretic, although she may not need long term oral diuretic.   5. 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  She will need a repeat pulmonary function test either as inpatient or outpatient.   6. Hypertension: Her blood pressure was elevated on arrival, however blood pressure is coming back down after resuming home medication.      For questions or updates, please contact [facility] Please consult [email or web address] for contact info under    Signed, Azalee Course, [name]  04/04/2020 4:32 PM  History and all data above reviewed.  Patient examined.   The patient presents with an episode of unresponsiveness.  She has no past cardiac history.  She was found by her daughter to be difficult to arouse and with low O2 sats.  She  says that prior to this she had been feeling well.  She denies chest pain, PND or orthopnea.  She has some baseline dyspnea since having H1N1 a few years ago.  She has been  told that she has COPD but has not routinely seen pulmonary.  She is active and ambulates and does her household chores without limits.  Her daughter thinks that she might have sleep apnea as she snores and stops breathing.  She has no chest pain, neck or arm pain.  She does not report palpitations, presyncope or syncope.  Her EKG was non specific.  She had an echo and I reviewed these images.  It was very difficult to see the LV in all views and I could not exclude some apical wall motion abnormalities.  She did have a mildly elevated hs Trop as above.   I agree with the findings as above.  The patient exam reveals COR:RRR  ,  Lungs: Clear  ,  Abd: Positive bowel sounds, no rebound no guarding, Ext No edema  .  All available labs, radiology testing, previous records reviewed. Agree with documented assessment and plan.   Unresponsiveness:  I suspect that this was related to sleep apnea.  However, there are subtle EKG changes, questionable wall motion abnormalities on echo and elevated trop.  We need to exclude obstructive CAD and an atypical presentation.  I will order a two day Lexiscan Myoview.  At this point, I don't strongly suspect HF.  She is oxygenating well.  I personally reviewed the CT and CXR and I am not impressed that there is significant evidence of acute pulmonary edema.  Plan work up as above.  Snoring:  She will need an out patient sleep study and could have in patient overnight oxymetry.    Fayrene FearingJames Jaquell Seddon  4:50 PM  04/04/2020

## 2020-04-04 NOTE — Progress Notes (Signed)
Patient placed on overnight pulse ox. Sats  97 and pulse 56. RT will continue to monitor.

## 2020-04-04 NOTE — ED Notes (Signed)
Pt requesting her home meds. States she takes Metoprolol 100 mg BID, Losartan 25mg , Sertraline 100mg  qHS.

## 2020-04-04 NOTE — ED Notes (Signed)
Report given to Health RN at [facility]

## 2020-04-05 ENCOUNTER — Inpatient Hospital Stay (HOSPITAL_COMMUNITY): Payer: Self-pay

## 2020-04-05 DIAGNOSIS — Z86718 Personal history of other venous thrombosis and embolism: Secondary | ICD-10-CM

## 2020-04-05 DIAGNOSIS — R079 Chest pain, unspecified: Secondary | ICD-10-CM

## 2020-04-05 LAB — BASIC METABOLIC PANEL
Anion gap: 7 (ref 5–15)
BUN: 13 mg/dL (ref 8–23)
CO2: 36 mmol/L — ABNORMAL HIGH (ref 22–32)
Calcium: 8.4 mg/dL — ABNORMAL LOW (ref 8.9–10.3)
Chloride: 98 mmol/L (ref 98–111)
Creatinine, Ser: 0.82 mg/dL (ref 0.44–1.00)
GFR calc Af Amer: >60 mL/min (ref >60)
GFR calc non Af Amer: >60 mL/min (ref >60)
Glucose, Bld: 106 mg/dL — ABNORMAL HIGH (ref 70–99)
Potassium: 4.2 mmol/L (ref 3.5–5.1)
Sodium: 141 mmol/L (ref 135–145)

## 2020-04-05 LAB — LIPID PANEL
Cholesterol: 187 mg/dL (ref 0–200)
HDL: 30 mg/dL — ABNORMAL LOW (ref >40)
LDL Cholesterol: 131 mg/dL — ABNORMAL HIGH (ref 0–99)
Total CHOL/HDL Ratio: 6.2 RATIO
Triglycerides: 128 mg/dL (ref <150)
VLDL: 26 mg/dL (ref 0–40)

## 2020-04-05 MED ORDER — TECHNETIUM TC 99M TETROFOSMIN IV KIT
28.2000 | PACK | Freq: Once | INTRAVENOUS | Status: AC | PRN
  Administered 2020-04-05: 28.2 via INTRAVENOUS

## 2020-04-05 MED ORDER — REGADENOSON 0.4 MG/5ML IV SOLN
INTRAVENOUS | Status: AC
  Filled 2020-04-05: qty 5

## 2020-04-05 MED ORDER — REGADENOSON 0.4 MG/5ML IV SOLN
0.4000 mg | Freq: Once | INTRAVENOUS | Status: AC
  Administered 2020-04-05: 0.4 mg via INTRAVENOUS
  Filled 2020-04-05: qty 5

## 2020-04-05 NOTE — Progress Notes (Signed)
PA Barrett present for lexiscan stress test

## 2020-04-05 NOTE — TOC Progression Note (Signed)
Transition of Care [facility]) - Progression Note    Patient Details  Name: [name] MRN: [id number] Date of Birth: Oct 09, 1955  [facility]) CM/SW Contact  Leone Haven, RN Phone Number: 04/05/2020, 9:02 AM  Clinical Narrative:    NCM spoke with patient over the phone, she states she has PCP Dr. Tiburcio Pea and would like to continue to keep this MD as PCP.  She states she gets her meds from [facility] and she has no issues paying for them.  She has no insurance.  She states she does not need any HH services.         Expected Discharge Plan and Services                                                 Social Determinants of Health (SDOH) Interventions    Readmission Risk Interventions No flowsheet data found.

## 2020-04-05 NOTE — Progress Notes (Signed)
   [name] presented for a nuclear stress test today.  No immediate complications.  Stress imaging is pending at this time.  Preliminary EKG findings may be listed in the chart, but the stress test result will not be finalized until perfusion imaging is complete.  2 day study, [place] to read.  Theodore Demark, PA-C 04/05/2020, 10:16 AM

## 2020-04-05 NOTE — Progress Notes (Addendum)
[facility] PROGRESS NOTE    Progress Note  [name]  TIW:[id number] DOB: March 05, 1956 DOA: 04/03/2020 PCP: Johny Blamer, MD     Brief Narrative:   [name] is an 64 y.o. 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  Check her oxygenation was 55% and her lips were blue, in the ED she was found to be hypoxic with a blood pressure of 194/112.  ABG revealed 7.30 4/57/142.  High sensitive troponin at 300 CT angio of the chest showed no PE but mild edema.  Assessment/Plan:   Acute respiratory failure with hypoxia and hypercapnia possibly due to acute diastolic heart failure: With a BNP of 204, CT angio of the chest showed no PE but today showed mild pulmonary edema. Started on IV Lasix, liters.  Done that showed an EF of 55% with grade 1 diastolic heart failure daily showed wall motion abnormality, of the left mid apical anteroseptal wall. Continue to wean off oxygen saturations greater than 92%.  Elevated troponins: On presentation it was 400 and has slowly trended down, twelve-lead EKG showed nonspecific changes. Cardiology was consulted recommended a stress test that will be done on 04/05/2020.  Hypertensive urgency: With a blood pressure of 194 112 on admission. He was restarted on losartan at home dose.  Metoprolol was held for stress test. Question if her elevation was likely due to her hypoxia.  We started her to wean off the oxygen.    History of DVT and PE: She is no longer on anticoagulation she will need to follow-up with pulmonary as an outpatient.  Seasonal depression: Noted.  Obesity, Class III, BMI 40-49.9 (morbid obesity) (HCC) Noted   DVT prophylaxis: lovenxo Family Communication:none Status is: Inpatient  Remains inpatient appropriate because:Hemodynamically  unstable   Dispo: The patient is from: Home              Anticipated d/c is to: Home              Anticipated d/c date is: 1 day              Patient currently is not medically stable to d/c.        Code Status:     Code Status Orders  (From admission, onward)         Start     Ordered   04/04/20 1310  Full code  Continuous        04/04/20 1311        Code Status History    Date Active Date Inactive Code Status Order ID Comments User Context   08/23/2012 0255 09/24/2012 1724 Full Code [id number]  Codjie, Delphine Inpatient   08/05/2012 2222 08/11/2012 2005 Full Code [id number]  Kalman Shan, MD ED   Advance Care Planning Activity        IV Access:    Peripheral IV   Procedures and diagnostic studies:   CT Angio Chest PE W and/or Wo Contrast  Result Date: 04/03/2020 CLINICAL DATA:  Shortness of breath fever EXAM: CT ANGIOGRAPHY CHEST WITH CONTRAST TECHNIQUE: Multidetector CT imaging of the chest was performed using the standard protocol during bolus administration of intravenous contrast. Multiplanar CT image reconstructions and MIPs were obtained to evaluate the vascular anatomy. CONTRAST:  OMNIPAQUE IOHEXOL 350 MG/ML SOLN COMPARISON:  None. FINDINGS: Cardiovascular: There is a  optimal opacification of the pulmonary arteries. There is no central,segmental, or subsegmental filling defects within the pulmonary arteries. There is mild cardiomegaly. No pericardial effusion or thickening. No evidence right heart strain. There is normal three-vessel brachiocephalic anatomy without proximal stenosis. The thoracic aorta is normal in appearance. Mediastinum/Nodes: No hilar, mediastinal, or axillary adenopathy. Thyroid gland, trachea, and esophagus demonstrate no significant findings. Lungs/Pleura: There is mild peripheral upper lobe ground-glass opacity in interlobular septal thickening. No pleural effusion or pneumothorax. No airspace consolidation. Upper Abdomen: No acute  abnormalities present in the visualized portions of the upper abdomen. Musculoskeletal: No chest wall abnormality. No acute or significant osseous findings. Review of the MIP images confirms the above findings. IMPRESSION: No central, segmental, or subsegmental pulmonary embolism. Mild cardiomegaly Minimal bilateral ground-glass opacities in the upper lobe which may be due to mild interstitial edema or atelectasis. Electronically Signed   By: Jonna Clark M.D.   On: 04/03/2020 19:20   DG Chest Portable 1 View  Result Date: 04/03/2020 CLINICAL DATA:  Shortness of breath.  Headache and nausea. EXAM: PORTABLE CHEST 1 VIEW COMPARISON:  Most recent radiograph 07/15/2013 FINDINGS: Chronic cardiomegaly, unchanged. Areas of bilateral lung scarring that appears similar to prior exam. No evidence of acute airspace disease. Mild peribronchial thickening that may have progressed. No pleural effusion. No pneumothorax. No acute osseous abnormalities are seen. IMPRESSION: 1. Chronic cardiomegaly. Mild peribronchial thickening that may have progressed from prior exam, may represent acute bronchitis. 2. Stable bilateral lung scarring. Electronically Signed   By: Narda Rutherford M.D.   On: 04/03/2020 18:05   ECHOCARDIOGRAM COMPLETE  Result Date: 04/04/2020    ECHOCARDIOGRAM REPORT   Patient Name:   [name]  Date of Exam: 04/04/2020 Medical Rec #:  [id number]  Height:       67.0 in Accession #:    [id number] Weight:       266.4 lb Date of Birth:  28-Jul-1956 BSA:          2.284 m Patient Age:    63 years   BP:           173/106 mmHg Patient Gender: F          HR:           62 bpm. Exam Location:  Inpatient Procedure: 2D Echo, Color Doppler, Cardiac Doppler and Intracardiac            Opacification Agent Indications:    Elevated Troponin  History:        Patient has no prior history of Echocardiogram examinations.                 Risk Factors:Hypertension.  Sonographer:    Irving Burton Senior RDCS Referring Phys: [phone] RONDELL A  SMITH  Sonographer Comments: Technically difficult due to patient body habitus. IMPRESSIONS  1. Very technically difficult study, even with Definity contrast - there may be an anteroapical/distal anteroseptal WMA. LVEF appears relatively preserved.. Left ventricular ejection fraction, by estimation, is 50 to 55%. The left ventricle has low normal function. The left ventricle demonstrates regional wall motion abnormalities (see scoring diagram/findings for description). There is mild left ventricular hypertrophy. Left ventricular diastolic parameters are consistent with Grade I diastolic dysfunction (impaired relaxation). There is moderate hypokinesis of the left ventricular, mid-apical anteroseptal wall and apical segment.  2. Right ventricular systolic function is normal. The right ventricular size is normal.  3. Left atrial size was mildly dilated.  4. The mitral valve was not well visualized. No  evidence of mitral valve regurgitation.  5. The aortic valve is tricuspid. Aortic valve regurgitation is not visualized.  6. The inferior vena cava is normal in size with greater than 50% respiratory variability, suggesting right atrial pressure of 3 mmHg. FINDINGS  Left Ventricle: Very technically difficult study, even with Definity contrast - there may be an anteroapical/distal anteroseptal WMA. LVEF appears relatively preserved. Left ventricular ejection fraction, by estimation, is 50 to 55%. The left ventricle has low normal function. The left ventricle demonstrates regional wall motion abnormalities. Moderate hypokinesis of the left ventricular, mid-apical anteroseptal wall and apical segment. Definity contrast agent was given IV to delineate the left ventricular endocardial borders. The left ventricular internal cavity size was normal in size. There is mild left ventricular hypertrophy. Left ventricular diastolic parameters are consistent with Grade I diastolic dysfunction (impaired relaxation). Indeterminate  filling pressures. Right Ventricle: The right ventricular size is normal. No increase in right ventricular wall thickness. Right ventricular systolic function is normal. Left Atrium: Left atrial size was mildly dilated. Right Atrium: Right atrial size was normal in size. Pericardium: There is no evidence of pericardial effusion. Mitral Valve: The mitral valve was not well visualized. No evidence of mitral valve regurgitation. Tricuspid Valve: The tricuspid valve is not well visualized. Tricuspid valve regurgitation is not demonstrated. Aortic Valve: The aortic valve is tricuspid. Aortic valve regurgitation is not visualized. Pulmonic Valve: The pulmonic valve was normal in structure. Pulmonic valve regurgitation is not visualized. Aorta: The aortic root and ascending aorta are structurally normal, with no evidence of dilitation. Venous: The inferior vena cava is normal in size with greater than 50% respiratory variability, suggesting right atrial pressure of 3 mmHg. IAS/Shunts: No atrial level shunt detected by color flow Doppler.  LEFT VENTRICLE PLAX 2D LVIDd:         4.90 cm  Diastology LVIDs:         2.90 cm  LV e' lateral:   4.68 cm/s LV PW:         1.10 cm  LV E/e' lateral: 12.3 LV IVS:        1.10 cm  LV e' medial:    4.79 cm/s LVOT diam:     2.00 cm  LV E/e' medial:  12.0 LV SV:         66 LV SV Index:   29 LVOT Area:     3.14 cm  RIGHT VENTRICLE RV S prime:     15.90 cm/s TAPSE (M-mode): 3.0 cm LEFT ATRIUM             Index       RIGHT ATRIUM           Index LA diam:        3.80 cm 1.66 cm/m  RA Area:     20.00 cm LA Vol (A2C):   89.3 ml 39.09 ml/m RA Volume:   56.70 ml  24.82 ml/m LA Vol (A4C):   77.5 ml 33.93 ml/m LA Biplane Vol: 84.9 ml 37.17 ml/m  AORTIC VALVE LVOT Vmax:   98.30 cm/s LVOT Vmean:  66.400 cm/s LVOT VTI:    0.209 m  AORTA Ao Root diam: 2.90 cm Ao Asc diam:  3.50 cm MITRAL VALVE MV Area (PHT): 2.32 cm    SHUNTS MV Decel Time: 327 msec    Systemic VTI:  0.21 m MV E velocity: 57.40  cm/s  Systemic Diam: 2.00 cm MV A velocity: 80.60 cm/s MV E/A ratio:  0.71 Zoila Shutter MD  Electronically signed by Zoila Shutter MD Signature Date/Time: 04/04/2020/2:37:33 PM    Final      Medical Consultants:    None.  Anti-Infectives:   none  Subjective:    [name] relates her breathing is unchanged.  Objective:    Vitals:   04/04/20 1515 04/04/20 2004 04/05/20 0507 04/05/20 0513  BP: (!) 143/91 (!) 151/98 (!) 159/90   Pulse: 62 60 (!) 55   Resp:  19 16   Temp:  98.6 F (37 C) 98 F (36.7 C)   TempSrc:  Oral Oral   SpO2:  93%    Weight:    129 kg  Height:       SpO2: 93 % O2 Flow Rate (L/min): 1 L/min   Intake/Output Summary (Last 24 hours) at 04/05/2020 0736 Last data filed at 04/05/2020 0149 Gross per 24 hour  Intake 240 ml  Output 1850 ml  Net -1610 ml   Filed Weights   04/04/20 1303 04/05/20 0513  Weight: 130.3 kg 129 kg    Exam: General exam: In no acute distress. Respiratory system: Good air movement and clear to auscultation. Cardiovascular system: S1 & S2 heard, RRR. No JVD. Gastrointestinal system: Abdomen is nondistended, soft and nontender.  Extremities: No pedal edema. Skin: No rashes, lesions or ulcers  Data Reviewed:    Labs: Basic Metabolic Panel: Recent Labs  Lab 04/03/20 1723 04/03/20 1816  NA 137 140  K 4.7 4.6  CL 100  --   CO2 30  --   GLUCOSE 145*  --   BUN 16  --   CREATININE 0.82  --   CALCIUM 8.5*  --    GFR Estimated Creatinine Clearance: 99.5 mL/min (by C-G formula based on SCr of 0.82 mg/dL). Liver Function Tests: Recent Labs  Lab 04/03/20 1723  AST 94*  ALT 101*  ALKPHOS 82  BILITOT 0.4  PROT 6.6  ALBUMIN 3.6   No results for input(s): LIPASE, AMYLASE in the last 168 hours. No results for input(s): AMMONIA in the last 168 hours. Coagulation profile No results for input(s): INR, PROTIME in the last 168 hours. COVID-19 Labs  Recent Labs    04/03/20 1723  DDIMER 0.52*    Lab Results   Component Value Date   SARSCOV2NAA NEGATIVE 04/03/2020    CBC: Recent Labs  Lab 04/03/20 1723 04/03/20 1816  WBC 7.7  --   NEUTROABS 6.2  --   HGB 15.0 15.6*  HCT 48.1* 46.0  MCV 93.0  --   PLT 231  --    Cardiac Enzymes: No results for input(s): CKTOTAL, CKMB, CKMBINDEX, TROPONINI in the last 168 hours. BNP (last 3 results) No results for input(s): PROBNP in the last 8760 hours. CBG: No results for input(s): GLUCAP in the last 168 hours. D-Dimer: Recent Labs    04/03/20 1723  DDIMER 0.52*   Hgb A1c: No results for input(s): HGBA1C in the last 72 hours. Lipid Profile: No results for input(s): CHOL, HDL, LDLCALC, TRIG, CHOLHDL, LDLDIRECT in the last 72 hours. Thyroid function studies: Recent Labs    04/04/20 1345  TSH 4.397   Anemia work up: No results for input(s): VITAMINB12, FOLATE, FERRITIN, TIBC, IRON, RETICCTPCT in the last 72 hours. Sepsis Labs: Recent Labs  Lab 04/03/20 1723 04/03/20 1920  WBC 7.7  --   LATICACIDVEN 1.8 1.2   Microbiology Recent Results (from the past 240 hour(s))  SARS Coronavirus 2 by RT PCR (hospital order, performed in [facility] hospital lab) Nasopharyngeal  Nasopharyngeal Swab     Status: None   Collection Time: 04/03/20  5:19 PM   Specimen: Nasopharyngeal Swab  Result Value Ref Range Status   SARS Coronavirus 2 NEGATIVE NEGATIVE Final    Comment: (NOTE) SARS-CoV-2 target nucleic acids are NOT DETECTED.  The SARS-CoV-2 RNA is generally detectable in upper and lower respiratory specimens during the acute phase of infection. The lowest concentration of SARS-CoV-2 viral copies this assay can detect is 250 copies / mL. A negative result does not preclude SARS-CoV-2 infection and should not be used as the sole basis for treatment or other patient management decisions.  A negative result may occur with improper specimen collection / handling, submission of specimen other than nasopharyngeal swab, presence of viral mutation(s)  within the areas targeted by this assay, and inadequate number of viral copies (<250 copies / mL). A negative result must be combined with clinical observations, patient history, and epidemiological information.  Fact Sheet for Patients:   BoilerBrush.com.cyhttps://www.fda.gov/media/136312/download  Fact Sheet for Healthcare Providers: https://pope.com/https://www.fda.gov/media/136313/download  This test is not yet approved or  cleared by the [place] FDA and has been authorized for detection and/or diagnosis of SARS-CoV-2 by FDA under an Emergency Use Authorization (EUA).  This EUA will remain in effect (meaning this test can be used) for the duration of the COVID-19 declaration under Section 564(b)(1) of the Act, 21 U.S.C. section 360bbb-3(b)(1), unless the authorization is terminated or revoked sooner.  Performed at [facility] [place], [address] [place], [place] [id number]      Medications:   . enoxaparin (LOVENOX) injection  40 mg Subcutaneous Q24H  . losartan  25 mg Oral QHS  . sertraline  100 mg Oral QHS  . sodium chloride flush  3 mL Intravenous Q12H   Continuous Infusions: . sodium chloride        LOS: 1 day   Marinda ElkAbraham Feliz Ortiz  [facility]  04/05/2020, 7:36 AM

## 2020-04-05 NOTE — Progress Notes (Signed)
Received losartan 25mg  po in tube station from 3E as requested via phone by Pa Barrett. Per MAR, losartan 25mg  po ordered at bedtime 2200 daily. This medication was not given by this RN at this time. I called [facility] to inform pt RN, she is off the floor at this time. Secretary took message and states she will tell pt floor RN.  Pt denies any pain n/v at this time, she is awake and alert in wheelchair in [facility] lab.

## 2020-04-05 NOTE — Progress Notes (Addendum)
Progress Note  Patient Name: [name] Date of Encounter: 04/05/2020  Primary Cardiologist:   No primary care provider on file.   Subjective   No chest pain or SOB overnight.    Inpatient Medications    Scheduled Meds: . enoxaparin (LOVENOX) injection  40 mg Subcutaneous Q24H  . losartan  25 mg Oral QHS  . sertraline  100 mg Oral QHS  . sodium chloride flush  3 mL Intravenous Q12H   Continuous Infusions: . sodium chloride     PRN Meds: sodium chloride, acetaminophen, hydrALAZINE, ondansetron (ZOFRAN) IV, sodium chloride flush   Vital Signs    Vitals:   04/04/20 1515 04/04/20 2004 04/05/20 0507 04/05/20 0513  BP: (!) 143/91 (!) 151/98 (!) 159/90   Pulse: 62 60 (!) 55   Resp:  19 16   Temp:  98.6 F (37 C) 98 F (36.7 C)   TempSrc:  Oral Oral   SpO2:  93%    Weight:    129 kg  Height:        Intake/Output Summary (Last 24 hours) at 04/05/2020 0800 Last data filed at 04/05/2020 0149 Gross per 24 hour  Intake 240 ml  Output 1850 ml  Net -1610 ml   Filed Weights   04/04/20 1303 04/05/20 0513  Weight: 130.3 kg 129 kg    Telemetry    NSR, SB - Personally Reviewed  ECG    NA - Personally Reviewed  Physical Exam   GEN: No acute distress.    Labs    Chemistry Recent Labs  Lab 04/03/20 1723 04/03/20 1816  NA 137 140  K 4.7 4.6  CL 100  --   CO2 30  --   GLUCOSE 145*  --   BUN 16  --   CREATININE 0.82  --   CALCIUM 8.5*  --   PROT 6.6  --   ALBUMIN 3.6  --   AST 94*  --   ALT 101*  --   ALKPHOS 82  --   BILITOT 0.4  --   GFRNONAA >60  --   GFRAA >60  --   ANIONGAP 7  --      Hematology Recent Labs  Lab 04/03/20 1723 04/03/20 1816  WBC 7.7  --   RBC 5.17*  --   HGB 15.0 15.6*  HCT 48.1* 46.0  MCV 93.0  --   MCH 29.0  --   MCHC 31.2  --   RDW 13.3  --   PLT 231  --     Cardiac EnzymesNo results for input(s): TROPONINI in the last 168 hours. No results for input(s): TROPIPOC in the last 168 hours.   BNP Recent Labs  Lab  04/04/20 1345  BNP 204.3*     DDimer  Recent Labs  Lab 04/03/20 1723  DDIMER 0.52*     Radiology    CT Angio Chest PE W and/or Wo Contrast  Result Date: 04/03/2020 CLINICAL DATA:  Shortness of breath fever EXAM: CT ANGIOGRAPHY CHEST WITH CONTRAST TECHNIQUE: Multidetector CT imaging of the chest was performed using the standard protocol during bolus administration of intravenous contrast. Multiplanar CT image reconstructions and MIPs were obtained to evaluate the vascular anatomy. CONTRAST:  OMNIPAQUE IOHEXOL 350 MG/ML SOLN COMPARISON:  None. FINDINGS: Cardiovascular: There is a optimal opacification of the pulmonary arteries. There is no central,segmental, or subsegmental filling defects within the pulmonary arteries. There is mild cardiomegaly. No pericardial effusion or thickening. No evidence right heart strain.  There is normal three-vessel brachiocephalic anatomy without proximal stenosis. The thoracic aorta is normal in appearance. Mediastinum/Nodes: No hilar, mediastinal, or axillary adenopathy. Thyroid gland, trachea, and esophagus demonstrate no significant findings. Lungs/Pleura: There is mild peripheral upper lobe ground-glass opacity in interlobular septal thickening. No pleural effusion or pneumothorax. No airspace consolidation. Upper Abdomen: No acute abnormalities present in the visualized portions of the upper abdomen. Musculoskeletal: No chest wall abnormality. No acute or significant osseous findings. Review of the MIP images confirms the above findings. IMPRESSION: No central, segmental, or subsegmental pulmonary embolism. Mild cardiomegaly Minimal bilateral ground-glass opacities in the upper lobe which may be due to mild interstitial edema or atelectasis. Electronically Signed   By: Jonna Clark M.D.   On: 04/03/2020 19:20   DG Chest Portable 1 View  Result Date: 04/03/2020 CLINICAL DATA:  Shortness of breath.  Headache and nausea. EXAM: PORTABLE CHEST 1 VIEW  COMPARISON:  Most recent radiograph 07/15/2013 FINDINGS: Chronic cardiomegaly, unchanged. Areas of bilateral lung scarring that appears similar to prior exam. No evidence of acute airspace disease. Mild peribronchial thickening that may have progressed. No pleural effusion. No pneumothorax. No acute osseous abnormalities are seen. IMPRESSION: 1. Chronic cardiomegaly. Mild peribronchial thickening that may have progressed from prior exam, may represent acute bronchitis. 2. Stable bilateral lung scarring. Electronically Signed   By: Narda Rutherford M.D.   On: 04/03/2020 18:05   ECHOCARDIOGRAM COMPLETE  Result Date: 04/04/2020    ECHOCARDIOGRAM REPORT   Patient Name:   [name]  Date of Exam: 04/04/2020 Medical Rec #:  [id number]  Height:       67.0 in Accession #:    [id number] Weight:       266.4 lb Date of Birth:  06/21/1956 BSA:          2.284 m Patient Age:    63 years   BP:           173/106 mmHg Patient Gender: F          HR:           62 bpm. Exam Location:  Inpatient Procedure: 2D Echo, Color Doppler, Cardiac Doppler and Intracardiac            Opacification Agent Indications:    Elevated Troponin  History:        Patient has no prior history of Echocardiogram examinations.                 Risk Factors:Hypertension.  Sonographer:    Irving Burton Senior RDCS Referring Phys: [id number] RONDELL A SMITH  Sonographer Comments: Technically difficult due to patient body habitus. IMPRESSIONS  1. Very technically difficult study, even with Definity contrast - there may be an anteroapical/distal anteroseptal WMA. LVEF appears relatively preserved.. Left ventricular ejection fraction, by estimation, is 50 to 55%. The left ventricle has low normal function. The left ventricle demonstrates regional wall motion abnormalities (see scoring diagram/findings for description). There is mild left ventricular hypertrophy. Left ventricular diastolic parameters are consistent with Grade I diastolic dysfunction (impaired relaxation).  There is moderate hypokinesis of the left ventricular, mid-apical anteroseptal wall and apical segment.  2. Right ventricular systolic function is normal. The right ventricular size is normal.  3. Left atrial size was mildly dilated.  4. The mitral valve was not well visualized. No evidence of mitral valve regurgitation.  5. The aortic valve is tricuspid. Aortic valve regurgitation is not visualized.  6. The inferior vena cava is normal in size with greater than 50%  respiratory variability, suggesting right atrial pressure of 3 mmHg. FINDINGS  Left Ventricle: Very technically difficult study, even with Definity contrast - there may be an anteroapical/distal anteroseptal WMA. LVEF appears relatively preserved. Left ventricular ejection fraction, by estimation, is 50 to 55%. The left ventricle has low normal function. The left ventricle demonstrates regional wall motion abnormalities. Moderate hypokinesis of the left ventricular, mid-apical anteroseptal wall and apical segment. Definity contrast agent was given IV to delineate the left ventricular endocardial borders. The left ventricular internal cavity size was normal in size. There is mild left ventricular hypertrophy. Left ventricular diastolic parameters are consistent with Grade I diastolic dysfunction (impaired relaxation). Indeterminate filling pressures. Right Ventricle: The right ventricular size is normal. No increase in right ventricular wall thickness. Right ventricular systolic function is normal. Left Atrium: Left atrial size was mildly dilated. Right Atrium: Right atrial size was normal in size. Pericardium: There is no evidence of pericardial effusion. Mitral Valve: The mitral valve was not well visualized. No evidence of mitral valve regurgitation. Tricuspid Valve: The tricuspid valve is not well visualized. Tricuspid valve regurgitation is not demonstrated. Aortic Valve: The aortic valve is tricuspid. Aortic valve regurgitation is not visualized.  Pulmonic Valve: The pulmonic valve was normal in structure. Pulmonic valve regurgitation is not visualized. Aorta: The aortic root and ascending aorta are structurally normal, with no evidence of dilitation. Venous: The inferior vena cava is normal in size with greater than 50% respiratory variability, suggesting right atrial pressure of 3 mmHg. IAS/Shunts: No atrial level shunt detected by color flow Doppler.  LEFT VENTRICLE PLAX 2D LVIDd:         4.90 cm  Diastology LVIDs:         2.90 cm  LV e' lateral:   4.68 cm/s LV PW:         1.10 cm  LV E/e' lateral: 12.3 LV IVS:        1.10 cm  LV e' medial:    4.79 cm/s LVOT diam:     2.00 cm  LV E/e' medial:  12.0 LV SV:         66 LV SV Index:   29 LVOT Area:     3.14 cm  RIGHT VENTRICLE RV S prime:     15.90 cm/s TAPSE (M-mode): 3.0 cm LEFT ATRIUM             Index       RIGHT ATRIUM           Index LA diam:        3.80 cm 1.66 cm/m  RA Area:     20.00 cm LA Vol (A2C):   89.3 ml 39.09 ml/m RA Volume:   56.70 ml  24.82 ml/m LA Vol (A4C):   77.5 ml 33.93 ml/m LA Biplane Vol: 84.9 ml 37.17 ml/m  AORTIC VALVE LVOT Vmax:   98.30 cm/s LVOT Vmean:  66.400 cm/s LVOT VTI:    0.209 m  AORTA Ao Root diam: 2.90 cm Ao Asc diam:  3.50 cm MITRAL VALVE MV Area (PHT): 2.32 cm    SHUNTS MV Decel Time: 327 msec    Systemic VTI:  0.21 m MV E velocity: 57.40 cm/s  Systemic Diam: 2.00 cm MV A velocity: 80.60 cm/s MV E/A ratio:  0.71 Kenneth HiltyZoila Shutter MD Electronically signed by Zoila ShutterKenneth Hilty MD Signature Date/Time: 04/04/2020/2:37:33 PM    Final     Cardiac Studies   Echo  1. Very technically difficult study, even with Definity contrast -  there  may be an anteroapical/distal anteroseptal WMA. LVEF appears relatively  preserved.. Left ventricular ejection fraction, by estimation, is 50 to  55%. The left ventricle has low  normal function. The left ventricle demonstrates regional wall motion  abnormalities (see scoring diagram/findings for description). There is  mild left  ventricular hypertrophy. Left ventricular diastolic parameters  are consistent with Grade I diastolic  dysfunction (impaired relaxation). There is moderate hypokinesis of the  left ventricular, mid-apical anteroseptal wall and apical segment.  2. Right ventricular systolic function is normal. The right ventricular  size is normal.  3. Left atrial size was mildly dilated.  4. The mitral valve was not well visualized. No evidence of mitral valve  regurgitation.  5. The aortic valve is tricuspid. Aortic valve regurgitation is not  visualized.  6. The inferior vena cava is normal in size with greater than 50%  respiratory variability, suggesting right atrial pressure of 3 mmHg.   Patient Profile     64 y.o. 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 Katrinka Blazing.  Assessment & Plan    ELEVATED TROPONIN:  Two day Lexiscan Myoview ordered.  Part one today.    DECREASED LOC:  This was the presenting complaint.  Question sleep apnea.  Plan is for out patient sleep study.    ELEVATED BNP:  EF is difficult to assess on echo.  She does not seem like she is overtly volume overloaded and she is oxygenating well.  Net negative I/O since admission.  Lasix discontinued.     For questions or updates, please contact [facility] Please consult [email or web address] for contact info under Cardiology/STEMI.   Signed, Rollene Rotunda, MD  04/05/2020, 8:00 AM

## 2020-04-06 ENCOUNTER — Encounter (HOSPITAL_COMMUNITY): Payer: Self-pay | Admitting: Family Medicine

## 2020-04-06 DIAGNOSIS — J9611 Chronic respiratory failure with hypoxia: Secondary | ICD-10-CM

## 2020-04-06 DIAGNOSIS — I10 Essential (primary) hypertension: Secondary | ICD-10-CM

## 2020-04-06 LAB — NM MYOCAR MULTI W/SPECT W/WALL MOTION / EF
Estimated workload: 1 METS
Exercise duration (min): 7 min
Exercise duration (sec): 20 s
MPHR: 158 {beats}/min
Peak HR: 82 {beats}/min
Percent HR: 51 %
Rest HR: 54 {beats}/min

## 2020-04-06 LAB — BASIC METABOLIC PANEL
Anion gap: 9 (ref 5–15)
BUN: 9 mg/dL (ref 8–23)
CO2: 33 mmol/L — ABNORMAL HIGH (ref 22–32)
Calcium: 8.7 mg/dL — ABNORMAL LOW (ref 8.9–10.3)
Chloride: 97 mmol/L — ABNORMAL LOW (ref 98–111)
Creatinine, Ser: 0.78 mg/dL (ref 0.44–1.00)
GFR calc Af Amer: >60 mL/min (ref >60)
GFR calc non Af Amer: >60 mL/min (ref >60)
Glucose, Bld: 119 mg/dL — ABNORMAL HIGH (ref 70–99)
Potassium: 4.2 mmol/L (ref 3.5–5.1)
Sodium: 139 mmol/L (ref 135–145)

## 2020-04-06 MED ORDER — TECHNETIUM TC 99M TETROFOSMIN IV KIT
32.3000 | PACK | Freq: Once | INTRAVENOUS | Status: AC | PRN
  Administered 2020-04-06: 32.3 via INTRAVENOUS

## 2020-04-06 NOTE — Plan of Care (Signed)
Problem: Education: Goal: Ability to demonstrate management of disease process will improve 04/06/2020 1907 by Saunders Revel, RN Outcome: Adequate for Discharge 04/06/2020 1137 by Saunders Revel, RN Outcome: Progressing Goal: Ability to verbalize understanding of medication therapies will improve 04/06/2020 1907 by Saunders Revel, RN Outcome: Adequate for Discharge 04/06/2020 1137 by Saunders Revel, RN Outcome: Progressing Goal: Individualized Educational Video(s) 04/06/2020 1907 by Saunders Revel, RN Outcome: Adequate for Discharge 04/06/2020 1137 by Saunders Revel, RN Outcome: Progressing   Problem: Activity: Goal: Capacity to carry out activities will improve 04/06/2020 1907 by Saunders Revel, RN Outcome: Adequate for Discharge 04/06/2020 1137 by Saunders Revel, RN Outcome: Progressing   Problem: Cardiac: Goal: Ability to achieve and maintain adequate cardiopulmonary perfusion will improve 04/06/2020 1907 by Saunders Revel, RN Outcome: Adequate for Discharge 04/06/2020 1137 by Saunders Revel, RN Outcome: Progressing   Problem: Education: Goal: Knowledge of General Education information will improve Description: Including pain rating scale, medication(s)/side effects and non-pharmacologic comfort measures 04/06/2020 1907 by Saunders Revel, RN Outcome: Adequate for Discharge 04/06/2020 1137 by Saunders Revel, RN Outcome: Progressing   Problem: Health Behavior/Discharge Planning: Goal: Ability to manage health-related needs will improve 04/06/2020 1907 by Saunders Revel, RN Outcome: Adequate for Discharge 04/06/2020 1137 by Saunders Revel, RN Outcome: Progressing   Problem: Clinical Measurements: Goal: Ability to maintain clinical measurements within normal limits will improve 04/06/2020 1907 by Saunders Revel, RN Outcome: Adequate for Discharge 04/06/2020 1137 by Saunders Revel, RN Outcome: Progressing Goal: Will remain free from  infection 04/06/2020 1907 by Saunders Revel, RN Outcome: Adequate for Discharge 04/06/2020 1137 by Saunders Revel, RN Outcome: Progressing Goal: Diagnostic test results will improve 04/06/2020 1907 by Saunders Revel, RN Outcome: Adequate for Discharge 04/06/2020 1137 by Saunders Revel, RN Outcome: Progressing Goal: Respiratory complications will improve 04/06/2020 1907 by Saunders Revel, RN Outcome: Adequate for Discharge 04/06/2020 1137 by Saunders Revel, RN Outcome: Progressing Goal: Cardiovascular complication will be avoided 04/06/2020 1907 by Saunders Revel, RN Outcome: Adequate for Discharge 04/06/2020 1137 by Saunders Revel, RN Outcome: Progressing   Problem: Activity: Goal: Risk for activity intolerance will decrease 04/06/2020 1907 by Saunders Revel, RN Outcome: Adequate for Discharge 04/06/2020 1137 by Saunders Revel, RN Outcome: Progressing   Problem: Nutrition: Goal: Adequate nutrition will be maintained 04/06/2020 1907 by Saunders Revel, RN Outcome: Adequate for Discharge 04/06/2020 1137 by Saunders Revel, RN Outcome: Progressing   Problem: Coping: Goal: Level of anxiety will decrease 04/06/2020 1907 by Saunders Revel, RN Outcome: Adequate for Discharge 04/06/2020 1137 by Saunders Revel, RN Outcome: Progressing   Problem: Elimination: Goal: Will not experience complications related to bowel motility 04/06/2020 1907 by Saunders Revel, RN Outcome: Adequate for Discharge 04/06/2020 1137 by Saunders Revel, RN Outcome: Progressing Goal: Will not experience complications related to urinary retention 04/06/2020 1907 by Saunders Revel, RN Outcome: Adequate for Discharge 04/06/2020 1137 by Saunders Revel, RN Outcome: Progressing   Problem: Pain Managment: Goal: General experience of comfort will improve 04/06/2020 1907 by Saunders Revel, RN Outcome: Adequate for Discharge 04/06/2020 1137 by Saunders Revel,  RN Outcome: Progressing   Problem: Safety: Goal: Ability to remain free from injury will improve 04/06/2020 1907 by Saunders Revel, RN Outcome: Adequate for Discharge 04/06/2020 1137 by Saunders Revel, RN Outcome: Progressing   Problem: Skin Integrity: Goal: Risk for impaired skin integrity  will decrease 04/06/2020 1907 by Saunders Revel, RN Outcome: Adequate for Discharge 04/06/2020 1137 by Saunders Revel, RN Outcome: Progressing

## 2020-04-06 NOTE — Progress Notes (Signed)
Pt desats without O2. Drops to 88% SPO2. [place] place  Back. Pt denies SOB at that moment. Will monitor.

## 2020-04-06 NOTE — TOC Progression Note (Signed)
Transition of Care [facility]) - Progression Note    Patient Details  Name: [name] MRN: [id number] Date of Birth: 08-19-1955  [facility] CM/SW Contact  Leone Haven, RN Phone Number: 04/06/2020, 8:59 AM  Clinical Narrative:    Patient will need home oxygen for charity.          Expected Discharge Plan and Services                                                 Social Determinants of Health (SDOH) Interventions    Readmission Risk Interventions No flowsheet data found.

## 2020-04-06 NOTE — Progress Notes (Signed)
CCMD called, Pt has PVC's. - upon assessment pt went to the bathroom and no complains. Tele lids fxed.

## 2020-04-06 NOTE — Progress Notes (Signed)
Patient desaturated down to 80% after ambulating in the hallway with no oxygen on. She went down immediately to 88% with removal of oxygen. It took one liter of oxygen to correct saturation at rest. It took 2 liters with ambulation with exertion.

## 2020-04-06 NOTE — Plan of Care (Signed)
?  Problem: Activity: ?Goal: Capacity to carry out activities will improve ?Outcome: Progressing ?  ?

## 2020-04-06 NOTE — Plan of Care (Signed)

## 2020-04-06 NOTE — Discharge Summary (Signed)
Physician Discharge Summary  [name] ZOX:[id number] DOB: 1955/10/07 DOA: 04/03/2020  PCP: Johny Blamer, MD  Admit date: 04/03/2020 Discharge date: 04/06/2020  Admitted From: home Disposition:  home   Recommendations for Outpatient Follow-up:  1. Encourage weight loss  Home Health:  none  Discharge Condition:  stable   CODE STATUS:  Full code   Diet recommendation:  Heart healthy Consultations:  Cardiology  Pulmonary   Procedures/Studies: Marland Kitchen Myoview stress test . 2 D ECHO   Discharge Diagnoses:  Principal Problem:   Acute respiratory failure with hypoxia and hypercapnia (HCC) Active Problems:   Benign essential HTN   Chronic respiratory failure (HCC)   Obesity, Class III, BMI 40-49.9 (morbid obesity) (HCC)   Seasonal depression (HCC)   History of DVT (deep vein thrombosis)     Brief Summary: [name] is an 64 y.o. female past medical history significant for  essential hypertension and morbid obesity found to be short of breath starting several days prior to admission.  She checked her oxygenation and it was 55% - her lips were blue.  In the ED she was found to be hypoxic with a blood pressure of 194/112.  ABG revealed 7.30 4/57/142.  High sensitive troponin at 300. CT angio of the chest showed no PE but mild edema.  Hospital Course:  Acute respiratory failure? 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 If current hypoxia is obesity hypoventilation- ABG revealed PC O2 of 57.5 - pulmonary consulted -  recommended outpatient sleep study - needs 2 L O2 to stay > 90%- needs outpatient f/u with pulmonary for new PFTs  Elevated troponin - Troponin max 393- cardiology recommended stress test - myoview stress test is negative - 2 D ECHO reveals normal EF and mild LVH  HTN - cont Losartan and Metoprolol  Morbid obesity Body mass index is 44.32 kg/m. - needs  weight loss   Discharge Exam: Vitals:   04/06/20 0700 04/06/20 1500  BP: (!) 148/91 (!) 180/101  Pulse: 64 75  Resp:    Temp: 98.2 F (36.8 C) 99 F (37.2 C)  SpO2:  97%   Vitals:   04/06/20 0453 04/06/20 0459 04/06/20 0700 04/06/20 1500  BP: (!) 151/82  (!) 148/91 (!) 180/101  Pulse: 67  64 75  Resp: 16     Temp: (!) 97.5 F (36.4 C)  98.2 F (36.8 C) 99 F (37.2 C)  TempSrc: Oral  Oral Oral  SpO2: 94%   97%  Weight:  128.4 kg    Height:        General: Pt is alert, awake, not in acute distress Cardiovascular: RRR, S1/S2 +, no rubs, no gallops Respiratory: CTA bilaterally, no wheezing, no rhonchi Abdominal: Soft, NT, ND, bowel sounds + Extremities: no edema, no cyanosis   Discharge Instructions   Allergies as of 04/06/2020      Reactions   Aspirin Nausea And Vomiting   Sulfa Antibiotics Nausea Only      Medication List    STOP taking these medications   sertraline 50 MG tablet Commonly known as: ZOLOFT     TAKE these medications   losartan 25 MG tablet Commonly known as: COZAAR Take 25 mg by mouth at bedtime.   metoprolol succinate 100 MG 24 hr tablet Commonly known as: TOPROL-XL Take 100 mg by mouth in the morning and at bedtime. Take  with or immediately following a meal. What changed: Another medication with the same name was removed. Continue taking this medication, and follow the directions you see here.            Durable Medical Equipment  (From admission, onward)         Start     Ordered   04/06/20 0924  For home use only DME oxygen  Once       Question Answer Comment  Length of Need 6 Months   Mode or (Route) Nasal cannula   Liters per Minute 2   Frequency Continuous (stationary and portable oxygen unit needed)   Oxygen conserving device Yes   Oxygen delivery system Gas      04/06/20 0923          Follow-up Information    Rollene Rotunda, MD Follow up on 07/11/2020.   Specialty: Cardiology Why: 10:20AM. Cardiology follow  up. Contact information: [address] [place] [address] [phone]              Allergies  Allergen Reactions  . Aspirin Nausea And Vomiting  . Sulfa Antibiotics Nausea Only      CT Angio Chest PE W and/or Wo Contrast  Result Date: 04/03/2020 CLINICAL DATA:  Shortness of breath fever EXAM: CT ANGIOGRAPHY CHEST WITH CONTRAST TECHNIQUE: Multidetector CT imaging of the chest was performed using the standard protocol during bolus administration of intravenous contrast. Multiplanar CT image reconstructions and MIPs were obtained to evaluate the vascular anatomy. CONTRAST:  OMNIPAQUE IOHEXOL 350 MG/ML SOLN COMPARISON:  None. FINDINGS: Cardiovascular: There is a optimal opacification of the pulmonary arteries. There is no central,segmental, or subsegmental filling defects within the pulmonary arteries. There is mild cardiomegaly. No pericardial effusion or thickening. No evidence right heart strain. There is normal three-vessel brachiocephalic anatomy without proximal stenosis. The thoracic aorta is normal in appearance. Mediastinum/Nodes: No hilar, mediastinal, or axillary adenopathy. Thyroid gland, trachea, and esophagus demonstrate no significant findings. Lungs/Pleura: There is mild peripheral upper lobe ground-glass opacity in interlobular septal thickening. No pleural effusion or pneumothorax. No airspace consolidation. Upper Abdomen: No acute abnormalities present in the visualized portions of the upper abdomen. Musculoskeletal: No chest wall abnormality. No acute or significant osseous findings. Review of the MIP images confirms the above findings. IMPRESSION: No central, segmental, or subsegmental pulmonary embolism. Mild cardiomegaly Minimal bilateral ground-glass opacities in the upper lobe which may be due to mild interstitial edema or atelectasis. Electronically Signed   By: Jonna Clark M.D.   On: 04/03/2020 19:20   NM Myocar Multi W/Spect W/Wall Motion /  EF  Result Date: 04/06/2020 CLINICAL DATA:  Chest pain/angina equivalent EXAM: MYOCARDIAL IMAGING WITH SPECT (REST AND PHARMACOLOGIC-STRESS - 2 DAY PROTOCOL) GATED LEFT VENTRICULAR WALL MOTION STUDY LEFT VENTRICULAR EJECTION FRACTION TECHNIQUE: Standard myocardial SPECT imaging was performed after resting intravenous injection of 32.3 mCi Tc-95m tetrofosmin. Subsequently, on a second day, intravenous infusion of Lexiscan was performed under the supervision of the Cardiology staff. At peak effect of the drug, 28.2 mCi Tc-58m tetrofosmin was injected intravenously and standard myocardial SPECT imaging was performed. Quantitative gated imaging was also performed to evaluate left ventricular wall motion, and estimate left ventricular ejection fraction. COMPARISON:  Chest CT 04/03/2020 FINDINGS: Perfusion: Matched reduced activity in the anterior wall at the apex, potentially from mild scarring. No inducible ischemia identified. Wall Motion: Normal left ventricular wall motion. No left ventricular dilation. Left Ventricular Ejection Fraction: 49 % End diastolic volume 130  ml End systolic volume 66 ml IMPRESSION: 1. Reduced activity probably from scarring in the anterior wall near the apex. 2. Normal left ventricular wall motion. 3. Left ventricular ejection fraction 49% 4. Non invasive risk stratification*: Intermediate (due to ejection fraction) *2012 Appropriate Use Criteria for Coronary Revascularization Focused Update: J Am Coll Cardiol. 2012;59(9):857-881. http://content.dementiazones.com.aspx?articleid=[email or web address] Electronically Signed   By: Gaylyn Rong M.D.   On: 04/06/2020 13:12   DG Chest Portable 1 View  Result Date: 04/03/2020 CLINICAL DATA:  Shortness of breath.  Headache and nausea. EXAM: PORTABLE CHEST 1 VIEW COMPARISON:  Most recent radiograph 07/15/2013 FINDINGS: Chronic cardiomegaly, unchanged. Areas of bilateral lung scarring that appears similar to prior exam. No evidence of acute airspace  disease. Mild peribronchial thickening that may have progressed. No pleural effusion. No pneumothorax. No acute osseous abnormalities are seen. IMPRESSION: 1. Chronic cardiomegaly. Mild peribronchial thickening that may have progressed from prior exam, may represent acute bronchitis. 2. Stable bilateral lung scarring. Electronically Signed   By: Narda Rutherford M.D.   On: 04/03/2020 18:05   ECHOCARDIOGRAM COMPLETE  Result Date: 04/04/2020    ECHOCARDIOGRAM REPORT   Patient Name:   VENDELA TROUNG  Date of Exam: 04/04/2020 Medical Rec #:  [id number]  Height:       67.0 in Accession #:    [id number] Weight:       266.4 lb Date of Birth:  [birth year] BSA:          2.284 m Patient Age:    63 years   BP:           173/106 mmHg Patient Gender: F          HR:           62 bpm. Exam Location:  Inpatient Procedure: 2D Echo, Color Doppler, Cardiac Doppler and Intracardiac            Opacification Agent Indications:    Elevated Troponin  History:        Patient has no prior history of Echocardiogram examinations.                 Risk Factors:Hypertension.  Sonographer:    Irving Burton Senior RDCS Referring Phys: [phone] RONDELL A SMITH  Sonographer Comments: Technically difficult due to patient body habitus. IMPRESSIONS  1. Very technically difficult study, even with Definity contrast - there may be an anteroapical/distal anteroseptal WMA. LVEF appears relatively preserved.. Left ventricular ejection fraction, by estimation, is 50 to 55%. The left ventricle has low normal function. The left ventricle demonstrates regional wall motion abnormalities (see scoring diagram/findings for description). There is mild left ventricular hypertrophy. Left ventricular diastolic parameters are consistent with Grade I diastolic dysfunction (impaired relaxation). There is moderate hypokinesis of the left ventricular, mid-apical anteroseptal wall and apical segment.  2. Right ventricular systolic function is normal. The right ventricular size is normal.   3. Left atrial size was mildly dilated.  4. The mitral valve was not well visualized. No evidence of mitral valve regurgitation.  5. The aortic valve is tricuspid. Aortic valve regurgitation is not visualized.  6. The inferior vena cava is normal in size with greater than 50% respiratory variability, suggesting right atrial pressure of 3 mmHg. FINDINGS  Left Ventricle: Very technically difficult study, even with Definity contrast - there may be an anteroapical/distal anteroseptal WMA. LVEF appears relatively preserved. Left ventricular ejection fraction, by estimation, is 50 to 55%. The left ventricle has low normal function. The left ventricle demonstrates regional wall motion abnormalities.  Moderate hypokinesis of the left ventricular, mid-apical anteroseptal wall and apical segment. Definity contrast agent was given IV to delineate the left ventricular endocardial borders. The left ventricular internal cavity size was normal in size. There is mild left ventricular hypertrophy. Left ventricular diastolic parameters are consistent with Grade I diastolic dysfunction (impaired relaxation). Indeterminate filling pressures. Right Ventricle: The right ventricular size is normal. No increase in right ventricular wall thickness. Right ventricular systolic function is normal. Left Atrium: Left atrial size was mildly dilated. Right Atrium: Right atrial size was normal in size. Pericardium: There is no evidence of pericardial effusion. Mitral Valve: The mitral valve was not well visualized. No evidence of mitral valve regurgitation. Tricuspid Valve: The tricuspid valve is not well visualized. Tricuspid valve regurgitation is not demonstrated. Aortic Valve: The aortic valve is tricuspid. Aortic valve regurgitation is not visualized. Pulmonic Valve: The pulmonic valve was normal in structure. Pulmonic valve regurgitation is not visualized. Aorta: The aortic root and ascending aorta are structurally normal, with no evidence of  dilitation. Venous: The inferior vena cava is normal in size with greater than 50% respiratory variability, suggesting right atrial pressure of 3 mmHg. IAS/Shunts: No atrial level shunt detected by color flow Doppler.  LEFT VENTRICLE PLAX 2D LVIDd:         4.90 cm  Diastology LVIDs:         2.90 cm  LV e' lateral:   4.68 cm/s LV PW:         1.10 cm  LV E/e' lateral: 12.3 LV IVS:        1.10 cm  LV e' medial:    4.79 cm/s LVOT diam:     2.00 cm  LV E/e' medial:  12.0 LV SV:         66 LV SV Index:   29 LVOT Area:     3.14 cm  RIGHT VENTRICLE RV S prime:     15.90 cm/s TAPSE (M-mode): 3.0 cm LEFT ATRIUM             Index       RIGHT ATRIUM           Index LA diam:        3.80 cm 1.66 cm/m  RA Area:     20.00 cm LA Vol (A2C):   89.3 ml 39.09 ml/m RA Volume:   56.70 ml  24.82 ml/m LA Vol (A4C):   77.5 ml 33.93 ml/m LA Biplane Vol: 84.9 ml 37.17 ml/m  AORTIC VALVE LVOT Vmax:   98.30 cm/s LVOT Vmean:  66.400 cm/s LVOT VTI:    0.209 m  AORTA Ao Root diam: 2.90 cm Ao Asc diam:  3.50 cm MITRAL VALVE MV Area (PHT): 2.32 cm    SHUNTS MV Decel Time: 327 msec    Systemic VTI:  0.21 m MV E velocity: 57.40 cm/s  Systemic Diam: 2.00 cm MV A velocity: 80.60 cm/s MV E/A ratio:  0.71 Zoila ShutterKenneth Hilty MD Electronically signed by Zoila ShutterKenneth Hilty MD Signature Date/Time: 04/04/2020/2:37:33 PM    Final      The results of significant diagnostics from this hospitalization (including imaging, microbiology, ancillary and laboratory) are listed below for reference.     Microbiology: Recent Results (from the past 240 hour(s))  SARS Coronavirus 2 by RT PCR (hospital order, performed in [facility] hospital lab) Nasopharyngeal Nasopharyngeal Swab     Status: None   Collection Time: 04/03/20  5:19 PM   Specimen: Nasopharyngeal Swab  Result Value Ref  Range Status   SARS Coronavirus 2 NEGATIVE NEGATIVE Final    Comment: (NOTE) SARS-CoV-2 target nucleic acids are NOT DETECTED.  The SARS-CoV-2 RNA is generally detectable in upper and  lower respiratory specimens during the acute phase of infection. The lowest concentration of SARS-CoV-2 viral copies this assay can detect is 250 copies / mL. A negative result does not preclude SARS-CoV-2 infection and should not be used as the sole basis for treatment or other patient management decisions.  A negative result may occur with improper specimen collection / handling, submission of specimen other than nasopharyngeal swab, presence of viral mutation(s) within the areas targeted by this assay, and inadequate number of viral copies (<250 copies / mL). A negative result must be combined with clinical observations, patient history, and epidemiological information.  Fact Sheet for Patients:   BoilerBrush.com.cy  Fact Sheet for Healthcare Providers: https://pope.com/  This test is not yet approved or  cleared by the [place] FDA and has been authorized for detection and/or diagnosis of SARS-CoV-2 by FDA under an Emergency Use Authorization (EUA).  This EUA will remain in effect (meaning this test can be used) for the duration of the COVID-19 declaration under Section 564(b)(1) of the Act, 21 U.S.C. section 360bbb-3(b)(1), unless the authorization is terminated or revoked sooner.  Performed at [facility], [address] [place], [place] [address]      Labs: BNP (last 3 results) Recent Labs    04/04/20 1345  BNP 204.3*   Basic Metabolic Panel: Recent Labs  Lab 04/03/20 1723 04/03/20 1816 04/05/20 0644 04/06/20 0540  NA 137 140 141 139  K 4.7 4.6 4.2 4.2  CL 100  --  98 97*  CO2 30  --  36* 33*  GLUCOSE 145*  --  106* 119*  BUN 16  --  13 9  CREATININE 0.82  --  0.82 0.78  CALCIUM 8.5*  --  8.4* 8.7*   Liver Function Tests: Recent Labs  Lab 04/03/20 1723  AST 94*  ALT 101*  ALKPHOS 82  BILITOT 0.4  PROT 6.6  ALBUMIN 3.6   No results for input(s): LIPASE, AMYLASE in the last 168  hours. No results for input(s): AMMONIA in the last 168 hours. CBC: Recent Labs  Lab 04/03/20 1723 04/03/20 1816  WBC 7.7  --   NEUTROABS 6.2  --   HGB 15.0 15.6*  HCT 48.1* 46.0  MCV 93.0  --   PLT 231  --    Cardiac Enzymes: No results for input(s): CKTOTAL, CKMB, CKMBINDEX, TROPONINI in the last 168 hours. BNP: Invalid input(s): POCBNP CBG: No results for input(s): GLUCAP in the last 168 hours. D-Dimer No results for input(s): DDIMER in the last 72 hours. Hgb A1c No results for input(s): HGBA1C in the last 72 hours. Lipid Profile Recent Labs    04/05/20 0644  CHOL 187  HDL 30*  LDLCALC 131*  TRIG 128  CHOLHDL 6.2   Thyroid function studies Recent Labs    04/04/20 1345  TSH 4.397   Anemia work up No results for input(s): VITAMINB12, FOLATE, FERRITIN, TIBC, IRON, RETICCTPCT in the last 72 hours. Urinalysis    Component Value Date/Time   COLORURINE ORANGE (A) 08/09/2012 2114   APPEARANCEUR TURBID (A) 08/09/2012 2114   LABSPEC 1.023 08/09/2012 2114   PHURINE 5.0 08/09/2012 2114   GLUCOSEU NEGATIVE 08/09/2012 2114   HGBUR LARGE (A) 08/09/2012 2114   BILIRUBINUR MODERATE (A) 08/09/2012 2114   KETONESUR 15 (A)  08/09/2012 2114   PROTEINUR 30 (A) 08/09/2012 2114   UROBILINOGEN 1.0 08/09/2012 2114   NITRITE POSITIVE (A) 08/09/2012 2114   LEUKOCYTESUR LARGE (A) 08/09/2012 2114   Sepsis Labs Invalid input(s): PROCALCITONIN,  WBC,  LACTICIDVEN Microbiology Recent Results (from the past 240 hour(s))  SARS Coronavirus 2 by RT PCR (hospital order, performed in [facility] hospital lab) Nasopharyngeal Nasopharyngeal Swab     Status: None   Collection Time: 04/03/20  5:19 PM   Specimen: Nasopharyngeal Swab  Result Value Ref Range Status   SARS Coronavirus 2 NEGATIVE NEGATIVE Final    Comment: (NOTE) SARS-CoV-2 target nucleic acids are NOT DETECTED.  The SARS-CoV-2 RNA is generally detectable in upper and lower respiratory specimens during the acute phase of  infection. The lowest concentration of SARS-CoV-2 viral copies this assay can detect is 250 copies / mL. A negative result does not preclude SARS-CoV-2 infection and should not be used as the sole basis for treatment or other patient management decisions.  A negative result may occur with improper specimen collection / handling, submission of specimen other than nasopharyngeal swab, presence of viral mutation(s) within the areas targeted by this assay, and inadequate number of viral copies (<250 copies / mL). A negative result must be combined with clinical observations, patient history, and epidemiological information.  Fact Sheet for Patients:   BoilerBrush.com.cy  Fact Sheet for Healthcare Providers: https://pope.com/  This test is not yet approved or  cleared by the [place] FDA and has been authorized for detection and/or diagnosis of SARS-CoV-2 by FDA under an Emergency Use Authorization (EUA).  This EUA will remain in effect (meaning this test can be used) for the duration of the COVID-19 declaration under Section 564(b)(1) of the Act, 21 U.S.C. section 360bbb-3(b)(1), unless the authorization is terminated or revoked sooner.  Performed at [facility], [address] [place], [place] [address]      Time coordinating discharge in minutes: 65  SIGNED:   Calvert Cantor, MD  [facility] 04/06/2020, 5:36 PM

## 2020-04-06 NOTE — Progress Notes (Signed)
Progress Note  Patient Name: [name] Date of Encounter: 04/06/2020  Primary Cardiologist:   No primary care provider on file.   Subjective   PVCs on tele last night.  Also noted to have a fall in O2 sats while sleeping. No pain.  She has not had palpitations or presyncope.    Inpatient Medications    Scheduled Meds: . enoxaparin (LOVENOX) injection  40 mg Subcutaneous Q24H  . losartan  25 mg Oral QHS  . sertraline  100 mg Oral QHS  . sodium chloride flush  3 mL Intravenous Q12H   Continuous Infusions: . sodium chloride     PRN Meds: sodium chloride, acetaminophen, hydrALAZINE, ondansetron (ZOFRAN) IV, sodium chloride flush   Vital Signs    Vitals:   04/05/20 2032 04/06/20 0453 04/06/20 0459 04/06/20 0700  BP: (!) 143/91 (!) 151/82  (!) 148/91  Pulse: 65 67  64  Resp: 18 16    Temp: 98.2 F (36.8 C) (!) 97.5 F (36.4 C)  98.2 F (36.8 C)  TempSrc: Oral Oral  Oral  SpO2: 94% 94%    Weight:   128.4 kg   Height:        Intake/Output Summary (Last 24 hours) at 04/06/2020 0813 Last data filed at 04/06/2020 0501 Gross per 24 hour  Intake 480 ml  Output 1225 ml  Net -745 ml   Filed Weights   04/04/20 1303 04/05/20 0513 04/06/20 0459  Weight: 130.3 kg 129 kg 128.4 kg     ECG    NA - Personally Reviewed  Physical Exam   GEN: No  acute distress.   Neck: No  JVD Cardiac: RRR, no murmurs, rubs, or gallops.  Respiratory: Clear   to auscultation bilaterally. GI: Soft, nontender, non-distended, normal bowel sounds  MS:  No edema; No deformity. Neuro:   Nonfocal  Psych: Oriented and appropriate    Labs    Chemistry Recent Labs  Lab 04/03/20 1723 04/03/20 1723 04/03/20 1816 04/05/20 0644 04/06/20 0540  NA 137   < > 140 141 139  K 4.7   < > 4.6 4.2 4.2  CL 100  --   --  98 97*  CO2 30  --   --  36* 33*  GLUCOSE 145*  --   --  106* 119*  BUN 16  --   --  13 9  CREATININE 0.82  --   --  0.82 0.78  CALCIUM 8.5*  --   --  8.4* 8.7*  PROT 6.6  --   --    --   --   ALBUMIN 3.6  --   --   --   --   AST 94*  --   --   --   --   ALT 101*  --   --   --   --   ALKPHOS 82  --   --   --   --   BILITOT 0.4  --   --   --   --   GFRNONAA >60  --   --  >60 >60  GFRAA >60  --   --  >60 >60  ANIONGAP 7  --   --  7 9   < > = values in this interval not displayed.     Hematology Recent Labs  Lab 04/03/20 1723 04/03/20 1816  WBC 7.7  --   RBC 5.17*  --   HGB 15.0 15.6*  HCT 48.1* 46.0  MCV  93.0  --   MCH 29.0  --   MCHC 31.2  --   RDW 13.3  --   PLT 231  --     Cardiac EnzymesNo results for input(s): TROPONINI in the last 168 hours. No results for input(s): TROPIPOC in the last 168 hours.   BNP Recent Labs  Lab 04/04/20 1345  BNP 204.3*     DDimer  Recent Labs  Lab 04/03/20 1723  DDIMER 0.52*     Radiology    ECHOCARDIOGRAM COMPLETE  Result Date: 04/04/2020    ECHOCARDIOGRAM REPORT   Patient Name:   [name]  Date of Exam: 04/04/2020 Medical Rec #:  [id number]  Height:       67.0 in Accession #:    [id number] Weight:       266.4 lb Date of Birth:  16-Dec-1955 BSA:          2.284 m Patient Age:    63 years   BP:           173/106 mmHg Patient Gender: F          HR:           62 bpm. Exam Location:  Inpatient Procedure: 2D Echo, Color Doppler, Cardiac Doppler and Intracardiac            Opacification Agent Indications:    Elevated Troponin  History:        Patient has no prior history of Echocardiogram examinations.                 Risk Factors:Hypertension.  Sonographer:    Irving Burton Senior RDCS Referring Phys: [phone] RONDELL A SMITH  Sonographer Comments: Technically difficult due to patient body habitus. IMPRESSIONS  1. Very technically difficult study, even with Definity contrast - there may be an anteroapical/distal anteroseptal WMA. LVEF appears relatively preserved.. Left ventricular ejection fraction, by estimation, is 50 to 55%. The left ventricle has low normal function. The left ventricle demonstrates regional wall motion  abnormalities (see scoring diagram/findings for description). There is mild left ventricular hypertrophy. Left ventricular diastolic parameters are consistent with Grade I diastolic dysfunction (impaired relaxation). There is moderate hypokinesis of the left ventricular, mid-apical anteroseptal wall and apical segment.  2. Right ventricular systolic function is normal. The right ventricular size is normal.  3. Left atrial size was mildly dilated.  4. The mitral valve was not well visualized. No evidence of mitral valve regurgitation.  5. The aortic valve is tricuspid. Aortic valve regurgitation is not visualized.  6. The inferior vena cava is normal in size with greater than 50% respiratory variability, suggesting right atrial pressure of 3 mmHg. FINDINGS  Left Ventricle: Very technically difficult study, even with Definity contrast - there may be an anteroapical/distal anteroseptal WMA. LVEF appears relatively preserved. Left ventricular ejection fraction, by estimation, is 50 to 55%. The left ventricle has low normal function. The left ventricle demonstrates regional wall motion abnormalities. Moderate hypokinesis of the left ventricular, mid-apical anteroseptal wall and apical segment. Definity contrast agent was given IV to delineate the left ventricular endocardial borders. The left ventricular internal cavity size was normal in size. There is mild left ventricular hypertrophy. Left ventricular diastolic parameters are consistent with Grade I diastolic dysfunction (impaired relaxation). Indeterminate filling pressures. Right Ventricle: The right ventricular size is normal. No increase in right ventricular wall thickness. Right ventricular systolic function is normal. Left Atrium: Left atrial size was mildly dilated. Right Atrium: Right atrial size was normal in size.  Pericardium: There is no evidence of pericardial effusion. Mitral Valve: The mitral valve was not well visualized. No evidence of mitral valve  regurgitation. Tricuspid Valve: The tricuspid valve is not well visualized. Tricuspid valve regurgitation is not demonstrated. Aortic Valve: The aortic valve is tricuspid. Aortic valve regurgitation is not visualized. Pulmonic Valve: The pulmonic valve was normal in structure. Pulmonic valve regurgitation is not visualized. Aorta: The aortic root and ascending aorta are structurally normal, with no evidence of dilitation. Venous: The inferior vena cava is normal in size with greater than 50% respiratory variability, suggesting right atrial pressure of 3 mmHg. IAS/Shunts: No atrial level shunt detected by color flow Doppler.  LEFT VENTRICLE PLAX 2D LVIDd:         4.90 cm  Diastology LVIDs:         2.90 cm  LV e' lateral:   4.68 cm/s LV PW:         1.10 cm  LV E/e' lateral: 12.3 LV IVS:        1.10 cm  LV e' medial:    4.79 cm/s LVOT diam:     2.00 cm  LV E/e' medial:  12.0 LV SV:         66 LV SV Index:   29 LVOT Area:     3.14 cm  RIGHT VENTRICLE RV S prime:     15.90 cm/s TAPSE (M-mode): 3.0 cm LEFT ATRIUM             Index       RIGHT ATRIUM           Index LA diam:        3.80 cm 1.66 cm/m  RA Area:     20.00 cm LA Vol (A2C):   89.3 ml 39.09 ml/m RA Volume:   56.70 ml  24.82 ml/m LA Vol (A4C):   77.5 ml 33.93 ml/m LA Biplane Vol: 84.9 ml 37.17 ml/m  AORTIC VALVE LVOT Vmax:   98.30 cm/s LVOT Vmean:  66.400 cm/s LVOT VTI:    0.209 m  AORTA Ao Root diam: 2.90 cm Ao Asc diam:  3.50 cm MITRAL VALVE MV Area (PHT): 2.32 cm    SHUNTS MV Decel Time: 327 msec    Systemic VTI:  0.21 m MV E velocity: 57.40 cm/s  Systemic Diam: 2.00 cm MV A velocity: 80.60 cm/s MV E/A ratio:  0.71 Zoila ShutterKenneth Hilty MD Electronically signed by Zoila ShutterKenneth Hilty MD Signature Date/Time: 04/04/2020/2:37:33 PM    Final     Cardiac Studies   Echo  1. Very technically difficult study, even with Definity contrast - there  may be an anteroapical/distal anteroseptal WMA. LVEF appears relatively  preserved.. Left ventricular ejection fraction,  by estimation, is 50 to  55%. The left ventricle has low  normal function. The left ventricle demonstrates regional wall motion  abnormalities (see scoring diagram/findings for description). There is  mild left ventricular hypertrophy. Left ventricular diastolic parameters  are consistent with Grade I diastolic  dysfunction (impaired relaxation). There is moderate hypokinesis of the  left ventricular, mid-apical anteroseptal wall and apical segment.  2. Right ventricular systolic function is normal. The right ventricular  size is normal.  3. Left atrial size was mildly dilated.  4. The mitral valve was not well visualized. No evidence of mitral valve  regurgitation.  5. The aortic valve is tricuspid. Aortic valve regurgitation is not  visualized.  6. The inferior vena cava is normal in size with greater than 50%  respiratory variability, suggesting right atrial pressure of 3 mmHg.   Patient Profile     64 y.o. 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 Katrinka Blazing.  Assessment & Plan    ELEVATED TROPONIN:  Two day Lexiscan Myoview ordered.  Part one completed.  OK to discharge .   No high risk findings.    There was a questionable old area of decreased uptake in the anterior wall that could have been old scarring.  Nothing on the EKG suggests old infarct.  The echo had poor images but could not exclude an old anterior wall motion abnormality.  I would like to see her back in about 3 months to follow up on this and to make sure that she has her sleep study.    DECREASED LOC:  This was the presenting complaint.  Question sleep apnea. Sats fell as above last night.  She needs to follow up with PCP and have sleep study arranged.    ELEVATED BNP:  EF is difficult to assess on echo.  She does not seem like she is overtly volume overloaded and she  is oxygenating well.  Net negative I/O since admission.  Lasix discontinued yesterday.    No further cardiac work up.    For questions or updates, please contact [facility] Please consult [email or web address] for contact info under Cardiology/STEMI.   Signed, Rollene Rotunda, MD  04/06/2020, 8:13 AM

## 2020-04-06 NOTE — Consult Note (Signed)
NAMELudene Ballard, MRN:  [id number], DOB:  Nov 15, 1955, LOS: 2 ADMISSION DATE:  04/03/2020, CONSULTATION DATE: 04/06/2020 REFERRING MD:  Dr. Butler Denmark, [facility], CHIEF COMPLAINT:  Short of breath   Brief History   64 yo female with morbid obesity admitted with hypoxia, hypercapnia during sleep associated with acute on chronic diastolic CHF with hypertensive emergency (BP 194/112 on admission) likely related to sleep disordered breathing with obstructive sleep apnea and obesity hypoventilation syndrome with a BMI of 44.32.  History of present illness   She was previously followed by Dr. Maple Hudson for hypoxia after being treated for H1N1 influenza pneumonia and lower leg DVTs.  She hasn't used supplemental oxygen for years and last saw Dr. Maple Hudson in 2015.  She has diagnosis of COPD in her past medical history but she never smoked, denies benefit from previous trials of inhaler therapy and previous PFT did not show obstructive lung disease.  Her husband reports that she snores, and will stop breathing while asleep.  She wakes up feeling choked at times.  On 8/29 her daughter, who is a paramedic, was concerned about her breathing pattern while asleep.  She checked her SpO2 at home and was in the 50's.  She was also confused.  Called EMS and wasn't until she was put on supplemental oxygen that her SpO2 improved to the 90's.  She has not had cough, wheeze, sputum, or leg swelling.  No prior history of asthma.  She has not had a sleep study.  Past Medical History  Depression, Morbid obesity (BMI 44.32), HTN, ARDS from H1N1 February 2014, DVT, C diff colitis, Tracheostomy - decannulated in 2014  Consults:  Cardiology  Significant Diagnostic Tests:   Doppler legs b/l 09/25/12 >> subacute DVT Rt common femoral, profunda femoris, femoral, popliteal, posterior tibial, peroneal veins; Lt common femoral, profunda femoris, femoral, popliteal, posterior tibial, peroneal veins  PFT 03/12/13 >> FEV1 2.37 (80%), FEV1% 94, TLC  2.79 (50%), DLCO 60%  ABG 04/03/20 >> pH 7.36, PCO2 57.5, PO2 142  CT angio chest 04/03/20 >> mild peripheral GGO and interlobular septal thickening  Echo 04/04/20 >> EF 50 to 55%, grade 1 DD, moderate hypokinesis of LV/mid-apical anteroseptal wall/apical segment  Micro Data:  COVID 8/29 >> negative  Antimicrobials:    Interim history/subjective:    Objective   Blood pressure (!) 148/91, pulse 64, temperature 98.2 F (36.8 C), temperature source Oral, resp. rate 16, height 5\' 7"  (1.702 m), weight 128.4 kg, SpO2 94 %.        Intake/Output Summary (Last 24 hours) at 04/06/2020 1147 Last data filed at 04/06/2020 0501 Gross per 24 hour  Intake 480 ml  Output 825 ml  Net -345 ml   Filed Weights   04/04/20 1303 04/05/20 0513 04/06/20 0459  Weight: 130.3 kg 129 kg 128.4 kg    Examination:  General - alert Eyes - pupils reactive ENT - no sinus tenderness, no stridor Cardiac - regular rate/rhythm, no murmur Chest - equal breath sounds b/l, no wheezing or rales Abdomen - soft, non tender, + bowel sounds Extremities - no cyanosis, clubbing, or edema Skin - no rashes Neuro - normal strength, moves extremities, follows commands Psych - normal mood and behavior Lymphatics - no lymphadenopathy   Resolved Hospital Problem list     Assessment & Plan:   Acute on chronic hypoxic, hypercapnic respiratory failure. - likely from combination of undiagnosed OSA/OHS with morbid obesity and BMI of 44.32, acute pulmonary edema from acute on chronic diastolic CHF -  no evidence to support that she has COPD >> removed from her past medical history - continue supplemental oxygen to keep her SpO2 90 to 95% - will have her try auto Bipap at night while in hospital - will need to assess her for home oxygen set up prior to discharge - she will need outpatient sleep testing - discussed importance of weight loss - she would like to have f/u with Dr. Maple Hudson in pulmonary office as an outpt after d/c  home  Elevated troponin, acute on chronic diastolic CHF, elevated BNP. - per primary team and cardiology  Social determinants of health. - she does not have health insurance at present - husband reports he will be speaking with transitions of care team to determine options for health insurance - husband reports that they can get a relatively new Bipap machine from a friend to use as an outpatient and would just need to arrange for supplies and adjust pressure settings  Best practice:  Diet: heart healthy DVT prophylaxis: Lovenox GI prophylaxis: not indicated Mobility: OOB as tolerated Code Status: full code  Labs   CBC: Recent Labs  Lab 04/03/20 1723 04/03/20 1816  WBC 7.7  --   NEUTROABS 6.2  --   HGB 15.0 15.6*  HCT 48.1* 46.0  MCV 93.0  --   PLT 231  --     Basic Metabolic Panel: Recent Labs  Lab 04/03/20 1723 04/03/20 1816 04/05/20 0644 04/06/20 0540  NA 137 140 141 139  K 4.7 4.6 4.2 4.2  CL 100  --  98 97*  CO2 30  --  36* 33*  GLUCOSE 145*  --  106* 119*  BUN 16  --  13 9  CREATININE 0.82  --  0.82 0.78  CALCIUM 8.5*  --  8.4* 8.7*   GFR: Estimated Creatinine Clearance: 101.6 mL/min (by C-G formula based on SCr of 0.78 mg/dL). Recent Labs  Lab 04/03/20 1723 04/03/20 1920  WBC 7.7  --   LATICACIDVEN 1.8 1.2    Liver Function Tests: Recent Labs  Lab 04/03/20 1723  AST 94*  ALT 101*  ALKPHOS 82  BILITOT 0.4  PROT 6.6  ALBUMIN 3.6   No results for input(s): LIPASE, AMYLASE in the last 168 hours. No results for input(s): AMMONIA in the last 168 hours.  ABG    Component Value Date/Time   PHART 7.364 04/03/2020 1816   PCO2ART 57.5 (H) 04/03/2020 1816   PO2ART 142 (H) 04/03/2020 1816   HCO3 32.6 (H) 04/03/2020 1816   TCO2 34 (H) 04/03/2020 1816   ACIDBASEDEF 5.0 (H) 08/11/2012 1415   O2SAT 99.0 04/03/2020 1816     Coagulation Profile: No results for input(s): INR, PROTIME in the last 168 hours.  Cardiac Enzymes: No results for  input(s): CKTOTAL, CKMB, CKMBINDEX, TROPONINI in the last 168 hours.  HbA1C: No results found for: HGBA1C  CBG: No results for input(s): GLUCAP in the last 168 hours.  Review of Systems:   Reviewed and negative.  Past Medical History  She,  has a past medical history of DVT (deep venous thrombosis) (HCC), H1N1 influenza, Hypertension, Morbid obesity (HCC), and Seasonal depression (HCC).   Surgical History    Past Surgical History:  Procedure Laterality Date  . CYST EXCISION  1980   Spinal cyst  . KNEE ARTHROSCOPY     bilateral  . TONSILLECTOMY  1980     Social History   reports that she has never smoked. She has never used smokeless  tobacco. She reports that she does not drink alcohol and does not use drugs.   Family History   Her family history includes Arthritis in her maternal grandmother and mother; Diabetes in her father; Heart disease in her father; Hypertension in her brother and mother.   Allergies Allergies  Allergen Reactions  . Aspirin Nausea And Vomiting  . Sulfa Antibiotics Nausea Only     Home Medications  Prior to Admission medications   Medication Sig Start Date End Date Taking? Authorizing Provider  losartan (COZAAR) 25 MG tablet Take 25 mg by mouth at bedtime.   Yes [provider]  metoprolol succinate (TOPROL-XL) 100 MG 24 hr tablet Take 100 mg by mouth in the morning and at bedtime. Take with or immediately following a meal.   Yes [provider]  sertraline (ZOLOFT) 50 MG tablet Take 1 tablet (50 mg total) by mouth at bedtime. Patient taking differently: Take 100 mg by mouth at bedtime.  10/15/12  Yes Angiulli, Mcarthur Rossetti, PA-C  metoprolol succinate (TOPROL-XL) 50 MG 24 hr tablet Take 100 mg by mouth in the morning and at bedtime. Take with or immediately following a meal.  Patient not taking: Reported on 04/04/2020    [provider]     Signature:  Coralyn Helling, MD [facility] Pulmonary/Critical Care Pager - [phone] 04/06/2020, 12:09 PM

## 2020-07-11 ENCOUNTER — Ambulatory Visit: Payer: Self-pay | Admitting: Cardiology

## 2022-07-09 DIAGNOSIS — J449 Chronic obstructive pulmonary disease, unspecified: Secondary | ICD-10-CM | POA: Diagnosis not present

## 2022-07-09 DIAGNOSIS — Z23 Encounter for immunization: Secondary | ICD-10-CM | POA: Diagnosis not present

## 2022-07-09 DIAGNOSIS — F324 Major depressive disorder, single episode, in partial remission: Secondary | ICD-10-CM | POA: Diagnosis not present

## 2022-07-09 DIAGNOSIS — I10 Essential (primary) hypertension: Secondary | ICD-10-CM | POA: Diagnosis not present

## 2022-08-25 DIAGNOSIS — J441 Chronic obstructive pulmonary disease with (acute) exacerbation: Secondary | ICD-10-CM | POA: Diagnosis not present

## 2022-09-20 ENCOUNTER — Emergency Department (HOSPITAL_COMMUNITY): Payer: Medicare HMO

## 2022-09-20 ENCOUNTER — Inpatient Hospital Stay (HOSPITAL_COMMUNITY)
Admission: EM | Admit: 2022-09-20 | Discharge: 2022-09-24 | DRG: 193 | Disposition: A | Discharge status: Home / Self Care | Payer: Medicare HMO | Attending: Internal Medicine | Admitting: Internal Medicine

## 2022-09-20 ENCOUNTER — Encounter (HOSPITAL_COMMUNITY): Payer: Self-pay

## 2022-09-20 DIAGNOSIS — E785 Hyperlipidemia, unspecified: Secondary | ICD-10-CM | POA: Diagnosis present

## 2022-09-20 DIAGNOSIS — Z79899 Other long term (current) drug therapy: Secondary | ICD-10-CM

## 2022-09-20 DIAGNOSIS — J9622 Acute and chronic respiratory failure with hypercapnia: Secondary | ICD-10-CM | POA: Diagnosis present

## 2022-09-20 DIAGNOSIS — J154 Pneumonia due to other streptococci: Secondary | ICD-10-CM | POA: Diagnosis not present

## 2022-09-20 DIAGNOSIS — J9692 Respiratory failure, unspecified with hypercapnia: Secondary | ICD-10-CM | POA: Diagnosis present

## 2022-09-20 DIAGNOSIS — F32A Depression, unspecified: Secondary | ICD-10-CM | POA: Diagnosis present

## 2022-09-20 DIAGNOSIS — Z9981 Dependence on supplemental oxygen: Secondary | ICD-10-CM

## 2022-09-20 DIAGNOSIS — J9601 Acute respiratory failure with hypoxia: Secondary | ICD-10-CM | POA: Diagnosis not present

## 2022-09-20 DIAGNOSIS — R0902 Hypoxemia: Secondary | ICD-10-CM | POA: Diagnosis not present

## 2022-09-20 DIAGNOSIS — Z8261 Family history of arthritis: Secondary | ICD-10-CM

## 2022-09-20 DIAGNOSIS — J810 Acute pulmonary edema: Secondary | ICD-10-CM | POA: Diagnosis present

## 2022-09-20 DIAGNOSIS — Z833 Family history of diabetes mellitus: Secondary | ICD-10-CM

## 2022-09-20 DIAGNOSIS — I214 Non-ST elevation (NSTEMI) myocardial infarction: Secondary | ICD-10-CM | POA: Diagnosis not present

## 2022-09-20 DIAGNOSIS — I10 Essential (primary) hypertension: Secondary | ICD-10-CM | POA: Diagnosis not present

## 2022-09-20 DIAGNOSIS — Z6841 Body Mass Index (BMI) 40.0 and over, adult: Secondary | ICD-10-CM | POA: Diagnosis not present

## 2022-09-20 DIAGNOSIS — J440 Chronic obstructive pulmonary disease with acute lower respiratory infection: Secondary | ICD-10-CM | POA: Diagnosis not present

## 2022-09-20 DIAGNOSIS — R0603 Acute respiratory distress: Secondary | ICD-10-CM | POA: Diagnosis not present

## 2022-09-20 DIAGNOSIS — Z886 Allergy status to analgesic agent status: Secondary | ICD-10-CM

## 2022-09-20 DIAGNOSIS — J9602 Acute respiratory failure with hypercapnia: Secondary | ICD-10-CM | POA: Diagnosis not present

## 2022-09-20 DIAGNOSIS — R55 Syncope and collapse: Secondary | ICD-10-CM | POA: Diagnosis not present

## 2022-09-20 DIAGNOSIS — Z1152 Encounter for screening for COVID-19: Secondary | ICD-10-CM | POA: Diagnosis not present

## 2022-09-20 DIAGNOSIS — Z882 Allergy status to sulfonamides status: Secondary | ICD-10-CM

## 2022-09-20 DIAGNOSIS — I2489 Other forms of acute ischemic heart disease: Secondary | ICD-10-CM | POA: Diagnosis present

## 2022-09-20 DIAGNOSIS — Z597 Insufficient social insurance and welfare support: Secondary | ICD-10-CM

## 2022-09-20 DIAGNOSIS — E872 Acidosis, unspecified: Secondary | ICD-10-CM | POA: Diagnosis present

## 2022-09-20 DIAGNOSIS — G9341 Metabolic encephalopathy: Secondary | ICD-10-CM | POA: Diagnosis not present

## 2022-09-20 DIAGNOSIS — N179 Acute kidney failure, unspecified: Secondary | ICD-10-CM | POA: Diagnosis present

## 2022-09-20 DIAGNOSIS — Z86718 Personal history of other venous thrombosis and embolism: Secondary | ICD-10-CM | POA: Diagnosis not present

## 2022-09-20 DIAGNOSIS — Z8619 Personal history of other infectious and parasitic diseases: Secondary | ICD-10-CM

## 2022-09-20 DIAGNOSIS — F419 Anxiety disorder, unspecified: Secondary | ICD-10-CM | POA: Diagnosis present

## 2022-09-20 DIAGNOSIS — J9621 Acute and chronic respiratory failure with hypoxia: Secondary | ICD-10-CM | POA: Diagnosis present

## 2022-09-20 DIAGNOSIS — Z8249 Family history of ischemic heart disease and other diseases of the circulatory system: Secondary | ICD-10-CM | POA: Diagnosis not present

## 2022-09-20 DIAGNOSIS — J189 Pneumonia, unspecified organism: Secondary | ICD-10-CM | POA: Diagnosis not present

## 2022-09-20 DIAGNOSIS — R0600 Dyspnea, unspecified: Secondary | ICD-10-CM | POA: Diagnosis not present

## 2022-09-20 LAB — I-STAT VENOUS BLOOD GAS, ED
Acid-base deficit: 6 mmol/L — ABNORMAL HIGH (ref 0.0–2.0)
Acid-base deficit: 5 mmol/L — ABNORMAL HIGH (ref 0.0–2.0)
Bicarbonate: 27.1 mmol/L (ref 20.0–28.0)
Bicarbonate: 27.8 mmol/L (ref 20.0–28.0)
Calcium, Ion: 1.2 mmol/L (ref 1.15–1.40)
Calcium, Ion: 1.2 mmol/L (ref 1.15–1.40)
HCT: 46 % (ref 36.0–46.0)
HCT: 47 % — ABNORMAL HIGH (ref 36.0–46.0)
Hemoglobin: 15.6 g/dL — ABNORMAL HIGH (ref 12.0–15.0)
Hemoglobin: 16 g/dL — ABNORMAL HIGH (ref 12.0–15.0)
O2 Saturation: 80 %
O2 Saturation: 69 %
Potassium: 4.1 mmol/L (ref 3.5–5.1)
Potassium: 4.2 mmol/L (ref 3.5–5.1)
Sodium: 141 mmol/L (ref 135–145)
Sodium: 142 mmol/L (ref 135–145)
TCO2: 30 mmol/L (ref 22–32)
TCO2: 31 mmol/L (ref 22–32)
pCO2, Ven: 92.6 mmHg (ref 44–60)
pCO2, Ven: 89.2 mmHg (ref 44–60)
pH, Ven: 7.074 — CL (ref 7.25–7.43)
pH, Ven: 7.102 — CL (ref 7.25–7.43)
pO2, Ven: 64 mmHg — ABNORMAL HIGH (ref 32–45)
pO2, Ven: 50 mmHg — ABNORMAL HIGH (ref 32–45)

## 2022-09-20 LAB — COMPREHENSIVE METABOLIC PANEL
ALT: 50 U/L — ABNORMAL HIGH (ref 0–44)
AST: 86 U/L — ABNORMAL HIGH (ref 15–41)
Albumin: 3.5 g/dL (ref 3.5–5.0)
Alkaline Phosphatase: 67 U/L (ref 38–126)
Anion gap: 13 (ref 5–15)
BUN: 21 mg/dL (ref 8–23)
CO2: 25 mmol/L (ref 22–32)
Calcium: 8.7 mg/dL — ABNORMAL LOW (ref 8.9–10.3)
Chloride: 103 mmol/L (ref 98–111)
Creatinine, Ser: 2.31 mg/dL — ABNORMAL HIGH (ref 0.44–1.00)
GFR, Estimated: 23 mL/min — ABNORMAL LOW (ref >60)
Glucose, Bld: 166 mg/dL — ABNORMAL HIGH (ref 70–99)
Potassium: 4.5 mmol/L (ref 3.5–5.1)
Sodium: 141 mmol/L (ref 135–145)
Total Bilirubin: 0.9 mg/dL (ref 0.3–1.2)
Total Protein: 6.6 g/dL (ref 6.5–8.1)

## 2022-09-20 LAB — CBC WITH DIFFERENTIAL/PLATELET
Abs Immature Granulocytes: 0.12 10*3/uL — ABNORMAL HIGH (ref 0.00–0.07)
Basophils Absolute: 0 10*3/uL (ref 0.0–0.1)
Basophils Relative: 0 %
Eosinophils Absolute: 0 10*3/uL (ref 0.0–0.5)
Eosinophils Relative: 0 %
HCT: 48.2 % — ABNORMAL HIGH (ref 36.0–46.0)
Hemoglobin: 14.8 g/dL (ref 12.0–15.0)
Immature Granulocytes: 1 %
Lymphocytes Relative: 8 %
Lymphs Abs: 0.7 10*3/uL (ref 0.7–4.0)
MCH: 30 pg (ref 26.0–34.0)
MCHC: 30.7 g/dL (ref 30.0–36.0)
MCV: 97.6 fL (ref 80.0–100.0)
Monocytes Absolute: 0.9 10*3/uL (ref 0.1–1.0)
Monocytes Relative: 10 %
Neutro Abs: 7.1 10*3/uL (ref 1.7–7.7)
Neutrophils Relative %: 81 %
Platelets: 240 10*3/uL (ref 150–400)
RBC: 4.94 MIL/uL (ref 3.87–5.11)
RDW: 13.8 % (ref 11.5–15.5)
WBC: 8.9 10*3/uL (ref 4.0–10.5)
nRBC: 0 % (ref 0.0–0.2)

## 2022-09-20 LAB — RESP PANEL BY RT-PCR (RSV, FLU A&B, COVID)  RVPGX2
Influenza A by PCR: NEGATIVE
Influenza B by PCR: NEGATIVE
Resp Syncytial Virus by PCR: NEGATIVE
SARS Coronavirus 2 by RT PCR: NEGATIVE

## 2022-09-20 LAB — TROPONIN I (HIGH SENSITIVITY)
Troponin I (High Sensitivity): 99 ng/L — ABNORMAL HIGH (ref <18)
Troponin I (High Sensitivity): 345 ng/L (ref <18)

## 2022-09-20 LAB — I-STAT CHEM 8, ED
BUN: 25 mg/dL — ABNORMAL HIGH (ref 8–23)
Calcium, Ion: 1.19 mmol/L (ref 1.15–1.40)
Chloride: 102 mmol/L (ref 98–111)
Creatinine, Ser: 2.1 mg/dL — ABNORMAL HIGH (ref 0.44–1.00)
Glucose, Bld: 165 mg/dL — ABNORMAL HIGH (ref 70–99)
HCT: 46 % (ref 36.0–46.0)
Hemoglobin: 15.6 g/dL — ABNORMAL HIGH (ref 12.0–15.0)
Potassium: 4.1 mmol/L (ref 3.5–5.1)
Sodium: 143 mmol/L (ref 135–145)
TCO2: 29 mmol/L (ref 22–32)

## 2022-09-20 LAB — LIPASE, BLOOD: Lipase: 32 U/L (ref 11–51)

## 2022-09-20 LAB — AMMONIA: Ammonia: 42 umol/L — ABNORMAL HIGH (ref 9–35)

## 2022-09-20 LAB — BRAIN NATRIURETIC PEPTIDE: B Natriuretic Peptide: 365.1 pg/mL — ABNORMAL HIGH (ref 0.0–100.0)

## 2022-09-20 MED ORDER — SODIUM CHLORIDE 0.9 % IV SOLN: 1.0000 g | INTRAVENOUS | Status: DC

## 2022-09-20 MED ORDER — DOCUSATE SODIUM 100 MG PO CAPS: 100.0000 mg | ORAL_CAPSULE | Freq: Two times a day (BID) | ORAL | Status: DC | PRN

## 2022-09-20 MED ORDER — POLYETHYLENE GLYCOL 3350 17 G PO PACK: 17.0000 g | PACK | Freq: Every day | ORAL | Status: DC | PRN

## 2022-09-20 MED ORDER — SODIUM CHLORIDE 0.9 % IV SOLN
500.0000 mg | Freq: Once | INTRAVENOUS | Status: AC
  Administered 2022-09-20: 500 mg via INTRAVENOUS
  Filled 2022-09-20: qty 5

## 2022-09-20 MED ORDER — SODIUM CHLORIDE 0.9 % IV SOLN
1.0000 g | Freq: Once | INTRAVENOUS | Status: AC
  Administered 2022-09-20: 1 g via INTRAVENOUS
  Filled 2022-09-20: qty 10

## 2022-09-20 MED ORDER — SODIUM CHLORIDE 0.9 % IV SOLN
500.0000 mg | INTRAVENOUS | Status: DC
  Filled 2022-09-20: qty 5

## 2022-09-20 MED ORDER — HEPARIN SODIUM (PORCINE) 5000 UNIT/ML IJ SOLN
5000.0000 [IU] | Freq: Three times a day (TID) | INTRAMUSCULAR | Status: DC
  Administered 2022-09-21 – 2022-09-24 (×10): 5000 [IU] via SUBCUTANEOUS
  Filled 2022-09-20 (×9): qty 1

## 2022-09-20 NOTE — ED Triage Notes (Signed)
PT BIB EMS for AMS and COPD, family called EMS bc pt was basically unresponsive upon their arrival and on room air, wears 2-3L [place] at home takes it on and off through out the day, but needs continuous oxygen, patient normally walks, and drives, confusion noted at this time. Pt did complain of chest pain yesterday but was not wearing oxygen and said it improved with wearing her O2.   10 Albuterol 1 mg Atrovent 125 mg Solumedrol 2 gm Magnesium End tidal 60 101/78  86 % on room air CBG 169

## 2022-09-20 NOTE — ED Provider Notes (Addendum)
[facility] Provider Note   CSN: [id number] Arrival date & time: 09/20/22  1854     History  Chief Complaint  Patient presents with   Altered Mental Status    COPD    [name] is a 67 y.o. female.  Patient here with altered mental status.  She wears oxygen at baseline from some chronic lung disease.  Supposed to wear between 2 and 3 L of oxygen.  Was found without her oxygen at home with oxygen in the 80s by EMS.  She has been confused.  She got breathing treatments, steroids, magnesium with them.  She has been having maybe some cough and some chest discomfort the last day or 2 but she denies any pain now.  No nausea vomiting or diarrhea.  Family does state that she recently finished a course of antibiotics for presumed pneumonia.  The history is provided by the patient.       Home Medications Prior to Admission medications   Medication Sig Start Date End Date Taking? Authorizing Provider  losartan (COZAAR) 25 MG tablet Take 25 mg by mouth at bedtime.    [provider]  metoprolol succinate (TOPROL-XL) 100 MG 24 hr tablet Take 100 mg by mouth in the morning and at bedtime. Take with or immediately following a meal.    [provider]      Allergies    Aspirin and Sulfa antibiotics    Review of Systems   Review of Systems  Physical Exam Updated Vital Signs BP 129/75   Pulse 92   Temp (!) 97.5 F (36.4 C) (Oral)   Resp 17   SpO2 94%  Physical Exam Vitals and nursing note reviewed.  Constitutional:      General: She is not in acute distress.    Appearance: She is well-developed. She is not ill-appearing.  HENT:     Head: Normocephalic and atraumatic.     Nose: Nose normal.     Mouth/Throat:     Mouth: Mucous membranes are moist.  Eyes:     Extraocular Movements: Extraocular movements intact.     Conjunctiva/sclera: Conjunctivae normal.     Pupils: Pupils are equal, round, and reactive to light.   Cardiovascular:     Rate and Rhythm: Normal rate and regular rhythm.     Pulses: Normal pulses.     Heart sounds: No murmur heard. Pulmonary:     Effort: No respiratory distress.     Breath sounds: Wheezing present.     Comments: Shallow breaths, overall clear breath sounds and may be some fine wheezing Abdominal:     Palpations: Abdomen is soft.     Tenderness: There is no abdominal tenderness.  Musculoskeletal:        General: No swelling.     Cervical back: Normal range of motion and neck supple.  Skin:    General: Skin is warm and dry.     Capillary Refill: Capillary refill takes less than 2 seconds.  Neurological:     General: No focal deficit present.     Mental Status: She is alert.     Comments: Patient is somnolent but arousable, moves all extremities  Psychiatric:        Mood and Affect: Mood normal.     ED Results / Procedures / Treatments   Labs (all labs ordered are listed, but only abnormal results are displayed) Labs Reviewed  CBC WITH DIFFERENTIAL/PLATELET - Abnormal; Notable for the  following components:      Result Value   HCT 48.2 (*)    Abs Immature Granulocytes 0.12 (*)    All other components within normal limits  COMPREHENSIVE METABOLIC PANEL - Abnormal; Notable for the following components:   Glucose, Bld 166 (*)    Creatinine, Ser 2.31 (*)    Calcium 8.7 (*)    AST 86 (*)    ALT 50 (*)    GFR, Estimated 23 (*)    All other components within normal limits  AMMONIA - Abnormal; Notable for the following components:   Ammonia 42 (*)    All other components within normal limits  BRAIN NATRIURETIC PEPTIDE - Abnormal; Notable for the following components:   B Natriuretic Peptide 365.1 (*)    All other components within normal limits  I-STAT VENOUS BLOOD GAS, ED - Abnormal; Notable for the following components:   pH, Ven 7.074 (*)    pCO2, Ven 92.6 (*)    pO2, Ven 64 (*)    Acid-base deficit 6.0 (*)    Hemoglobin 15.6 (*)    All other  components within normal limits  I-STAT CHEM 8, ED - Abnormal; Notable for the following components:   BUN 25 (*)    Creatinine, Ser 2.10 (*)    Glucose, Bld 165 (*)    Hemoglobin 15.6 (*)    All other components within normal limits  I-STAT VENOUS BLOOD GAS, ED - Abnormal; Notable for the following components:   pH, Ven 7.102 (*)    pCO2, Ven 89.2 (*)    pO2, Ven 50 (*)    Acid-base deficit 5.0 (*)    HCT 47.0 (*)    Hemoglobin 16.0 (*)    All other components within normal limits  TROPONIN I (HIGH SENSITIVITY) - Abnormal; Notable for the following components:   Troponin I (High Sensitivity) 99 (*)    All other components within normal limits  TROPONIN I (HIGH SENSITIVITY) - Abnormal; Notable for the following components:   Troponin I (High Sensitivity) 345 (*)    All other components within normal limits  RESP PANEL BY RT-PCR (RSV, FLU A&B, COVID)  RVPGX2  LIPASE, BLOOD  HIV ANTIBODY (ROUTINE TESTING W REFLEX)  CBC  CREATININE, SERUM  CBC  BASIC METABOLIC PANEL  MAGNESIUM  PHOSPHORUS  BLOOD GAS, ARTERIAL    EKG EKG Interpretation  Date/Time:  Thursday September 20 2022 18:56:45 EST Ventricular Rate:  94 PR Interval:  180 QRS Duration: 104 QT Interval:  396 QTC Calculation: 496 R Axis:   57 Text Interpretation: Sinus rhythm Confirmed by Lennice Sites ([id number]) on 09/20/2022 8:49:00 PM  Radiology DG Chest Portable 1 View  Result Date: 09/20/2022 CLINICAL DATA:  Dyspnea EXAM: PORTABLE CHEST 1 VIEW COMPARISON:  08/25/2022 FINDINGS: There is moderate interstitial pulmonary infiltrate diffusely throughout the right lung and within the left perihilar region which may represent atypical infection or asymmetric pulmonary edema. No pneumothorax or pleural effusion. Apparent cardiomegaly and superior mediastinal widening likely relates to semi-erect positioning. No acute bone abnormality. IMPRESSION: 1. Asymmetric pulmonary infiltrate, asymmetric pulmonary edema versus atypical  infection. 2. Apparent cardiomegaly and superior mediastinal widening, possibly related to patient positioning. This could be confirmed with a standard two view chest radiograph. Electronically Signed   By: Fidela Salisbury M.D.   On: 09/20/2022 19:46    Procedures .Critical Care  Performed by: Lennice Sites, DO Authorized by: Lennice Sites, DO   Critical care provider statement:    Critical care time (minutes):  66   Critical care was necessary to treat or prevent imminent or life-threatening deterioration of the following conditions:  Respiratory failure   Critical care was time spent personally by me on the following activities:  Blood draw for specimens, development of treatment plan with patient or surrogate, discussions with primary provider, evaluation of patient's response to treatment, examination of patient, obtaining history from patient or surrogate, ordering and performing treatments and interventions, ordering and review of laboratory studies, ordering and review of radiographic studies, pulse oximetry, re-evaluation of patient's condition and review of old charts   Care discussed with: admitting provider       Medications Ordered in ED Medications  docusate sodium (COLACE) capsule 100 mg (has no administration in time range)  polyethylene glycol (MIRALAX / GLYCOLAX) packet 17 g (has no administration in time range)  heparin injection 5,000 Units (has no administration in time range)  azithromycin (ZITHROMAX) 500 mg in sodium chloride 0.9 % 250 mL IVPB (has no administration in time range)  cefTRIAXone (ROCEPHIN) 1 g in sodium chloride 0.9 % 100 mL IVPB (has no administration in time range)  cefTRIAXone (ROCEPHIN) 1 g in sodium chloride 0.9 % 100 mL IVPB (0 g Intravenous Stopped 09/20/22 2149)  azithromycin (ZITHROMAX) 500 mg in sodium chloride 0.9 % 250 mL IVPB (0 mg Intravenous Stopped 09/20/22 2334)    ED Course/ Medical Decision Making/ A&P                              Medical Decision Making Amount and/or Complexity of Data Reviewed Labs: ordered. Radiology: ordered.  Risk Decision regarding hospitalization.   Kloe Pagett is here with shortness of breath, altered mental status.  She has history of chronic lung disease from prior viral illness and is chronically on 3 L of oxygen.  She was found at home without her oxygen on by family member.  She has had confusion and hypoxia with EMS.  She arrives somnolent but easily arousable.  She denies any pain.  Maybe she had a cough.  She denies any falls.  She is not on blood thinners or really any major medications.  Differential diagnosis concerning for respiratory failure due to hypercarbia, infectious process, volume overload, ACS.  Will get CBC, CMP, troponin, BNP, COVID and flu testing, blood gas and chest x-ray and reevaluate.  Will place her on BiPAP as I am concerned she is retaining CO2.  Repeat VBG with very mild improvement.  pH now at least 7.102, bicarb 89.2.  I have talked with Dr. Ruthann Cancer with ICU and will leave her on BiPAP and have ICU team continue to manage care.  She has been given dose of IV antibiotics in case there is ongoing infectious process.  Her mentation has improved and been stable and believe we can hold off intubation at this time.  This chart was dictated using voice recognition software.  Despite best efforts to proofread,  errors can occur which can change the documentation meaning.         Final Clinical Impression(s) / ED Diagnoses Final diagnoses:  Acute respiratory failure with hypoxia and hypercapnia [facility])    Rx / DC Orders ED Discharge Orders     None         Lennice Sites, DO 09/20/22 2217    Lennice Sites, DO 09/20/22 2341

## 2022-09-20 NOTE — H&P (Incomplete)
NAME:  [name], MRN:  [id number], DOB:  09-30-1955, LOS: 0 ADMISSION DATE:  09/20/2022, CONSULTATION DATE:  09/20/22 REFERRING MD:  Ronnald Nian, CHIEF COMPLAINT:  respiratory failure   History of Present Illness:  [name] is a 67 y.o. F with PMH of HTN and COPD (after H1N1 flu, was intubated with trach) on 3L home O2, remote DVT's who was diagnosed with pneumonia two weeks ago and completed a course of Levaquin.  She felt mostly improved, but still had a nagging cough without fevers.  Today her family found her mostly unresponsive  in bed with her nasal cannula off and called EMS.  She did complain of some chest pain yesterday, but says this was mostly related to coughing and feels improved.  No recent travel or ill contacts, never smoker.   In the ED ABG showed hypercarbic respiratory failure with ABG 7.07/92/64/27, CXR with pulmonary edema and L-sided infiltrate.  Labs significant for creatinine of 2.3, baseline 0.78, BNP 365, trop 99->345, WBC 8.9.  She was treated with Azithromycin and Ceftriaxone and her ABG and mentation improved slightly with bipap, so PCCM consulted for admission  Pertinent  Medical History   has a past medical history of DVT (deep venous thrombosis) ([place]), H1N1 influenza, Hypertension, Morbid obesity ([place]), and Seasonal depression ([place]).   Significant Hospital Events: Including procedures, antibiotic start and stop dates in addition to other pertinent events   2/15 admit to PCCM on bipap  Interim History / Subjective:  Fatigued, but awake and answering questions  Objective   Blood pressure 129/75, pulse 92, temperature (!) 97.5 F (36.4 C), temperature source Oral, resp. rate 17, SpO2 94 %.       No intake or output data in the 24 hours ending 09/20/22 2250 There were no vitals filed for this visit.  General:  overweight F, ill-appearing on bipap in no acute distress HEENT: MM pink/moist, sclera anicteric Neuro: awake, answers questions slowly, following  commands, fatigued  CV: s1s2 rrr, no m/r/g PULM:  decreased air entry bilateral bases without significant wheezing or rhonchi, no accessory muscle use GI: soft, obese Extremities: warm/dry, no edema  Skin: no rashes or lesions  Resolved Hospital Problem list     Assessment & Plan:    Acute on chronic hypoxic and hypercapneic respiratory failure secondary to CAP Baseline COPD and 3L O2 Completed Levaquin outpatient for pna 2 weeks ago and felt improved though still had cough Covid and flu negative -admit to ICU for close monitoring, continue Bipap, increase Ipap from 10 to 12, repeat ABG early AM -continue ceftriaxone and azithromycin -check RVP, sputum culture, urine strep and legionella   Best Practice (right click and "Reselect all SmartList Selections" daily)   Diet/type: {diet type:25684} DVT prophylaxis: {anticoagulation (Optional):25687} GI prophylaxis: [id number] Lines: {Central Venous Access:25771} Foley:  {Central Venous Access:25691} Code Status:  {Code Status:26939} Last date of multidisciplinary goals of care discussion [***]  Labs   CBC: Recent Labs  Lab 09/20/22 1857 09/20/22 1940 09/20/22 2201  WBC 8.9  --   --   NEUTROABS 7.1  --   --   HGB 14.8 15.6*  15.6* 16.0*  HCT 48.2* 46.0  46.0 47.0*  MCV 97.6  --   --   PLT 240  --   --     Basic Metabolic Panel: Recent Labs  Lab 09/20/22 1857 09/20/22 1940 09/20/22 2201  NA 141 143  141 142  K 4.5 4.1  4.1 4.2  CL 103 102  --  CO2 25  --   --   GLUCOSE 166* 165*  --   BUN 21 25*  --   CREATININE 2.31* 2.10*  --   CALCIUM 8.7*  --   --    GFR: CrCl cannot be calculated (Unknown ideal weight.). Recent Labs  Lab 09/20/22 1857  WBC 8.9    Liver Function Tests: Recent Labs  Lab 09/20/22 1857  AST 86*  ALT 50*  ALKPHOS 67  BILITOT 0.9  PROT 6.6  ALBUMIN 3.5   Recent Labs  Lab 09/20/22 1857  LIPASE 32   Recent Labs  Lab 09/20/22 1927  AMMONIA 42*    ABG    Component  Value Date/Time   PHART 7.364 04/03/2020 1816   PCO2ART 57.5 (H) 04/03/2020 1816   PO2ART 142 (H) 04/03/2020 1816   HCO3 27.8 09/20/2022 2201   TCO2 31 09/20/2022 2201   ACIDBASEDEF 5.0 (H) 09/20/2022 2201   O2SAT 69 09/20/2022 2201     Coagulation Profile: No results for input(s): "INR", "PROTIME" in the last 168 hours.  Cardiac Enzymes: No results for input(s): "CKTOTAL", "CKMB", "CKMBINDEX", "TROPONINI" in the last 168 hours.  HbA1C: No results found for: "HGBA1C"  CBG: No results for input(s): "GLUCAP" in the last 168 hours.  Review of Systems:   ***  Past Medical History:  She,  has a past medical history of DVT (deep venous thrombosis) ([place]), H1N1 influenza, Hypertension, Morbid obesity ([place]), and Seasonal depression ([place]).   Surgical History:   Past Surgical History:  Procedure Laterality Date  . CYST EXCISION  1980   Spinal cyst  . KNEE ARTHROSCOPY     bilateral  . TONSILLECTOMY  1980     Social History:   reports that she has never smoked. She has never used smokeless tobacco. She reports that she does not drink alcohol and does not use drugs.   Family History:  Her family history includes Arthritis in her maternal grandmother and mother; Diabetes in her father; Heart disease in her father; Hypertension in her brother and mother.   Allergies Allergies  Allergen Reactions  . Aspirin Nausea And Vomiting  . Sulfa Antibiotics Nausea Only     Home Medications  Prior to Admission medications   Medication Sig Start Date End Date Taking? Authorizing Provider  losartan (COZAAR) 25 MG tablet Take 25 mg by mouth at bedtime.    [provider]  metoprolol succinate (TOPROL-XL) 100 MG 24 hr tablet Take 100 mg by mouth in the morning and at bedtime. Take with or immediately following a meal.    [provider]     Critical care time: ***

## 2022-09-20 NOTE — H&P (Addendum)
NAME:  [name], MRN:  [id number], DOB:  26-Aug-1955, LOS: 0 ADMISSION DATE:  09/20/2022, CONSULTATION DATE:  09/20/22 REFERRING MD:  Ronnald Nian, CHIEF COMPLAINT:  respiratory failure   History of Present Illness:  [name] is a 67 y.o. F with PMH of HTN and COPD (after H1N1 flu, was intubated with trach) on 3L home O2, remote DVT's who was diagnosed with pneumonia two weeks ago and completed a course of Levaquin.  She felt mostly improved, but still had a nagging cough without fevers.  Today her family found her mostly unresponsive  in bed with her nasal cannula off and called EMS.  She did complain of some chest pain yesterday, but says this was mostly related to coughing and feels improved.  No recent travel or ill contacts, never smoker.   In the ED ABG showed hypercarbic respiratory failure with ABG 7.07/92/64/27, CXR with pulmonary edema and L-sided infiltrate.  Labs significant for creatinine of 2.3, baseline 0.78, BNP 365, trop 99->345, WBC 8.9.  She was treated with Azithromycin and Ceftriaxone and her ABG and mentation improved slightly with bipap, so PCCM consulted for admission  Pertinent  Medical History   has a past medical history of DVT (deep venous thrombosis) ([place]), H1N1 influenza, Hypertension, Morbid obesity ([place]), and Seasonal depression ([place]).   Significant Hospital Events: Including procedures, antibiotic start and stop dates in addition to other pertinent events   2/15 admit to PCCM on bipap  Interim History / Subjective:  Fatigued, but awake and answering questions  Objective   Blood pressure 129/75, pulse 92, temperature (!) 97.5 F (36.4 C), temperature source Oral, resp. rate 17, SpO2 94 %.       No intake or output data in the 24 hours ending 09/20/22 2250 There were no vitals filed for this visit.  General:  overweight F, ill-appearing on bipap in no acute distress HEENT: MM pink/moist, sclera anicteric Neuro: awake, answers questions slowly, following  commands, fatigued  CV: s1s2 rrr, no m/r/g PULM:  decreased air entry bilateral bases without significant wheezing or rhonchi, no accessory muscle use GI: soft, obese Extremities: warm/dry, no edema  Skin: no rashes or lesions  Resolved Hospital Problem list     Assessment & Plan:    Acute on chronic hypoxic and hypercarbic respiratory failure secondary to CAP Baseline COPD and 3L O2 Completed Levaquin outpatient for pna 2 weeks ago and felt improved though still had cough Saw Dr. Annamaria Boots at [facility] years ago but lost her insurance so has not followed up Covid and flu negative -admit to ICU for close monitoring, continue Bipap, increase Ipap from 10 to 12, repeat ABG early AM -continue ceftriaxone and azithromycin -check RVP, sputum culture, urine strep and legionella -at risk for intubation, daughter and patient affirm she would want life support if needed  AKI Creatinine 2, baseline <1 -possibly secondary to poor po intake, though does not appear significantly volume depleted vs congestive HF -monitor UOP, BMP and electrolytes and avoid nephrotoxins  Elevated troponin Chest pain BNP 365, troponin 345 Mild chest pain associated with coughing, however troponin up-trending and cardiomegaly on CXR Last echo with preserved EF two yrs ago -trend trop -monitor on telemetry -echo -has allergy to [place] (n/v), hold initiating while on Bipap -hold home cozaar and metoprolol tonight while NPo      Best Practice (right click and "Reselect all SmartList Selections" daily)   Diet/type: NPO DVT prophylaxis: prophylactic heparin  GI prophylaxis: N/A Lines: N/A Foley:  N/A  Code Status:  full code, confirmed with daughter and patient Last date of multidisciplinary goals of care discussion [pending]  Labs   CBC: Recent Labs  Lab 09/20/22 1857 09/20/22 1940 09/20/22 2201  WBC 8.9  --   --   NEUTROABS 7.1  --   --   HGB 14.8 15.6*  15.6* 16.0*  HCT 48.2* 46.0  46.0 47.0*   MCV 97.6  --   --   PLT 240  --   --     Basic Metabolic Panel: Recent Labs  Lab 09/20/22 1857 09/20/22 1940 09/20/22 2201  NA 141 143  141 142  K 4.5 4.1  4.1 4.2  CL 103 102  --   CO2 25  --   --   GLUCOSE 166* 165*  --   BUN 21 25*  --   CREATININE 2.31* 2.10*  --   CALCIUM 8.7*  --   --    GFR: CrCl cannot be calculated (Unknown ideal weight.). Recent Labs  Lab 09/20/22 1857  WBC 8.9    Liver Function Tests: Recent Labs  Lab 09/20/22 1857  AST 86*  ALT 50*  ALKPHOS 67  BILITOT 0.9  PROT 6.6  ALBUMIN 3.5   Recent Labs  Lab 09/20/22 1857  LIPASE 32   Recent Labs  Lab 09/20/22 1927  AMMONIA 42*    ABG    Component Value Date/Time   PHART 7.364 04/03/2020 1816   PCO2ART 57.5 (H) 04/03/2020 1816   PO2ART 142 (H) 04/03/2020 1816   HCO3 27.8 09/20/2022 2201   TCO2 31 09/20/2022 2201   ACIDBASEDEF 5.0 (H) 09/20/2022 2201   O2SAT 69 09/20/2022 2201     Coagulation Profile: No results for input(s): "INR", "PROTIME" in the last 168 hours.  Cardiac Enzymes: No results for input(s): "CKTOTAL", "CKMB", "CKMBINDEX", "TROPONINI" in the last 168 hours.  HbA1C: No results found for: "HGBA1C"  CBG: No results for input(s): "GLUCAP" in the last 168 hours.  Review of Systems:   Please see the history of present illness. All other systems reviewed and are negative    Past Medical History:  She,  has a past medical history of DVT (deep venous thrombosis) ([place]), H1N1 influenza, Hypertension, Morbid obesity ([place]), and Seasonal depression ([place]).   Surgical History:   Past Surgical History:  Procedure Laterality Date   CYST EXCISION  1980   Spinal cyst   KNEE ARTHROSCOPY     bilateral   TONSILLECTOMY  1980     Social History:   reports that she has never smoked. She has never used smokeless tobacco. She reports that she does not drink alcohol and does not use drugs.   Family History:  Her family history includes Arthritis in her maternal  grandmother and mother; Diabetes in her father; Heart disease in her father; Hypertension in her brother and mother.   Allergies Allergies  Allergen Reactions   Aspirin Nausea And Vomiting   Sulfa Antibiotics Nausea Only     Home Medications  Prior to Admission medications   Medication Sig Start Date End Date Taking? Authorizing Provider  losartan (COZAAR) 25 MG tablet Take 25 mg by mouth at bedtime.    [provider]  metoprolol succinate (TOPROL-XL) 100 MG 24 hr tablet Take 100 mg by mouth in the morning and at bedtime. Take with or immediately following a meal.    [provider]     Critical care time: 40 minutes     CRITICAL CARE Performed  by: Otilio Carpen Jaquelyne Firkus   Total critical care time: 40 minutes  Critical care time was exclusive of separately billable procedures and treating other patients.  Critical care was necessary to treat or prevent imminent or life-threatening deterioration.  Critical care was time spent personally by me on the following activities: development of treatment plan with patient and/or surrogate as well as nursing, discussions with consultants, evaluation of patient's response to treatment, examination of patient, obtaining history from patient or surrogate, ordering and performing treatments and interventions, ordering and review of laboratory studies, ordering and review of radiographic studies, pulse oximetry and re-evaluation of patient's condition.  Otilio Carpen Ernie Sagrero, PA-C [facility] See Amion for pager If no response to pager , please call [phone] until 7pm After 7:00 pm call Elink  [phone]?[place]

## 2022-09-21 ENCOUNTER — Other Ambulatory Visit: Payer: Self-pay | Admitting: Cardiology

## 2022-09-21 ENCOUNTER — Inpatient Hospital Stay (HOSPITAL_COMMUNITY): Payer: Medicare HMO

## 2022-09-21 DIAGNOSIS — R7989 Other specified abnormal findings of blood chemistry: Secondary | ICD-10-CM

## 2022-09-21 DIAGNOSIS — I209 Angina pectoris, unspecified: Secondary | ICD-10-CM

## 2022-09-21 DIAGNOSIS — R0603 Acute respiratory distress: Secondary | ICD-10-CM

## 2022-09-21 DIAGNOSIS — I214 Non-ST elevation (NSTEMI) myocardial infarction: Secondary | ICD-10-CM | POA: Diagnosis not present

## 2022-09-21 DIAGNOSIS — J9622 Acute and chronic respiratory failure with hypercapnia: Secondary | ICD-10-CM

## 2022-09-21 LAB — RESPIRATORY PANEL BY PCR

## 2022-09-21 LAB — BLOOD GAS, VENOUS
Acid-Base Excess: 0.6 mmol/L (ref 0.0–2.0)
Bicarbonate: 33.4 mmol/L — ABNORMAL HIGH (ref 20.0–28.0)
Drawn by: 4653
O2 Saturation: 43.7 %
Patient temperature: 37.3
pCO2, Ven: 106 mmHg (ref 44–60)
pH, Ven: 7.11 — CL (ref 7.25–7.43)
pO2, Ven: 32 mmHg (ref 32–45)

## 2022-09-21 LAB — POCT I-STAT 7, (LYTES, BLD GAS, ICA,H+H)
Acid-Base Excess: 2 mmol/L (ref 0.0–2.0)
Acid-Base Excess: 3 mmol/L — ABNORMAL HIGH (ref 0.0–2.0)
Bicarbonate: 31.1 mmol/L — ABNORMAL HIGH (ref 20.0–28.0)
Bicarbonate: 30.8 mmol/L — ABNORMAL HIGH (ref 20.0–28.0)
Calcium, Ion: 1.28 mmol/L (ref 1.15–1.40)
Calcium, Ion: 1.27 mmol/L (ref 1.15–1.40)
HCT: 41 % (ref 36.0–46.0)
HCT: 40 % (ref 36.0–46.0)
Hemoglobin: 13.9 g/dL (ref 12.0–15.0)
Hemoglobin: 13.6 g/dL (ref 12.0–15.0)
O2 Saturation: 97 %
O2 Saturation: 98 %
Patient temperature: 99
Patient temperature: 99
Potassium: 5 mmol/L (ref 3.5–5.1)
Potassium: 4.8 mmol/L (ref 3.5–5.1)
Sodium: 140 mmol/L (ref 135–145)
Sodium: 141 mmol/L (ref 135–145)
TCO2: 33 mmol/L — ABNORMAL HIGH (ref 22–32)
TCO2: 33 mmol/L — ABNORMAL HIGH (ref 22–32)
pCO2 arterial: 72.5 mmHg (ref 32–48)
pCO2 arterial: 63.3 mmHg — ABNORMAL HIGH (ref 32–48)
pH, Arterial: 7.241 — ABNORMAL LOW (ref 7.35–7.45)
pH, Arterial: 7.296 — ABNORMAL LOW (ref 7.35–7.45)
pO2, Arterial: 109 mmHg — ABNORMAL HIGH (ref 83–108)
pO2, Arterial: 113 mmHg — ABNORMAL HIGH (ref 83–108)

## 2022-09-21 LAB — ECHOCARDIOGRAM COMPLETE
Area-P 1/2: 2.76 cm2
Calc EF: 57.6 %
S' Lateral: 3 cm
Single Plane A2C EF: 51.5 %
Single Plane A4C EF: 57.5 %
Weight: 4345.71 oz

## 2022-09-21 LAB — PHOSPHORUS: Phosphorus: 3.1 mg/dL (ref 2.5–4.6)

## 2022-09-21 LAB — BASIC METABOLIC PANEL
Anion gap: 11 (ref 5–15)
BUN: 24 mg/dL — ABNORMAL HIGH (ref 8–23)
CO2: 27 mmol/L (ref 22–32)
Calcium: 8.7 mg/dL — ABNORMAL LOW (ref 8.9–10.3)
Chloride: 102 mmol/L (ref 98–111)
Creatinine, Ser: 1.9 mg/dL — ABNORMAL HIGH (ref 0.44–1.00)
GFR, Estimated: 29 mL/min — ABNORMAL LOW (ref >60)
Glucose, Bld: 171 mg/dL — ABNORMAL HIGH (ref 70–99)
Potassium: 4.6 mmol/L (ref 3.5–5.1)
Sodium: 140 mmol/L (ref 135–145)

## 2022-09-21 LAB — GLUCOSE, CAPILLARY
Glucose-Capillary: 105 mg/dL — ABNORMAL HIGH (ref 70–99)
Glucose-Capillary: 108 mg/dL — ABNORMAL HIGH (ref 70–99)
Glucose-Capillary: 112 mg/dL — ABNORMAL HIGH (ref 70–99)
Glucose-Capillary: 114 mg/dL — ABNORMAL HIGH (ref 70–99)
Glucose-Capillary: 140 mg/dL — ABNORMAL HIGH (ref 70–99)
Glucose-Capillary: 168 mg/dL — ABNORMAL HIGH (ref 70–99)
Glucose-Capillary: 96 mg/dL (ref 70–99)

## 2022-09-21 LAB — CBC
HCT: 46.4 % — ABNORMAL HIGH (ref 36.0–46.0)
Hemoglobin: 14.7 g/dL (ref 12.0–15.0)
MCH: 29.8 pg (ref 26.0–34.0)
MCHC: 31.7 g/dL (ref 30.0–36.0)
MCV: 94.1 fL (ref 80.0–100.0)
Platelets: 197 10*3/uL (ref 150–400)
RBC: 4.93 MIL/uL (ref 3.87–5.11)
RDW: 13.7 % (ref 11.5–15.5)
WBC: 7.4 10*3/uL (ref 4.0–10.5)
nRBC: 0 % (ref 0.0–0.2)

## 2022-09-21 LAB — MAGNESIUM: Magnesium: 2.5 mg/dL — ABNORMAL HIGH (ref 1.7–2.4)

## 2022-09-21 LAB — MRSA NEXT GEN BY PCR, NASAL: MRSA by PCR Next Gen: NOT DETECTED

## 2022-09-21 LAB — TROPONIN I (HIGH SENSITIVITY)
Troponin I (High Sensitivity): 802 ng/L (ref <18)
Troponin I (High Sensitivity): 870 ng/L (ref <18)

## 2022-09-21 LAB — HIV ANTIBODY (ROUTINE TESTING W REFLEX): HIV Screen 4th Generation wRfx: NONREACTIVE

## 2022-09-21 LAB — STREP PNEUMONIAE URINARY ANTIGEN: Strep Pneumo Urinary Antigen: POSITIVE — AB

## 2022-09-21 MED ORDER — INSULIN ASPART 100 UNIT/ML IJ SOLN
1.0000 [IU] | INTRAMUSCULAR | Status: DC
  Administered 2022-09-21: 1 [IU] via SUBCUTANEOUS
  Administered 2022-09-22: 2 [IU] via SUBCUTANEOUS
  Administered 2022-09-23: 1 [IU] via SUBCUTANEOUS

## 2022-09-21 MED ORDER — ORAL CARE MOUTH RINSE
15.0000 mL | OROMUCOSAL | Status: DC
  Administered 2022-09-21 – 2022-09-22 (×6): 15 mL via OROMUCOSAL

## 2022-09-21 MED ORDER — ROSUVASTATIN CALCIUM 20 MG PO TABS
20.0000 mg | ORAL_TABLET | Freq: Every day | ORAL | Status: DC
  Administered 2022-09-21 – 2022-09-24 (×4): 20 mg via ORAL
  Filled 2022-09-21 (×4): qty 1

## 2022-09-21 MED ORDER — LOSARTAN POTASSIUM 25 MG PO TABS: 25.0000 mg | ORAL_TABLET | Freq: Every day | ORAL | Status: DC

## 2022-09-21 MED ORDER — FUROSEMIDE 10 MG/ML IJ SOLN
20.0000 mg | Freq: Once | INTRAMUSCULAR | Status: AC
  Administered 2022-09-21: 20 mg via INTRAVENOUS
  Filled 2022-09-21: qty 2

## 2022-09-21 MED ORDER — METOPROLOL SUCCINATE ER 25 MG PO TB24
25.0000 mg | ORAL_TABLET | Freq: Every day | ORAL | Status: DC
  Administered 2022-09-21 – 2022-09-22 (×2): 25 mg via ORAL
  Filled 2022-09-21 (×2): qty 1

## 2022-09-21 MED ORDER — ORAL CARE MOUTH RINSE: 15.0000 mL | OROMUCOSAL | Status: DC | PRN

## 2022-09-21 MED ORDER — CHLORHEXIDINE GLUCONATE CLOTH 2 % EX PADS
6.0000 | MEDICATED_PAD | Freq: Every day | CUTANEOUS | Status: DC
  Administered 2022-09-22: 6 via TOPICAL

## 2022-09-21 MED ORDER — CLOPIDOGREL BISULFATE 75 MG PO TABS
300.0000 mg | ORAL_TABLET | Freq: Once | ORAL | Status: AC
  Administered 2022-09-21: 300 mg via ORAL
  Filled 2022-09-21: qty 4

## 2022-09-21 MED ORDER — SODIUM CHLORIDE 0.9 % IV SOLN
2.0000 g | INTRAVENOUS | Status: DC
  Administered 2022-09-21 – 2022-09-23 (×3): 2 g via INTRAVENOUS
  Filled 2022-09-21 (×3): qty 20

## 2022-09-21 MED ORDER — CLOPIDOGREL BISULFATE 75 MG PO TABS
75.0000 mg | ORAL_TABLET | Freq: Every day | ORAL | Status: DC
  Administered 2022-09-22 – 2022-09-24 (×3): 75 mg via ORAL
  Filled 2022-09-21 (×3): qty 1

## 2022-09-21 NOTE — Progress Notes (Signed)
  Echocardiogram 2D Echocardiogram has been performed.  [name] 09/21/2022, 1:38 PM

## 2022-09-21 NOTE — Progress Notes (Signed)
eLink Physician-Brief Progress Note Patient Name: [name] DOB: 10/16/1955 MRN: [id number]   Date of Service  09/21/2022  HPI/Events of Note  Unfortunate lady with chronic respiratory failure and a tracheostomy form remote H1N1 flu infection admitted tonight with pneumonia, CHF and pulmonary edema resulting in acute on chronic hypoxemic / hypercapnic respiratory failure.  eICU Interventions  New Patient Evaluation.        Kerry Kass Aviv Rota 09/21/2022, 2:24 AM

## 2022-09-21 NOTE — Progress Notes (Addendum)
NAME:  [name], MRN:  [id number], DOB:  [birth year], LOS: 1 ADMISSION DATE:  09/20/2022, CONSULTATION DATE:  09/21/22 REFERRING MD:  Ronnald Nian, CHIEF COMPLAINT:  respiratory failure   History of Present Illness:  [name] is a 67 y.o. F with PMH of HTN and COPD (after H1N1 flu, was intubated with trach) on 3L home O2, remote DVT's who was diagnosed with pneumonia two weeks ago and completed a course of Levaquin.  She felt mostly improved, but still had a nagging cough without fevers.  Today her family found her mostly unresponsive  in bed with her nasal cannula off and called EMS.  She did complain of some chest pain yesterday, but says this was mostly related to coughing and feels improved.  No recent travel or ill contacts, never smoker.   In the ED ABG showed hypercarbic respiratory failure with ABG 7.07/92/64/27, CXR with pulmonary edema and L-sided infiltrate.  Labs significant for creatinine of 2.3, baseline 0.78, BNP 365, trop 99->345, WBC 8.9.  She was treated with Azithromycin and Ceftriaxone and her ABG and mentation improved slightly with bipap, so PCCM consulted for admission  Pertinent  Medical History   has a past medical history of DVT (deep venous thrombosis) ([place]), H1N1 influenza, Hypertension, Morbid obesity ([place]), and Seasonal depression ([place]).   Significant Hospital Events: Including procedures, antibiotic start and stop dates in addition to other pertinent events   2/15 admit to PCCM on bipap 2/15 CXR  asymmetrical pulmonary infiltrate R>L and L perihilar region, asymmetrical pulmonary edema vs atypical infection Cardiomegaly  2/16 Urine strep antigen +; D/C azithromycin, continue on ceftriaxone   Interim History / Subjective:  Conversant, feeling better than prior to admission. Denies cough, sputum production. Patient not on inhalers at home. Feeling sick for the past week with poor po intake. Uses 3L O2 at [facility].  Objective   Blood pressure 132/81, pulse (!) 58,  temperature 98.8 F (37.1 C), temperature source Axillary, resp. rate 16, weight 123.2 kg, SpO2 96 %.    BIPAP settings FiO2 (%):  [50 %] 50 %  Vt 400 RR 15 iPAP 14 (12, previously) ePAP 6  Intake/Output Summary (Last 24 hours) at 09/21/2022 1132 Last data filed at 09/21/2022 [id number] Gross per 24 hour  Intake --  Output 120 ml  Net -120 ml   Filed Weights   09/21/22 0258  Weight: 123.2 kg    General:  overweight F, ill-appearing on bipap in no acute distress HEENT: MM pink/moist, sclera anicteric Neuro: awake, A&Ox3 answering questions appropriately, no somnolence, following commands CV: s1s2 rrr, no m/r/g PULM: no increased WOB, speaking in full sentences, good air exchange bilaterally without wheezing or rhonchi GI: soft, obese Extremities: warm/dry, no edema  Skin: no rashes or lesions  Significant labs: CBC wnl BMP: compared to 2/15 HCO3 27 (25) BUN 24 (24) Cr. 1.90 (2.10 ) GFR 29 (23)  Arterial ABG 12/16  7.24 PCO2 72.5 Prior venous ABG 07/21/23 7.11, PCO2 106  +strep pneumo antigen -MRSA PCR -RVP  2/14 troponin 99 ->345 Reviewed prior TTE with LVEF 55% in 2021  Resolved Hospital Problem list     Assessment & Plan:   Acute on chronic hypoxic and hypercarbic respiratory failure 2/2 Strep pneumonia Baseline 3L O2, Patient without COPD on PFT 2014 or CT scan ?[facility] Hx of O2 dependence since ARDS for H1N1 CXR concerning for atypical PNA vs pulmonary edema, now Strep Ag positive Patient is mentating well, stable. Will continue management for PNA On BiPAP;  ePAP increased to 14 (from 12). F/u repeat arterial ABG -Continue on CTX. Azithromycin now d/c'd -Patient consented to intubation if needed  Elevated troponin Chest pain, resolved BNP 365, troponin 345 on admission.  TTE 2021 with EF 50%. NSR on telemetry and euvolemic on exam CXR with cardiomegaly and concerning for pulmonary edema F/u Repeat troponin and TTE this am -Trial of 20 mg lasix  today -Monitor urine output and telemetry  AKI Cr improving at 1.9 from 2.10 Baseline <1. Likely pre-renal 2/2 low po intake in setting of pNA Euvolemic on exam today Will f/u TTE to manage fluid status. Will monitor Cr with trial of lasix today -monitor UOP, BMP and electrolytes and avoid nephrotoxins  HTN Holding home coozar and metoprolol while NPO  Best Practice (right click and "Reselect all SmartList Selections" daily)   Diet/type: NPO DVT prophylaxis: prophylactic heparin  GI prophylaxis: N/A Lines: N/A Foley:  N/A Code Status:  full code, confirmed with daughter and patient on admission Last date of multidisciplinary goals of care discussion [pending]  Labs   CBC: Recent Labs  Lab 09/20/22 1857 09/20/22 1940 09/20/22 2201 09/21/22 0221 09/21/22 0844  WBC 8.9  --   --  7.4  --   NEUTROABS 7.1  --   --   --   --   HGB 14.8 15.6*  15.6* 16.0* 14.7 13.9  HCT 48.2* 46.0  46.0 47.0* 46.4* 41.0  MCV 97.6  --   --  94.1  --   PLT 240  --   --  197  --     Basic Metabolic Panel: Recent Labs  Lab 09/20/22 1857 09/20/22 1940 09/20/22 2201 09/21/22 0221 09/21/22 0844  NA 141 143  141 142 140 140  K 4.5 4.1  4.1 4.2 4.6 5.0  CL 103 102  --  102  --   CO2 25  --   --  27  --   GLUCOSE 166* 165*  --  171*  --   BUN 21 25*  --  24*  --   CREATININE 2.31* 2.10*  --  1.90*  --   CALCIUM 8.7*  --   --  8.7*  --   MG  --   --   --  2.5*  --   PHOS  --   --   --  3.1  --    GFR: CrCl cannot be calculated (Unknown ideal weight.). Recent Labs  Lab 09/20/22 1857 09/21/22 0221  WBC 8.9 7.4    Liver Function Tests: Recent Labs  Lab 09/20/22 1857  AST 86*  ALT 50*  ALKPHOS 67  BILITOT 0.9  PROT 6.6  ALBUMIN 3.5   Recent Labs  Lab 09/20/22 1857  LIPASE 32   Recent Labs  Lab 09/20/22 1927  AMMONIA 42*    ABG    Component Value Date/Time   PHART 7.241 (L) 09/21/2022 0844   PCO2ART 72.5 (HH) 09/21/2022 0844   PO2ART 109 (H) 09/21/2022 0844    HCO3 31.1 (H) 09/21/2022 0844   TCO2 33 (H) 09/21/2022 0844   ACIDBASEDEF 5.0 (H) 09/20/2022 2201   O2SAT 97 09/21/2022 0844     Coagulation Profile: No results for input(s): "INR", "PROTIME" in the last 168 hours.  Cardiac Enzymes: No results for input(s): "CKTOTAL", "CKMB", "CKMBINDEX", "TROPONINI" in the last 168 hours.  HbA1C: No results found for: "HGBA1C"  CBG: Recent Labs  Lab 09/21/22 0131 09/21/22 0330 09/21/22 0753 09/21/22 1107  GLUCAP 168* 140* 108*  114*    Review of Systems:   Please see the history of present illness. All other systems reviewed and are negative    Past Medical History:  She,  has a past medical history of DVT (deep venous thrombosis) ([place]), H1N1 influenza, Hypertension, Morbid obesity ([place]), and Seasonal depression ([place]).   Surgical History:   Past Surgical History:  Procedure Laterality Date   CYST EXCISION  1980   Spinal cyst   KNEE ARTHROSCOPY     bilateral   TONSILLECTOMY  1980     Social History:   reports that she has never smoked. She has never used smokeless tobacco. She reports that she does not drink alcohol and does not use drugs.   Family History:  Her family history includes Arthritis in her maternal grandmother and mother; Diabetes in her father; Heart disease in her father; Hypertension in her brother and mother.   Allergies Allergies  Allergen Reactions   Aspirin Nausea And Vomiting   Sulfa Antibiotics Nausea Only     Home Medications  Prior to Admission medications   Medication Sig Start Date End Date Taking? Authorizing Provider  losartan (COZAAR) 25 MG tablet Take 25 mg by mouth at bedtime.    [provider]  metoprolol succinate (TOPROL-XL) 100 MG 24 hr tablet Take 100 mg by mouth in the morning and at bedtime. Take with or immediately following a meal.    [provider]     Critical care time: 40 minutes    Romana Juniper, MD [facility] Internal Medicine Program -  PGY-1 09/21/2022, 12:09 PM   See Amion for pager If no response to pager , please call [phone] until 7pm After 7:00 pm call Elink  [phone]?[place]

## 2022-09-21 NOTE — Progress Notes (Signed)
Unable to obtain abg x3

## 2022-09-21 NOTE — Consult Note (Addendum)
Cardiology Consultation   Patient ID: Sloan Snawder MRN: [id number]; DOB: [birth year]  Admit date: 09/20/2022 Date of Consult: 09/21/2022  PCP:  Shirline Frees, MD   [place] [facility] Cardiologist:  Hochrein   Patient Profile:   Shiron Eastham is a 67 y.o. female with a hx of HTN, COPD on home O2, depression, history of DVT who is being seen 09/21/2022 for the evaluation of elevated troponin at the request of Dr. Vaughan Browner.  History of Present Illness:   Ms. Moskal is a 67 year old female with above medical history. Patient denies past cardiac history. She was admitted to the hospital in 03/2020 after she was found unable to be awakened. Cardiology was consulted because patient had elevated troponins [phone]. She had an echocardiogram on 04/04/20 that showed EP 50-55% with a possible anteroapical/distal anteroseptal WMA. Patient had a nuclear stress test on 04/06/20 that showed reduced activity probably from scarring in the anterior wall near apex, EF 49%. Patient was instructed to get an outpatient sleep study. However, patient lost her insurance around that time. She did not get an outpatient sleep study and she did not followu p with cardiology.    Patient was diagnosed with pneumonia about 2 weeks ago and completed a course of Levaquin. Patient reported that she felt somewhat better after completing her course of antibiotics, but she continued to have a productive cough, fatigue, fever, chills, body aches.   Patient was found unresponsive in her bed on 2/15 by her family and EMS was called. Patient received breathing treatments, steroids, and magnesium with EMS. Upon arrival to the ED, her BP was 101/78 and her oxygen saturation was 86% on room air. Labs in the ED showed Na 141, K 4.5, creatinine 2.31, WBC 8.9, hemoglobin 14.8, platelets 240. BNP elevated to 365. COVID, flu, RSV negative.   CXR showed asymmetric opulmonary infiltrate, could represent asymmetric pulmonary edema vs  atypical infection. Venous blood gas with pH 7.07, pCO2 92.6.   hsTn 99>345>802.  Patient was treated with Bi-Pap, and her mentation improved. She admitted to [facility] service for treatment of acute on chronic hypoxic and hypercarbic respiratory failure secondary do strep pneumonia. Cardiology was consulted for elevated troponin. Echocardiogram 09/21/22 showed EF 60-65%, no regional wall motion abnormalities, moderate LVH, grade I diastolic dysfunction, normal RV systolic function.   On interview, patient reports that she had pneumonia 2 weeks ago. She completed her course of antibiotics and steroids, but continued to have fatigue, productive cough, chills, body aches. Yesterday, she did not take her oxygen with her when she left the house. She had midsternal chest discomfort that radiated to her jaw and her left shoulder. Pain was intermittent, eventually was relieved with replacing supplemental oxygen. Patient denies ever having chest pain before. She is fairly active in her day to day life, and does not have chest pain on exertion. However, that evening, patient again had an episode of chest pain that occurred before she was found unresponsive. She did take off her oxygen prior to being found unresponsive as well. Patient denies personal cardiac history. Her father did have a heart attack when he was in his 47s. She does have HTN, denies know history of cholesterol issues. No tobacco use    Past Medical History:  Diagnosis Date   DVT (deep venous thrombosis) (HCC)    H1N1 influenza    Hypertension    Morbid obesity ([place])    Seasonal depression ([place])     Past Surgical History:  Procedure  Laterality Date   CYST EXCISION  1980   Spinal cyst   KNEE ARTHROSCOPY     bilateral   TONSILLECTOMY  1980     Home Medications:  Prior to Admission medications   Medication Sig Start Date End Date Taking? Authorizing Provider  losartan (COZAAR) 25 MG tablet Take 25 mg by mouth at bedtime.    [provider]  metoprolol succinate (TOPROL-XL) 100 MG 24 hr tablet Take 100 mg by mouth in the morning and at bedtime. Take with or immediately following a meal.    [provider]    Inpatient Medications: Scheduled Meds:  Chlorhexidine Gluconate Cloth  6 each Topical Q0600   heparin  5,000 Units Subcutaneous Q8H   insulin aspart  1-3 Units Subcutaneous Q4H   mouth rinse  15 mL Mouth Rinse 4 times per day   Continuous Infusions:  cefTRIAXone (ROCEPHIN)  IV     PRN Meds: docusate sodium, mouth rinse, polyethylene glycol  Allergies:    Allergies  Allergen Reactions   Aspirin Nausea And Vomiting   Sulfa Antibiotics Nausea Only    Social History:   Social History   Socioeconomic History   Marital status: Married    Spouse name: Not on file   Number of children: Not on file   Years of education: Not on file   Highest education level: Not on file  Occupational History   Not on file  Tobacco Use   Smoking status: Never   Smokeless tobacco: Never   Tobacco comments:    never used product  Substance and Sexual Activity   Alcohol use: No   Drug use: No   Sexual activity: Yes    Birth control/protection: None  Other Topics Concern   Not on file  Social History Narrative   Married   3 children   70 yo grandson who lives with them most of the time   Social Determinants of Health   Financial Resource Strain: Not on file  Food Insecurity: Not on file  Transportation Needs: Not on file  Physical Activity: Not on file  Stress: Not on file  Social Connections: Not on file  Intimate Partner Violence: Not on file    Family History:    Family History  Problem Relation Age of Onset   Heart disease Father    Diabetes Father    Hypertension Mother    Arthritis Mother    Hypertension Brother    Arthritis Maternal Grandmother      ROS:  Please see the history of present illness.   All other ROS reviewed and negative.     Physical Exam/Data:   Vitals:    09/21/22 1146 09/21/22 1200 09/21/22 1300 09/21/22 1400  BP: 118/78 (!) 131/98 132/87 (!) 156/82  Pulse: 62 61 66 74  Resp: 16 (!) 22 16 19  $ Temp:      TempSrc:      SpO2: 95% 95% 93% 93%  Weight:        Intake/Output Summary (Last 24 hours) at 09/21/2022 1455 Last data filed at 09/21/2022 [id number] Gross per 24 hour  Intake --  Output 120 ml  Net -120 ml      09/21/2022    2:58 AM 04/06/2020    4:59 AM 04/05/2020    5:13 AM  Last 3 Weights  Weight (lbs) 271 lb 9.7 oz 283 lb 284 lb 8 oz  Weight (kg) 123.2 kg 128.368 kg 129.048 kg     Body mass  index is 42.54 kg/m.  General:  Well nourished, well developed, in no acute distress. Lying in the bed with head elevated. Wearing [place]  HEENT: normal Neck: no JVD with head elevated to 30 degrees  Vascular: Radial and dorsalis pedis pulses 2+ bilaterally Cardiac:  normal S1, S2; RRR; no murmur  Lungs:  crackles throughout that clear with cough. Normal WOB on 3 L supplemental oxygen via [place]  Abd: soft, nontender Ext: no edema in BLE  Musculoskeletal:  No deformities, BUE and BLE strength normal and equal Skin: warm and dry  Neuro:  CNs 2-12 intact, no focal abnormalities noted Psych:  Normal affect   EKG:  The EKG was personally reviewed and demonstrates:  EKG on presentation showed sinus rhythm with HR 94 BPM,nonspecific ST changes  EKG on 2/16 showed  Telemetry:  Telemetry was personally reviewed and demonstrates:  NSR, HR in the 60s   Relevant CV Studies:  Echocardiogram 09/21/22  1. Left ventricular ejection fraction, by estimation, is 60 to 65%. The  left ventricle has normal function. The left ventricle has no regional  wall motion abnormalities. There is moderate left ventricular hypertrophy.  Left ventricular diastolic  parameters are consistent with Grade I diastolic dysfunction (impaired  relaxation).   2. Right ventricular systolic function is normal. The right ventricular  size is normal.   3. The mitral valve is normal  in structure. No evidence of mitral valve  regurgitation. No evidence of mitral stenosis. Severe mitral annular  calcification.   4. The aortic valve is calcified. There is mild calcification of the  aortic valve. There is mild thickening of the aortic valve. Aortic valve  regurgitation is not visualized. Aortic valve sclerosis is present, with  no evidence of aortic valve stenosis.   5. The inferior vena cava is dilated in size with <50% respiratory  variability, suggesting right atrial pressure of 15 mmHg.   Laboratory Data:  High Sensitivity Troponin:   Recent Labs  Lab 09/20/22 1857 09/20/22 2200 09/21/22 1229  TROPONINIHS 99* 345* 802*     Chemistry Recent Labs  Lab 09/20/22 1857 09/20/22 1940 09/20/22 2201 09/21/22 0221 09/21/22 0844 09/21/22 1141  NA 141 143  141   < > 140 140 141  K 4.5 4.1  4.1   < > 4.6 5.0 4.8  CL 103 102  --  102  --   --   CO2 25  --   --  27  --   --   GLUCOSE 166* 165*  --  171*  --   --   BUN 21 25*  --  24*  --   --   CREATININE 2.31* 2.10*  --  1.90*  --   --   CALCIUM 8.7*  --   --  8.7*  --   --   MG  --   --   --  2.5*  --   --   GFRNONAA 23*  --   --  29*  --   --   ANIONGAP 13  --   --  11  --   --    < > = values in this interval not displayed.    Recent Labs  Lab 09/20/22 1857  PROT 6.6  ALBUMIN 3.5  AST 86*  ALT 50*  ALKPHOS 67  BILITOT 0.9   Lipids No results for input(s): "CHOL", "TRIG", "HDL", "LABVLDL", "LDLCALC", "CHOLHDL" in the last 168 hours.  Hematology Recent Labs  Lab 09/20/22 1857 09/20/22  1940 09/21/22 0221 09/21/22 0844 09/21/22 1141  WBC 8.9  --  7.4  --   --   RBC 4.94  --  4.93  --   --   HGB 14.8   < > 14.7 13.9 13.6  HCT 48.2*   < > 46.4* 41.0 40.0  MCV 97.6  --  94.1  --   --   MCH 30.0  --  29.8  --   --   MCHC 30.7  --  31.7  --   --   RDW 13.8  --  13.7  --   --   PLT 240  --  197  --   --    < > = values in this interval not displayed.   Thyroid No results for input(s): "TSH",  "FREET4" in the last 168 hours.  BNP Recent Labs  Lab 09/20/22 1857  BNP 365.1*    DDimer No results for input(s): "DDIMER" in the last 168 hours.   Radiology/Studies:  ECHOCARDIOGRAM COMPLETE  Result Date: 09/21/2022    ECHOCARDIOGRAM REPORT   Patient Name:   BURKLEE BI  Date of Exam: 09/21/2022 Medical Rec #:  [id number]  Height:       67.0 in Accession #:    [id number] Weight:       271.6 lb Date of Birth:  08-Apr-1956 BSA:          2.303 m Patient Age:    70 years   BP:           144/83 mmHg Patient Gender: F          HR:           67 bpm. Exam Location:  Inpatient Procedure: 2D Echo, 3D Echo, Cardiac Doppler and Color Doppler Indications:    Acute respiratory distress  History:        Patient has prior history of Echocardiogram examinations, most                 recent 04/04/2020. COPD, Signs/Symptoms:Shortness of Breath and                 Dyspnea; Risk Factors:Hypertension. Pulmonary embolus. Pulmonary                 edema.  Sonographer:    Roseanna Rainbow RDCS Referring Phys: [id number] Francesca Jewett  Sonographer Comments: Patient is obese. Image acquisition challenging due to patient body habitus. IMPRESSIONS  1. Left ventricular ejection fraction, by estimation, is 60 to 65%. The left ventricle has normal function. The left ventricle has no regional wall motion abnormalities. There is moderate left ventricular hypertrophy. Left ventricular diastolic parameters are consistent with Grade I diastolic dysfunction (impaired relaxation).  2. Right ventricular systolic function is normal. The right ventricular size is normal.  3. The mitral valve is normal in structure. No evidence of mitral valve regurgitation. No evidence of mitral stenosis. Severe mitral annular calcification.  4. The aortic valve is calcified. There is mild calcification of the aortic valve. There is mild thickening of the aortic valve. Aortic valve regurgitation is not visualized. Aortic valve sclerosis is present, with no evidence of  aortic valve stenosis.  5. The inferior vena cava is dilated in size with <50% respiratory variability, suggesting right atrial pressure of 15 mmHg. FINDINGS  Left Ventricle: Left ventricular ejection fraction, by estimation, is 60 to 65%. The left ventricle has normal function. The left ventricle has no regional wall motion abnormalities. The left ventricular internal  cavity size was normal in size. There is  moderate left ventricular hypertrophy. Left ventricular diastolic parameters are consistent with Grade I diastolic dysfunction (impaired relaxation). Right Ventricle: The right ventricular size is normal. No increase in right ventricular wall thickness. Right ventricular systolic function is normal. Left Atrium: Left atrial size was normal in size. Right Atrium: Right atrial size was normal in size. Pericardium: There is no evidence of pericardial effusion. Mitral Valve: The mitral valve is normal in structure. Severe mitral annular calcification. No evidence of mitral valve regurgitation. No evidence of mitral valve stenosis. Tricuspid Valve: The tricuspid valve is normal in structure. Tricuspid valve regurgitation is not demonstrated. No evidence of tricuspid stenosis. Aortic Valve: The aortic valve is calcified. There is mild calcification of the aortic valve. There is mild thickening of the aortic valve. Aortic valve regurgitation is not visualized. Aortic valve sclerosis is present, with no evidence of aortic valve stenosis. Pulmonic Valve: The pulmonic valve was normal in structure. Pulmonic valve regurgitation is trivial. No evidence of pulmonic stenosis. Aorta: The aortic root is normal in size and structure. Venous: The inferior vena cava is dilated in size with less than 50% respiratory variability, suggesting right atrial pressure of 15 mmHg. IAS/Shunts: No atrial level shunt detected by color flow Doppler.  LEFT VENTRICLE PLAX 2D LVIDd:         4.40 cm      Diastology LVIDs:         3.00 cm      LV  e' medial:    3.70 cm/s LV PW:         1.40 cm      LV E/e' medial:  16.9 LV IVS:        1.40 cm      LV e' lateral:   6.09 cm/s LVOT diam:     2.10 cm      LV E/e' lateral: 10.2 LV SV:         77 LV SV Index:   33 LVOT Area:     3.46 cm  LV Volumes (MOD) LV vol d, MOD A2C: 88.8 ml LV vol d, MOD A4C: 123.0 ml LV vol s, MOD A2C: 43.1 ml LV vol s, MOD A4C: 52.3 ml LV SV MOD A2C:     45.7 ml LV SV MOD A4C:     123.0 ml LV SV MOD BP:      64.6 ml RIGHT VENTRICLE             IVC RV S prime:     14.60 cm/s  IVC diam: 3.25 cm TAPSE (M-mode): 2.2 cm LEFT ATRIUM             Index        RIGHT ATRIUM           Index LA diam:        4.00 cm 1.74 cm/m   RA Area:     17.90 cm LA Vol (A2C):   55.5 ml 24.10 ml/m  RA Volume:   46.10 ml  20.02 ml/m LA Vol (A4C):   70.5 ml 30.61 ml/m LA Biplane Vol: 64.8 ml 28.14 ml/m  AORTIC VALVE LVOT Vmax:   127.00 cm/s LVOT Vmean:  77.800 cm/s LVOT VTI:    0.221 m  AORTA Ao Root diam: 3.30 cm Ao Asc diam:  3.80 cm MITRAL VALVE MV Area (PHT): 2.76 cm     SHUNTS MV Decel Time: 275 msec     Systemic VTI:  0.22 m MV E velocity: 62.40 cm/s   Systemic Diam: 2.10 cm MV A velocity: 115.00 cm/s MV E/A ratio:  0.54 Candee Furbish MD Electronically signed by Candee Furbish MD Signature Date/Time: 09/21/2022/2:06:28 PM    Final    DG Chest Portable 1 View  Result Date: 09/20/2022 CLINICAL DATA:  Dyspnea EXAM: PORTABLE CHEST 1 VIEW COMPARISON:  08/25/2022 FINDINGS: There is moderate interstitial pulmonary infiltrate diffusely throughout the right lung and within the left perihilar region which may represent atypical infection or asymmetric pulmonary edema. No pneumothorax or pleural effusion. Apparent cardiomegaly and superior mediastinal widening likely relates to semi-erect positioning. No acute bone abnormality. IMPRESSION: 1. Asymmetric pulmonary infiltrate, asymmetric pulmonary edema versus atypical infection. 2. Apparent cardiomegaly and superior mediastinal widening, possibly related to patient  positioning. This could be confirmed with a standard two view chest radiograph. Electronically Signed   By: Fidela Salisbury M.D.   On: 09/20/2022 19:46     Assessment and Plan:   Elevated Troponin  Chest pain  - Patient presented on 2/15 after she was found with altered mental status/unresponsive by her family. She found to have hypoxic and hypercarbic respiratory failure and is now being treated for strep pneumonia  - Patient reports that earlier that day, she was walking around without wearing her supplemental oxygen. She noticed midsternal chest discomfort, left arm, and jaw pain. Did improve somewhat when supplemental oxygen was replaced  - hsTn was 99>345 yesterday. Up to 802 today  - Echocardiogram showed EF 60-65%, no regional wall motion abnormalities, grade I diastolic dysfunction  - While it is possible that trop elevation is demand ischemia in the setting of pneumonia, hypoxia. However, patient does have risk factors for CAD (obesity, HTN, family history with father having MI in his 30s).   - If patient does not have recurrent angina this admission, cnn do an outpatient nuclear stress test  - Start PO plavix   Suspected OSA  - Patient needs an outpatient sleep study - She now does have insurance, is willing to get study completed   HTN  - Resume home metoprolol when taking PO medications  - Resume home losartan when taking PO medications and when renal function improves    Risk Assessment/Risk Scores:    For questions or updates, please contact [place] Please consult [email or web address] for contact info under    Signed, Margie Billet, PA-C  09/21/2022 2:55 PM  Patient seen, examined. Available data reviewed. Agree with findings, assessment, and plan as outlined by Vikki Ports, PA-C.  The patient is independently interviewed and examined.  Her husband, daughter, and son are at the bedside.  She is evaluated in the medical ICU.  The patient is clinically  improved, mentating well now, on oxygen per nasal cannula in no respiratory distress.  She is awake and alert, seems oriented to all spheres.  HEENT is normal, JVP is normal, carotid upstrokes normal without bruits, lungs are diminished in the bases but otherwise clear, heart is regular rate and rhythm with no murmur or gallop, abdomen is soft, obese, and nontender, extremities have no pretibial or ankle edema, skin is warm and dry with no rash.  EKG shows sinus rhythm with LVH and repolarization abnormality.  High-sensitivity troponins are 345, 802, and 870.  The patient describes pretty typical symptoms of angina about 48 hours ago when she experienced substernal discomfort radiating to the jaw and upper arms, clinical picture c/w NSTEMI.  The episode was brief and appears to be  related to having her oxygen off and possibly oxygen desaturation.  She subsequently went on to develop hypercapnic respiratory failure with a pH of 7.07 and pCO2 of 93.  She was treated with positive pressure ventilation and subsequently improved.  She has AKI with creatinine slightly improved now at 1.90.  The patient's echo is reviewed and is reassuring that it demonstrates normal LVEF and no regional wall motion abnormalities.  As she is pain-free and has significant comorbid medical conditions with acute on chronic hypoxic and hypercarbic respiratory failure, strep pneumonia, and AKI, I would favor medical therapy and noninvasive risk stratification.  Recommend addition of clopidogrel 300 mg x 1, then 75 mg daily.  She has not been able to tolerate low-dose aspirin in the past due to fairly marked GI upset.  Will add a high intensity statin drug and start back her beta-blocker.  She is advised to report to staff if she has any recurrent angina.  Sherren Mocha, M.D. 09/21/2022 5:58 PM

## 2022-09-22 DIAGNOSIS — J9622 Acute and chronic respiratory failure with hypercapnia: Secondary | ICD-10-CM | POA: Diagnosis not present

## 2022-09-22 DIAGNOSIS — I214 Non-ST elevation (NSTEMI) myocardial infarction: Secondary | ICD-10-CM | POA: Diagnosis not present

## 2022-09-22 LAB — GLUCOSE, CAPILLARY
Glucose-Capillary: 105 mg/dL — ABNORMAL HIGH (ref 70–99)
Glucose-Capillary: 109 mg/dL — ABNORMAL HIGH (ref 70–99)
Glucose-Capillary: 114 mg/dL — ABNORMAL HIGH (ref 70–99)
Glucose-Capillary: 154 mg/dL — ABNORMAL HIGH (ref 70–99)
Glucose-Capillary: 89 mg/dL (ref 70–99)
Glucose-Capillary: 91 mg/dL (ref 70–99)

## 2022-09-22 LAB — CBC
HCT: 42.4 % (ref 36.0–46.0)
Hemoglobin: 13.7 g/dL (ref 12.0–15.0)
MCH: 29.5 pg (ref 26.0–34.0)
MCHC: 32.3 g/dL (ref 30.0–36.0)
MCV: 91.4 fL (ref 80.0–100.0)
Platelets: 189 10*3/uL (ref 150–400)
RBC: 4.64 MIL/uL (ref 3.87–5.11)
RDW: 13.5 % (ref 11.5–15.5)
WBC: 6.8 10*3/uL (ref 4.0–10.5)
nRBC: 0 % (ref 0.0–0.2)

## 2022-09-22 LAB — RENAL FUNCTION PANEL
Albumin: 3.2 g/dL — ABNORMAL LOW (ref 3.5–5.0)
Anion gap: 7 (ref 5–15)
BUN: 39 mg/dL — ABNORMAL HIGH (ref 8–23)
CO2: 30 mmol/L (ref 22–32)
Calcium: 8.9 mg/dL (ref 8.9–10.3)
Chloride: 101 mmol/L (ref 98–111)
Creatinine, Ser: 1.46 mg/dL — ABNORMAL HIGH (ref 0.44–1.00)
GFR, Estimated: 40 mL/min — ABNORMAL LOW (ref >60)
Glucose, Bld: 117 mg/dL — ABNORMAL HIGH (ref 70–99)
Phosphorus: 1.8 mg/dL — ABNORMAL LOW (ref 2.5–4.6)
Potassium: 4 mmol/L (ref 3.5–5.1)
Sodium: 138 mmol/L (ref 135–145)

## 2022-09-22 LAB — TROPONIN I (HIGH SENSITIVITY): Troponin I (High Sensitivity): 727 ng/L (ref <18)

## 2022-09-22 LAB — MAGNESIUM: Magnesium: 2.3 mg/dL (ref 1.7–2.4)

## 2022-09-22 MED ORDER — HYDRALAZINE HCL 20 MG/ML IJ SOLN
10.0000 mg | Freq: Four times a day (QID) | INTRAMUSCULAR | Status: DC | PRN
  Administered 2022-09-22 – 2022-09-23 (×4): 10 mg via INTRAVENOUS
  Filled 2022-09-22 (×4): qty 1

## 2022-09-22 MED ORDER — SODIUM PHOSPHATES 45 MMOLE/15ML IV SOLN
30.0000 mmol | Freq: Once | INTRAVENOUS | Status: AC
  Administered 2022-09-22: 30 mmol via INTRAVENOUS
  Filled 2022-09-22 (×2): qty 10

## 2022-09-22 MED ORDER — HYDRALAZINE HCL 10 MG PO TABS
10.0000 mg | ORAL_TABLET | Freq: Four times a day (QID) | ORAL | Status: DC
  Administered 2022-09-22 – 2022-09-24 (×10): 10 mg via ORAL
  Filled 2022-09-22 (×10): qty 1

## 2022-09-22 NOTE — Progress Notes (Incomplete)
Attending note: I have seen and examined the patient. History, labs and imaging reviewed.  67 year old with H/O COPD, HTN, remote trach after flu infection 10 years ago, remote DVTs admitted with hypoxic, hypercarbic resp failure after failing treatment for PNA as outpatient.  Stable, Refused to bipap overnight  Blood pressure (!) 152/88, pulse 77, temperature 99 F (37.2 C), temperature source Oral, resp. rate 19, weight 122.6 kg, SpO2 99 %. Gen:      No acute distress HEENT:  EOMI, sclera anicteric Neck:     No masses; no thyromegaly Lungs:    Clear to auscultation bilaterally; normal respiratory effort*** CV:         Regular rate and rhythm; no murmurs Abd:      + bowel sounds; soft, non-tender; no palpable masses, no distension Ext:    No edema; adequate peripheral perfusion Skin:      Warm and dry; no rash Neuro: alert and oriented x 3 Psych: normal mood and affect   Labs/Imaging personally reviewed, significant for BUN/Cr 39/1.46   Assessment/plan: Acute on chronic hypoxic, hypercarbic respiratory failure Streptococcus pneumonia Bipap as needed Continue rocephin.  DC azithromycin  Elevated troponin likely demand ischemia, HTN Cardiology following. Will need  AKI Improving  Stable for transfer to floor.   The patient is critically ill with multiple organ systems failure and requires high complexity decision making for assessment and support, frequent evaluation and titration of therapies, application of advanced monitoring technologies and extensive interpretation of multiple databases.  Critical care time - 35 mins. This represents my time independent of the NPs time taking care of the pt.  Marshell Garfinkel MD [facility] 09/22/2022, 9:10 AM

## 2022-09-22 NOTE — Progress Notes (Signed)
NAME:  [name], MRN:  [id number], DOB:  01/03/1956, LOS: 2 ADMISSION DATE:  09/20/2022, CONSULTATION DATE:  09/22/22 REFERRING MD:  Ronnald Nian, CHIEF COMPLAINT:  respiratory failure   History of Present Illness:  [name] is a 67 y.o. F with PMH of HTN and COPD (after H1N1 flu, was intubated with trach) on 3L home O2, remote DVT's who was diagnosed with pneumonia two weeks ago and completed a course of Levaquin.  She felt mostly improved, but still had a nagging cough without fevers.  Today her family found her mostly unresponsive  in bed with her nasal cannula off and called EMS.  She did complain of some chest pain yesterday, but says this was mostly related to coughing and feels improved.  No recent travel or ill contacts, never smoker.   In the ED ABG showed hypercarbic respiratory failure with ABG 7.07/92/64/27, CXR with pulmonary edema and L-sided infiltrate.  Labs significant for creatinine of 2.3, baseline 0.78, BNP 365, trop 99->345, WBC 8.9.  She was treated with Azithromycin and Ceftriaxone and her ABG and mentation improved slightly with bipap, so PCCM consulted for admission  Pertinent  Medical History   has a past medical history of DVT (deep venous thrombosis) ([place]), H1N1 influenza, Hypertension, Morbid obesity ([place]), and Seasonal depression ([place]).   Significant Hospital Events: Including procedures, antibiotic start and stop dates in addition to other pertinent events   2/15 admit to PCCM on bipap 2/15 CXR  asymmetrical pulmonary infiltrate R>L and L perihilar region, asymmetrical pulmonary edema vs atypical infection Cardiomegaly  2/16 Urine strep antigen +; D/C azithromycin, continue on ceftriaxone 2/16 Troponin 802; TTE with EF 60-65% with normal LV function. EKG NSR with prolonged Qtc; Cardiology consulted Demand ischemia. CAD risk factors, outpatient referral for stress testing   Interim History / Subjective:  Cardiology consulted for elevated troponins; thought to be  2/2 demand ischemia; CTA if anginal symptoms; now on metoprolol, hydralazine, and plavix.   Patient declined BiPAP overnight. No chest pain this am, tolerating PO intake  Objective   Blood pressure (!) 189/101, pulse 70, temperature 98.2 F (36.8 C), temperature source Oral, resp. rate 16, height 5' 7"$  (1.702 m), weight 122.6 kg, SpO2 95 %.    Intake/Output Summary (Last 24 hours) at 09/22/2022 1239 Last data filed at 09/22/2022 0800 Gross per 24 hour  Intake 340 ml  Output 800 ml  Net -460 ml   Filed Weights   09/21/22 0258 09/22/22 0500  Weight: 123.2 kg 122.6 kg    General:  overweight F, ill-appearing on bipap in no acute distress HEENT: MM pink/moist, sclera anicteric Neuro: awake, A&Ox3 answering questions appropriately, no somnolence, following commands CV: s1s2 rrr, no m/r/g PULM: no increased WOB, speaking in full sentences, good air exchange bilaterally without wheezing or rhonchi GI: soft, obese Extremities: warm/dry, no edema  Skin: no rashes or lesions  Significant labs Cr 1.46 Troponin 802-870  Resolved Hospital Problem list   2/16 Acute hypoxic hypercarbic respiratory failure  Assessment & Plan:   Acute on chronic hypoxic and hypercarbic respiratory failure, resolved 2/2 Strep pneumonia Baseline 3L O2, Patient without COPD on PFT 2014 or CT scan ?[facility] Hx of O2 dependence since ARDS for H1N1 CXR concerning for atypical PNA vs pulmonary edema, now Strep Ag positive Stable, will place transfer orders today -Continue on CTX -ambulatory referral to pulmonology -Will need sleep study  Elevated troponin Chest pain, resolved Denies anginal pain. NSR on telemetry and euvolemic on exam Outpatient stress test  per cardiology -Monitor urine output and telemetry -Continue on Plavix given ASA allergy  AKI Cr improving, still not at baseline Baseline <1. Likely pre-renal 2/2 low po intake in setting of pNA Euvolemic on exam today -monitor UOP, BMP and  electrolytes and avoid nephrotoxins  HTN Holding home doses of metoprolol as low HR  and losartan in setting of AKI Now on hydralazine 10 mg q6HR  Dispo: [facility] hospitalist service will see patient in the AM  Best Practice (right click and "Reselect all SmartList Selections" daily)   Diet/type: Heart healthy DVT prophylaxis: prophylactic heparin  GI prophylaxis: N/A Lines: N/A Foley:  N/A Code Status:  full code  Labs   CBC: Recent Labs  Lab 09/20/22 1857 09/20/22 1940 09/20/22 2201 09/21/22 0221 09/21/22 0844 09/21/22 1141 09/22/22 0647  WBC 8.9  --   --  7.4  --   --  6.8  NEUTROABS 7.1  --   --   --   --   --   --   HGB 14.8   < > 16.0* 14.7 13.9 13.6 13.7  HCT 48.2*   < > 47.0* 46.4* 41.0 40.0 42.4  MCV 97.6  --   --  94.1  --   --  91.4  PLT 240  --   --  197  --   --  189   < > = values in this interval not displayed.    Basic Metabolic Panel: Recent Labs  Lab 09/20/22 1857 09/20/22 1940 09/20/22 2201 09/21/22 0221 09/21/22 0844 09/21/22 1141 09/22/22 0647  NA 141 143  141 142 140 140 141 138  K 4.5 4.1  4.1 4.2 4.6 5.0 4.8 4.0  CL 103 102  --  102  --   --  101  CO2 25  --   --  27  --   --  30  GLUCOSE 166* 165*  --  171*  --   --  117*  BUN 21 25*  --  24*  --   --  39*  CREATININE 2.31* 2.10*  --  1.90*  --   --  1.46*  CALCIUM 8.7*  --   --  8.7*  --   --  8.9  MG  --   --   --  2.5*  --   --  2.3  PHOS  --   --   --  3.1  --   --  1.8*   GFR: Estimated Creatinine Clearance: 52.2 mL/min (A) (by C-G formula based on SCr of 1.46 mg/dL (H)). Recent Labs  Lab 09/20/22 1857 09/21/22 0221 09/22/22 0647  WBC 8.9 7.4 6.8    Liver Function Tests: Recent Labs  Lab 09/20/22 1857 09/22/22 0647  AST 86*  --   ALT 50*  --   ALKPHOS 67  --   BILITOT 0.9  --   PROT 6.6  --   ALBUMIN 3.5 3.2*   Recent Labs  Lab 09/20/22 1857  LIPASE 32   Recent Labs  Lab 09/20/22 1927  AMMONIA 42*    ABG    Component Value Date/Time   PHART  7.296 (L) 09/21/2022 1141   PCO2ART 63.3 (H) 09/21/2022 1141   PO2ART 113 (H) 09/21/2022 1141   HCO3 30.8 (H) 09/21/2022 1141   TCO2 33 (H) 09/21/2022 1141   ACIDBASEDEF 5.0 (H) 09/20/2022 2201   O2SAT 98 09/21/2022 1141     Coagulation Profile: No results for input(s): "INR", "PROTIME" in the last  168 hours.  Cardiac Enzymes: No results for input(s): "CKTOTAL", "CKMB", "CKMBINDEX", "TROPONINI" in the last 168 hours.  HbA1C: No results found for: "HGBA1C"  CBG: Recent Labs  Lab 09/21/22 2001 09/21/22 2339 09/22/22 0319 09/22/22 0800 09/22/22 1103  GLUCAP 105* 112* 114* 154* 91    Review of Systems:   Please see the history of present illness. All other systems reviewed and are negative    Past Medical History:  She,  has a past medical history of DVT (deep venous thrombosis) ([place]), H1N1 influenza, Hypertension, Morbid obesity ([place]), and Seasonal depression ([place]).   Surgical History:   Past Surgical History:  Procedure Laterality Date   CYST EXCISION  1980   Spinal cyst   KNEE ARTHROSCOPY     bilateral   TONSILLECTOMY  1980     Social History:   reports that she has never smoked. She has never used smokeless tobacco. She reports that she does not drink alcohol and does not use drugs.   Family History:  Her family history includes Arthritis in her maternal grandmother and mother; Diabetes in her father; Heart disease in her father; Hypertension in her brother and mother.   Allergies Allergies  Allergen Reactions   Aspirin Nausea And Vomiting   Sulfa Antibiotics Nausea Only     Home Medications  Prior to Admission medications   Medication Sig Start Date End Date Taking? Authorizing Provider  losartan (COZAAR) 25 MG tablet Take 25 mg by mouth at bedtime.    [provider]  metoprolol succinate (TOPROL-XL) 100 MG 24 hr tablet Take 100 mg by mouth in the morning and at bedtime. Take with or immediately following a meal.    [provider]      Critical care time: 30 minutes    Romana Juniper, MD [facility] Internal Medicine Program - PGY-1 09/22/2022, 12:39 PM  See Amion for pager If no response to pager , please call [phone] until 7pm After 7:00 pm call Elink  [phone]?[place]

## 2022-09-22 NOTE — Progress Notes (Signed)
Progress Note  Patient Name: [name] Date of Encounter: 09/22/2022  Primary Cardiologist: None   Subjective   Pt reports she is feeling much better. She has not had chest pain.  Inpatient Medications    Scheduled Meds:  Chlorhexidine Gluconate Cloth  6 each Topical Q0600   clopidogrel  75 mg Oral Daily   heparin  5,000 Units Subcutaneous Q8H   hydrALAZINE  10 mg Oral Q6H   insulin aspart  1-3 Units Subcutaneous Q4H   metoprolol succinate  25 mg Oral Daily   mouth rinse  15 mL Mouth Rinse 4 times per day   rosuvastatin  20 mg Oral Daily   Continuous Infusions:  cefTRIAXone (ROCEPHIN)  IV Stopped (09/21/22 2147)   sodium phosphate 30 mmol in dextrose 5 % 250 mL infusion     PRN Meds: docusate sodium, hydrALAZINE, mouth rinse, polyethylene glycol   Vital Signs    Vitals:   09/22/22 0800 09/22/22 0801 09/22/22 0900 09/22/22 1000  BP: (!) 152/88 (!) 152/88 (!) 168/101 (!) 189/121  Pulse: 72 77 64 68  Resp: 20 19 16 15  $ Temp:  99 F (37.2 C)    TempSrc:  Oral    SpO2: 97% 99% 99% 98%  Weight:      Height:    5' 7"$  (1.702 m)    Intake/Output Summary (Last 24 hours) at 09/22/2022 1040 Last data filed at 09/22/2022 0800 Gross per 24 hour  Intake 340 ml  Output 800 ml  Net -460 ml   Filed Weights   09/21/22 0258 09/22/22 0500  Weight: 123.2 kg 122.6 kg    Telemetry    Cleared on move to 6N  ECG    Sinus rhythm, prolonged QTc - Personally Reviewed  Physical Exam  BP (!) 189/121   Pulse 68   Temp 99 F (37.2 C) (Oral)   Resp 15   Ht 5' 7"$  (1.702 m)   Wt 122.6 kg   SpO2 98%   BMI 42.33 kg/m    GEN: No acute distress.   Neck: No JVD Cardiac: RRR, no murmurs, rubs, or gallops.  Respiratory: Clear to auscultation bilaterally. GI: Soft, nontender, non-distended  MS: No edema; No deformity. Neuro:  Nonfocal  Psych: Normal affect   Labs    Chemistry Recent Labs  Lab 09/20/22 1857 09/20/22 1940 09/20/22 2201 09/21/22 0221 09/21/22 0844  09/21/22 1141 09/22/22 0647  NA 141 143  141   < > 140 140 141 138  K 4.5 4.1  4.1   < > 4.6 5.0 4.8 4.0  CL 103 102  --  102  --   --  101  CO2 25  --   --  27  --   --  30  GLUCOSE 166* 165*  --  171*  --   --  117*  BUN 21 25*  --  24*  --   --  39*  CREATININE 2.31* 2.10*  --  1.90*  --   --  1.46*  CALCIUM 8.7*  --   --  8.7*  --   --  8.9  PROT 6.6  --   --   --   --   --   --   ALBUMIN 3.5  --   --   --   --   --  3.2*  AST 86*  --   --   --   --   --   --   ALT 50*  --   --   --   --   --   --  ALKPHOS 67  --   --   --   --   --   --   BILITOT 0.9  --   --   --   --   --   --   GFRNONAA 23*  --   --  29*  --   --  40*  ANIONGAP 13  --   --  11  --   --  7   < > = values in this interval not displayed.     Hematology Recent Labs  Lab 09/20/22 1857 09/20/22 1940 09/21/22 0221 09/21/22 0844 09/21/22 1141 09/22/22 0647  WBC 8.9  --  7.4  --   --  6.8  RBC 4.94  --  4.93  --   --  4.64  HGB 14.8   < > 14.7 13.9 13.6 13.7  HCT 48.2*   < > 46.4* 41.0 40.0 42.4  MCV 97.6  --  94.1  --   --  91.4  MCH 30.0  --  29.8  --   --  29.5  MCHC 30.7  --  31.7  --   --  32.3  RDW 13.8  --  13.7  --   --  13.5  PLT 240  --  197  --   --  189   < > = values in this interval not displayed.    Cardiac EnzymesNo results for input(s): "TROPONINI" in the last 168 hours. No results for input(s): "TROPIPOC" in the last 168 hours.   BNP Recent Labs  Lab 09/20/22 1857  BNP 365.1*     DDimer No results for input(s): "DDIMER" in the last 168 hours.   Radiology    ECHOCARDIOGRAM COMPLETE  Result Date: 09/21/2022    ECHOCARDIOGRAM REPORT   Patient Name:   [name]  Date of Exam: 09/21/2022 Medical Rec #:  [id number]  Height:       67.0 in Accession #:    [id number] Weight:       271.6 lb Date of Birth:  Aug 22, 1955 BSA:          2.303 m Patient Age:    67 years   BP:           144/83 mmHg Patient Gender: F          HR:           67 bpm. Exam Location:  Inpatient Procedure: 2D Echo,  3D Echo, Cardiac Doppler and Color Doppler Indications:    Acute respiratory distress  History:        Patient has prior history of Echocardiogram examinations, most                 recent 04/04/2020. COPD, Signs/Symptoms:Shortness of Breath and                 Dyspnea; Risk Factors:Hypertension. Pulmonary embolus. Pulmonary                 edema.  Sonographer:    Roseanna Rainbow RDCS Referring Phys: [id number] Francesca Jewett  Sonographer Comments: Patient is obese. Image acquisition challenging due to patient body habitus. IMPRESSIONS  1. Left ventricular ejection fraction, by estimation, is 60 to 65%. The left ventricle has normal function. The left ventricle has no regional wall motion abnormalities. There is moderate left ventricular hypertrophy. Left ventricular diastolic parameters are consistent with Grade I diastolic dysfunction (impaired relaxation).  2. Right ventricular systolic function is normal. The right ventricular  size is normal.  3. The mitral valve is normal in structure. No evidence of mitral valve regurgitation. No evidence of mitral stenosis. Severe mitral annular calcification.  4. The aortic valve is calcified. There is mild calcification of the aortic valve. There is mild thickening of the aortic valve. Aortic valve regurgitation is not visualized. Aortic valve sclerosis is present, with no evidence of aortic valve stenosis.  5. The inferior vena cava is dilated in size with <50% respiratory variability, suggesting right atrial pressure of 15 mmHg. FINDINGS  Left Ventricle: Left ventricular ejection fraction, by estimation, is 60 to 65%. The left ventricle has normal function. The left ventricle has no regional wall motion abnormalities. The left ventricular internal cavity size was normal in size. There is  moderate left ventricular hypertrophy. Left ventricular diastolic parameters are consistent with Grade I diastolic dysfunction (impaired relaxation). Right Ventricle: The right ventricular size  is normal. No increase in right ventricular wall thickness. Right ventricular systolic function is normal. Left Atrium: Left atrial size was normal in size. Right Atrium: Right atrial size was normal in size. Pericardium: There is no evidence of pericardial effusion. Mitral Valve: The mitral valve is normal in structure. Severe mitral annular calcification. No evidence of mitral valve regurgitation. No evidence of mitral valve stenosis. Tricuspid Valve: The tricuspid valve is normal in structure. Tricuspid valve regurgitation is not demonstrated. No evidence of tricuspid stenosis. Aortic Valve: The aortic valve is calcified. There is mild calcification of the aortic valve. There is mild thickening of the aortic valve. Aortic valve regurgitation is not visualized. Aortic valve sclerosis is present, with no evidence of aortic valve stenosis. Pulmonic Valve: The pulmonic valve was normal in structure. Pulmonic valve regurgitation is trivial. No evidence of pulmonic stenosis. Aorta: The aortic root is normal in size and structure. Venous: The inferior vena cava is dilated in size with less than 50% respiratory variability, suggesting right atrial pressure of 15 mmHg. IAS/Shunts: No atrial level shunt detected by color flow Doppler.  LEFT VENTRICLE PLAX 2D LVIDd:         4.40 cm      Diastology LVIDs:         3.00 cm      LV e' medial:    3.70 cm/s LV PW:         1.40 cm      LV E/e' medial:  16.9 LV IVS:        1.40 cm      LV e' lateral:   6.09 cm/s LVOT diam:     2.10 cm      LV E/e' lateral: 10.2 LV SV:         77 LV SV Index:   33 LVOT Area:     3.46 cm  LV Volumes (MOD) LV vol d, MOD A2C: 88.8 ml LV vol d, MOD A4C: 123.0 ml LV vol s, MOD A2C: 43.1 ml LV vol s, MOD A4C: 52.3 ml LV SV MOD A2C:     45.7 ml LV SV MOD A4C:     123.0 ml LV SV MOD BP:      64.6 ml RIGHT VENTRICLE             IVC RV S prime:     14.60 cm/s  IVC diam: 3.25 cm TAPSE (M-mode): 2.2 cm LEFT ATRIUM             Index        RIGHT ATRIUM  Index LA diam:        4.00 cm 1.74 cm/m   RA Area:     17.90 cm LA Vol (A2C):   55.5 ml 24.10 ml/m  RA Volume:   46.10 ml  20.02 ml/m LA Vol (A4C):   70.5 ml 30.61 ml/m LA Biplane Vol: 64.8 ml 28.14 ml/m  AORTIC VALVE LVOT Vmax:   127.00 cm/s LVOT Vmean:  77.800 cm/s LVOT VTI:    0.221 m  AORTA Ao Root diam: 3.30 cm Ao Asc diam:  3.80 cm MITRAL VALVE MV Area (PHT): 2.76 cm     SHUNTS MV Decel Time: 275 msec     Systemic VTI:  0.22 m MV E velocity: 62.40 cm/s   Systemic Diam: 2.10 cm MV A velocity: 115.00 cm/s MV E/A ratio:  0.54 Candee Furbish MD Electronically signed by Candee Furbish MD Signature Date/Time: 09/21/2022/2:06:28 PM    Final    DG Chest Portable 1 View  Result Date: 09/20/2022 CLINICAL DATA:  Dyspnea EXAM: PORTABLE CHEST 1 VIEW COMPARISON:  08/25/2022 FINDINGS: There is moderate interstitial pulmonary infiltrate diffusely throughout the right lung and within the left perihilar region which may represent atypical infection or asymmetric pulmonary edema. No pneumothorax or pleural effusion. Apparent cardiomegaly and superior mediastinal widening likely relates to semi-erect positioning. No acute bone abnormality. IMPRESSION: 1. Asymmetric pulmonary infiltrate, asymmetric pulmonary edema versus atypical infection. 2. Apparent cardiomegaly and superior mediastinal widening, possibly related to patient positioning. This could be confirmed with a standard two view chest radiograph. Electronically Signed   By: Fidela Salisbury M.D.   On: 09/20/2022 19:46    Cardiac Studies   TTE 03/2020: EF 50-55% with possible anteroapical WMA  Stress test with nuclear imaging 04/06/20: Reduced activity at the anterior and apical wall, suspected scar.  Patient Profile     67 y.o. female with a hx of HTN, COPD on home O2, depression, history of DVT who is being seen 09/21/2022 for the evaluation of elevated troponin at the request of Dr. Vaughan Browner.   Assessment & Plan    # Elevated troponin: suspect demand  ischemia. Would arrange outpatient follow-up and repeat stress testing.  - continue crestor, metoprolol, clopidogrel # Hypertension: with creatinine improving rapidly, would resume losartan # aspirin intolerance # Unresponsive episode precipitating admission # Acute hypoxic, hypercarbic respiratory failure, strep pneumonia   For questions or updates, please contact [facility] Please consult [email or web address] for contact info under Cardiology/STEMI.      Signed, Melida Quitter, MD 09/22/2022, 10:40 AM

## 2022-09-22 NOTE — Progress Notes (Signed)
Pt refused BiPAP @this$  time. Aware @bedside$ , will offer again. Explained reasoning & possible benefit.

## 2022-09-23 DIAGNOSIS — I214 Non-ST elevation (NSTEMI) myocardial infarction: Secondary | ICD-10-CM | POA: Diagnosis not present

## 2022-09-23 DIAGNOSIS — J189 Pneumonia, unspecified organism: Secondary | ICD-10-CM | POA: Diagnosis not present

## 2022-09-23 DIAGNOSIS — I10 Essential (primary) hypertension: Secondary | ICD-10-CM | POA: Diagnosis not present

## 2022-09-23 LAB — GLUCOSE, CAPILLARY
Glucose-Capillary: 126 mg/dL — ABNORMAL HIGH (ref 70–99)
Glucose-Capillary: 111 mg/dL — ABNORMAL HIGH (ref 70–99)

## 2022-09-23 MED ORDER — CARVEDILOL 12.5 MG PO TABS
12.5000 mg | ORAL_TABLET | Freq: Two times a day (BID) | ORAL | Status: DC
  Administered 2022-09-23 – 2022-09-24 (×3): 12.5 mg via ORAL
  Filled 2022-09-23 (×3): qty 1

## 2022-09-23 MED ORDER — SERTRALINE HCL 100 MG PO TABS
100.0000 mg | ORAL_TABLET | Freq: Every day | ORAL | Status: DC
  Administered 2022-09-23 – 2022-09-24 (×2): 100 mg via ORAL
  Filled 2022-09-23 (×2): qty 1

## 2022-09-23 MED ORDER — LOSARTAN POTASSIUM 25 MG PO TABS
25.0000 mg | ORAL_TABLET | Freq: Every day | ORAL | Status: DC
  Administered 2022-09-23 – 2022-09-24 (×2): 25 mg via ORAL
  Filled 2022-09-23 (×2): qty 1

## 2022-09-23 NOTE — Progress Notes (Signed)
   09/23/22 0600  Provider Notification  Provider Name/Title Elink  Date Provider Notified 09/23/22  Time Provider Notified [phone]  Method of Notification Call  Notification Reason Other (Comment) (high blood pressure)  Provider response Other (Comment) (order to give PRN hydralazine,verbal order, spoke to provider)  Date of Provider Response 09/23/22  Time of Provider Response [phone]

## 2022-09-23 NOTE — Progress Notes (Signed)
PROGRESS NOTE    [name]  [id number] DOB: 02-23-1956 DOA: 09/20/2022 PCP: Shirline Frees, MD    Brief Narrative:   [name] is a 67 y.o. 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  EMS was activated.  Patient apparently complained of chest pain day prior to admission but thought it was mostly related to coughing.  Denies any recent travel, sick contacts or tobacco use.  2 weeks prior to admission, was diagnosed with pneumonia and completed a course of Levaquin.  She felt it mostly resolved but still had a nagging cough without fever.  In the ED, ABG with hypercarbic respiratory failure with ABG 7.07/92/64/27, chest x-ray with pulmonary edema and left-sided infiltrate.  Labs significant for creatinine of 2.3 with baseline 0.78, BNP 365, troponin 99-> 345, WBC count 8.9.  Respiratory viral panel was negative.  Patient was started on azithromycin and ceftriaxone; BiPAP with improvement of her mentation.  PCCM was consulted and patient was admitted to the critical care service.  Significant Hospital events: 2/15: Admitted to the PCCM service, on BiPAP and antibiotics 2/16: Urine strep antigen came back positive, azithromycin was discontinued, continued on ceftriaxone.  Troponin elevated to 802, cardiology consulted. TTE with EF 60-65% with normal LV function. EKG NSR with prolonged Qtc.  Troponin elevation thought to be due to demand ischemia 2/18: Transferred to the hospitalist service.  Cardiology now signed off   Assessment & Plan:   Acute on chronic hypoxic/hypercarbic respiratory failure: Resolved Acute metabolic encephalopathy, POA: Resolved Streptococcus pneumonia Patient presenting to ED after being found  unresponsive with her nasal cannula off by family.  Recently treated outpatient for pneumonia with Levaquin.  Patient was noted to be acidotic with elevated CO2 of 92 on ABG.  Imaging concerning for left-sided infiltrate.  Patient was started on azithromycin and ceftriaxone.  Strep urinary antigen returned positive and azithromycin was discontinued.  Patient initially requiring BiPAP which has been titrated off. -- Ceftriaxone 2 g IV every 24 hours (Day #4) -- Continue supplemental oxygen, maintain SpO2 greater than 92%, currently oxygenating well on home 3 L nasal cannula -- TOC consulted for for assistance with obtaining a home oxygen concentrator per family request  Elevated troponin likely secondary to type II demand ischemia Patient did complain of chest discomfort day prior to admission, resolved since.  Patient's troponin on admission elevated 99 with peak of 870.  EKG with NSR, no concerning dynamic changes.  TTE with LVEF 60 to 65%, normal LV function, no LV regional wall motion normalities, mild LVH, grade 1 diastolic dysfunction, no aortic stenosis, IVC dilated.  Cardiology was consulted and believes her troponin elevation likely secondary to type II demand ischemia in the setting of infectious process as above.  Cardiology now signed off with recommendation of outpatient follow-up for consideration of outpatient stress test.  Continue Plavix and Crestor.  Acute renal failure Etiology likely secondary to prerenal azotemia in the setting of poor oral intake in the setting of pneumonia. -- Cr 2.31>>1.46 -- Repeat renal function in the a.m.  Essential hypertension Home regimen includes metoprolol succinate 200 mg BID -- Metoprolol changed to carvedilol 12.5 mg p.o. twice daily per cardiology -- Started on losartan 25 mg p.o. daily -- Hydralazine 10 mg p.o. q6h --  Losartan 25 mg p.o. daily  Anxiety/depression: -- Zoloft 100 mg p.o. daily  HLD: Crestor 20 mg p.o. daily  Morbid  obesity Body mass index is 42.33 kg/m.  Suspected OHS/OSA Given body habitus, family reports significant snoring during sleep.  Patient follows with pulmonology, Dr. Annamaria Boots outpatient.  Would recommend outpatient sleep study as patient would likely benefit from home CPAP/BiPAP use.   DVT prophylaxis: heparin injection 5,000 Units Start: 09/21/22 0600 SCDs Start: 09/20/22 2328    Code Status: Full Code Family Communication: Updated family members present at bedside this morning  Disposition Plan:  Level of care: Med-Surg Status is: Inpatient Remains inpatient appropriate because: IV antibiotics    Consultants:  PCCM signed off 2/18 Cardiology signed off 2/18  Procedures:  TTE  Antimicrobials:  Azithromycin 2/15 - 2/15 Ceftriaxone 2/15>>   Subjective: Patient seen examined bedside, resting comfortably.  Lying in bed.  No specific complaints this morning.  Family present at bedside.  Seen by cardiology with adjustments to her antihypertensive regimen and now signed off.  Family requesting oxygen concentrator for home use.  Patient states has been able to ambulate in the room without issue, denies any issues with balance/gait disturbance.  No other specific questions or concerns at this time.  Denies headache, no dizziness, no chest pain, no palpitations, no current shortness of breath, no abdominal pain, no focal weakness, no fatigue, no paresthesias.  No acute events overnight per nursing staff.  Objective: Vitals:   09/23/22 0030 09/23/22 0040 09/23/22 0612 09/23/22 0948  BP: (!) 187/119 (!) 160/110 (!) 184/126 134/87  Pulse: 68  73 87  Resp: 20  20 20  $ Temp: 97.9 F (36.6 C)  98.2 F (36.8 C) 97.6 F (36.4 C)  TempSrc: Oral  Oral Oral  SpO2: 98%  98% 98%  Weight:      Height:        Intake/Output Summary (Last 24 hours) at 09/23/2022 1119 Last data filed at 09/23/2022 0900 Gross per 24 hour  Intake 598.17 ml  Output 900 ml  Net -301.83 ml   Filed Weights    09/21/22 0258 09/22/22 0500  Weight: 123.2 kg 122.6 kg    Examination:  Physical Exam: GEN: NAD, alert and oriented x 3, obese HEENT: NCAT, PERRL, EOMI, sclera clear, MMM PULM: Breath sounds slight diminished bilateral bases, no wheezing/crackles, normal respiratory effort without accessory muscle use, on 3 L nasal cannula which was her baseline CV: RRR w/o M/G/R GI: abd soft, NTND, NABS, no R/G/M MSK: no peripheral edema, muscle strength globally intact 5/5 bilateral upper/lower extremities NEURO: CN II-XII intact, no focal deficits, sensation to light touch intact PSYCH: normal mood/affect Integumentary: dry/intact, no rashes or wounds    Data Reviewed: I have personally reviewed following labs and imaging studies  CBC: Recent Labs  Lab 09/20/22 1857 09/20/22 1940 09/20/22 2201 09/21/22 0221 09/21/22 0844 09/21/22 1141 09/22/22 0647  WBC 8.9  --   --  7.4  --   --  6.8  NEUTROABS 7.1  --   --   --   --   --   --   HGB 14.8   < > 16.0* 14.7 13.9 13.6 13.7  HCT 48.2*   < > 47.0* 46.4* 41.0 40.0 42.4  MCV 97.6  --   --  94.1  --   --  91.4  PLT 240  --   --  197  --   --  189   < > = values in this  interval not displayed.   Basic Metabolic Panel: Recent Labs  Lab 09/20/22 1857 09/20/22 1940 09/20/22 2201 09/21/22 0221 09/21/22 0844 09/21/22 1141 09/22/22 0647  NA 141 143  141 142 140 140 141 138  K 4.5 4.1  4.1 4.2 4.6 5.0 4.8 4.0  CL 103 102  --  102  --   --  101  CO2 25  --   --  27  --   --  30  GLUCOSE 166* 165*  --  171*  --   --  117*  BUN 21 25*  --  24*  --   --  39*  CREATININE 2.31* 2.10*  --  1.90*  --   --  1.46*  CALCIUM 8.7*  --   --  8.7*  --   --  8.9  MG  --   --   --  2.5*  --   --  2.3  PHOS  --   --   --  3.1  --   --  1.8*   GFR: Estimated Creatinine Clearance: 52.2 mL/min (A) (by C-G formula based on SCr of 1.46 mg/dL (H)). Liver Function Tests: Recent Labs  Lab 09/20/22 1857 09/22/22 0647  AST 86*  --   ALT 50*  --    ALKPHOS 67  --   BILITOT 0.9  --   PROT 6.6  --   ALBUMIN 3.5 3.2*   Recent Labs  Lab 09/20/22 1857  LIPASE 32   Recent Labs  Lab 09/20/22 1927  AMMONIA 42*   Coagulation Profile: No results for input(s): "INR", "PROTIME" in the last 168 hours. Cardiac Enzymes: No results for input(s): "CKTOTAL", "CKMB", "CKMBINDEX", "TROPONINI" in the last 168 hours. BNP (last 3 results) No results for input(s): "PROBNP" in the last 8760 hours. HbA1C: No results for input(s): "HGBA1C" in the last 72 hours. CBG: Recent Labs  Lab 09/22/22 1103 09/22/22 1729 09/22/22 1955 09/22/22 2350 09/23/22 0627  GLUCAP 91 89 109* 105* 126*   Lipid Profile: No results for input(s): "CHOL", "HDL", "LDLCALC", "TRIG", "CHOLHDL", "LDLDIRECT" in the last 72 hours. Thyroid Function Tests: No results for input(s): "TSH", "T4TOTAL", "FREET4", "T3FREE", "THYROIDAB" in the last 72 hours. Anemia Panel: No results for input(s): "VITAMINB12", "FOLATE", "FERRITIN", "TIBC", "IRON", "RETICCTPCT" in the last 72 hours. Sepsis Labs: No results for input(s): "PROCALCITON", "LATICACIDVEN" in the last 168 hours.  Recent Results (from the past 240 hour(s))  Resp panel by RT-PCR (RSV, Flu A&B, Covid) Anterior Nasal Swab     Status: None   Collection Time: 09/20/22  7:27 PM   Specimen: Anterior Nasal Swab  Result Value Ref Range Status   SARS Coronavirus 2 by RT PCR NEGATIVE NEGATIVE Final   Influenza A by PCR NEGATIVE NEGATIVE Final   Influenza B by PCR NEGATIVE NEGATIVE Final    Comment: (NOTE) The Xpert Xpress SARS-CoV-2/FLU/RSV plus assay is intended as an aid in the diagnosis of influenza from Nasopharyngeal swab specimens and should not be used as a sole basis for treatment. Nasal washings and aspirates are unacceptable for Xpert Xpress SARS-CoV-2/FLU/RSV testing.  Fact Sheet for Patients: EntrepreneurPulse.com.au  Fact Sheet for Healthcare  Providers: IncredibleEmployment.be  This test is not yet approved or cleared by the [place] FDA and has been authorized for detection and/or diagnosis of SARS-CoV-2 by FDA under an Emergency Use Authorization (EUA). This EUA will remain in effect (meaning this test can be used) for the duration of the COVID-19 declaration under Section 564(b)(1)  of the Act, 21 U.S.C. section 360bbb-3(b)(1), unless the authorization is terminated or revoked.     Resp Syncytial Virus by PCR NEGATIVE NEGATIVE Final    Comment: (NOTE) Fact Sheet for Patients: EntrepreneurPulse.com.au  Fact Sheet for Healthcare Providers: IncredibleEmployment.be  This test is not yet approved or cleared by the [place] FDA and has been authorized for detection and/or diagnosis of SARS-CoV-2 by FDA under an Emergency Use Authorization (EUA). This EUA will remain in effect (meaning this test can be used) for the duration of the COVID-19 declaration under Section 564(b)(1) of the Act, 21 U.S.C. section 360bbb-3(b)(1), unless the authorization is terminated or revoked.  Performed at [facility], [place] [address] [place], [place] [address]   Respiratory (~20 pathogens) panel by PCR     Status: None   Collection Time: 09/21/22  2:06 AM   Specimen: Nasopharyngeal Swab; Respiratory  Result Value Ref Range Status   Adenovirus NOT DETECTED NOT DETECTED Final   Coronavirus 229E NOT DETECTED NOT DETECTED Final    Comment: (NOTE) The Coronavirus on the Respiratory Panel, DOES NOT test for the novel  Coronavirus (2019 nCoV)    Coronavirus HKU1 NOT DETECTED NOT DETECTED Final   Coronavirus NL63 NOT DETECTED NOT DETECTED Final   Coronavirus OC43 NOT DETECTED NOT DETECTED Final   Metapneumovirus NOT DETECTED NOT DETECTED Final   Rhinovirus / Enterovirus NOT DETECTED NOT DETECTED Final   Influenza A NOT DETECTED NOT DETECTED Final   Influenza B NOT DETECTED  NOT DETECTED Final   Parainfluenza Virus 1 NOT DETECTED NOT DETECTED Final   Parainfluenza Virus 2 NOT DETECTED NOT DETECTED Final   Parainfluenza Virus 3 NOT DETECTED NOT DETECTED Final   Parainfluenza Virus 4 NOT DETECTED NOT DETECTED Final   Respiratory Syncytial Virus NOT DETECTED NOT DETECTED Final   Bordetella pertussis NOT DETECTED NOT DETECTED Final   Bordetella Parapertussis NOT DETECTED NOT DETECTED Final   Chlamydophila pneumoniae NOT DETECTED NOT DETECTED Final   Mycoplasma pneumoniae NOT DETECTED NOT DETECTED Final    Comment: Performed at [facility], [place], [place], [place] [address]  MRSA Next Gen by PCR, Nasal     Status: None   Collection Time: 09/21/22  2:06 AM   Specimen: Nasal Mucosa; Nasal Swab  Result Value Ref Range Status   MRSA by PCR Next Gen NOT DETECTED NOT DETECTED Final    Comment: (NOTE) The GeneXpert MRSA Assay (FDA approved for NASAL specimens only), is one component of a comprehensive MRSA colonization surveillance program. It is not intended to diagnose MRSA infection nor to guide or monitor treatment for MRSA infections. Test performance is not FDA approved in patients less than 63 years old. Performed at [facility], [place] [address] [place], [place] [address]          Radiology Studies: ECHOCARDIOGRAM COMPLETE  Result Date: 09/21/2022    ECHOCARDIOGRAM REPORT   Patient Name:   LAKEENA MORAWSKI  Date of Exam: 09/21/2022 Medical Rec #:  [id number]  Height:       67.0 in Accession #:    [id number] Weight:       271.6 lb Date of Birth:  08-May-1956 BSA:          2.303 m Patient Age:    35 years   BP:           144/83 mmHg Patient Gender: F          HR:  67 bpm. Exam Location:  Inpatient Procedure: 2D Echo, 3D Echo, Cardiac Doppler and Color Doppler Indications:    Acute respiratory distress  History:        Patient has prior history of Echocardiogram examinations, most                 recent 04/04/2020. COPD,  Signs/Symptoms:Shortness of Breath and                 Dyspnea; Risk Factors:Hypertension. Pulmonary embolus. Pulmonary                 edema.  Sonographer:    Roseanna Rainbow RDCS Referring Phys: [id number] Francesca Jewett  Sonographer Comments: Patient is obese. Image acquisition challenging due to patient body habitus. IMPRESSIONS  1. Left ventricular ejection fraction, by estimation, is 60 to 65%. The left ventricle has normal function. The left ventricle has no regional wall motion abnormalities. There is moderate left ventricular hypertrophy. Left ventricular diastolic parameters are consistent with Grade I diastolic dysfunction (impaired relaxation).  2. Right ventricular systolic function is normal. The right ventricular size is normal.  3. The mitral valve is normal in structure. No evidence of mitral valve regurgitation. No evidence of mitral stenosis. Severe mitral annular calcification.  4. The aortic valve is calcified. There is mild calcification of the aortic valve. There is mild thickening of the aortic valve. Aortic valve regurgitation is not visualized. Aortic valve sclerosis is present, with no evidence of aortic valve stenosis.  5. The inferior vena cava is dilated in size with <50% respiratory variability, suggesting right atrial pressure of 15 mmHg. FINDINGS  Left Ventricle: Left ventricular ejection fraction, by estimation, is 60 to 65%. The left ventricle has normal function. The left ventricle has no regional wall motion abnormalities. The left ventricular internal cavity size was normal in size. There is  moderate left ventricular hypertrophy. Left ventricular diastolic parameters are consistent with Grade I diastolic dysfunction (impaired relaxation). Right Ventricle: The right ventricular size is normal. No increase in right ventricular wall thickness. Right ventricular systolic function is normal. Left Atrium: Left atrial size was normal in size. Right Atrium: Right atrial size was normal in  size. Pericardium: There is no evidence of pericardial effusion. Mitral Valve: The mitral valve is normal in structure. Severe mitral annular calcification. No evidence of mitral valve regurgitation. No evidence of mitral valve stenosis. Tricuspid Valve: The tricuspid valve is normal in structure. Tricuspid valve regurgitation is not demonstrated. No evidence of tricuspid stenosis. Aortic Valve: The aortic valve is calcified. There is mild calcification of the aortic valve. There is mild thickening of the aortic valve. Aortic valve regurgitation is not visualized. Aortic valve sclerosis is present, with no evidence of aortic valve stenosis. Pulmonic Valve: The pulmonic valve was normal in structure. Pulmonic valve regurgitation is trivial. No evidence of pulmonic stenosis. Aorta: The aortic root is normal in size and structure. Venous: The inferior vena cava is dilated in size with less than 50% respiratory variability, suggesting right atrial pressure of 15 mmHg. IAS/Shunts: No atrial level shunt detected by color flow Doppler.  LEFT VENTRICLE PLAX 2D LVIDd:         4.40 cm      Diastology LVIDs:         3.00 cm      LV e' medial:    3.70 cm/s LV PW:         1.40 cm      LV E/e' medial:  16.9  LV IVS:        1.40 cm      LV e' lateral:   6.09 cm/s LVOT diam:     2.10 cm      LV E/e' lateral: 10.2 LV SV:         77 LV SV Index:   33 LVOT Area:     3.46 cm  LV Volumes (MOD) LV vol d, MOD A2C: 88.8 ml LV vol d, MOD A4C: 123.0 ml LV vol s, MOD A2C: 43.1 ml LV vol s, MOD A4C: 52.3 ml LV SV MOD A2C:     45.7 ml LV SV MOD A4C:     123.0 ml LV SV MOD BP:      64.6 ml RIGHT VENTRICLE             IVC RV S prime:     14.60 cm/s  IVC diam: 3.25 cm TAPSE (M-mode): 2.2 cm LEFT ATRIUM             Index        RIGHT ATRIUM           Index LA diam:        4.00 cm 1.74 cm/m   RA Area:     17.90 cm LA Vol (A2C):   55.5 ml 24.10 ml/m  RA Volume:   46.10 ml  20.02 ml/m LA Vol (A4C):   70.5 ml 30.61 ml/m LA Biplane Vol: 64.8 ml  28.14 ml/m  AORTIC VALVE LVOT Vmax:   127.00 cm/s LVOT Vmean:  77.800 cm/s LVOT VTI:    0.221 m  AORTA Ao Root diam: 3.30 cm Ao Asc diam:  3.80 cm MITRAL VALVE MV Area (PHT): 2.76 cm     SHUNTS MV Decel Time: 275 msec     Systemic VTI:  0.22 m MV E velocity: 62.40 cm/s   Systemic Diam: 2.10 cm MV A velocity: 115.00 cm/s MV E/A ratio:  0.54 Candee Furbish MD Electronically signed by Candee Furbish MD Signature Date/Time: 09/21/2022/2:06:28 PM    Final         Scheduled Meds:  carvedilol  12.5 mg Oral BID WC   Chlorhexidine Gluconate Cloth  6 each Topical Q0600   clopidogrel  75 mg Oral Daily   heparin  5,000 Units Subcutaneous Q8H   hydrALAZINE  10 mg Oral Q6H   insulin aspart  1-3 Units Subcutaneous Q4H   losartan  25 mg Oral Daily   mouth rinse  15 mL Mouth Rinse 4 times per day   rosuvastatin  20 mg Oral Daily   Continuous Infusions:  cefTRIAXone (ROCEPHIN)  IV 2 g (09/22/22 2024)     LOS: 3 days    Time spent: 53 minutes spent on chart review, discussion with nursing staff, consultants, updating family and interview/physical exam; more than 50% of that time was spent in counseling and/or coordination of care.    Laban Orourke J [place] DO [facility] Available via Epic secure chat 7am-7pm After these hours, please refer to coverage provider listed on [email or web address] 09/23/2022, 11:19 AM

## 2022-09-23 NOTE — Plan of Care (Signed)

## 2022-09-23 NOTE — Progress Notes (Signed)
   09/23/22 0203  Provider Notification  Provider Name/Title Praveen Manam,MD (message sent to wrong provider)  Date Provider Notified 09/23/22  Time Provider Notified 0203  Method of Notification Page (secure chat)  Notification Reason Other (Comment)  Provider response Other (Comment) (waiting for response)  Date of Provider Response 09/23/22  Time of Provider Response [phone]

## 2022-09-23 NOTE — Progress Notes (Signed)
RT placed patient on CPAP HS. No O2 bleed in needed. Patient tolerating well at this time.

## 2022-09-23 NOTE — Progress Notes (Signed)
Progress Note  Patient Name: [name] Date of Encounter: 09/23/2022  Primary Cardiologist: None   Subjective   Pt reports she is feeling much better. She has not had chest pain.  Inpatient Medications    Scheduled Meds:  carvedilol  12.5 mg Oral BID WC   Chlorhexidine Gluconate Cloth  6 each Topical Q0600   clopidogrel  75 mg Oral Daily   heparin  5,000 Units Subcutaneous Q8H   hydrALAZINE  10 mg Oral Q6H   insulin aspart  1-3 Units Subcutaneous Q4H   losartan  25 mg Oral Daily   mouth rinse  15 mL Mouth Rinse 4 times per day   rosuvastatin  20 mg Oral Daily   Continuous Infusions:  cefTRIAXone (ROCEPHIN)  IV 2 g (09/22/22 2024)   PRN Meds: docusate sodium, hydrALAZINE, mouth rinse, polyethylene glycol   Vital Signs    Vitals:   09/22/22 1953 09/23/22 0030 09/23/22 0040 09/23/22 0612  BP: (!) 172/90 (!) 187/119 (!) 160/110 (!) 184/126  Pulse: 77 68  73  Resp: 17 20  20  $ Temp: 98.1 F (36.7 C) 97.9 F (36.6 C)  98.2 F (36.8 C)  TempSrc: Oral Oral  Oral  SpO2: 100% 98%  98%  Weight:      Height:        Intake/Output Summary (Last 24 hours) at 09/23/2022 0726 Last data filed at 09/23/2022 0600 Gross per 24 hour  Intake 618.17 ml  Output 900 ml  Net -281.83 ml   Filed Weights   09/21/22 0258 09/22/22 0500  Weight: 123.2 kg 122.6 kg    Telemetry    Cleared on move to 6N  ECG    Sinus rhythm, prolonged QTc - Personally Reviewed  Physical Exam  BP (!) 184/126 (BP Location: Left Arm)   Pulse 73   Temp 98.2 F (36.8 C) (Oral)   Resp 20   Ht 5' 7"$  (1.702 m)   Wt 122.6 kg   SpO2 98%   BMI 42.33 kg/m    GEN: No acute distress.   Neck: No JVD Cardiac: RRR, no murmurs, rubs, or gallops.  Respiratory: Clear to auscultation bilaterally. GI: Soft, nontender, non-distended  MS: No edema; No deformity. Neuro:  Nonfocal  Psych: Normal affect   Labs    Chemistry Recent Labs  Lab 09/20/22 1857 09/20/22 1940 09/20/22 2201 09/21/22 0221  09/21/22 0844 09/21/22 1141 09/22/22 0647  NA 141 143  141   < > 140 140 141 138  K 4.5 4.1  4.1   < > 4.6 5.0 4.8 4.0  CL 103 102  --  102  --   --  101  CO2 25  --   --  27  --   --  30  GLUCOSE 166* 165*  --  171*  --   --  117*  BUN 21 25*  --  24*  --   --  39*  CREATININE 2.31* 2.10*  --  1.90*  --   --  1.46*  CALCIUM 8.7*  --   --  8.7*  --   --  8.9  PROT 6.6  --   --   --   --   --   --   ALBUMIN 3.5  --   --   --   --   --  3.2*  AST 86*  --   --   --   --   --   --   ALT 50*  --   --   --   --   --   --  ALKPHOS 67  --   --   --   --   --   --   BILITOT 0.9  --   --   --   --   --   --   GFRNONAA 23*  --   --  29*  --   --  40*  ANIONGAP 13  --   --  11  --   --  7   < > = values in this interval not displayed.     Hematology Recent Labs  Lab 09/20/22 1857 09/20/22 1940 09/21/22 0221 09/21/22 0844 09/21/22 1141 09/22/22 0647  WBC 8.9  --  7.4  --   --  6.8  RBC 4.94  --  4.93  --   --  4.64  HGB 14.8   < > 14.7 13.9 13.6 13.7  HCT 48.2*   < > 46.4* 41.0 40.0 42.4  MCV 97.6  --  94.1  --   --  91.4  MCH 30.0  --  29.8  --   --  29.5  MCHC 30.7  --  31.7  --   --  32.3  RDW 13.8  --  13.7  --   --  13.5  PLT 240  --  197  --   --  189   < > = values in this interval not displayed.    Cardiac EnzymesNo results for input(s): "TROPONINI" in the last 168 hours. No results for input(s): "TROPIPOC" in the last 168 hours.   BNP Recent Labs  Lab 09/20/22 1857  BNP 365.1*     DDimer No results for input(s): "DDIMER" in the last 168 hours.   Radiology    ECHOCARDIOGRAM COMPLETE  Result Date: 09/21/2022    ECHOCARDIOGRAM REPORT   Patient Name:   [name]  Date of Exam: 09/21/2022 Medical Rec #:  [id number]  Height:       67.0 in Accession #:    [id number] Weight:       271.6 lb Date of Birth:  31-Mar-1956 BSA:          2.303 m Patient Age:    58 years   BP:           144/83 mmHg Patient Gender: F          HR:           67 bpm. Exam Location:  Inpatient  Procedure: 2D Echo, 3D Echo, Cardiac Doppler and Color Doppler Indications:    Acute respiratory distress  History:        Patient has prior history of Echocardiogram examinations, most                 recent 04/04/2020. COPD, Signs/Symptoms:Shortness of Breath and                 Dyspnea; Risk Factors:Hypertension. Pulmonary embolus. Pulmonary                 edema.  Sonographer:    Roseanna Rainbow RDCS Referring Phys: [id number] Francesca Jewett  Sonographer Comments: Patient is obese. Image acquisition challenging due to patient body habitus. IMPRESSIONS  1. Left ventricular ejection fraction, by estimation, is 60 to 65%. The left ventricle has normal function. The left ventricle has no regional wall motion abnormalities. There is moderate left ventricular hypertrophy. Left ventricular diastolic parameters are consistent with Grade I diastolic dysfunction (impaired relaxation).  2. Right ventricular systolic function is normal. The right ventricular  size is normal.  3. The mitral valve is normal in structure. No evidence of mitral valve regurgitation. No evidence of mitral stenosis. Severe mitral annular calcification.  4. The aortic valve is calcified. There is mild calcification of the aortic valve. There is mild thickening of the aortic valve. Aortic valve regurgitation is not visualized. Aortic valve sclerosis is present, with no evidence of aortic valve stenosis.  5. The inferior vena cava is dilated in size with <50% respiratory variability, suggesting right atrial pressure of 15 mmHg. FINDINGS  Left Ventricle: Left ventricular ejection fraction, by estimation, is 60 to 65%. The left ventricle has normal function. The left ventricle has no regional wall motion abnormalities. The left ventricular internal cavity size was normal in size. There is  moderate left ventricular hypertrophy. Left ventricular diastolic parameters are consistent with Grade I diastolic dysfunction (impaired relaxation). Right Ventricle: The  right ventricular size is normal. No increase in right ventricular wall thickness. Right ventricular systolic function is normal. Left Atrium: Left atrial size was normal in size. Right Atrium: Right atrial size was normal in size. Pericardium: There is no evidence of pericardial effusion. Mitral Valve: The mitral valve is normal in structure. Severe mitral annular calcification. No evidence of mitral valve regurgitation. No evidence of mitral valve stenosis. Tricuspid Valve: The tricuspid valve is normal in structure. Tricuspid valve regurgitation is not demonstrated. No evidence of tricuspid stenosis. Aortic Valve: The aortic valve is calcified. There is mild calcification of the aortic valve. There is mild thickening of the aortic valve. Aortic valve regurgitation is not visualized. Aortic valve sclerosis is present, with no evidence of aortic valve stenosis. Pulmonic Valve: The pulmonic valve was normal in structure. Pulmonic valve regurgitation is trivial. No evidence of pulmonic stenosis. Aorta: The aortic root is normal in size and structure. Venous: The inferior vena cava is dilated in size with less than 50% respiratory variability, suggesting right atrial pressure of 15 mmHg. IAS/Shunts: No atrial level shunt detected by color flow Doppler.  LEFT VENTRICLE PLAX 2D LVIDd:         4.40 cm      Diastology LVIDs:         3.00 cm      LV e' medial:    3.70 cm/s LV PW:         1.40 cm      LV E/e' medial:  16.9 LV IVS:        1.40 cm      LV e' lateral:   6.09 cm/s LVOT diam:     2.10 cm      LV E/e' lateral: 10.2 LV SV:         77 LV SV Index:   33 LVOT Area:     3.46 cm  LV Volumes (MOD) LV vol d, MOD A2C: 88.8 ml LV vol d, MOD A4C: 123.0 ml LV vol s, MOD A2C: 43.1 ml LV vol s, MOD A4C: 52.3 ml LV SV MOD A2C:     45.7 ml LV SV MOD A4C:     123.0 ml LV SV MOD BP:      64.6 ml RIGHT VENTRICLE             IVC RV S prime:     14.60 cm/s  IVC diam: 3.25 cm TAPSE (M-mode): 2.2 cm LEFT ATRIUM             Index         RIGHT ATRIUM  Index LA diam:        4.00 cm 1.74 cm/m   RA Area:     17.90 cm LA Vol (A2C):   55.5 ml 24.10 ml/m  RA Volume:   46.10 ml  20.02 ml/m LA Vol (A4C):   70.5 ml 30.61 ml/m LA Biplane Vol: 64.8 ml 28.14 ml/m  AORTIC VALVE LVOT Vmax:   127.00 cm/s LVOT Vmean:  77.800 cm/s LVOT VTI:    0.221 m  AORTA Ao Root diam: 3.30 cm Ao Asc diam:  3.80 cm MITRAL VALVE MV Area (PHT): 2.76 cm     SHUNTS MV Decel Time: 275 msec     Systemic VTI:  0.22 m MV E velocity: 62.40 cm/s   Systemic Diam: 2.10 cm MV A velocity: 115.00 cm/s MV E/A ratio:  0.54 Candee Furbish MD Electronically signed by Candee Furbish MD Signature Date/Time: 09/21/2022/2:06:28 PM    Final     Cardiac Studies   TTE 03/2020: EF 50-55% with possible anteroapical WMA  Stress test with nuclear imaging 04/06/20: Reduced activity at the anterior and apical wall, suspected scar.  Patient Profile     67 y.o. female with a hx of HTN, COPD on home O2, depression, history of DVT who is being seen 09/21/2022 for the evaluation of elevated troponin at the request of Dr. Vaughan Browner.   Assessment & Plan    # Elevated troponin: suspect demand ischemia. TnI is trending down. Would arrange outpatient follow-up and repeat stress testing.  - continue crestor, metoprolol, clopidogrel # Hypertension: with creatinine improving rapidly, would resume losartan, switch from metoprolol to carvedilol. # aspirin intolerance # Unresponsive episode precipitating admission # Acute hypoxic, hypercarbic respiratory failure, strep pneumonia  Cardiology will sign off. Please call with questions.  For questions or updates, please contact [place] Please consult [email or web address] for contact info under Cardiology/STEMI.      Signed, Melida Quitter, MD 09/23/2022, 7:26 AM

## 2022-09-23 NOTE — Progress Notes (Signed)
Patient  has high blood pressure , vital signs at 1953 98.1, HR 77,R 17 BP 172/90 MAP 111 and at 0030 98.1,HR 68,R 20,BP 187/119 MAP 137,took manual BP was 160/110. schedule hydralazine and PRN given. Patient is requesting for her sertraline 100 mg,MD informed,waiting for response.

## 2022-09-24 DIAGNOSIS — J189 Pneumonia, unspecified organism: Secondary | ICD-10-CM | POA: Diagnosis not present

## 2022-09-24 LAB — COMPREHENSIVE METABOLIC PANEL
ALT: 28 U/L (ref 0–44)
AST: 15 U/L (ref 15–41)
Albumin: 3.1 g/dL — ABNORMAL LOW (ref 3.5–5.0)
Alkaline Phosphatase: 60 U/L (ref 38–126)
Anion gap: 11 (ref 5–15)
BUN: 28 mg/dL — ABNORMAL HIGH (ref 8–23)
CO2: 30 mmol/L (ref 22–32)
Calcium: 9 mg/dL (ref 8.9–10.3)
Chloride: 99 mmol/L (ref 98–111)
Creatinine, Ser: 1.04 mg/dL — ABNORMAL HIGH (ref 0.44–1.00)
GFR, Estimated: 60 mL/min — ABNORMAL LOW (ref >60)
Glucose, Bld: 114 mg/dL — ABNORMAL HIGH (ref 70–99)
Potassium: 3.6 mmol/L (ref 3.5–5.1)
Sodium: 140 mmol/L (ref 135–145)
Total Bilirubin: 0.6 mg/dL (ref 0.3–1.2)
Total Protein: 5.7 g/dL — ABNORMAL LOW (ref 6.5–8.1)

## 2022-09-24 LAB — CBC
HCT: 42.6 % (ref 36.0–46.0)
Hemoglobin: 14.1 g/dL (ref 12.0–15.0)
MCH: 29.9 pg (ref 26.0–34.0)
MCHC: 33.1 g/dL (ref 30.0–36.0)
MCV: 90.4 fL (ref 80.0–100.0)
Platelets: 203 10*3/uL (ref 150–400)
RBC: 4.71 MIL/uL (ref 3.87–5.11)
RDW: 13.7 % (ref 11.5–15.5)
WBC: 6.8 10*3/uL (ref 4.0–10.5)
nRBC: 0 % (ref 0.0–0.2)

## 2022-09-24 LAB — LEGIONELLA PNEUMOPHILA SEROGP 1 UR AG: L. pneumophila Serogp 1 Ur Ag: NEGATIVE

## 2022-09-24 LAB — MAGNESIUM: Magnesium: 2 mg/dL (ref 1.7–2.4)

## 2022-09-24 MED ORDER — CLOPIDOGREL BISULFATE 75 MG PO TABS: 75.0000 mg | ORAL_TABLET | Freq: Every day | ORAL | 2 refills | Status: AC

## 2022-09-24 MED ORDER — CARVEDILOL 12.5 MG PO TABS: 12.5000 mg | ORAL_TABLET | Freq: Two times a day (BID) | ORAL | 2 refills | Status: AC

## 2022-09-24 MED ORDER — ROSUVASTATIN CALCIUM 20 MG PO TABS: 20.0000 mg | ORAL_TABLET | Freq: Every day | ORAL | 2 refills | Status: AC

## 2022-09-24 MED ORDER — LOSARTAN POTASSIUM 25 MG PO TABS: 25.0000 mg | ORAL_TABLET | Freq: Every day | ORAL | 2 refills | Status: AC

## 2022-09-24 MED ORDER — CEFDINIR 300 MG PO CAPS: 300.0000 mg | ORAL_CAPSULE | Freq: Two times a day (BID) | ORAL | 0 refills | Status: AC

## 2022-09-24 NOTE — Progress Notes (Signed)
Nurse requested Mobility Specialist to perform oxygen saturation test with pt which includes removing pt from oxygen both at rest and while ambulating.  Below are the results from that testing.     Patient Saturations on Room Air at Rest = spO2 92%  Patient Saturations on Room Air while Ambulating = sp02 86% .  Rested and performed pursed lip breathing for 1 minute with sp02 at 86%.  Patient Saturations on 3 Liters of oxygen while Ambulating = sp02 92%  At end of testing pt left in room on 92  Liters of oxygen.  Reported results to nurse.

## 2022-09-24 NOTE — Progress Notes (Signed)
   09/24/22 1000  Mobility  Activity Ambulated independently in hallway  Level of Assistance Independent  Assistive Device None  Distance Ambulated (ft) 250 ft  Activity Response Tolerated well  Mobility Referral Yes  $Mobility charge 1 Mobility   Mobility Specialist Progress Note  Pt was in bed and agreeable. Had no c/o pain throughout ambulation. Returned to bed w/ all needs met and call bell in reach.   Lucious Groves Mobility Specialist  Please contact via SecureChat or Rehab office at [phone]

## 2022-09-24 NOTE — Progress Notes (Signed)
Discharge instructions given to pt. Pt verbalized understanding of all teaching and had no further questions. Patient discharged to home with daughter via wheelchair by volunteer staff.

## 2022-09-24 NOTE — TOC Initial Note (Addendum)
Transition of Care (TOC) - Initial/Assessment Note  Verbal consult for portable concentrator for home    Spoke to patient and daughter  at bedside.   The patient's concentrator at home was loaned to her 10 years by [employer] ago so company will NOT service it . Her portable one was given to her by a friend so it will not be serviced. None of her DME was done through insurance.    Spoke to [place] with [employer] . [employer] does have a record of her having concentrator at home.    Patient states it was loaned not through insurance. Erasmo Downer requesting an  oxygen saturation ambulation note ( co signed by Dr [place] .   Secure chatted MD and nurse.     Should have an update from [employer] around 10 ish .   Kristin with [employer] returned call. She will need an order for portable oxygen concentrator evaluation. (MD has signed order). MD to sign ambulation oxygen saturation note. [employer] will deliver portable tank to hospital room today prior to discharge and call patient to service her home oxygen concentrator today .   Patient and daughter aware of above. [name] is currently at home and they asked if he could call to arrange service visit prior to patient leaving hospital. Jeralyn Ruths and provided information Laverna Peace 's number is [phone]. [employer] will call Jimmy. Service will be this afternoon.    Patient Details  Name: Laciana Picker MRN: [id number] Date of Birth: Apr 15, 1956  Transition of Care [facility]) CM/SW Contact:    Marilu Favre, RN Phone Number: 09/24/2022, 8:36 AM  Clinical Narrative:                   Expected Discharge Plan: Home/Self Care     Patient Goals and CMS Choice Patient states their goals for this hospitalization and ongoing recovery are:: to return to home CMS Medicare.gov Compare Post Acute Care list provided to:: Patient Choice offered to / list presented to : Patient      Expected Discharge Plan and Services   Discharge Planning Services: CM Consult Post  Acute Care Choice: Durable Medical Equipment Living arrangements for the past 2 months: Single Family Home                 DME Arranged: Oxygen DME Agency: [employer] Date DME Agency Contacted: 09/24/22 Time DME Agency Contacted: [phone] Representative spoke with at DME Agency: Erasmo Downer HH Arranged: NA          Prior Living Arrangements/Services Living arrangements for the past 2 months: Single Family Home Lives with:: Spouse Patient language and need for interpreter reviewed:: Yes Do you feel safe going back to the place where you live?: Yes      Need for Family Participation in Patient Care: Yes (Comment) Care giver support system in place?: Yes (comment) Current home services: DME Criminal Activity/Legal Involvement Pertinent to Current Situation/Hospitalization: No - Comment as needed  Activities of Daily Living      Permission Sought/Granted   Permission granted to share information with : Yes, Verbal Permission Granted  Share Information with NAME: Visitor at bedside           Emotional Assessment Appearance:: Appears stated age Attitude/Demeanor/Rapport: Engaged Affect (typically observed): Accepting Orientation: : Oriented to Self, Oriented to Place, Oriented to  Time, Oriented to Situation Alcohol / Substance Use: Not Applicable Psych Involvement: No (comment)  Admission diagnosis:  Acute respiratory failure with hypoxia and hypercapnia (HCC) [J96.01, J96.02]  Hypercapnic respiratory failure (HCC) [J96.92] Patient Active Problem List   Diagnosis Date Noted   Non-ST elevation (NSTEMI) myocardial infarction ([place]) 09/21/2022   Hypercapnic respiratory failure ([place]) 09/20/2022   Benign essential HTN 04/06/2020   Obesity, Class III, BMI 40-49.9 (morbid obesity) ([place]) 04/04/2020   Seasonal depression ([place]) 04/04/2020   History of DVT (deep vein thrombosis) 04/04/2020   Acute respiratory failure with hypoxia and hypercapnia ([place]) 04/03/2020   Pulmonary embolus  ([place]) 10/29/2012   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, [address] [place] [address] Phone: [phone] Fax: [phone]     Social Determinants of Health (SDOH) Social History: SDOH Screenings   Tobacco Use: Low Risk  (09/20/2022)   SDOH Interventions:     Readmission Risk Interventions     No data to display

## 2022-09-24 NOTE — Care Management Important Message (Signed)
Important Message  Patient Details  Name: [name] MRN: [id number] Date of Birth: 17-Nov-1955   Medicare Important Message Given:  Yes     Hannah Beat 09/24/2022, 12:27 PM

## 2022-09-24 NOTE — Progress Notes (Signed)
Pharmacy updated to [place] [facility] per pt request.

## 2022-09-24 NOTE — Progress Notes (Incomplete)
Nurse requested Mobility Specialist to perform oxygen saturation test with pt which includes removing pt from oxygen both at rest and while ambulating.  Below are the results from that testing.     Patient Saturations on Room Air at Rest = spO2 92%  Patient Saturations on Room Air while Ambulating = sp02 86% .  Rested and performed pursed lip breathing for 1 minute with sp02 at 86%.  Patient Saturations on 3 Liters of oxygen while Ambulating = sp02 92%  At end of testing pt left in room on 3  Liters of oxygen.  Reported results to nurse.

## 2022-09-24 NOTE — Discharge Summary (Signed)
Physician Discharge Summary  [name] [id number] DOB: 1956-03-27 DOA: 09/20/2022  PCP: Shirline Frees, MD  Admit date: 09/20/2022 Discharge date: 09/24/2022  Admitted From: Home Disposition: Home  Recommendations for Outpatient Follow-up:  Follow up with PCP in 1-2 weeks With pulmonology, Dr. Annamaria Boots in 1-2 weeks, would benefit from outpatient  sleep study with suspected obstructive sleep apnea/obesity hypoventilation syndrome Follow-up with cardiology outpatient for consideration of stress test Continue cefdinir to complete antibiotic course for community-acquired pneumonia  Home Health: No Equipment/Devices: Oxygen 3 L per nasal cannula  Discharge Condition: Stable CODE STATUS: Full code Diet recommendation: Heart healthy diet  History of present illness:  [name] is a 67 y.o. 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  EMS was activated.  Patient apparently complained of chest pain day prior to admission but thought it was mostly related to coughing.  Denies any recent travel, sick contacts or tobacco use.   2 weeks prior to admission, was diagnosed with pneumonia and completed a course of Levaquin.  She felt it mostly resolved but still had a nagging cough without fever.   In the ED, ABG with hypercarbic respiratory failure with ABG 7.07/92/64/27, chest x-ray with pulmonary edema and left-sided infiltrate.  Labs significant for creatinine of 2.3 with baseline 0.78, BNP 365, troponin 99-> 345, WBC count 8.9.  Respiratory viral panel was negative.  Patient was started on azithromycin and ceftriaxone; BiPAP with improvement of her mentation.  PCCM was consulted and patient was admitted to the critical care service.    Significant Hospital events: 2/15: Admitted to the PCCM service, on BiPAP and antibiotics 2/16: Urine strep antigen came back positive, azithromycin was discontinued, continued on ceftriaxone.  Troponin elevated to 802, cardiology consulted. TTE with EF 60-65% with normal LV function. EKG NSR with prolonged Qtc.  Troponin elevation thought to be due to demand ischemia 2/18: Transferred to the hospitalist service.  Cardiology now signed off 2/19: Discharging home with outpatient follow-up recommended with PCP, pulmonology and cardiology  Hospital course:  Acute on chronic hypoxic/hypercarbic respiratory failure: Resolved Acute metabolic encephalopathy, POA: Resolved Streptococcus pneumonia Patient presenting to ED after being found unresponsive with her nasal cannula off by family.  Recently treated outpatient for pneumonia with Levaquin.  Patient was noted to be acidotic with elevated CO2 of 92 on ABG.  Imaging concerning for left-sided infiltrate.  Patient was started on azithromycin and ceftriaxone.  Strep urinary antigen returned positive and azithromycin was discontinued.  Patient initially requiring BiPAP which has been titrated off.  Patient now back to her typical baseline.  Will discharge on cefdinir to complete antibiotic course.  Repeat ambulatory O2 screen notable for continued need of home oxygen.  Outpatient follow-up with pulmonology and PCP.   Elevated troponin likely secondary to type II demand ischemia Patient did complain of chest discomfort day prior to admission, resolved since.  Patient's troponin on admission elevated 99 with peak of 870.  EKG with NSR, no concerning dynamic changes.  TTE with LVEF 60 to 65%, normal LV function, no LV regional wall motion normalities, mild LVH, grade 1 diastolic dysfunction, no aortic stenosis, IVC dilated.  Cardiology was consulted and believes her troponin elevation likely secondary to type II demand ischemia in the setting of infectious  process as above.  Cardiology now signed off with recommendation of outpatient follow-up for consideration of outpatient stress test.  Continue Plavix and Crestor.   Acute renal failure Etiology likely secondary to prerenal azotemia in the setting of poor oral intake in the setting of pneumonia.  Creatinine 2.31 on admission, improved to 1.04 at time of discharge.   Essential hypertension Home regimen includes metoprolol succinate 200 mg BID; which was discontinued in favor of carvedilol 12.5 mg p.o. twice daily, losartan 25 mg p.o. daily per cardiology recommendations.  Outpatient follow-up with cardiology/PCP.   Anxiety/depression: Zoloft 100 mg p.o. daily   HLD: Crestor 20 mg p.o. daily   Morbid obesity Body mass index is 42.33 kg/m.   Suspected OHS/OSA Given body habitus, family reports significant snoring during sleep.  Patient follows with pulmonology, Dr. Annamaria Boots outpatient.  Would recommend outpatient sleep study as patient would likely benefit from home CPAP/BiPAP use.  Discharge Diagnoses:  Principal Problem:   Hypercapnic respiratory failure (HCC) Active Problems:   Non-ST elevation (NSTEMI) myocardial infarction [facility])    Discharge Instructions  Discharge Instructions     Call MD for:  difficulty breathing, headache or visual disturbances   Complete by: As directed    Call MD for:  extreme fatigue   Complete by: As directed    Call MD for:  persistant dizziness or light-headedness   Complete by: As directed    Call MD for:  persistant nausea and vomiting   Complete by: As directed    Call MD for:  severe uncontrolled pain   Complete by: As directed    Call MD for:  temperature >100.4   Complete by: As directed    Diet - low sodium heart healthy   Complete by: As directed    Increase activity slowly   Complete by: As directed       Allergies as of 09/24/2022       Reactions   Aspirin Nausea And Vomiting   Sulfa Antibiotics Nausea Only         Medication List     STOP taking these medications    metoprolol succinate 100 MG 24 hr tablet Commonly known as: TOPROL-XL       TAKE these medications    carvedilol 12.5 MG tablet Commonly known as: COREG Take 1 tablet (12.5 mg total) by mouth 2 (two) times daily with a meal.   cefdinir 300 MG capsule Commonly known as: OMNICEF Take 1 capsule (300 mg total) by mouth 2 (two) times daily for 3 days.   clopidogrel 75 MG tablet Commonly known as: PLAVIX Take 1 tablet (75 mg total) by mouth daily.   losartan 25 MG tablet Commonly known as: COZAAR Take 1 tablet (25 mg total) by mouth daily. What changed: when to take this   rosuvastatin 20 MG tablet Commonly known as: CRESTOR Take 1 tablet (20 mg total) by mouth daily.   sertraline 100 MG tablet Commonly known as: ZOLOFT Take 100 mg by mouth daily.               Durable Medical Equipment  (From admission, onward)           Start     Ordered   09/24/22 1023  For home use only DME Other see comment  Once       Comments: Portable concentrator oxygen evaluation  Question:  Length of Need  Answer:  Lifetime   09/24/22 1023   09/23/22 1118  For home use  only DME oxygen  Once       Question Answer Comment  Length of Need Lifetime   Mode or (Route) Nasal cannula   Liters per Minute 3   Frequency Continuous (stationary and portable oxygen unit needed)   Oxygen conserving device Yes   Oxygen delivery system Gas      09/23/22 1118            Follow-up Information     Baird Lyons D, MD. Schedule an appointment as soon as possible for a visit in 1 week(s).   Specialty: Pulmonary Disease Contact information: [address] [address] [phone]         Shirline Frees, MD. Schedule an appointment as soon as possible for a visit in 1 week(s).   Specialty: Family Medicine Contact information: [address] [place] [address] [phone]                 Allergies  Allergen Reactions   Aspirin Nausea And Vomiting   Sulfa Antibiotics Nausea Only    Consultations: PCCM Cardiology   Procedures/Studies: ECHOCARDIOGRAM COMPLETE  Result Date: 09/21/2022    ECHOCARDIOGRAM REPORT   Patient Name:   SANDARA NEUKAM  Date of Exam: 09/21/2022 Medical Rec #:  [id number]  Height:       67.0 in Accession #:    [id number] Weight:       271.6 lb Date of Birth:  February 14, 1956 BSA:          2.303 m Patient Age:    67 years   BP:           144/83 mmHg Patient Gender: F          HR:           67 bpm. Exam Location:  Inpatient Procedure: 2D Echo, 3D Echo, Cardiac Doppler and Color Doppler Indications:    Acute respiratory distress  History:        Patient has prior history of Echocardiogram examinations, most                 recent 04/04/2020. COPD, Signs/Symptoms:Shortness of Breath and                 Dyspnea; Risk Factors:Hypertension. Pulmonary embolus. Pulmonary                 edema.  Sonographer:    Roseanna Rainbow RDCS Referring Phys: [id number] Francesca Jewett  Sonographer Comments: Patient is obese. Image acquisition challenging due to patient body habitus. IMPRESSIONS  1. Left ventricular ejection fraction, by estimation, is 60 to 65%. The left ventricle has normal function. The left ventricle has no regional wall motion abnormalities. There is moderate left ventricular hypertrophy. Left ventricular diastolic parameters are consistent with Grade I diastolic dysfunction (impaired relaxation).  2. Right ventricular systolic function is normal. The right ventricular size is normal.  3. The mitral valve is normal in structure. No evidence of mitral valve regurgitation. No evidence of mitral stenosis. Severe mitral annular calcification.  4. The aortic valve is calcified. There is mild calcification of the aortic valve. There is mild thickening of the aortic valve. Aortic valve regurgitation is not visualized. Aortic valve sclerosis is present, with no evidence of aortic valve  stenosis.  5. The inferior vena cava is dilated in size with <50% respiratory variability, suggesting right atrial pressure of 15 mmHg. FINDINGS  Left Ventricle: Left ventricular ejection fraction, by estimation, is 60  to 65%. The left ventricle has normal function. The left ventricle has no regional wall motion abnormalities. The left ventricular internal cavity size was normal in size. There is  moderate left ventricular hypertrophy. Left ventricular diastolic parameters are consistent with Grade I diastolic dysfunction (impaired relaxation). Right Ventricle: The right ventricular size is normal. No increase in right ventricular wall thickness. Right ventricular systolic function is normal. Left Atrium: Left atrial size was normal in size. Right Atrium: Right atrial size was normal in size. Pericardium: There is no evidence of pericardial effusion. Mitral Valve: The mitral valve is normal in structure. Severe mitral annular calcification. No evidence of mitral valve regurgitation. No evidence of mitral valve stenosis. Tricuspid Valve: The tricuspid valve is normal in structure. Tricuspid valve regurgitation is not demonstrated. No evidence of tricuspid stenosis. Aortic Valve: The aortic valve is calcified. There is mild calcification of the aortic valve. There is mild thickening of the aortic valve. Aortic valve regurgitation is not visualized. Aortic valve sclerosis is present, with no evidence of aortic valve stenosis. Pulmonic Valve: The pulmonic valve was normal in structure. Pulmonic valve regurgitation is trivial. No evidence of pulmonic stenosis. Aorta: The aortic root is normal in size and structure. Venous: The inferior vena cava is dilated in size with less than 50% respiratory variability, suggesting right atrial pressure of 15 mmHg. IAS/Shunts: No atrial level shunt detected by color flow Doppler.  LEFT VENTRICLE PLAX 2D LVIDd:         4.40 cm      Diastology LVIDs:         3.00 cm      LV e' medial:     3.70 cm/s LV PW:         1.40 cm      LV E/e' medial:  16.9 LV IVS:        1.40 cm      LV e' lateral:   6.09 cm/s LVOT diam:     2.10 cm      LV E/e' lateral: 10.2 LV SV:         77 LV SV Index:   33 LVOT Area:     3.46 cm  LV Volumes (MOD) LV vol d, MOD A2C: 88.8 ml LV vol d, MOD A4C: 123.0 ml LV vol s, MOD A2C: 43.1 ml LV vol s, MOD A4C: 52.3 ml LV SV MOD A2C:     45.7 ml LV SV MOD A4C:     123.0 ml LV SV MOD BP:      64.6 ml RIGHT VENTRICLE             IVC RV S prime:     14.60 cm/s  IVC diam: 3.25 cm TAPSE (M-mode): 2.2 cm LEFT ATRIUM             Index        RIGHT ATRIUM           Index LA diam:        4.00 cm 1.74 cm/m   RA Area:     17.90 cm LA Vol (A2C):   55.5 ml 24.10 ml/m  RA Volume:   46.10 ml  20.02 ml/m LA Vol (A4C):   70.5 ml 30.61 ml/m LA Biplane Vol: 64.8 ml 28.14 ml/m  AORTIC VALVE LVOT Vmax:   127.00 cm/s LVOT Vmean:  77.800 cm/s LVOT VTI:    0.221 m  AORTA Ao Root diam: 3.30 cm Ao Asc diam:  3.80 cm MITRAL VALVE  MV Area (PHT): 2.76 cm     SHUNTS MV Decel Time: 275 msec     Systemic VTI:  0.22 m MV E velocity: 62.40 cm/s   Systemic Diam: 2.10 cm MV A velocity: 115.00 cm/s MV E/A ratio:  0.54 Candee Furbish MD Electronically signed by Candee Furbish MD Signature Date/Time: 09/21/2022/2:06:28 PM    Final    DG Chest Portable 1 View  Result Date: 09/20/2022 CLINICAL DATA:  Dyspnea EXAM: PORTABLE CHEST 1 VIEW COMPARISON:  08/25/2022 FINDINGS: There is moderate interstitial pulmonary infiltrate diffusely throughout the right lung and within the left perihilar region which may represent atypical infection or asymmetric pulmonary edema. No pneumothorax or pleural effusion. Apparent cardiomegaly and superior mediastinal widening likely relates to semi-erect positioning. No acute bone abnormality. IMPRESSION: 1. Asymmetric pulmonary infiltrate, asymmetric pulmonary edema versus atypical infection. 2. Apparent cardiomegaly and superior mediastinal widening, possibly related to patient positioning.  This could be confirmed with a standard two view chest radiograph. Electronically Signed   By: Fidela Salisbury M.D.   On: 09/20/2022 19:46     Subjective: Patient seen examined bedside, resting calmly.  Lying in bed.  Family present.  No specific complaints this morning.  Ready for discharge home.  Discussed once again to ensure patient follows up with her pulmonologist, cardiologist and PCP following discharge.  No other specific questions or concerns at this time.  Denies headache, no dizziness, no chest pain, no palpitations, no shortness of breath, no abdominal pain, no fever/chills/night sweats, no nausea/vomiting/diarrhea, no focal weakness, no fatigue, no cough/congestion, no paresthesias.  No acute events overnight per nursing staff.  Discharge Exam: Vitals:   09/24/22 0344 09/24/22 0716  BP: 131/88 (!) 143/78  Pulse: 67 71  Resp: 18 16  Temp: 98.5 F (36.9 C) 98.3 F (36.8 C)  SpO2: 97% 97%   Vitals:   09/23/22 1758 09/23/22 2115 09/24/22 0344 09/24/22 0716  BP: 136/84 (!) 142/87 131/88 (!) 143/78  Pulse: 80 66 67 71  Resp:  18 18 16  $ Temp:  98 F (36.7 C) 98.5 F (36.9 C) 98.3 F (36.8 C)  TempSrc:  Oral Oral Oral  SpO2:  98% 97% 97%  Weight:   128.5 kg   Height:        Physical Exam: GEN: NAD, alert and oriented x 3, obese HEENT: NCAT, PERRL, EOMI, sclera clear, MMM PULM: CTAB w/o wheezes/crackles, normal respiratory effort on 3 L nasal cannula which is her baseline CV: RRR w/o M/G/R GI: abd soft, NTND, NABS, no R/G/M MSK: no peripheral edema, moves all extremities independently NEURO: CN II-XII intact, no focal deficits, sensation to light touch intact PSYCH: normal mood/affect Integumentary: dry/intact, no rashes or wounds    The results of significant diagnostics from this hospitalization (including imaging, microbiology, ancillary and laboratory) are listed below for reference.     Microbiology: Recent Results (from the past 240 hour(s))  Resp panel by  RT-PCR (RSV, Flu A&B, Covid) Anterior Nasal Swab     Status: None   Collection Time: 09/20/22  7:27 PM   Specimen: Anterior Nasal Swab  Result Value Ref Range Status   SARS Coronavirus 2 by RT PCR NEGATIVE NEGATIVE Final   Influenza A by PCR NEGATIVE NEGATIVE Final   Influenza B by PCR NEGATIVE NEGATIVE Final    Comment: (NOTE) The Xpert Xpress SARS-CoV-2/FLU/RSV plus assay is intended as an aid in the diagnosis of influenza from Nasopharyngeal swab specimens and should not be used as a sole basis for  treatment. Nasal washings and aspirates are unacceptable for Xpert Xpress SARS-CoV-2/FLU/RSV testing.  Fact Sheet for Patients: EntrepreneurPulse.com.au  Fact Sheet for Healthcare Providers: IncredibleEmployment.be  This test is not yet approved or cleared by the [place] FDA and has been authorized for detection and/or diagnosis of SARS-CoV-2 by FDA under an Emergency Use Authorization (EUA). This EUA will remain in effect (meaning this test can be used) for the duration of the COVID-19 declaration under Section 564(b)(1) of the Act, 21 U.S.C. section 360bbb-3(b)(1), unless the authorization is terminated or revoked.     Resp Syncytial Virus by PCR NEGATIVE NEGATIVE Final    Comment: (NOTE) Fact Sheet for Patients: EntrepreneurPulse.com.au  Fact Sheet for Healthcare Providers: IncredibleEmployment.be  This test is not yet approved or cleared by the [place] FDA and has been authorized for detection and/or diagnosis of SARS-CoV-2 by FDA under an Emergency Use Authorization (EUA). This EUA will remain in effect (meaning this test can be used) for the duration of the COVID-19 declaration under Section 564(b)(1) of the Act, 21 U.S.C. section 360bbb-3(b)(1), unless the authorization is terminated or revoked.  Performed at [facility], [place] [address], [place], [place] [address]   Respiratory  (~20 pathogens) panel by PCR     Status: None   Collection Time: 09/21/22  2:06 AM   Specimen: Nasopharyngeal Swab; Respiratory  Result Value Ref Range Status   Adenovirus NOT DETECTED NOT DETECTED Final   Coronavirus 229E NOT DETECTED NOT DETECTED Final    Comment: (NOTE) The Coronavirus on the Respiratory Panel, DOES NOT test for the novel  Coronavirus (2019 nCoV)    Coronavirus HKU1 NOT DETECTED NOT DETECTED Final   Coronavirus NL63 NOT DETECTED NOT DETECTED Final   Coronavirus OC43 NOT DETECTED NOT DETECTED Final   Metapneumovirus NOT DETECTED NOT DETECTED Final   Rhinovirus / Enterovirus NOT DETECTED NOT DETECTED Final   Influenza A NOT DETECTED NOT DETECTED Final   Influenza B NOT DETECTED NOT DETECTED Final   Parainfluenza Virus 1 NOT DETECTED NOT DETECTED Final   Parainfluenza Virus 2 NOT DETECTED NOT DETECTED Final   Parainfluenza Virus 3 NOT DETECTED NOT DETECTED Final   Parainfluenza Virus 4 NOT DETECTED NOT DETECTED Final   Respiratory Syncytial Virus NOT DETECTED NOT DETECTED Final   Bordetella pertussis NOT DETECTED NOT DETECTED Final   Bordetella Parapertussis NOT DETECTED NOT DETECTED Final   Chlamydophila pneumoniae NOT DETECTED NOT DETECTED Final   Mycoplasma pneumoniae NOT DETECTED NOT DETECTED Final    Comment: Performed at [facility], [place]. [address], [place], [place] [address]  MRSA Next Gen by PCR, Nasal     Status: None   Collection Time: 09/21/22  2:06 AM   Specimen: Nasal Mucosa; Nasal Swab  Result Value Ref Range Status   MRSA by PCR Next Gen NOT DETECTED NOT DETECTED Final    Comment: (NOTE) The GeneXpert MRSA Assay (FDA approved for NASAL specimens only), is one component of a comprehensive MRSA colonization surveillance program. It is not intended to diagnose MRSA infection nor to guide or monitor treatment for MRSA infections. Test performance is not FDA approved in patients less than 53 years old. Performed at [facility], [place] [address] [place], [place] [address]      Labs: BNP (last 3 results) Recent Labs    09/20/22 1857  BNP [id number]   Basic Metabolic Panel: Recent Labs  Lab 09/20/22 1857 09/20/22 1940 09/20/22 2201 09/21/22 0221 09/21/22 0844 09/21/22 1141  09/22/22 0647 09/24/22 0454  NA 141 143  141   < > 140 140 141 138 140  K 4.5 4.1  4.1   < > 4.6 5.0 4.8 4.0 3.6  CL 103 102  --  102  --   --  101 99  CO2 25  --   --  27  --   --  30 30  GLUCOSE 166* 165*  --  171*  --   --  117* 114*  BUN 21 25*  --  24*  --   --  39* 28*  CREATININE 2.31* 2.10*  --  1.90*  --   --  1.46* 1.04*  CALCIUM 8.7*  --   --  8.7*  --   --  8.9 9.0  MG  --   --   --  2.5*  --   --  2.3 2.0  PHOS  --   --   --  3.1  --   --  1.8*  --    < > = values in this interval not displayed.   Liver Function Tests: Recent Labs  Lab 09/20/22 1857 09/22/22 0647 09/24/22 0454  AST 86*  --  15  ALT 50*  --  28  ALKPHOS 67  --  60  BILITOT 0.9  --  0.6  PROT 6.6  --  5.7*  ALBUMIN 3.5 3.2* 3.1*   Recent Labs  Lab 09/20/22 1857  LIPASE 32   Recent Labs  Lab 09/20/22 1927  AMMONIA 42*   CBC: Recent Labs  Lab 09/20/22 1857 09/20/22 1940 09/21/22 0221 09/21/22 0844 09/21/22 1141 09/22/22 0647 09/24/22 0454  WBC 8.9  --  7.4  --   --  6.8 6.8  NEUTROABS 7.1  --   --   --   --   --   --   HGB 14.8   < > 14.7 13.9 13.6 13.7 14.1  HCT 48.2*   < > 46.4* 41.0 40.0 42.4 42.6  MCV 97.6  --  94.1  --   --  91.4 90.4  PLT 240  --  197  --   --  189 203   < > = values in this interval not displayed.   Cardiac Enzymes: No results for input(s): "CKTOTAL", "CKMB", "CKMBINDEX", "TROPONINI" in the last 168 hours. BNP: Invalid input(s): "POCBNP" CBG: Recent Labs  Lab 09/22/22 1729 09/22/22 1955 09/22/22 2350 09/23/22 0627 09/23/22 2115  GLUCAP 89 109* 105* 126* 111*   D-Dimer No results for input(s): "DDIMER" in the last 72 hours. Hgb A1c No results for input(s): "HGBA1C" in the last 72 hours. Lipid  Profile No results for input(s): "CHOL", "HDL", "LDLCALC", "TRIG", "CHOLHDL", "LDLDIRECT" in the last 72 hours. Thyroid function studies No results for input(s): "TSH", "T4TOTAL", "T3FREE", "THYROIDAB" in the last 72 hours.  Invalid input(s): "FREET3" Anemia work up No results for input(s): "VITAMINB12", "FOLATE", "FERRITIN", "TIBC", "IRON", "RETICCTPCT" in the last 72 hours. Urinalysis    Component Value Date/Time   COLORURINE ORANGE (A) 08/09/2012 2114   APPEARANCEUR TURBID (A) 08/09/2012 2114   LABSPEC 1.023 08/09/2012 2114   PHURINE 5.0 08/09/2012 2114   GLUCOSEU NEGATIVE 08/09/2012 2114   HGBUR LARGE (A) 08/09/2012 2114   BILIRUBINUR MODERATE (A) 08/09/2012 2114   KETONESUR 15 (A) 08/09/2012 2114   PROTEINUR 30 (A) 08/09/2012 2114   UROBILINOGEN 1.0 08/09/2012 2114   NITRITE POSITIVE (A) 08/09/2012 2114   LEUKOCYTESUR LARGE (A) 08/09/2012 2114   Sepsis  Labs Recent Labs  Lab 09/20/22 1857 09/21/22 0221 09/22/22 0647 09/24/22 0454  WBC 8.9 7.4 6.8 6.8   Microbiology Recent Results (from the past 240 hour(s))  Resp panel by RT-PCR (RSV, Flu A&B, Covid) Anterior Nasal Swab     Status: None   Collection Time: 09/20/22  7:27 PM   Specimen: Anterior Nasal Swab  Result Value Ref Range Status   SARS Coronavirus 2 by RT PCR NEGATIVE NEGATIVE Final   Influenza A by PCR NEGATIVE NEGATIVE Final   Influenza B by PCR NEGATIVE NEGATIVE Final    Comment: (NOTE) The Xpert Xpress SARS-CoV-2/FLU/RSV plus assay is intended as an aid in the diagnosis of influenza from Nasopharyngeal swab specimens and should not be used as a sole basis for treatment. Nasal washings and aspirates are unacceptable for Xpert Xpress SARS-CoV-2/FLU/RSV testing.  Fact Sheet for Patients: EntrepreneurPulse.com.au  Fact Sheet for Healthcare Providers: IncredibleEmployment.be  This test is not yet approved or cleared by the [place] FDA and has been authorized for  detection and/or diagnosis of SARS-CoV-2 by FDA under an Emergency Use Authorization (EUA). This EUA will remain in effect (meaning this test can be used) for the duration of the COVID-19 declaration under Section 564(b)(1) of the Act, 21 U.S.C. section 360bbb-3(b)(1), unless the authorization is terminated or revoked.     Resp Syncytial Virus by PCR NEGATIVE NEGATIVE Final    Comment: (NOTE) Fact Sheet for Patients: EntrepreneurPulse.com.au  Fact Sheet for Healthcare Providers: IncredibleEmployment.be  This test is not yet approved or cleared by the [place] FDA and has been authorized for detection and/or diagnosis of SARS-CoV-2 by FDA under an Emergency Use Authorization (EUA). This EUA will remain in effect (meaning this test can be used) for the duration of the COVID-19 declaration under Section 564(b)(1) of the Act, 21 U.S.C. section 360bbb-3(b)(1), unless the authorization is terminated or revoked.  Performed at [facility], [place] [address], [place], [place] [address]   Respiratory (~20 pathogens) panel by PCR     Status: None   Collection Time: 09/21/22  2:06 AM   Specimen: Nasopharyngeal Swab; Respiratory  Result Value Ref Range Status   Adenovirus NOT DETECTED NOT DETECTED Final   Coronavirus 229E NOT DETECTED NOT DETECTED Final    Comment: (NOTE) The Coronavirus on the Respiratory Panel, DOES NOT test for the novel  Coronavirus (2019 nCoV)    Coronavirus HKU1 NOT DETECTED NOT DETECTED Final   Coronavirus NL63 NOT DETECTED NOT DETECTED Final   Coronavirus OC43 NOT DETECTED NOT DETECTED Final   Metapneumovirus NOT DETECTED NOT DETECTED Final   Rhinovirus / Enterovirus NOT DETECTED NOT DETECTED Final   Influenza A NOT DETECTED NOT DETECTED Final   Influenza B NOT DETECTED NOT DETECTED Final   Parainfluenza Virus 1 NOT DETECTED NOT DETECTED Final   Parainfluenza Virus 2 NOT DETECTED NOT DETECTED Final   Parainfluenza  Virus 3 NOT DETECTED NOT DETECTED Final   Parainfluenza Virus 4 NOT DETECTED NOT DETECTED Final   Respiratory Syncytial Virus NOT DETECTED NOT DETECTED Final   Bordetella pertussis NOT DETECTED NOT DETECTED Final   Bordetella Parapertussis NOT DETECTED NOT DETECTED Final   Chlamydophila pneumoniae NOT DETECTED NOT DETECTED Final   Mycoplasma pneumoniae NOT DETECTED NOT DETECTED Final    Comment: Performed at [facility], [place]. [address] [place], [place] [address]  MRSA Next Gen by PCR, Nasal     Status: None   Collection Time: 09/21/22  2:06 AM   Specimen:  Nasal Mucosa; Nasal Swab  Result Value Ref Range Status   MRSA by PCR Next Gen NOT DETECTED NOT DETECTED Final    Comment: (NOTE) The GeneXpert MRSA Assay (FDA approved for NASAL specimens only), is one component of a comprehensive MRSA colonization surveillance program. It is not intended to diagnose MRSA infection nor to guide or monitor treatment for MRSA infections. Test performance is not FDA approved in patients less than 32 years old. Performed at [facility], [place] [address] [place], [place] [address]      Time coordinating discharge: Over 30 minutes  SIGNED:   Agueda Houpt J [place] DO  [facility] 09/24/2022, 11:30 AM

## 2022-09-24 NOTE — Progress Notes (Signed)
Nurse requested Mobility Specialist to perform oxygen saturation test with pt which includes removing pt from oxygen both at rest and while ambulating.  Below are the results from that testing.     Patient Saturations on Room Air at Rest = spO2 92%  Patient Saturations on Room Air while Ambulating = sp02 86% .  Rested and performed pursed lip breathing for 1 minute with sp02 at 86%.  Patient Saturations on 3 Liters of oxygen while Ambulating = sp02 92%  At end of testing pt left in room on 3  Liters of oxygen.  Reported results to nurse.

## 2022-09-26 ENCOUNTER — Encounter (HOSPITAL_COMMUNITY): Payer: Self-pay | Admitting: Cardiology

## 2022-09-27 ENCOUNTER — Telehealth (HOSPITAL_COMMUNITY): Payer: Self-pay

## 2022-09-27 NOTE — Telephone Encounter (Signed)
Detailed instructions left on the patient's answering machine. Asked to call back with any questions. S.Manual [place] EMTP/CCT

## 2022-10-02 ENCOUNTER — Ambulatory Visit (HOSPITAL_COMMUNITY): Payer: Medicare HMO | Attending: Cardiology

## 2022-10-02 DIAGNOSIS — R7989 Other specified abnormal findings of blood chemistry: Secondary | ICD-10-CM

## 2022-10-02 DIAGNOSIS — I209 Angina pectoris, unspecified: Secondary | ICD-10-CM | POA: Diagnosis not present

## 2022-10-02 MED ORDER — REGADENOSON 0.4 MG/5ML IV SOLN
0.4000 mg | Freq: Once | INTRAVENOUS | Status: AC
  Administered 2022-10-02: 0.4 mg via INTRAVENOUS

## 2022-10-02 MED ORDER — TECHNETIUM TC 99M TETROFOSMIN IV KIT
31.4000 | PACK | Freq: Once | INTRAVENOUS | Status: AC | PRN
  Administered 2022-10-02: 31.4 via INTRAVENOUS

## 2022-10-03 ENCOUNTER — Ambulatory Visit (HOSPITAL_COMMUNITY): Payer: Medicare HMO | Attending: Cardiology

## 2022-10-03 LAB — MYOCARDIAL PERFUSION IMAGING
Base ST Depression (mm): 0 mm
LV dias vol: 101 mL (ref 46–106)
LV sys vol: 44 mL
Nuc Stress EF: 56 %
Peak HR: 76 {beats}/min
Rest HR: 52 {beats}/min
Rest Nuclear Isotope Dose: 29.4 mCi
SDS: 3
SRS: 1
SSS: 4
ST Depression (mm): 0 mm
Stress Nuclear Isotope Dose: 31.4 mCi
TID: 0.9

## 2022-10-03 MED ORDER — TECHNETIUM TC 99M TETROFOSMIN IV KIT
29.4000 | PACK | Freq: Once | INTRAVENOUS | Status: AC | PRN
  Administered 2022-10-03: 29.4 via INTRAVENOUS

## 2022-10-05 DIAGNOSIS — Z23 Encounter for immunization: Secondary | ICD-10-CM | POA: Diagnosis not present

## 2022-10-05 DIAGNOSIS — Z6841 Body Mass Index (BMI) 40.0 and over, adult: Secondary | ICD-10-CM | POA: Diagnosis not present

## 2022-10-05 DIAGNOSIS — F324 Major depressive disorder, single episode, in partial remission: Secondary | ICD-10-CM | POA: Diagnosis not present

## 2022-10-05 DIAGNOSIS — I10 Essential (primary) hypertension: Secondary | ICD-10-CM | POA: Diagnosis not present

## 2022-10-05 DIAGNOSIS — J449 Chronic obstructive pulmonary disease, unspecified: Secondary | ICD-10-CM | POA: Diagnosis not present

## 2022-10-05 DIAGNOSIS — J13 Pneumonia due to Streptococcus pneumoniae: Secondary | ICD-10-CM | POA: Diagnosis not present

## 2022-10-25 ENCOUNTER — Encounter: Payer: Self-pay | Admitting: Pulmonary Disease

## 2022-10-25 ENCOUNTER — Ambulatory Visit: Payer: Medicare HMO | Admitting: Pulmonary Disease

## 2022-10-25 VITALS — BP 130/80 | HR 52 | Ht 67.0 in | Wt 274.8 lb

## 2022-10-25 DIAGNOSIS — J101 Influenza due to other identified influenza virus with other respiratory manifestations: Secondary | ICD-10-CM | POA: Diagnosis not present

## 2022-10-25 DIAGNOSIS — J9611 Chronic respiratory failure with hypoxia: Secondary | ICD-10-CM | POA: Diagnosis not present

## 2022-10-25 DIAGNOSIS — G4733 Obstructive sleep apnea (adult) (pediatric): Secondary | ICD-10-CM | POA: Diagnosis not present

## 2022-10-25 DIAGNOSIS — Z8709 Personal history of other diseases of the respiratory system: Secondary | ICD-10-CM

## 2022-10-25 DIAGNOSIS — Z6841 Body Mass Index (BMI) 40.0 and over, adult: Secondary | ICD-10-CM

## 2022-10-25 NOTE — Patient Instructions (Addendum)
Thank you for visiting Dr. Valeta Harms at [facility]. Today we recommend the following:  Continue Oxygen supply needs  We will setup for inlab sleep study due to being on oxygen   Return in about 6 months (around 04/27/2023) for w/ Dr. Ander Slade after sleep study .    Please do your part to reduce the spread of COVID-19.

## 2022-10-25 NOTE — Progress Notes (Signed)
Synopsis: Referred in March 2024 for oxygen dependence by Shirline Frees, MD  Subjective:   PATIENT ID: [name] GENDER: female DOB: [birth year], MRN: [id number]  Chief Complaint  Patient presents with   Consult    COPD    This is a 67 year old female, past medical history of H1 N1 influenza followed by bilateral bacterial pneumonia, prolonged hospitalization for 3+ months, tracheostomy tube and prolonged rehabilitation.  Since then she has been oxygen dependent.  With exertion needs up to 4 L.  Here today to reestablish care with primary pulmonologist.  Patient used to see Dr. Annamaria Boots.  She had PFTs completed in 2014 which shows a reduced FEV1 and FVC, normal ratio, reduced TLC and DLCO of 60% predicted.  She has been a lifelong non-smoker.  She has had recent ER visits and hospitalization for pneumonia.  She has not had a sleep study before but has been recommended to her.     Past Medical History:  Diagnosis Date   DVT (deep venous thrombosis) (HCC)    H1N1 influenza    Hypertension    Morbid obesity ([place])    Seasonal depression ([place])      Family History  Problem Relation Age of Onset   Heart disease Father    Diabetes Father    Hypertension Mother    Arthritis Mother    Hypertension Brother    Arthritis Maternal Grandmother      Past Surgical History:  Procedure Laterality Date   CYST EXCISION  1980   Spinal cyst   KNEE ARTHROSCOPY     bilateral   TONSILLECTOMY  1980    Social History   Socioeconomic History   Marital status: Married    Spouse name: Not on file   Number of children: Not on file   Years of education: Not on file   Highest education level: Not on file  Occupational History   Not on file  Tobacco Use   Smoking status: Never   Smokeless tobacco: Never   Tobacco comments:    never used product  Substance and Sexual Activity   Alcohol use: No   Drug use: No   Sexual activity: Yes    Birth control/protection: None  Other Topics Concern    Not on file  Social History Narrative   Married   3 children   67 yo grandson who lives with them most of the time   Social Determinants of Health   Financial Resource Strain: Not on file  Food Insecurity: Not on file  Transportation Needs: Not on file  Physical Activity: Not on file  Stress: Not on file  Social Connections: Not on file  Intimate Partner Violence: Not on file     Allergies  Allergen Reactions   Aspirin Nausea And Vomiting   Sulfa Antibiotics Nausea Only     Outpatient Medications Prior to Visit  Medication Sig Dispense Refill   carvedilol (COREG) 12.5 MG tablet Take 1 tablet (12.5 mg total) by mouth 2 (two) times daily with a meal. 60 tablet 2   clopidogrel (PLAVIX) 75 MG tablet Take 1 tablet (75 mg total) by mouth daily. 30 tablet 2   losartan (COZAAR) 25 MG tablet Take 1 tablet (25 mg total) by mouth daily. 30 tablet 2   rosuvastatin (CRESTOR) 20 MG tablet Take 1 tablet (20 mg total) by mouth daily. 30 tablet 2   sertraline (ZOLOFT) 100 MG tablet Take 100 mg by mouth daily.     No facility-administered medications  prior to visit.    Review of Systems  Constitutional:  Negative for chills, fever, malaise/fatigue and weight loss.  HENT:  Negative for hearing loss, sore throat and tinnitus.   Eyes:  Negative for blurred vision and double vision.  Respiratory:  Positive for shortness of breath. Negative for cough, hemoptysis, sputum production, wheezing and stridor.   Cardiovascular:  Negative for chest pain, palpitations, orthopnea, leg swelling and PND.  Gastrointestinal:  Negative for abdominal pain, constipation, diarrhea, heartburn, nausea and vomiting.  Genitourinary:  Negative for dysuria, hematuria and urgency.  Musculoskeletal:  Negative for joint pain and myalgias.  Skin:  Negative for itching and rash.  Neurological:  Negative for dizziness, tingling, weakness and headaches.  Endo/Heme/Allergies:  Negative for environmental allergies. Does not  bruise/bleed easily.  Psychiatric/Behavioral:  Negative for depression. The patient is not nervous/anxious and does not have insomnia.   All other systems reviewed and are negative.    Objective:  Physical Exam Vitals reviewed.  Constitutional:      General: She is not in acute distress.    Appearance: She is well-developed. She is obese.  HENT:     Head: Normocephalic and atraumatic.  Eyes:     General: No scleral icterus.    Conjunctiva/sclera: Conjunctivae normal.     Pupils: Pupils are equal, round, and reactive to light.  Neck:     Vascular: No JVD.     Trachea: No tracheal deviation.  Cardiovascular:     Rate and Rhythm: Normal rate and regular rhythm.     Heart sounds: Normal heart sounds. No murmur heard. Pulmonary:     Effort: Pulmonary effort is normal. No tachypnea, accessory muscle usage or respiratory distress.     Breath sounds: No stridor. No wheezing, rhonchi or rales.  Abdominal:     General: There is no distension.     Palpations: Abdomen is soft.     Tenderness: There is no abdominal tenderness.  Musculoskeletal:        General: No tenderness.     Cervical back: Neck supple.  Lymphadenopathy:     Cervical: No cervical adenopathy.  Skin:    General: Skin is warm and dry.     Capillary Refill: Capillary refill takes less than 2 seconds.     Findings: No rash.  Neurological:     Mental Status: She is alert and oriented to person, place, and time.  Psychiatric:        Behavior: Behavior normal.      Vitals:   10/25/22 0831  BP: 130/80  Pulse: (!) 52  SpO2: 98%  Weight: 274 lb 12.8 oz (124.6 kg)  Height: 5\' 7"  (1.702 m)   98% on RA BMI Readings from Last 3 Encounters:  10/25/22 43.04 kg/m  10/02/22 44.32 kg/m  09/24/22 44.37 kg/m   Wt Readings from Last 3 Encounters:  10/25/22 274 lb 12.8 oz (124.6 kg)  10/02/22 283 lb (128.4 kg)  09/24/22 283 lb 4.7 oz (128.5 kg)     CBC    Component Value Date/Time   WBC 6.8 09/24/2022 0454    RBC 4.71 09/24/2022 0454   HGB 14.1 09/24/2022 0454   HGB 13.9 09/11/2013 1145   HCT 42.6 09/24/2022 0454   HCT 41.9 09/11/2013 1145   PLT 203 09/24/2022 0454   PLT 255 09/11/2013 1145   MCV 90.4 09/24/2022 0454   MCV 83 09/11/2013 1145   MCH 29.9 09/24/2022 0454   MCHC 33.1 09/24/2022 0454   RDW 13.7  09/24/2022 0454   RDW 13.7 09/11/2013 1145   LYMPHSABS 0.7 09/20/2022 1857   LYMPHSABS 1.6 09/11/2013 1145   MONOABS 0.9 09/20/2022 1857   EOSABS 0.0 09/20/2022 1857   EOSABS 0.2 09/11/2013 1145   BASOSABS 0.0 09/20/2022 1857   BASOSABS 0.0 09/11/2013 1145     Chest Imaging:  CTA chest 2021: Some peripheral interstitial changes, areas of GGO The patient's images have been independently reviewed by me.    Pulmonary Functions Testing Results:     No data to display          FeNO:   Pathology:   Echocardiogram:   Heart Catheterization:     Assessment & Plan:     ICD-10-CM   1. OSA (obstructive sleep apnea)  G47.33 Split night study    CANCELED: Polysomnography 4 or more parameters (NPSG)    2. History of ARDS  Z87.09     3. H1N1 influenza  J10.1     4. Chronic respiratory failure with hypoxia (HCC)  J96.11     5. BMI 40.0-44.9, adult [facility])  Z68.41       Discussion:  This is a 67 year old morbidly obese female with chronic hypoxemic respiratory lateral failure on 4 L.  History of ARDS and influenza several years ago.  Also history of tracheostomy tube.  Now decannulated and otherwise ambulatory.  She is short of breath with exertion.  Plan: I think she is high risk for obstructive sleep apnea.  She has not had a sleep study before. I have placed orders for a split-night sleep study and follow-up with Dr. Jenetta Downer from our sleep clinic. We will continue her O2 supply needs. She can likely follow-up with Dr. Jenetta Downer after her sleep study is complete.  She can see me in 1 year or as needed. I am not sure that repeat PFTs would change management at this time.  Her oxygen  requirement is related to her previous lung injury and scarring present.   Current Outpatient Medications:    carvedilol (COREG) 12.5 MG tablet, Take 1 tablet (12.5 mg total) by mouth 2 (two) times daily with a meal., Disp: 60 tablet, Rfl: 2   clopidogrel (PLAVIX) 75 MG tablet, Take 1 tablet (75 mg total) by mouth daily., Disp: 30 tablet, Rfl: 2   losartan (COZAAR) 25 MG tablet, Take 1 tablet (25 mg total) by mouth daily., Disp: 30 tablet, Rfl: 2   rosuvastatin (CRESTOR) 20 MG tablet, Take 1 tablet (20 mg total) by mouth daily., Disp: 30 tablet, Rfl: 2   sertraline (ZOLOFT) 100 MG tablet, Take 100 mg by mouth daily., Disp: , Rfl:    Garner Nash, DO [facility] 10/25/2022 9:10 AM

## 2022-11-12 ENCOUNTER — Ambulatory Visit (HOSPITAL_BASED_OUTPATIENT_CLINIC_OR_DEPARTMENT_OTHER): Payer: Medicare HMO | Attending: Pulmonary Disease | Admitting: Pulmonary Disease

## 2022-11-12 VITALS — Ht 67.0 in | Wt 270.0 lb

## 2022-11-12 DIAGNOSIS — G4761 Periodic limb movement disorder: Secondary | ICD-10-CM | POA: Insufficient documentation

## 2022-11-12 DIAGNOSIS — G4736 Sleep related hypoventilation in conditions classified elsewhere: Secondary | ICD-10-CM | POA: Insufficient documentation

## 2022-11-12 DIAGNOSIS — R0683 Snoring: Secondary | ICD-10-CM | POA: Insufficient documentation

## 2022-11-12 DIAGNOSIS — G4733 Obstructive sleep apnea (adult) (pediatric): Secondary | ICD-10-CM | POA: Insufficient documentation

## 2022-11-14 DIAGNOSIS — G4733 Obstructive sleep apnea (adult) (pediatric): Secondary | ICD-10-CM | POA: Diagnosis not present

## 2022-11-15 ENCOUNTER — Encounter: Payer: Self-pay | Admitting: Pulmonary Disease

## 2022-11-15 ENCOUNTER — Ambulatory Visit (INDEPENDENT_AMBULATORY_CARE_PROVIDER_SITE_OTHER): Payer: Medicare HMO | Admitting: Pulmonary Disease

## 2022-11-15 ENCOUNTER — Telehealth: Payer: Self-pay | Admitting: Pulmonary Disease

## 2022-11-15 VITALS — BP 124/76 | HR 56 | Ht 64.0 in | Wt 274.0 lb

## 2022-11-15 DIAGNOSIS — G4761 Periodic limb movement disorder: Secondary | ICD-10-CM

## 2022-11-15 LAB — IBC + FERRITIN
Ferritin: 135.5 ng/mL (ref 10.0–291.0)
Iron: 120 ug/dL (ref 42–145)
Saturation Ratios: 38.1 % (ref 20.0–50.0)
TIBC: 315 ug/dL (ref 250.0–450.0)
Transferrin: 225 mg/dL (ref 212.0–360.0)

## 2022-11-15 NOTE — Progress Notes (Signed)
[name]    [id number]    10/05/1955  Primary Care Physician:Harris, Chrissie Noa, MD  Referring Physician: Johny Blamer, MD [phone] [address] [place],  [place] [address]  Chief complaint:   Patient being seen for concern for obstructive sleep apnea  HPI:  Recently had a sleep study showing a negative study for significant obstructive sleep apnea but with significant periodic limb movement  Patient admits to having restless legs at night Urge to move, usually happens at night, not significantly improved with rest  Has been on Zoloft for about 10 years, trying to wean off  Has history of hypercapnic/hypoxic respiratory failure and is on oxygen supplementation around-the-clock Follows up with Dr. Tonia Brooms Has had discussions about high carbon dioxide levels  She Does Try to Stay Active but Limited by Scoliosis with Associated Back Discomfort  Usually Goes to Bed about 10-11, Takes about 30 Minutes to Fall Asleep 2-3 Awakenings to Use the Bathroom Final Wake up Time between 7 and 8 AM  She Does Have Some Snoring  Sleep Onset Is Affected by restless legs  No family history of restless legs known to, mom did snore  She does try to stay active  Lifelong non-smoker   Outpatient Encounter Medications as of 11/15/2022  Medication Sig   carvedilol (COREG) 12.5 MG tablet Take 1 tablet (12.5 mg total) by mouth 2 (two) times daily with a meal.   clopidogrel (PLAVIX) 75 MG tablet Take 1 tablet (75 mg total) by mouth daily.   losartan (COZAAR) 25 MG tablet Take 1 tablet (25 mg total) by mouth daily.   rosuvastatin (CRESTOR) 20 MG tablet Take 1 tablet (20 mg total) by mouth daily.   sertraline (ZOLOFT) 100 MG tablet Take 100 mg by mouth daily.   No facility-administered encounter medications on file as of 11/15/2022.    Allergies as of 11/15/2022 - Review Complete 11/15/2022  Allergen Reaction Noted   Aspirin Nausea And Vomiting 08/05/2012    Hydrocodone-acetaminophen Other (See Comments) 11/15/2022   Sulfa antibiotics Nausea Only 05/05/2012    Past Medical History:  Diagnosis Date   DVT (deep venous thrombosis)    H1N1 influenza    Hypertension    Morbid obesity    Seasonal depression     Past Surgical History:  Procedure Laterality Date   CYST EXCISION  1980   Spinal cyst   KNEE ARTHROSCOPY     bilateral   TONSILLECTOMY  1980    Family History  Problem Relation Age of Onset   Heart disease Father    Diabetes Father    Hypertension Mother    Arthritis Mother    Hypertension Brother    Arthritis Maternal Grandmother     Social History   Socioeconomic History   Marital status: Married    Spouse name: Not on file   Number of children: Not on file   Years of education: Not on file   Highest education level: Not on file  Occupational History   Not on file  Tobacco Use   Smoking status: Never   Smokeless tobacco: Never   Tobacco comments:    never used product  Substance and Sexual Activity   Alcohol use: No   Drug use: No   Sexual activity: Yes    Birth control/protection: None  Other Topics Concern   Not on file  Social History Narrative   Married   3 children   4 yo grandson who lives  with them most of the time   Social Determinants of Health   Financial Resource Strain: Not on file  Food Insecurity: Not on file  Transportation Needs: Not on file  Physical Activity: Not on file  Stress: Not on file  Social Connections: Not on file  Intimate Partner Violence: Not on file    Review of Systems  Constitutional:  Positive for fatigue.  Respiratory:  Positive for shortness of breath.   Psychiatric/Behavioral:  Positive for sleep disturbance.     Vitals:   11/15/22 0932  BP: 124/76  Pulse: (!) 56  SpO2: 94%     Physical Exam Constitutional:      Appearance: She is obese.  HENT:     Head: Normocephalic.     Mouth/Throat:     Mouth: Mucous membranes are moist.  Eyes:      General: No scleral icterus. Cardiovascular:     Rate and Rhythm: Normal rate and regular rhythm.     Heart sounds: No murmur heard.    No friction rub.  Pulmonary:     Effort: No respiratory distress.     Breath sounds: No stridor. No wheezing or rhonchi.  Musculoskeletal:     Cervical back: No rigidity or tenderness.  Neurological:     Mental Status: She is alert.  Psychiatric:        Mood and Affect: Mood normal.       11/15/2022    9:00 AM  Results of the Epworth flowsheet  Sitting and reading 0  Watching TV 2  Sitting, inactive in a public place (e.g. a theatre or a meeting) 0  As a passenger in a car for an hour without a break 0  Lying down to rest in the afternoon when circumstances permit 2  Sitting and talking to someone 0  Sitting quietly after a lunch without alcohol 1  In a car, while stopped for a few minutes in traffic 0  Total score 5    Data Reviewed: Sleep study performed 11/12/2022 reviewed showing an AHI of 1.2, there was severe number of events during REM sleep however REM sleep was decreased Severe periodic limb movement  Blood gases previously shows hypercapnia  Assessment:  Periodic leg Movement disorder  Restless legs  Advanced chronic obstructive pulmonary disease  Chronic hypoxic/hypercapnic respiratory failure  Class III obesity  Recent hospitalization for hypercapnic respiratory failure, this has not been recurrent  With a negative sleep study, if she were to develop recurrent hospitalizations with hypercapnic failure, a BiPAP may be needed for hypercapnic respiratory failure -With a pCO2 consistently over 55 on previous blood gases, BiPAP may be considered  Plan/Recommendations: Obtain iron studies  Trial with melatonin 3 to 5 mg nightly  Following obtaining iron studies if iron deficient, will replace  If melatonin does not help and patient continues to have significant symptoms associated with restless leg syndrome, may consider  Mirapex or Requip  Encourage graded activities as tolerated  Sleep study was limited by decreased REM sleep percentage which may skew the AHI to be negative, however from the study we have she does not appear to have significant sleep disordered breathing that requires further workup at present  I will see her back in about 3 months  Encouraged to call with any significant concerns   Virl Diamond MD [facility] 11/15/2022, 9:58 AM  CC: Johny Blamer, MD

## 2022-11-15 NOTE — Telephone Encounter (Signed)
Call patient  Sleep study result  Date of study: 11/12/2022  Impression: Negative for significant sleep disordered breathing Severe periodic limb movement  Recommendation:  Evaluate for presence of restless legs  Treatment of periodic limb Movement may be considered if contributing to nonrestorative sleep  Encourage weight loss measures  Follow-up as previously scheduled

## 2022-11-15 NOTE — Procedures (Signed)
POLYSOMNOGRAPHY  Last, First: [name], [name] MRN: [id number] Gender: Female Age (years): 66 Weight (lbs): 270 DOB: [birth year] BMI: 42 Primary Care: No PCP Epworth Score: <br> Referring: Josephine Igo Technician: Lowry Ram Interpreting: Tomma Lightning MD Study Type: NPSG Ordered Study Type: Split Night CPAP Study date: 11/12/2022 Location: [place] CLINICAL INFORMATION [name] is a 67 year old Female and was referred to the sleep center for evaluation of G47.33 OSA: Adult and Pediatric (327.23). Indications include Hypertension, Obesity, OSA.  MEDICATIONS Patient self administered medications include: N/A. Medications administered during study include No sleep medicine administered.  SLEEP STUDY TECHNIQUE A multi-channel overnight Polysomnography study was performed. The channels recorded and monitored were central and occipital EEG, electrooculogram (EOG), submentalis EMG (chin), nasal and oral airflow, thoracic and abdominal wall motion, anterior tibialis EMG, snore microphone, electrocardiogram, and a pulse oximetry. TECHNICIAN COMMENTS Comments added by Technician: None Comments added by Scorer: N/A SLEEP ARCHITECTURE The study was initiated at 10:17:24 PM and terminated at 4:48:58 AM. The total recorded time was 391.6 minutes. EEG confirmed total sleep time was 341 minutes yielding a sleep efficiency of 87.1%. Sleep onset after lights out was 11.3 minutes with a REM latency of 244.0 minutes. The patient spent 1.9% of the night in stage N1 sleep, 95.7% in stage N2 sleep, 0.0% in stage N3 and 2.4% in REM. Wake after sleep onset (WASO) was 39.3 minutes. The Arousal Index was 29.0/hour. RESPIRATORY PARAMETERS There were a total of 7 respiratory disturbances out of which 0 were apneas ( 0 obstructive, 0 mixed, 0 central) and 7 hypopneas. The apnea/hypopnea index (AHI) was 1.2 events/hour. The central sleep apnea index was 0 events/hour. The REM AHI was 52.5 events/hour and  NREM AHI was 0.0 events/hour. The supine AHI was 1.4 events/hour and the non supine AHI was 0 supine during 89.44% of sleep. Respiratory disturbances were associated with oxygen desaturation down to a nadir of 84.0% during sleep. The mean oxygen saturation during the study was 95.2%. The cumulative time under 88% oxygen saturation was 5.5 minutes.  LEG MOVEMENT DATA The total leg movements were 835 with a resulting leg movement index of 146.9/hr .Associated arousal with leg movement index was 14.8/hr.  CARDIAC DATA The underlying cardiac rhythm was most consistent with sinus rhythm. Mean heart rate during sleep was 55.2 bpm. Additional rhythm abnormalities include PVCs.  IMPRESSIONS - No Significant Obstructive Sleep apnea(OSA) - Electrocardiographic data showed presence of PVCs. - Moderate Oxygen Desaturation - The patient snored with moderate snoring volume. - Severe periodic limb movement. Associated arousals were significant with an arousal index of 14.8 /hour. - Severe number of apnea events during REM sleep,however, Rem sleep was severely decreased  DIAGNOSIS - Nocturnal Hypoxemia (G47.36) - Severe periodic limb movement  RECOMMENDATIONS - Mirapex, Requip, or Sinemet for treatment of Periodic Leg Movements of Sleep if associated with restless leg syndrome - Melatonin or clonazepam may be tried for periodic limb movement without restless legs if contributing to non restorative sleep. - Avoid alcohol, sedatives and other CNS depressants that may worsen sleep apnea and disrupt normal sleep architecture. - Sleep hygiene should be reviewed to assess factors that may improve sleep quality. - Weight management and regular exercise should be initiated or continued.  [Electronically signed] 11/15/2022 07:03 AM  Virl Diamond MD NPI: [id number]

## 2022-11-15 NOTE — Patient Instructions (Addendum)
The sleep study did not show significant sleep apnea but did show a lot of leg movement  With your symptoms of restless legs -I do think we should do some studies of your iron stores -If iron studies are low, replacing the iron may help  Medications that can be used to treat restless legs will include -Melatonin may help 3-5 mg-use at nighttime before your symptoms start -If causing significant sleepiness, use it closer to bedtime  -Requip, Mirapex -Medications like Neurontin, Lyrica if there is significant discomfort may also be tried  A lot of leg movement during sleep may be related to antidepressants  Regular exercises as tolerated  Continue your oxygen on a regular basis  I will see you back in 3 months  Call us with significant concerns

## 2022-11-16 NOTE — Telephone Encounter (Signed)
Patient was seen in our office on 11/15/2022 with A office visit with Dr.Olalere and result's was given to patient . Nothing else further needed.

## 2022-11-22 ENCOUNTER — Institutional Professional Consult (permissible substitution): Payer: Medicare HMO | Admitting: Pulmonary Disease

## 2023-04-16 DIAGNOSIS — J449 Chronic obstructive pulmonary disease, unspecified: Secondary | ICD-10-CM | POA: Diagnosis not present

## 2023-04-16 DIAGNOSIS — F324 Major depressive disorder, single episode, in partial remission: Secondary | ICD-10-CM | POA: Diagnosis not present

## 2023-04-16 DIAGNOSIS — Z23 Encounter for immunization: Secondary | ICD-10-CM | POA: Diagnosis not present

## 2023-04-16 DIAGNOSIS — I10 Essential (primary) hypertension: Secondary | ICD-10-CM | POA: Diagnosis not present

## 2023-04-16 DIAGNOSIS — I252 Old myocardial infarction: Secondary | ICD-10-CM | POA: Diagnosis not present

## 2023-04-16 DIAGNOSIS — H6993 Unspecified Eustachian tube disorder, bilateral: Secondary | ICD-10-CM | POA: Diagnosis not present

## 2023-04-16 DIAGNOSIS — K047 Periapical abscess without sinus: Secondary | ICD-10-CM | POA: Diagnosis not present

## 2023-05-31 DIAGNOSIS — N390 Urinary tract infection, site not specified: Secondary | ICD-10-CM | POA: Diagnosis not present

## 2023-11-08 DIAGNOSIS — I252 Old myocardial infarction: Secondary | ICD-10-CM | POA: Diagnosis not present

## 2023-11-08 DIAGNOSIS — J449 Chronic obstructive pulmonary disease, unspecified: Secondary | ICD-10-CM | POA: Diagnosis not present

## 2023-11-08 DIAGNOSIS — N644 Mastodynia: Secondary | ICD-10-CM | POA: Diagnosis not present

## 2023-11-08 DIAGNOSIS — Z1211 Encounter for screening for malignant neoplasm of colon: Secondary | ICD-10-CM | POA: Diagnosis not present

## 2023-11-08 DIAGNOSIS — Z0000 Encounter for general adult medical examination without abnormal findings: Secondary | ICD-10-CM | POA: Diagnosis not present

## 2023-11-08 DIAGNOSIS — I10 Essential (primary) hypertension: Secondary | ICD-10-CM | POA: Diagnosis not present

## 2023-11-08 DIAGNOSIS — Z23 Encounter for immunization: Secondary | ICD-10-CM | POA: Diagnosis not present

## 2023-11-08 DIAGNOSIS — F324 Major depressive disorder, single episode, in partial remission: Secondary | ICD-10-CM | POA: Diagnosis not present

## 2023-11-08 DIAGNOSIS — E2839 Other primary ovarian failure: Secondary | ICD-10-CM | POA: Diagnosis not present

## 2023-11-22 DIAGNOSIS — N958 Other specified menopausal and perimenopausal disorders: Secondary | ICD-10-CM | POA: Diagnosis not present

## 2023-11-22 DIAGNOSIS — N6324 Unspecified lump in the left breast, lower inner quadrant: Secondary | ICD-10-CM | POA: Diagnosis not present

## 2023-11-22 DIAGNOSIS — N644 Mastodynia: Secondary | ICD-10-CM | POA: Diagnosis not present

## 2023-11-22 DIAGNOSIS — E2839 Other primary ovarian failure: Secondary | ICD-10-CM | POA: Diagnosis not present

## 2023-11-28 ENCOUNTER — Other Ambulatory Visit: Payer: Self-pay

## 2023-11-28 DIAGNOSIS — D242 Benign neoplasm of left breast: Secondary | ICD-10-CM | POA: Diagnosis not present

## 2023-11-28 DIAGNOSIS — N6012 Diffuse cystic mastopathy of left breast: Secondary | ICD-10-CM | POA: Diagnosis not present

## 2023-11-28 DIAGNOSIS — N6324 Unspecified lump in the left breast, lower inner quadrant: Secondary | ICD-10-CM | POA: Diagnosis not present

## 2023-11-29 LAB — SURGICAL PATHOLOGY

## 2024-02-18 DIAGNOSIS — S022XXA Fracture of nasal bones, initial encounter for closed fracture: Secondary | ICD-10-CM | POA: Diagnosis not present

## 2024-02-18 DIAGNOSIS — R0902 Hypoxemia: Secondary | ICD-10-CM | POA: Diagnosis not present

## 2024-02-18 DIAGNOSIS — S0990XA Unspecified injury of head, initial encounter: Secondary | ICD-10-CM | POA: Diagnosis not present

## 2024-02-18 DIAGNOSIS — W19XXXA Unspecified fall, initial encounter: Secondary | ICD-10-CM | POA: Diagnosis not present

## 2024-02-18 DIAGNOSIS — Z886 Allergy status to analgesic agent status: Secondary | ICD-10-CM | POA: Diagnosis not present

## 2024-02-18 DIAGNOSIS — Z885 Allergy status to narcotic agent status: Secondary | ICD-10-CM | POA: Diagnosis not present

## 2024-02-18 DIAGNOSIS — Z043 Encounter for examination and observation following other accident: Secondary | ICD-10-CM | POA: Diagnosis not present

## 2024-02-18 DIAGNOSIS — Z882 Allergy status to sulfonamides status: Secondary | ICD-10-CM | POA: Diagnosis not present

## 2024-02-18 DIAGNOSIS — Z7902 Long term (current) use of antithrombotics/antiplatelets: Secondary | ICD-10-CM | POA: Diagnosis not present

## 2024-02-18 DIAGNOSIS — R609 Edema, unspecified: Secondary | ICD-10-CM | POA: Diagnosis not present

## 2024-02-18 DIAGNOSIS — Z881 Allergy status to other antibiotic agents status: Secondary | ICD-10-CM | POA: Diagnosis not present

## 2024-02-22 ENCOUNTER — Emergency Department (HOSPITAL_COMMUNITY)
Admission: EM | Admit: 2024-02-22 | Discharge: 2024-02-22 | Disposition: A | Discharge status: Home / Self Care | Source: Home / Self Care | Attending: Emergency Medicine | Admitting: Emergency Medicine

## 2024-02-22 ENCOUNTER — Encounter (HOSPITAL_COMMUNITY): Payer: Self-pay | Admitting: Emergency Medicine

## 2024-02-22 ENCOUNTER — Other Ambulatory Visit: Payer: Self-pay

## 2024-02-22 DIAGNOSIS — I10 Essential (primary) hypertension: Secondary | ICD-10-CM | POA: Diagnosis not present

## 2024-02-22 DIAGNOSIS — W19XXXA Unspecified fall, initial encounter: Secondary | ICD-10-CM | POA: Insufficient documentation

## 2024-02-22 DIAGNOSIS — S0033XA Contusion of nose, initial encounter: Secondary | ICD-10-CM | POA: Insufficient documentation

## 2024-02-22 DIAGNOSIS — I499 Cardiac arrhythmia, unspecified: Secondary | ICD-10-CM | POA: Diagnosis not present

## 2024-02-22 DIAGNOSIS — S0083XA Contusion of other part of head, initial encounter: Secondary | ICD-10-CM | POA: Insufficient documentation

## 2024-02-22 DIAGNOSIS — J9622 Acute and chronic respiratory failure with hypercapnia: Secondary | ICD-10-CM | POA: Diagnosis not present

## 2024-02-22 DIAGNOSIS — R040 Epistaxis: Secondary | ICD-10-CM | POA: Insufficient documentation

## 2024-02-22 DIAGNOSIS — I491 Atrial premature depolarization: Secondary | ICD-10-CM | POA: Diagnosis not present

## 2024-02-22 DIAGNOSIS — R0902 Hypoxemia: Secondary | ICD-10-CM | POA: Diagnosis not present

## 2024-02-22 DIAGNOSIS — R58 Hemorrhage, not elsewhere classified: Secondary | ICD-10-CM | POA: Diagnosis not present

## 2024-02-22 DIAGNOSIS — N179 Acute kidney failure, unspecified: Secondary | ICD-10-CM | POA: Diagnosis not present

## 2024-02-22 LAB — COMPREHENSIVE METABOLIC PANEL WITH GFR
ALT: 12 U/L (ref 0–44)
AST: 18 U/L (ref 15–41)
Albumin: 3.3 g/dL — ABNORMAL LOW (ref 3.5–5.0)
Alkaline Phosphatase: 65 U/L (ref 38–126)
Anion gap: 10 (ref 5–15)
BUN: 15 mg/dL (ref 8–23)
CO2: 30 mmol/L (ref 22–32)
Calcium: 8.8 mg/dL — ABNORMAL LOW (ref 8.9–10.3)
Chloride: 102 mmol/L (ref 98–111)
Creatinine, Ser: 0.6 mg/dL (ref 0.44–1.00)
GFR, Estimated: >60 mL/min (ref >60)
Glucose, Bld: 130 mg/dL — ABNORMAL HIGH (ref 70–99)
Potassium: 4.5 mmol/L (ref 3.5–5.1)
Sodium: 142 mmol/L (ref 135–145)
Total Bilirubin: 1.2 mg/dL (ref 0.0–1.2)
Total Protein: 6.3 g/dL — ABNORMAL LOW (ref 6.5–8.1)

## 2024-02-22 LAB — CBC WITH DIFFERENTIAL/PLATELET
Abs Immature Granulocytes: 0.02 K/uL (ref 0.00–0.07)
Basophils Absolute: 0.1 K/uL (ref 0.0–0.1)
Basophils Relative: 1 %
Eosinophils Absolute: 0.2 K/uL (ref 0.0–0.5)
Eosinophils Relative: 2 %
HCT: 38.9 % (ref 36.0–46.0)
Hemoglobin: 12.1 g/dL (ref 12.0–15.0)
Immature Granulocytes: 0 %
Lymphocytes Relative: 12 %
Lymphs Abs: 0.9 K/uL (ref 0.7–4.0)
MCH: 28.4 pg (ref 26.0–34.0)
MCHC: 31.1 g/dL (ref 30.0–36.0)
MCV: 91.3 fL (ref 80.0–100.0)
Monocytes Absolute: 0.4 K/uL (ref 0.1–1.0)
Monocytes Relative: 5 %
Neutro Abs: 6.3 K/uL (ref 1.7–7.7)
Neutrophils Relative %: 80 %
Platelets: 237 K/uL (ref 150–400)
RBC: 4.26 MIL/uL (ref 3.87–5.11)
RDW: 13.1 % (ref 11.5–15.5)
WBC: 7.8 K/uL (ref 4.0–10.5)
nRBC: 0 % (ref 0.0–0.2)

## 2024-02-22 LAB — TYPE AND SCREEN
ABO/RH(D): O NEG
Antibody Screen: NEGATIVE

## 2024-02-22 LAB — PROTIME-INR
INR: 1 (ref 0.8–1.2)
Prothrombin Time: 13.3 s (ref 11.4–15.2)

## 2024-02-22 LAB — ABO/RH: ABO/RH(D): O NEG

## 2024-02-22 MED ORDER — AMOXICILLIN-POT CLAVULANATE 875-125 MG PO TABS: 1.0000 | ORAL_TABLET | Freq: Two times a day (BID) | ORAL | 0 refills | Status: DC

## 2024-02-22 MED ORDER — AMOXICILLIN-POT CLAVULANATE 875-125 MG PO TABS
1.0000 | ORAL_TABLET | Freq: Once | ORAL | Status: AC
  Administered 2024-02-22: 1 via ORAL
  Filled 2024-02-22: qty 1

## 2024-02-22 NOTE — ED Provider Notes (Signed)
 [facility] Provider Note   CSN: [id number] Arrival date & time: 02/22/24  8076     Patient presents with: Epistaxis   [name] is a 68 y.o. female.   HPI 68 year old female with a history of a nasal fracture 3 days ago after a fall presents with sudden onset nasal bleeding today.  Seems like it is coming out of the right nare but is currently coming out of both.  EMS reports they have been unable to control the bleeding.  No new trauma.  No dyspnea though she was noted to be hypoxic with EMS and was having to breathe out of her mouth.  Prior to Admission medications   Medication Sig Start Date End Date Taking? Authorizing Provider  amoxicillin -clavulanate (AUGMENTIN ) 875-125 MG tablet Take 1 tablet by mouth every 12 (twelve) hours for 5 days. 02/22/24 02/27/24 Yes Freddi Hamilton, MD  carvedilol  (COREG ) 12.5 MG tablet Take 1 tablet (12.5 mg total) by mouth 2 (two) times daily with a meal. 09/24/22 12/23/22  [place], Camellia PARAS, DO  losartan  (COZAAR ) 25 MG tablet Take 1 tablet (25 mg total) by mouth daily. 09/24/22 12/23/22  [place], Camellia PARAS, DO  rosuvastatin  (CRESTOR ) 20 MG tablet Take 1 tablet (20 mg total) by mouth daily. 09/24/22 12/23/22  [place], Camellia PARAS, DO  sertraline  (ZOLOFT ) 100 MG tablet Take 100 mg by mouth daily. 08/03/22   [provider]    Allergies: Aspirin, Hydrocodone-acetaminophen , and Sulfa antibiotics    Review of Systems  HENT:  Positive for nosebleeds.   Respiratory:  Negative for shortness of breath.     Updated Vital Signs BP (!) 154/86   Pulse 78   Temp 98.2 F (36.8 C) (Oral)   Resp 20   Ht 5' 4 (1.626 m)   Wt 125 kg   SpO2 92%   BMI 47.30 kg/m   Physical Exam Vitals and nursing note reviewed.  Constitutional:      Appearance: She is well-developed. She is obese.  HENT:     Head: Normocephalic.     Comments: Significant bruising to the face and nose.    Nose:     Right Nostril: Epistaxis  present.     Comments: At first there is bilateral epistaxis but it seems like at times it was only coming out of the right nare.  Not much clot present but the degree of epistaxis makes it hard to find the exact source. Cardiovascular:     Rate and Rhythm: Normal rate and regular rhythm.  Pulmonary:     Effort: Pulmonary effort is normal.  Skin:    General: Skin is warm and dry.  Neurological:     Mental Status: She is alert.     (all labs ordered are listed, but only abnormal results are displayed) Labs Reviewed  COMPREHENSIVE METABOLIC PANEL WITH GFR - Abnormal; Notable for the following components:      Result Value   Glucose, Bld 130 (*)    Calcium  8.8 (*)    Total Protein 6.3 (*)    Albumin 3.3 (*)    All other components within normal limits  CBC WITH DIFFERENTIAL/PLATELET  PROTIME-INR  TYPE AND SCREEN  ABO/RH    EKG: None  Radiology: No results found.   Epistaxis Management  Date/Time: 02/22/2024 7:47 PM  Performed by: Freddi Hamilton, MD Authorized by: Freddi Hamilton, MD   Consent:    Consent obtained:  Verbal   Consent given by:  Patient  Procedure details:    Treatment site:  R posterior and R anterior   Repair method: rhino rocket.   Treatment episode: initial   Post-procedure details:    Assessment:  Bleeding decreased   Procedure completion:  Tolerated well, no immediate complications    Medications Ordered in the ED  amoxicillin -clavulanate (AUGMENTIN ) 875-125 MG per tablet 1 tablet (1 tablet Oral Given 02/22/24 2140)                                    Medical Decision Making Amount and/or Complexity of Data Reviewed Labs: ordered.    Details: Normal hemoglobin  Risk Prescription drug management.   Patient's acute bleeding was treated with an anterior/posterior pack.  This has controlled the bleeding.  She has intermittently had some mild dripping but the bleeding is a lot better.  She feels well enough to go home.  She does wear  oxygen at baseline so I do not think she is newly hypoxic.  Hemoglobin is okay.  Given the posterior component I think is reasonable to treat her with some prophylactic antibiotics.  Otherwise, she is now looking a lot better and resting comfortably with the bleeding controlled.  Will refer to local ENT as she was referred to an ENT in an outside city.  Will give return precautions.     Final diagnoses:  Right-sided epistaxis    ED Discharge Orders          Ordered    amoxicillin -clavulanate (AUGMENTIN ) 875-125 MG tablet  Every 12 hours        02/22/24 2108               Freddi Hamilton, MD 02/22/24 2214

## 2024-02-22 NOTE — ED Triage Notes (Signed)
 Pt arrive by [facility] from home for c/o nose bleed for one hour pta. Pt is on Plavix  had a fall 3 days ago with a reported broken nose bones. Pt's face all bruise from prior fall. Pt is breathing through her mouth with a SPO2 88% on RA.

## 2024-02-22 NOTE — Discharge Instructions (Addendum)
 Leave the packing in until you see the ENT.  Call their office on Monday for an appointment on Monday.  Otherwise, return to the ER if you develop recurrent bleeding, trouble breathing, or any other new/concerning symptoms.

## 2024-02-23 ENCOUNTER — Inpatient Hospital Stay (HOSPITAL_COMMUNITY)
Admission: EM | Admit: 2024-02-23 | Discharge: 2024-02-28 | DRG: 208 | Disposition: A | Discharge status: Home / Self Care | Attending: Family Medicine | Admitting: Family Medicine

## 2024-02-23 ENCOUNTER — Encounter (HOSPITAL_COMMUNITY): Payer: Self-pay | Admitting: Emergency Medicine

## 2024-02-23 ENCOUNTER — Emergency Department (HOSPITAL_COMMUNITY)

## 2024-02-23 ENCOUNTER — Other Ambulatory Visit: Payer: Self-pay

## 2024-02-23 DIAGNOSIS — E872 Acidosis, unspecified: Secondary | ICD-10-CM | POA: Diagnosis not present

## 2024-02-23 DIAGNOSIS — S0990XA Unspecified injury of head, initial encounter: Secondary | ICD-10-CM | POA: Diagnosis not present

## 2024-02-23 DIAGNOSIS — R0902 Hypoxemia: Secondary | ICD-10-CM | POA: Diagnosis not present

## 2024-02-23 DIAGNOSIS — E66813 Obesity, class 3: Secondary | ICD-10-CM | POA: Diagnosis present

## 2024-02-23 DIAGNOSIS — J9621 Acute and chronic respiratory failure with hypoxia: Secondary | ICD-10-CM | POA: Diagnosis present

## 2024-02-23 DIAGNOSIS — W19XXXA Unspecified fall, initial encounter: Secondary | ICD-10-CM | POA: Diagnosis present

## 2024-02-23 DIAGNOSIS — J9602 Acute respiratory failure with hypercapnia: Secondary | ICD-10-CM | POA: Diagnosis not present

## 2024-02-23 DIAGNOSIS — S0033XA Contusion of nose, initial encounter: Secondary | ICD-10-CM | POA: Diagnosis not present

## 2024-02-23 DIAGNOSIS — K760 Fatty (change of) liver, not elsewhere classified: Secondary | ICD-10-CM | POA: Diagnosis not present

## 2024-02-23 DIAGNOSIS — K5730 Diverticulosis of large intestine without perforation or abscess without bleeding: Secondary | ICD-10-CM | POA: Diagnosis not present

## 2024-02-23 DIAGNOSIS — I959 Hypotension, unspecified: Secondary | ICD-10-CM | POA: Diagnosis not present

## 2024-02-23 DIAGNOSIS — R040 Epistaxis: Secondary | ICD-10-CM | POA: Diagnosis present

## 2024-02-23 DIAGNOSIS — J9622 Acute and chronic respiratory failure with hypercapnia: Secondary | ICD-10-CM | POA: Diagnosis not present

## 2024-02-23 DIAGNOSIS — I130 Hypertensive heart and chronic kidney disease with heart failure and stage 1 through stage 4 chronic kidney disease, or unspecified chronic kidney disease: Secondary | ICD-10-CM | POA: Diagnosis present

## 2024-02-23 DIAGNOSIS — I10 Essential (primary) hypertension: Secondary | ICD-10-CM | POA: Diagnosis present

## 2024-02-23 DIAGNOSIS — S0083XA Contusion of other part of head, initial encounter: Secondary | ICD-10-CM | POA: Diagnosis present

## 2024-02-23 DIAGNOSIS — R0603 Acute respiratory distress: Secondary | ICD-10-CM | POA: Diagnosis not present

## 2024-02-23 DIAGNOSIS — E875 Hyperkalemia: Secondary | ICD-10-CM | POA: Diagnosis not present

## 2024-02-23 DIAGNOSIS — M6282 Rhabdomyolysis: Secondary | ICD-10-CM | POA: Diagnosis present

## 2024-02-23 DIAGNOSIS — R110 Nausea: Secondary | ICD-10-CM | POA: Diagnosis not present

## 2024-02-23 DIAGNOSIS — I5032 Chronic diastolic (congestive) heart failure: Secondary | ICD-10-CM | POA: Diagnosis present

## 2024-02-23 DIAGNOSIS — E8729 Other acidosis: Principal | ICD-10-CM | POA: Diagnosis present

## 2024-02-23 DIAGNOSIS — R933 Abnormal findings on diagnostic imaging of other parts of digestive tract: Secondary | ICD-10-CM | POA: Diagnosis not present

## 2024-02-23 DIAGNOSIS — R531 Weakness: Secondary | ICD-10-CM | POA: Diagnosis not present

## 2024-02-23 DIAGNOSIS — R7989 Other specified abnormal findings of blood chemistry: Secondary | ICD-10-CM | POA: Diagnosis present

## 2024-02-23 DIAGNOSIS — J4489 Other specified chronic obstructive pulmonary disease: Secondary | ICD-10-CM | POA: Diagnosis present

## 2024-02-23 DIAGNOSIS — R918 Other nonspecific abnormal finding of lung field: Secondary | ICD-10-CM | POA: Diagnosis not present

## 2024-02-23 DIAGNOSIS — G9341 Metabolic encephalopathy: Secondary | ICD-10-CM | POA: Diagnosis present

## 2024-02-23 DIAGNOSIS — Z1152 Encounter for screening for COVID-19: Secondary | ICD-10-CM | POA: Diagnosis not present

## 2024-02-23 DIAGNOSIS — S022XXD Fracture of nasal bones, subsequent encounter for fracture with routine healing: Secondary | ICD-10-CM | POA: Diagnosis not present

## 2024-02-23 DIAGNOSIS — I252 Old myocardial infarction: Secondary | ICD-10-CM | POA: Diagnosis not present

## 2024-02-23 DIAGNOSIS — D649 Anemia, unspecified: Secondary | ICD-10-CM | POA: Diagnosis not present

## 2024-02-23 DIAGNOSIS — R7401 Elevation of levels of liver transaminase levels: Secondary | ICD-10-CM | POA: Diagnosis not present

## 2024-02-23 DIAGNOSIS — R109 Unspecified abdominal pain: Secondary | ICD-10-CM | POA: Diagnosis not present

## 2024-02-23 DIAGNOSIS — R45851 Suicidal ideations: Secondary | ICD-10-CM | POA: Diagnosis present

## 2024-02-23 DIAGNOSIS — G4733 Obstructive sleep apnea (adult) (pediatric): Secondary | ICD-10-CM | POA: Diagnosis present

## 2024-02-23 DIAGNOSIS — S022XXA Fracture of nasal bones, initial encounter for closed fracture: Secondary | ICD-10-CM | POA: Diagnosis not present

## 2024-02-23 DIAGNOSIS — Z86718 Personal history of other venous thrombosis and embolism: Secondary | ICD-10-CM

## 2024-02-23 DIAGNOSIS — Z6841 Body Mass Index (BMI) 40.0 and over, adult: Secondary | ICD-10-CM

## 2024-02-23 DIAGNOSIS — R748 Abnormal levels of other serum enzymes: Secondary | ICD-10-CM

## 2024-02-23 DIAGNOSIS — J449 Chronic obstructive pulmonary disease, unspecified: Secondary | ICD-10-CM | POA: Diagnosis not present

## 2024-02-23 DIAGNOSIS — J811 Chronic pulmonary edema: Secondary | ICD-10-CM | POA: Diagnosis not present

## 2024-02-23 DIAGNOSIS — J42 Unspecified chronic bronchitis: Secondary | ICD-10-CM | POA: Diagnosis not present

## 2024-02-23 DIAGNOSIS — N182 Chronic kidney disease, stage 2 (mild): Secondary | ICD-10-CM | POA: Diagnosis present

## 2024-02-23 DIAGNOSIS — Z79899 Other long term (current) drug therapy: Secondary | ICD-10-CM

## 2024-02-23 DIAGNOSIS — E785 Hyperlipidemia, unspecified: Secondary | ICD-10-CM | POA: Diagnosis present

## 2024-02-23 DIAGNOSIS — Z9981 Dependence on supplemental oxygen: Secondary | ICD-10-CM | POA: Diagnosis not present

## 2024-02-23 DIAGNOSIS — N179 Acute kidney failure, unspecified: Secondary | ICD-10-CM

## 2024-02-23 DIAGNOSIS — J9610 Chronic respiratory failure, unspecified whether with hypoxia or hypercapnia: Secondary | ICD-10-CM | POA: Diagnosis present

## 2024-02-23 DIAGNOSIS — I2510 Atherosclerotic heart disease of native coronary artery without angina pectoris: Secondary | ICD-10-CM | POA: Diagnosis present

## 2024-02-23 DIAGNOSIS — K7200 Acute and subacute hepatic failure without coma: Secondary | ICD-10-CM | POA: Diagnosis present

## 2024-02-23 DIAGNOSIS — Z4682 Encounter for fitting and adjustment of non-vascular catheter: Secondary | ICD-10-CM | POA: Diagnosis not present

## 2024-02-23 DIAGNOSIS — R000 Tachycardia, unspecified: Secondary | ICD-10-CM | POA: Diagnosis not present

## 2024-02-23 DIAGNOSIS — D631 Anemia in chronic kidney disease: Secondary | ICD-10-CM | POA: Diagnosis not present

## 2024-02-23 DIAGNOSIS — R4182 Altered mental status, unspecified: Secondary | ICD-10-CM | POA: Diagnosis not present

## 2024-02-23 DIAGNOSIS — J9612 Chronic respiratory failure with hypercapnia: Secondary | ICD-10-CM

## 2024-02-23 DIAGNOSIS — J9611 Chronic respiratory failure with hypoxia: Secondary | ICD-10-CM

## 2024-02-23 DIAGNOSIS — R0989 Other specified symptoms and signs involving the circulatory and respiratory systems: Secondary | ICD-10-CM | POA: Diagnosis not present

## 2024-02-23 DIAGNOSIS — Z86711 Personal history of pulmonary embolism: Secondary | ICD-10-CM

## 2024-02-23 DIAGNOSIS — R012 Other cardiac sounds: Secondary | ICD-10-CM | POA: Diagnosis not present

## 2024-02-23 DIAGNOSIS — N189 Chronic kidney disease, unspecified: Secondary | ICD-10-CM | POA: Diagnosis not present

## 2024-02-23 DIAGNOSIS — I517 Cardiomegaly: Secondary | ICD-10-CM | POA: Diagnosis not present

## 2024-02-23 DIAGNOSIS — J9601 Acute respiratory failure with hypoxia: Secondary | ICD-10-CM | POA: Diagnosis present

## 2024-02-23 DIAGNOSIS — Z7902 Long term (current) use of antithrombotics/antiplatelets: Secondary | ICD-10-CM

## 2024-02-23 LAB — COMPREHENSIVE METABOLIC PANEL WITH GFR
ALT: 1607 U/L — ABNORMAL HIGH (ref 0–44)
AST: 1714 U/L — ABNORMAL HIGH (ref 15–41)
Albumin: 3.2 g/dL — ABNORMAL LOW (ref 3.5–5.0)
Alkaline Phosphatase: 63 U/L (ref 38–126)
Anion gap: 12 (ref 5–15)
BUN: 38 mg/dL — ABNORMAL HIGH (ref 8–23)
CO2: 31 mmol/L (ref 22–32)
Calcium: 8.9 mg/dL (ref 8.9–10.3)
Chloride: 98 mmol/L (ref 98–111)
Creatinine, Ser: 2.34 mg/dL — ABNORMAL HIGH (ref 0.44–1.00)
GFR, Estimated: 22 mL/min — ABNORMAL LOW (ref >60)
Glucose, Bld: 136 mg/dL — ABNORMAL HIGH (ref 70–99)
Potassium: 4.9 mmol/L (ref 3.5–5.1)
Sodium: 141 mmol/L (ref 135–145)
Total Bilirubin: 0.8 mg/dL (ref 0.0–1.2)
Total Protein: 6.1 g/dL — ABNORMAL LOW (ref 6.5–8.1)

## 2024-02-23 LAB — CBC WITH DIFFERENTIAL/PLATELET
Abs Immature Granulocytes: 0.05 K/uL (ref 0.00–0.07)
Basophils Absolute: 0 K/uL (ref 0.0–0.1)
Basophils Relative: 0 %
Eosinophils Absolute: 0 K/uL (ref 0.0–0.5)
Eosinophils Relative: 0 %
HCT: 35.9 % — ABNORMAL LOW (ref 36.0–46.0)
Hemoglobin: 11.2 g/dL — ABNORMAL LOW (ref 12.0–15.0)
Immature Granulocytes: 1 %
Lymphocytes Relative: 6 %
Lymphs Abs: 0.6 K/uL — ABNORMAL LOW (ref 0.7–4.0)
MCH: 28.7 pg (ref 26.0–34.0)
MCHC: 31.2 g/dL (ref 30.0–36.0)
MCV: 92.1 fL (ref 80.0–100.0)
Monocytes Absolute: 0.6 K/uL (ref 0.1–1.0)
Monocytes Relative: 6 %
Neutro Abs: 9.1 K/uL — ABNORMAL HIGH (ref 1.7–7.7)
Neutrophils Relative %: 87 %
Platelets: 183 K/uL (ref 150–400)
RBC: 3.9 MIL/uL (ref 3.87–5.11)
RDW: 13.7 % (ref 11.5–15.5)
WBC: 10.4 K/uL (ref 4.0–10.5)
nRBC: 0 % (ref 0.0–0.2)

## 2024-02-23 LAB — I-STAT VENOUS BLOOD GAS, ED
Acid-Base Excess: 3 mmol/L — ABNORMAL HIGH (ref 0.0–2.0)
Bicarbonate: 32.4 mmol/L — ABNORMAL HIGH (ref 20.0–28.0)
Calcium, Ion: 1.19 mmol/L (ref 1.15–1.40)
HCT: 33 % — ABNORMAL LOW (ref 36.0–46.0)
Hemoglobin: 11.2 g/dL — ABNORMAL LOW (ref 12.0–15.0)
O2 Saturation: 46 %
Potassium: 4.7 mmol/L (ref 3.5–5.1)
Sodium: 140 mmol/L (ref 135–145)
TCO2: 35 mmol/L — ABNORMAL HIGH (ref 22–32)
pCO2, Ven: 75.8 mmHg (ref 44–60)
pH, Ven: 7.239 — ABNORMAL LOW (ref 7.25–7.43)
pO2, Ven: 31 mmHg — CL (ref 32–45)

## 2024-02-23 LAB — CBG MONITORING, ED: Glucose-Capillary: 111 mg/dL — ABNORMAL HIGH (ref 70–99)

## 2024-02-23 LAB — ETHANOL: Alcohol, Ethyl (B): <15 mg/dL (ref <15)

## 2024-02-23 MED ORDER — SODIUM CHLORIDE 0.9 % IV SOLN
1.5000 g | Freq: Three times a day (TID) | INTRAVENOUS | Status: DC
  Administered 2024-02-24: 1.5 g via INTRAVENOUS
  Filled 2024-02-23: qty 4
  Filled 2024-02-23: qty 1.5
  Filled 2024-02-23: qty 4

## 2024-02-23 NOTE — Assessment & Plan Note (Signed)
 Chronic respiratory superiorly secondary to history COPD on 4 L at baseline

## 2024-02-23 NOTE — H&P (Incomplete)
 [name] FMW:[id number] DOB: 06/01/1956 DOA: 02/23/2024    PCP: Arloa Elsie SAUNDERS, MD   Outpatient Specialists:     Pulmonary    Dr. Neda Jennet LABOR, MD    Patient arrived to ER on 02/23/24 at 2002 Referred by Attending Randol Simmonds, MD   Patient coming from:    home Lives  With family    Chief Complaint:   Chief Complaint  Patient presents with   Altered Mental Status    HPI: [name] is a 68 y.o. 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 flu requiring previous intubation with tracheostomy   Presented with   increased somnolence Pt had a fall 1 wk ago  Has been more confused  At baseline on 4L FIO2 Had uncontrolled nasal bleed and now has rhino rocket Unable to get her needed O2 Has been hard to wake up today  On EMS arrival pt is disoriented to to time and situation     In the past was admitted to ICU for hypercarbic respiratory failure Decreased PO intake over the past few days Only ha a a few sips  After a recent fall she ended up with a nasal fracture will need to follow up with ENT  She is on Plavix  since her troponin were elevated during last admit  She has not had a cardiac cath but she stayed on Plavix  Her hearing has also been affected in hte past when her CO2 was up she lost hearing  When this happened again family suspected that she was hypercarbic   Denies significant ETOH intake   Does not smoke     Regarding pertinent Chronic problems:    Hyperlipidemia -  on statins crestor  Lipid Panel     Component Value Date/Time   CHOL 187 04/05/2020 0644   TRIG 128 04/05/2020 0644   HDL 30 (L) 04/05/2020 0644   CHOLHDL 6.2 04/05/2020 0644   VLDL 26 04/05/2020 0644   LDLCALC 131 (H) 04/05/2020 0644     HTN on carvedilol  12.5 mg p.o. twice daily, losartan  25 mg p.o.    chronic CHF diastolic - last echo  Recent Results (from the past 56199 hours)  ECHOCARDIOGRAM COMPLETE   Collection Time: 09/21/22   1:38 PM  Result Value   Weight 4,345.71   BP 131/98   Single Plane A2C EF 51.5   Single Plane A4C EF 57.5   Calc EF 57.6   S' Lateral 3.00   Area-P 1/2 2.76   Est EF 60 - 65%   Narrative      ECHOCARDIOGRAM REPORT       1. Left ventricular ejection fraction, by estimation, is 60 to 65%. The left ventricle has normal function. The left ventricle has no regional wall motion abnormalities. There is moderate left ventricular hypertrophy. Left ventricular diastolic  parameters are consistent with Grade I diastolic dysfunction (impaired relaxation).  2. Right ventricular systolic function is normal. The right ventricular size is normal.  3. The mitral valve is normal in structure. No evidence of mitral valve regurgitation. No evidence of mitral stenosis. Severe mitral annular calcification.  4. The aortic valve is calcified. There is mild calcification of the aortic valve. There is mild thickening of the aortic valve. Aortic valve regurgitation is not visualized. Aortic valve sclerosis is present, with no evidence of aortic valve stenosis.  5. The inferior vena cava is dilated in size with <50% respiratory variability,  suggesting right atrial pressure of 15 mmHg.              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  AKI.  NO known prior history of liver problems per family.  No alcohol use [JK]    Clinical Course User Index [JK] Randol Simmonds, MD       Lab Orders         Comprehensive metabolic panel         CBC with Differential/Platelet         Urinalysis, Routine w reflex microscopic -Urine, Clean Catch         Ethanol         Rapid urine drug screen (hospital performed)         Ammonia         CK         Protime-INR         Acetaminophen  level         Vitamin B12         Folate         Iron and TIBC         Ferritin         Reticulocytes         Lactic acid, plasma         Procalcitonin         Blood gas, venous         Magnesium         Phosphorus         Creatinine, urine, random         Osmolality, urine         Osmolality         Sodium, urine, random         Prealbumin         Hepatitis panel, acute         TSH         Urinalysis, Complete w Microscopic -Urine, Clean Catch         I-Stat venous blood gas, ED         CBG monitoring, ED      CT HEAD  1. No acute intracranial abnormality. 2. Partially visualized acute bilateral paranasal bone fractures. Consider CT max face for further evaluation. 3. Right mastoid air cell partial effusion and fluid within the right middle ear.    CXR -  Low lung volumes and cardiomegaly. Limited evaluation due to overlapping osseous structures and overlying soft tissues. Mild pulmonary edema with likely bilateral pleural effusions. 2. Recommend repeat chest x-ray with PA and lateral views for further  evaluation.  CTabd/pelvis - orderd   Following Medications were ordered in ER: Medications - No data to display  ____     ED Triage Vitals  Encounter Vitals Group     BP 02/23/24 2014 133/77     Girls Systolic BP Percentile --      Girls Diastolic BP Percentile --      Boys Systolic BP Percentile --      Boys Diastolic BP Percentile --      Pulse Rate 02/23/24 2014 79     Resp 02/23/24 2014 20     Temp  02/23/24 2014 97.7 F (36.5 C)     Temp Source 02/23/24 2014 Oral     SpO2 02/23/24 2014 100 %     Weight 02/23/24 2017 275 lb 9.2 oz (125 kg)     Height 02/23/24 2017 5' 4 (1.626 m)     Head Circumference --      Peak Flow --      Pain Score --      Pain Loc --      Pain Education --      Exclude from Growth Chart --   UFJK(75)@     _________________________________________ Significant initial  Findings: Abnormal Labs Reviewed  COMPREHENSIVE METABOLIC PANEL WITH GFR - Abnormal; Notable for the following components:      Result Value   Glucose, Bld 136 (*)    BUN 38 (*)    Creatinine, Ser 2.34 (*)    Total Protein 6.1 (*)    Albumin 3.2 (*)    AST 1,714 (*)    ALT 1,607 (*)    GFR, Estimated 22 (*)    All other components within normal limits  CBC WITH DIFFERENTIAL/PLATELET - Abnormal; Notable for the following components:   Hemoglobin 11.2 (*)    HCT 35.9 (*)    Neutro Abs 9.1 (*)    Lymphs Abs 0.6 (*)    All other components within normal limits  I-STAT VENOUS BLOOD GAS, ED - Abnormal; Notable for the following components:   pH, Ven 7.239 (*)    pCO2, Ven 75.8 (*)    pO2, Ven 31 (*)    Bicarbonate 32.4 (*)    TCO2 35 (*)    Acid-Base Excess 3.0 (*)    HCT 33.0 (*)    Hemoglobin 11.2 (*)    All other components within normal limits  CBG MONITORING, ED - Abnormal; Notable for the following components:   Glucose-Capillary 111 (*)    All other components within normal limits      _________________________ Troponin ***ordered Cardiac Panel (last 3 results) No results for input(s): CKTOTAL, CKMB, TROPONINIHS, RELINDX in the last 72 hours.   ECG: Ordered Personally reviewed and interpreted by me showing: HR : 79 Rhythm:Sinus rhythm Supraventricular bigeminy , new since last tracing Repol abnrm suggests ischemia, lateral leads QTC 428    The recent clinical data is shown below. Vitals:   02/23/24 2014 02/23/24 2017 02/23/24 2134  BP: 133/77     Pulse: 79    Resp: 20    Temp: 97.7 F (36.5 C)    TempSrc: Oral    SpO2: 100%  100%  Weight:  125 kg   Height:  5' 4 (1.626 m)       WBC     Component Value Date/Time   WBC 10.4 02/23/2024 2028   LYMPHSABS 0.6 (L) 02/23/2024 2028   LYMPHSABS 1.6 09/11/2013 1145   MONOABS 0.6 02/23/2024 2028  EOSABS 0.0 02/23/2024 2028   EOSABS 0.2 09/11/2013 1145   BASOSABS 0.0 02/23/2024 2028   BASOSABS 0.0 09/11/2013 1145      Procalcitonin   Ordered      UA   ordered     ________________________________________________________________   Venous  Blood Gas result:  pH  7.239 Low  Sodium 140 mmol/L   pCO2, Ven 75.8 High Panic  mmHg Potassium 4.7 mmol/L  pO2, Ven 31 Low Panic  mmHg       __________________________________________________________ Recent Labs  Lab 02/22/24 1947 02/23/24 2028 02/23/24 2049  NA 142 141 140  K 4.5 4.9 4.7  CO2 30 31  --   GLUCOSE 130* 136*  --   BUN 15 38*  --   CREATININE 0.60 2.34*  --   CALCIUM  8.8* 8.9  --     Cr    Up from baseline see below Lab Results  Component Value Date   CREATININE 2.34 (H) 02/23/2024   CREATININE 0.60 02/22/2024   CREATININE 1.04 (H) 09/24/2022    Recent Labs  Lab 02/22/24 1947 02/23/24 2028  AST 18 1,714*  ALT 12 1,607*  ALKPHOS 65 63  BILITOT 1.2 0.8  PROT 6.3* 6.1*  ALBUMIN 3.3* 3.2*   Lab Results  Component Value Date   CALCIUM  8.9 02/23/2024   PHOS 1.8 (L) 09/22/2022    Plt: Lab Results  Component Value Date   PLT 183 02/23/2024       Recent Labs  Lab 02/22/24 1947 02/23/24 2028 02/23/24 2049  WBC 7.8 10.4  --   NEUTROABS 6.3 9.1*  --   HGB 12.1 11.2* 11.2*  HCT 38.9 35.9* 33.0*  MCV 91.3 92.1  --   PLT 237 183  --     HG/HCT  stable,       Component Value Date/Time   HGB 11.2 (L) 02/23/2024 2049   HGB 13.9 09/11/2013 1145   HCT 33.0 (L) 02/23/2024 2049   HCT 41.9 09/11/2013 1145   MCV 92.1 02/23/2024 2028   MCV 83 09/11/2013 1145     _______________________________________________ Hospitalist was called for admission for   Respiratory acidosis  Elevated liver enzymes  AKI (acute kidney injury) (HCC)    The following Work up has been ordered so far:  Orders Placed This Encounter  Procedures   DG Chest 1 View   CT HEAD WO CONTRAST   US  Abdomen Limited RUQ (LIVER/GB)   Comprehensive metabolic panel   CBC with Differential/Platelet   Urinalysis, Routine w reflex microscopic -Urine, Clean Catch   Ethanol   Rapid urine drug screen (hospital performed)   Ammonia   CK   Protime-INR   Acetaminophen  level   Vitamin B12   Folate   Iron and TIBC   Ferritin   Reticulocytes   Lactic acid, plasma   Procalcitonin   Blood gas, venous   Magnesium   Phosphorus   Creatinine, urine, random   Osmolality, urine   Osmolality   Sodium, urine, random   Prealbumin   Hepatitis panel, acute   TSH   Urinalysis, Complete w Microscopic -Urine, Clean Catch   ED Cardiac monitoring   Initiate Carrier Fluid Protocol   Strict intake and output   If Cath X 3 Or Residual >300 Place Foley   Bladder scan   Consult to hospitalist   ED Pulse oximetry, continuous   Bipap   I-Stat venous blood gas, ED   CBG monitoring, ED   EKG 12-Lead   ED  EKG   Insert peripheral IV   Insert peripheral IV     OTHER Significant initial  Findings:  labs showing:     DM  labs:  HbA1C: No results for input(s): HGBA1C in the last 8760 hours.     CBG (last 3)  Recent Labs    02/23/24 2021  GLUCAP 111*          Cultures:    Component Value Date/Time   SDES ABSCESS RIGHT SCAPULA 10/13/2012 1640   SPECREQUEST NONE 10/13/2012 1640   CULT NO GROWTH 3 DAYS 10/13/2012 1640   REPTSTATUS 10/16/2012 FINAL 10/13/2012 1640     Radiological Exams on Admission: CT HEAD WO CONTRAST Result Date: 02/23/2024 CLINICAL DATA:  Head trauma, minor (Age >= 65y) hx recent fall, diagnosed with nasal fracture at another facility, now with decreased  mental status EXAM: CT HEAD WITHOUT CONTRAST TECHNIQUE: Contiguous axial images were obtained from the base of the skull through the vertex without intravenous contrast. RADIATION DOSE REDUCTION: This exam was performed according to the departmental dose-optimization program which includes automated exposure control, adjustment of the mA and/or kV according to patient size and/or use of iterative reconstruction technique. COMPARISON:  None Available. FINDINGS: Brain: No evidence of large-territorial acute infarction. No parenchymal hemorrhage. No mass lesion. No extra-axial collection. No mass effect or midline shift. No hydrocephalus. Basilar cisterns are patent. Vascular: No hyperdense vessel. Skull: No acute fracture or focal lesion. Partially visualized acute bilateral paranasal bone fractures. Sinuses/Orbits: Bilateral maxillary and right sphenoid sinus mucosal thickening. Right mastoid air cell partial effusion and fluid within the right middle ear. Debris within the right nasal cavity. Paranasal sinuses and left mastoid air cells are clear. The orbits are unremarkable. Other: None. IMPRESSION: 1. No acute intracranial abnormality. 2. Partially visualized acute bilateral paranasal bone fractures. Consider CT max face for further evaluation. 3. Right mastoid air cell partial effusion and fluid within the right middle ear. Electronically Signed   By: Morgane  Naveau M.D.   On: 02/23/2024 22:21   DG Chest 1 View Result Date: 02/23/2024 CLINICAL DATA:  ALTERED MENTAL STATUS Reason for exam: Lethargic, Disoriented, SHOB EXAM: CHEST  1 VIEW COMPARISON:  Chest x-ray 09/20/2022 FINDINGS: Limited evaluation due to overlapping osseous structures and overlying soft tissues. The heart and mediastinal contours are unchanged. Low lung volumes. No focal consolidation. Mild pulmonary edema. Likely bilateral pleural effusion. No pneumothorax. No acute osseous abnormality. IMPRESSION: 1. Low lung volumes and cardiomegaly.  Limited evaluation due to overlapping osseous structures and overlying soft tissues. Mild pulmonary edema with likely bilateral pleural effusions. 2. Recommend repeat chest x-ray with PA and lateral views for further evaluation. Electronically Signed   By: Morgane  Naveau M.D.   On: 02/23/2024 21:44   _______________________________________________________________________________________________________ Latest  Blood pressure 133/77, pulse 79, temperature 97.7 F (36.5 C), temperature source Oral, resp. rate 20, height 5' 4 (1.626 m), weight 125 kg, SpO2 100%.   Vitals  labs and radiology finding personally reviewed  Review of Systems:    Pertinent positives include:  somnolence  Constitutional:  No weight loss, night sweats, Fevers, chills, fatigue, weight loss  HEENT:  No headaches, Difficulty swallowing,Tooth/dental problems,Sore throat,  No sneezing, itching, ear ache, nasal congestion, post nasal drip,  Cardio-vascular:  No chest pain, Orthopnea, PND, anasarca, dizziness, palpitations.no Bilateral lower extremity swelling  GI:  No heartburn, indigestion, abdominal pain, nausea, vomiting, diarrhea, change in bowel habits, loss of appetite, melena, blood in stool, hematemesis Resp:  no shortness of breath at  rest. No dyspnea on exertion, No excess mucus, no productive cough, No non-productive cough, No coughing up of blood.No change in color of mucus.No wheezing. Skin:  no rash or lesions. No jaundice GU:  no dysuria, change in color of urine, no urgency or frequency. No straining to urinate.  No flank pain.  Musculoskeletal:  No joint pain or no joint swelling. No decreased range of motion. No back pain.  Psych:  No change in mood or affect. No depression or anxiety. No memory loss.  Neuro: no localizing neurological complaints, no tingling, no weakness, no double vision, no gait abnormality, no slurred speech, no confusion  All systems reviewed and apart from HOPI all are  negative _______________________________________________________________________________________________ Past Medical History:   Past Medical History:  Diagnosis Date   DVT (deep venous thrombosis) (HCC)    H1N1 influenza    Hypertension    Morbid obesity (HCC)    Seasonal depression (HCC)     Past Surgical History:  Procedure Laterality Date   CYST EXCISION  1980   Spinal cyst   KNEE ARTHROSCOPY     bilateral   TONSILLECTOMY  1980    Social History:  Ambulatory   independently      reports that she has never smoked. She has never used smokeless tobacco. She reports that she does not drink alcohol and does not use drugs.     Family History:   Family History  Problem Relation Age of Onset   Heart disease Father    Diabetes Father    Hypertension Mother    Arthritis Mother    Hypertension Brother    Arthritis Maternal Grandmother    ______________________________________________________________________________________________ Allergies: Allergies  Allergen Reactions   Aspirin Nausea And Vomiting   Hydrocodone-Acetaminophen  Other (See Comments)   Sulfa Antibiotics Nausea Only     Prior to Admission medications   Medication Sig Start Date End Date Taking? Authorizing Provider  amoxicillin -clavulanate (AUGMENTIN ) 875-125 MG tablet Take 1 tablet by mouth every 12 (twelve) hours for 5 days. 02/22/24 02/27/24  Freddi Hamilton, MD  carvedilol  (COREG ) 12.5 MG tablet Take 1 tablet (12.5 mg total) by mouth 2 (two) times daily with a meal. 09/24/22 12/23/22  [place], Camellia PARAS, DO  losartan  (COZAAR ) 25 MG tablet Take 1 tablet (25 mg total) by mouth daily. 09/24/22 12/23/22  [place], Camellia PARAS, DO  rosuvastatin  (CRESTOR ) 20 MG tablet Take 1 tablet (20 mg total) by mouth daily. 09/24/22 12/23/22  [place], Camellia PARAS, DO  sertraline  (ZOLOFT ) 100 MG tablet Take 100 mg by mouth daily. 08/03/22   [provider]     ___________________________________________________________________________________________________ Physical Exam:    02/23/2024    8:17 PM 02/23/2024    8:14 PM 02/22/2024    9:30 PM  Vitals with BMI  Height 5' 4    Weight 275 lbs 9 oz    BMI 47.28    Systolic  133 154  Diastolic  77 86  Pulse  79 78     1. General:  in No  Acute distress    Chronically ill  -appearing 2. Psychological: Alert and   Oriented to self 3. Head/ENT:   Dry Mucous Membranes                          Head Non traumatic, neck supple                         Poor Dentition  4. SKIN:  decreased Skin turgor,  Skin clean Dry and intact no rash    5. Heart: Regular rate and rhythm no*** Murmur, no Rub or gallop 6. Lungs  no wheezes or crackles  on BIPAP 7. Abdomen: Soft,  non-tender, Non distended   obese  bowel sounds present 8. Lower extremities: no clubbing, cyanosis, no ***edema 9. Neurologically Grossly intact, moving all 4 extremities equally   10. MSK: Normal range of motion    Chart has been reviewed  ______________________________________________________________________________________________  Assessment/Plan  ***  Admitted for  acute respiratory failure with hypoxia and hypercarbia Respiratory acidosis  Elevated liver enzymes    AKI      Present on Admission: **None**     No problem-specific Assessment & Plan notes found for this encounter.    Other plan as per orders.  DVT prophylaxis:  SCD      Code Status:    Code Status: Prior FULL CODE  as per patient   I had personally discussed CODE STATUS with patient and family   ACP   none    Family Communication:   Family  at  Bedside  plan of care was discussed  with  grand Daughter,   Diet  npo   Disposition Plan:      To home once workup is complete and patient is stable   Following barriers for discharge:                                                        Electrolytes corrected                                Anemia corrected h/H stable                                                       Will need consultants to evaluate patient prior to discharge                           Work of breathing improves       Consult Orders  (From admission, onward)           Start     Ordered   02/23/24 2253  Consult to hospitalist  Paged by lonell  Once       Provider:  (Not yet assigned)  Question Answer Comment  Place call to: [facility]   Reason for Consult Admit      02/23/24 2252                               Would benefit from PT/OT eval prior to DC  Ordered                                      Nutrition    consulted  Consults called: ***    Send msg to Pulmonoloygy  Send msg to GI  Admission status:  ED Disposition     ED Disposition  Admit   Condition  --   Comment  Hospital [facility] [[id number]]  Level of Care: Progressive [102]  Admit to Progressive based on following criteria: NEUROLOGICAL AND NEUROSURGICAL complex patients with significant risk of instability, who do not meet ICU criteria, yet require close observation or frequent assessment (< / = every 2 - 4 hours) with medical / nursing intervention.  Admit to Progressive based on following criteria: NEPHROLOGY stable condition requiring close monitoring for AKI, requiring Hemodialysis or Peritoneal Dialysis either from expected electrolyte imbalance, acidosis, or fluid overload that can be managed by NIPPV or high flow oxygen.  May admit patient to [facility] or [facility] if equivalent level of care is available:: No  Covid Evaluation: Asymptomatic - no recent exposure (last 10 days) testing not required  Diagnosis: AKI (acute kidney injury) [facility]) [[id number]]  Admitting Physician: Meriah Shands [[id number]]  Attending Physician: Kaleiah Kutzer [[id number]]  Certification:: I certify this patient will need inpatient services for at least 2 midnights  Expected  Medical Readiness: 02/26/2024             inpatient     I Expect 2 midnight stay secondary to severity of patient's current illness need for inpatient interventions justified by the following:  hemodynamic instability despite optimal treatment ( hypotension hypoxia, hypercapnia)   Severe lab/radiological/exam abnormalities including:    Respiratory acidosis  Elevated liver enzymes    AKI    and extensive comorbidities including:  CHF  COPD/asthma  Morbid Obesity  That are currently affecting medical management.   I expect  patient to be hospitalized for 2 midnights requiring inpatient medical care.  Patient is at high risk for adverse outcome (such as loss of life or disability) if not treated.  Indication for inpatient stay as follows:  Severe change from baseline regarding mental status Hemodynamic instability despite maximal medical therapy,  severe pain requiring acute inpatient management,  inability to maintain oral hydration   persistent chest pain despite medical management Need for operative/procedural  intervention New or worsening hypoxia ongoing suicidal ideations   Need for IV antibiotics, IV fluids,, IV pain medications, IV anticoagulation,  IV rate controling medications, IV antihypertensives need for biPAP Frequent labs    Level of care     progressive        Critical***  Patient is critically ill due to  hemodynamic instability * respiratory failure *severe sepsis* ongoing chest pain*  They are at high risk for life/limb threatening clinical deterioration requiring frequent reassessment and modifications of care.  Services provided include examination of the patient, review of relevant ancillary tests, prescription of lifesaving therapies, review of medications and prophylactic therapy.  Total critical care time excluding separately billable procedures: 60*  Minutes.    Harim Bi 02/23/2024, 11:12 PM ***  [facility]     after 2  AM please page floor coverage   If 7AM-7PM, please contact the day team taking care of the patient using [email or web address]

## 2024-02-23 NOTE — ED Triage Notes (Signed)
 Pt had a fall x 1 week ago and is on plavix . She was seen at [facility] for fall. Pt is now having altered mental status. Yesterday pt had uncontrolled nose bleed and is normally on 4 lpm at home but because of the rhino rocket pt is unable to get O2 needed. Family states that pt has been very lethargic and hard to wake today, weak and immobile- not pt baseline. Pt is now having difficulty hearing and that is also not baseline. EMS report that pt is disoriented to time and situation stating that it was 2087 and that she needed to go work third shift.   BP 130/60 HR 70s Spo2 - 94-98 on 8 LPM via NRB 18 g Lac

## 2024-02-23 NOTE — Subjective & Objective (Signed)
 Pt had a fall 1 wk ago  Has been more confused  At baseline on 4L FIO2 Had uncontrolled nasal bleed and now has rhino rocket Unable to get her needed O2 Has been hard to wake up today  On EMS arrival pt is disoriented to to time and situation

## 2024-02-23 NOTE — ED Provider Notes (Signed)
 [place] EMERGENCY DEPARTMENT AT [facility] Provider Note   CSN: [id number] Arrival date & time: 02/23/24  2002     Patient presents with: Altered Mental Status   [name] is a 68 y.o. female.  {Add pertinent medical, surgical, social history, OB history to YEP:67052}  Altered Mental Status    Patient has a history of hypertension DVT and obesity.  Patient is not on anticoagulation.  Patient does take Plavix .  Patient fell a few days ago.  She was seen at another emergency room on July 15.  Records reviewed from that visit.  Patient had CT scans of her head face and cervical spine.  She was diagnosed with nasal fracture.  Patient had some return of nasal bleeding and had to return to the ER yesterday.  She was seen here and had nasal packing performed.  Patient presented to the ED today because of lethargy and confusion.  Family noted that patient was hard for the patient to wake up today.  She was having difficulty with her hearing now.  In the ED the patient is able to tell me the date and her name but she is having difficulty telling her what specifically happened today that brought her to the ED.  She is able to tell me about the previous events that occurred including her fall and return visit to the ED.  Patient is also having difficulty hearing which is new   Prior to Admission medications   Medication Sig Start Date End Date Taking? Authorizing Provider  amoxicillin -clavulanate (AUGMENTIN ) 875-125 MG tablet Take 1 tablet by mouth every 12 (twelve) hours for 5 days. 02/22/24 02/27/24  Freddi Hamilton, MD  carvedilol  (COREG ) 12.5 MG tablet Take 1 tablet (12.5 mg total) by mouth 2 (two) times daily with a meal. 09/24/22 12/23/22  [place], Camellia PARAS, DO  losartan  (COZAAR ) 25 MG tablet Take 1 tablet (25 mg total) by mouth daily. 09/24/22 12/23/22  [place], Camellia PARAS, DO  rosuvastatin  (CRESTOR ) 20 MG tablet Take 1 tablet (20 mg total) by mouth daily. 09/24/22 12/23/22  [place], Eric J, DO   sertraline  (ZOLOFT ) 100 MG tablet Take 100 mg by mouth daily. 08/03/22   [provider]    Allergies: Aspirin, Hydrocodone-acetaminophen , and Sulfa antibiotics    Review of Systems  Updated Vital Signs BP 133/77   Pulse 79   Temp 97.7 F (36.5 C) (Oral)   Resp 20   Ht 1.626 m (5' 4)   Wt 125 kg   SpO2 100%   BMI 47.30 kg/m   Physical Exam Vitals and nursing note reviewed.  Constitutional:      General: She is not in acute distress.    Appearance: She is well-developed.  HENT:     Head: Normocephalic.     Comments: Periorbital ecchymoses, nasal packing in the right nares, no bleeding noted    Right Ear: External ear normal. There is hemotympanum.     Left Ear: External ear normal.  Eyes:     General: No scleral icterus.       Right eye: No discharge.        Left eye: No discharge.     Conjunctiva/sclera: Conjunctivae normal.  Neck:     Trachea: No tracheal deviation.  Cardiovascular:     Rate and Rhythm: Normal rate and regular rhythm.  Pulmonary:     Effort: Pulmonary effort is normal. No respiratory distress.     Breath sounds: Normal breath sounds. No stridor. No wheezing  or rales.  Abdominal:     General: Bowel sounds are normal. There is no distension.     Palpations: Abdomen is soft.     Tenderness: There is no abdominal tenderness. There is no guarding or rebound.  Musculoskeletal:        General: No tenderness or deformity.     Cervical back: Neck supple.  Skin:    General: Skin is warm and dry.     Findings: No rash.  Neurological:     General: No focal deficit present.     Mental Status: She is alert.     Cranial Nerves: No cranial nerve deficit, dysarthria or facial asymmetry.     Sensory: No sensory deficit.     Motor: No weakness, abnormal muscle tone or seizure activity.     Coordination: Coordination normal.  Psychiatric:        Mood and Affect: Mood normal.     (all labs ordered are listed, but only abnormal results are  displayed) Labs Reviewed  COMPREHENSIVE METABOLIC PANEL WITH GFR - Abnormal; Notable for the following components:      Result Value   Glucose, Bld 136 (*)    BUN 38 (*)    Creatinine, Ser 2.34 (*)    Total Protein 6.1 (*)    Albumin 3.2 (*)    AST 1,714 (*)    ALT 1,607 (*)    GFR, Estimated 22 (*)    All other components within normal limits  CBC WITH DIFFERENTIAL/PLATELET - Abnormal; Notable for the following components:   Hemoglobin 11.2 (*)    HCT 35.9 (*)    Neutro Abs 9.1 (*)    Lymphs Abs 0.6 (*)    All other components within normal limits  I-STAT VENOUS BLOOD GAS, ED - Abnormal; Notable for the following components:   pH, Ven 7.239 (*)    pCO2, Ven 75.8 (*)    pO2, Ven 31 (*)    Bicarbonate 32.4 (*)    TCO2 35 (*)    Acid-Base Excess 3.0 (*)    HCT 33.0 (*)    Hemoglobin 11.2 (*)    All other components within normal limits  CBG MONITORING, ED - Abnormal; Notable for the following components:   Glucose-Capillary 111 (*)    All other components within normal limits  ETHANOL  URINALYSIS, ROUTINE W REFLEX MICROSCOPIC  RAPID URINE DRUG SCREEN, HOSP PERFORMED    EKG: EKG Interpretation Date/Time:  Sunday February 23 2024 20:15:24 EDT Ventricular Rate:  79 PR Interval:  191 QRS Duration:  97 QT Interval:  405 QTC Calculation: 428 R Axis:   33  Text Interpretation: Sinus rhythm Supraventricular bigeminy , new since last tracing Repol abnrm suggests ischemia, lateral leads Confirmed by Randol Simmonds [phone]) on 02/23/2024 8:25:32 PM  Radiology: CT HEAD WO CONTRAST Result Date: 02/23/2024 CLINICAL DATA:  Head trauma, minor (Age >= 65y) hx recent fall, diagnosed with nasal fracture at another facility, now with decreased mental status EXAM: CT HEAD WITHOUT CONTRAST TECHNIQUE: Contiguous axial images were obtained from the base of the skull through the vertex without intravenous contrast. RADIATION DOSE REDUCTION: This exam was performed according to the departmental  dose-optimization program which includes automated exposure control, adjustment of the mA and/or kV according to patient size and/or use of iterative reconstruction technique. COMPARISON:  None Available. FINDINGS: Brain: No evidence of large-territorial acute infarction. No parenchymal hemorrhage. No mass lesion. No extra-axial collection. No mass effect or midline shift. No hydrocephalus. Basilar cisterns  are patent. Vascular: No hyperdense vessel. Skull: No acute fracture or focal lesion. Partially visualized acute bilateral paranasal bone fractures. Sinuses/Orbits: Bilateral maxillary and right sphenoid sinus mucosal thickening. Right mastoid air cell partial effusion and fluid within the right middle ear. Debris within the right nasal cavity. Paranasal sinuses and left mastoid air cells are clear. The orbits are unremarkable. Other: None. IMPRESSION: 1. No acute intracranial abnormality. 2. Partially visualized acute bilateral paranasal bone fractures. Consider CT max face for further evaluation. 3. Right mastoid air cell partial effusion and fluid within the right middle ear. Electronically Signed   By: Morgane  Naveau M.D.   On: 02/23/2024 22:21   DG Chest 1 View Result Date: 02/23/2024 CLINICAL DATA:  ALTERED MENTAL STATUS Reason for exam: Lethargic, Disoriented, SHOB EXAM: CHEST  1 VIEW COMPARISON:  Chest x-ray 09/20/2022 FINDINGS: Limited evaluation due to overlapping osseous structures and overlying soft tissues. The heart and mediastinal contours are unchanged. Low lung volumes. No focal consolidation. Mild pulmonary edema. Likely bilateral pleural effusion. No pneumothorax. No acute osseous abnormality. IMPRESSION: 1. Low lung volumes and cardiomegaly. Limited evaluation due to overlapping osseous structures and overlying soft tissues. Mild pulmonary edema with likely bilateral pleural effusions. 2. Recommend repeat chest x-ray with PA and lateral views for further evaluation. Electronically Signed    By: Morgane  Naveau M.D.   On: 02/23/2024 21:44    {Document cardiac monitor, telemetry assessment procedure when appropriate:32947} Procedures   Medications Ordered in the ED - No data to display  Clinical Course as of 02/23/24 2311  Sun Feb 23, 2024  2051 Venous blood gas shows pH 7.2 pCO2 elevated at 75.8 [JK]  2311 Case discussed with Dr Silvester [JK]  2311 Labs notable for increased lfts.  AKI.  NO known prior history of liver problems per family.  No alcohol use [JK]    Clinical Course User Index [JK] Randol Simmonds, MD   {Click here for ABCD2, HEART and other calculators REFRESH Note before signing:1}                              Medical Decision Making Problems Addressed: Respiratory acidosis: acute illness or injury that poses a threat to life or bodily functions  Amount and/or Complexity of Data Reviewed Labs: ordered. Decision-making details documented in ED Course. Radiology: ordered and independent interpretation performed.  Patient presented to the ED for evaluation of altered mental status.  Patient recently had a fall where she sustained nasal bone fractures.  Patient ended up requiring nasal packing.  Patient is chronically on oxygen.  Family noted increasing lethargy confusion today.  Was concerned about the possibility of subdural hematoma, cerebral hemorrhage.  Metabolic etiologies were also consideration.  Patient's head CT fortunately does not show any signs of acute injury.  Patient's laboratory test however did show findings consistent with respiratory acidosis.  Patient's laboratory tests also now show elevated BUN and creatinine.  She also has markedly elevated LFTs.   {Document critical care time when appropriate  Document review of labs and clinical decision tools ie CHADS2VASC2, etc  Document your independent review of radiology images and any outside records  Document your discussion with family members, caretakers and with consultants  Document social  determinants of health affecting pt's care  Document your decision making why or why not admission, treatments were needed:32947:::1}   Final diagnoses:  None    ED Discharge Orders     None

## 2024-02-23 NOTE — Assessment & Plan Note (Addendum)
 this patient has acute respiratory failure with Hypoxia and  Hypercarbia as documented by the presence of following:   pCO2 >50   Likely due to: Somnolence inability to use home oxygen Provide O2 therapy and titrate as needed  Continuous pulse ox   check Pulse ox with ambulation prior to discharge flutter valve ordered On BIPAP now mentation is improving repeat VBG If pt respiratory status worsens will need PCCM consult

## 2024-02-23 NOTE — Assessment & Plan Note (Signed)
 Will rehydrate obtain electrolytes Imaging to make sure there is no evidence of obstructive process The family endorses the patient have had decreased p.o. intake which could have contributed to AKI in the past patient have had similar presentation when her CO2 goes up she stops eating and drinking and that resulted in the past we have also AKI

## 2024-02-23 NOTE — Progress Notes (Signed)
Placed patient on bipap.

## 2024-02-23 NOTE — ED Notes (Signed)
 Nurse will collect labs.  KM

## 2024-02-23 NOTE — Assessment & Plan Note (Signed)
 Allow permissive hypertension for tonight

## 2024-02-24 ENCOUNTER — Inpatient Hospital Stay (HOSPITAL_COMMUNITY)

## 2024-02-24 DIAGNOSIS — N179 Acute kidney failure, unspecified: Secondary | ICD-10-CM | POA: Diagnosis not present

## 2024-02-24 DIAGNOSIS — Z4682 Encounter for fitting and adjustment of non-vascular catheter: Secondary | ICD-10-CM | POA: Diagnosis not present

## 2024-02-24 DIAGNOSIS — J449 Chronic obstructive pulmonary disease, unspecified: Secondary | ICD-10-CM | POA: Diagnosis not present

## 2024-02-24 DIAGNOSIS — I10 Essential (primary) hypertension: Secondary | ICD-10-CM | POA: Diagnosis not present

## 2024-02-24 DIAGNOSIS — R0603 Acute respiratory distress: Secondary | ICD-10-CM | POA: Diagnosis present

## 2024-02-24 DIAGNOSIS — R4182 Altered mental status, unspecified: Secondary | ICD-10-CM | POA: Diagnosis not present

## 2024-02-24 DIAGNOSIS — D631 Anemia in chronic kidney disease: Secondary | ICD-10-CM

## 2024-02-24 DIAGNOSIS — K5730 Diverticulosis of large intestine without perforation or abscess without bleeding: Secondary | ICD-10-CM | POA: Diagnosis not present

## 2024-02-24 DIAGNOSIS — S022XXA Fracture of nasal bones, initial encounter for closed fracture: Secondary | ICD-10-CM | POA: Diagnosis present

## 2024-02-24 DIAGNOSIS — J9621 Acute and chronic respiratory failure with hypoxia: Secondary | ICD-10-CM | POA: Diagnosis not present

## 2024-02-24 DIAGNOSIS — I517 Cardiomegaly: Secondary | ICD-10-CM | POA: Diagnosis not present

## 2024-02-24 DIAGNOSIS — R040 Epistaxis: Secondary | ICD-10-CM | POA: Diagnosis present

## 2024-02-24 DIAGNOSIS — R7989 Other specified abnormal findings of blood chemistry: Secondary | ICD-10-CM | POA: Diagnosis present

## 2024-02-24 DIAGNOSIS — N189 Chronic kidney disease, unspecified: Secondary | ICD-10-CM

## 2024-02-24 DIAGNOSIS — J811 Chronic pulmonary edema: Secondary | ICD-10-CM | POA: Diagnosis not present

## 2024-02-24 DIAGNOSIS — R7401 Elevation of levels of liver transaminase levels: Secondary | ICD-10-CM

## 2024-02-24 LAB — OSMOLALITY: Osmolality: 306 mosm/kg — ABNORMAL HIGH (ref 275–295)

## 2024-02-24 LAB — BLOOD GAS, VENOUS
Acid-Base Excess: 6.3 mmol/L — ABNORMAL HIGH (ref 0.0–2.0)
Bicarbonate: 36.9 mmol/L — ABNORMAL HIGH (ref 20.0–28.0)
O2 Saturation: 28.4 %
Patient temperature: 37
pCO2, Ven: 86 mmHg (ref 44–60)
pH, Ven: 7.24 — ABNORMAL LOW (ref 7.25–7.43)
pO2, Ven: <31 mmHg — CL (ref 32–45)

## 2024-02-24 LAB — PROTIME-INR
INR: 1.2 (ref 0.8–1.2)
Prothrombin Time: 15.8 s — ABNORMAL HIGH (ref 11.4–15.2)

## 2024-02-24 LAB — SODIUM, URINE, RANDOM: Sodium, Ur: 59 mmol/L

## 2024-02-24 LAB — GLUCOSE, CAPILLARY
Glucose-Capillary: 102 mg/dL — ABNORMAL HIGH (ref 70–99)
Glucose-Capillary: 84 mg/dL (ref 70–99)
Glucose-Capillary: 82 mg/dL (ref 70–99)
Glucose-Capillary: 87 mg/dL (ref 70–99)
Glucose-Capillary: 86 mg/dL (ref 70–99)
Glucose-Capillary: 74 mg/dL (ref 70–99)

## 2024-02-24 LAB — HEPATITIS PANEL, ACUTE
HCV Ab: NONREACTIVE
Hep A IgM: NONREACTIVE
Hep B C IgM: NONREACTIVE
Hepatitis B Surface Ag: NONREACTIVE

## 2024-02-24 LAB — POCT I-STAT 7, (LYTES, BLD GAS, ICA,H+H)
Acid-Base Excess: 3 mmol/L — ABNORMAL HIGH (ref 0.0–2.0)
Bicarbonate: 31.2 mmol/L — ABNORMAL HIGH (ref 20.0–28.0)
Calcium, Ion: 1.22 mmol/L (ref 1.15–1.40)
HCT: 32 % — ABNORMAL LOW (ref 36.0–46.0)
Hemoglobin: 10.9 g/dL — ABNORMAL LOW (ref 12.0–15.0)
O2 Saturation: 98 %
Patient temperature: 98.7
Potassium: 4.6 mmol/L (ref 3.5–5.1)
Sodium: 138 mmol/L (ref 135–145)
TCO2: 33 mmol/L — ABNORMAL HIGH (ref 22–32)
pCO2 arterial: 64.1 mmHg — ABNORMAL HIGH (ref 32–48)
pH, Arterial: 7.296 — ABNORMAL LOW (ref 7.35–7.45)
pO2, Arterial: 111 mmHg — ABNORMAL HIGH (ref 83–108)

## 2024-02-24 LAB — COMPREHENSIVE METABOLIC PANEL WITH GFR
ALT: 1915 U/L — ABNORMAL HIGH (ref 0–44)
AST: 2178 U/L — ABNORMAL HIGH (ref 15–41)
Albumin: 3.3 g/dL — ABNORMAL LOW (ref 3.5–5.0)
Alkaline Phosphatase: 63 U/L (ref 38–126)
Anion gap: 12 (ref 5–15)
BUN: 42 mg/dL — ABNORMAL HIGH (ref 8–23)
CO2: 30 mmol/L (ref 22–32)
Calcium: 8.8 mg/dL — ABNORMAL LOW (ref 8.9–10.3)
Chloride: 99 mmol/L (ref 98–111)
Creatinine, Ser: 2.34 mg/dL — ABNORMAL HIGH (ref 0.44–1.00)
GFR, Estimated: 22 mL/min — ABNORMAL LOW (ref >60)
Glucose, Bld: 128 mg/dL — ABNORMAL HIGH (ref 70–99)
Potassium: 5.1 mmol/L (ref 3.5–5.1)
Sodium: 141 mmol/L (ref 135–145)
Total Bilirubin: 1 mg/dL (ref 0.0–1.2)
Total Protein: 5.9 g/dL — ABNORMAL LOW (ref 6.5–8.1)

## 2024-02-24 LAB — URINALYSIS, COMPLETE (UACMP) WITH MICROSCOPIC
Bilirubin Urine: NEGATIVE
Glucose, UA: NEGATIVE mg/dL
Ketones, ur: NEGATIVE mg/dL
Leukocytes,Ua: NEGATIVE
Nitrite: NEGATIVE
Protein, ur: 100 mg/dL — AB
Specific Gravity, Urine: 1.016 (ref 1.005–1.030)
pH: 6 (ref 5.0–8.0)

## 2024-02-24 LAB — TSH: TSH: 2.476 u[IU]/mL (ref 0.350–4.500)

## 2024-02-24 LAB — PROCALCITONIN: Procalcitonin: 0.71 ng/mL

## 2024-02-24 LAB — CBC
HCT: 34.7 % — ABNORMAL LOW (ref 36.0–46.0)
HCT: 34.3 % — ABNORMAL LOW (ref 36.0–46.0)
Hemoglobin: 11 g/dL — ABNORMAL LOW (ref 12.0–15.0)
Hemoglobin: 10.9 g/dL — ABNORMAL LOW (ref 12.0–15.0)
MCH: 28.8 pg (ref 26.0–34.0)
MCH: 28.6 pg (ref 26.0–34.0)
MCHC: 31.7 g/dL (ref 30.0–36.0)
MCHC: 31.8 g/dL (ref 30.0–36.0)
MCV: 90.8 fL (ref 80.0–100.0)
MCV: 90 fL (ref 80.0–100.0)
Platelets: 184 K/uL (ref 150–400)
Platelets: 187 K/uL (ref 150–400)
RBC: 3.82 MIL/uL — ABNORMAL LOW (ref 3.87–5.11)
RBC: 3.81 MIL/uL — ABNORMAL LOW (ref 3.87–5.11)
RDW: 13.6 % (ref 11.5–15.5)
RDW: 13.6 % (ref 11.5–15.5)
WBC: 9.5 K/uL (ref 4.0–10.5)
WBC: 9.1 K/uL (ref 4.0–10.5)
nRBC: 0 % (ref 0.0–0.2)
nRBC: 0 % (ref 0.0–0.2)

## 2024-02-24 LAB — OSMOLALITY, URINE: Osmolality, Ur: 467 mosm/kg (ref 300–900)

## 2024-02-24 LAB — FERRITIN: Ferritin: 4178 ng/mL — ABNORMAL HIGH (ref 11–307)

## 2024-02-24 LAB — IRON AND TIBC
Iron: 64 ug/dL (ref 28–170)
Saturation Ratios: 17 % (ref 10.4–31.8)
TIBC: 381 ug/dL (ref 250–450)
UIBC: 317 ug/dL

## 2024-02-24 LAB — RESP PANEL BY RT-PCR (RSV, FLU A&B, COVID)  RVPGX2
Influenza A by PCR: NEGATIVE
Influenza B by PCR: NEGATIVE
Resp Syncytial Virus by PCR: NEGATIVE
SARS Coronavirus 2 by RT PCR: NEGATIVE

## 2024-02-24 LAB — LACTIC ACID, PLASMA
Lactic Acid, Venous: 1.2 mmol/L (ref 0.5–1.9)
Lactic Acid, Venous: 1.3 mmol/L (ref 0.5–1.9)

## 2024-02-24 LAB — CK: Total CK: 2042 U/L — ABNORMAL HIGH (ref 38–234)

## 2024-02-24 LAB — CREATININE, SERUM
Creatinine, Ser: 2.42 mg/dL — ABNORMAL HIGH (ref 0.44–1.00)
GFR, Estimated: 22 mL/min — ABNORMAL LOW (ref >60)

## 2024-02-24 LAB — RETICULOCYTES
Immature Retic Fract: 17.2 % — ABNORMAL HIGH (ref 2.3–15.9)
RBC.: 4.1 MIL/uL (ref 3.87–5.11)
Retic Count, Absolute: 101.7 K/uL (ref 19.0–186.0)
Retic Ct Pct: 2.5 % (ref 0.4–3.1)

## 2024-02-24 LAB — ACETAMINOPHEN LEVEL: Acetaminophen (Tylenol), Serum: <10 ug/mL — ABNORMAL LOW (ref 10–30)

## 2024-02-24 LAB — MAGNESIUM
Magnesium: 1.9 mg/dL (ref 1.7–2.4)
Magnesium: 1.9 mg/dL (ref 1.7–2.4)

## 2024-02-24 LAB — PHOSPHORUS
Phosphorus: 5.4 mg/dL — ABNORMAL HIGH (ref 2.5–4.6)
Phosphorus: 4.9 mg/dL — ABNORMAL HIGH (ref 2.5–4.6)

## 2024-02-24 LAB — RAPID URINE DRUG SCREEN, HOSP PERFORMED
Amphetamines: NOT DETECTED
Barbiturates: NOT DETECTED
Benzodiazepines: NOT DETECTED
Cocaine: NOT DETECTED
Opiates: NOT DETECTED
Tetrahydrocannabinol: NOT DETECTED

## 2024-02-24 LAB — BRAIN NATRIURETIC PEPTIDE: B Natriuretic Peptide: 661.8 pg/mL — ABNORMAL HIGH (ref 0.0–100.0)

## 2024-02-24 LAB — FOLATE: Folate: 16.6 ng/mL (ref >5.9)

## 2024-02-24 LAB — PREALBUMIN: Prealbumin: 22 mg/dL (ref 18–38)

## 2024-02-24 LAB — HIV ANTIBODY (ROUTINE TESTING W REFLEX): HIV Screen 4th Generation wRfx: NONREACTIVE

## 2024-02-24 LAB — MRSA NEXT GEN BY PCR, NASAL: MRSA by PCR Next Gen: NOT DETECTED

## 2024-02-24 LAB — CREATININE, URINE, RANDOM: Creatinine, Urine: 141 mg/dL

## 2024-02-24 LAB — AMMONIA: Ammonia: 43 umol/L — ABNORMAL HIGH (ref 9–35)

## 2024-02-24 LAB — VITAMIN B12: Vitamin B-12: 1277 pg/mL — ABNORMAL HIGH (ref 180–914)

## 2024-02-24 MED ORDER — ORAL CARE MOUTH RINSE: 15.0000 mL | OROMUCOSAL | Status: DC

## 2024-02-24 MED ORDER — ORAL CARE MOUTH RINSE
15.0000 mL | OROMUCOSAL | Status: DC | PRN
Start: 2024-02-24 — End: 2024-02-28

## 2024-02-24 MED ORDER — THIAMINE HCL 100 MG/ML IJ SOLN
100.0000 mg | Freq: Every day | INTRAMUSCULAR | Status: DC
Start: 2024-02-24 — End: 2024-02-28
  Administered 2024-02-24 – 2024-02-28 (×5): 100 mg via INTRAVENOUS
  Filled 2024-02-24 (×5): qty 2

## 2024-02-24 MED ORDER — FOLIC ACID 1 MG PO TABS: 1.0000 mg | ORAL_TABLET | Freq: Every day | ORAL | Status: DC

## 2024-02-24 MED ORDER — ORAL CARE MOUTH RINSE: 15.0000 mL | OROMUCOSAL | Status: DC | PRN

## 2024-02-24 MED ORDER — HEPARIN SODIUM (PORCINE) 5000 UNIT/ML IJ SOLN
5000.0000 [IU] | Freq: Three times a day (TID) | INTRAMUSCULAR | Status: DC
  Administered 2024-02-24 – 2024-02-28 (×13): 5000 [IU] via SUBCUTANEOUS
  Filled 2024-02-24 (×13): qty 1

## 2024-02-24 MED ORDER — POLYETHYLENE GLYCOL 3350 17 G PO PACK: 17.0000 g | PACK | Freq: Every day | ORAL | Status: DC | PRN

## 2024-02-24 MED ORDER — SODIUM CHLORIDE 0.9 % IV SOLN
3.0000 g | Freq: Three times a day (TID) | INTRAVENOUS | Status: DC
  Administered 2024-02-24 – 2024-02-25 (×4): 3 g via INTRAVENOUS
  Filled 2024-02-24 (×4): qty 8

## 2024-02-24 MED ORDER — ONDANSETRON HCL 4 MG PO TABS: 4.0000 mg | ORAL_TABLET | Freq: Four times a day (QID) | ORAL | Status: DC | PRN

## 2024-02-24 MED ORDER — ORAL CARE MOUTH RINSE
15.0000 mL | OROMUCOSAL | Status: DC | PRN
Start: 2024-02-24 — End: 2024-02-24

## 2024-02-24 MED ORDER — ETOMIDATE 2 MG/ML IV SOLN
INTRAVENOUS | Status: AC
  Administered 2024-02-24: 20 mg
  Filled 2024-02-24: qty 20

## 2024-02-24 MED ORDER — FAMOTIDINE 20 MG PO TABS: 20.0000 mg | ORAL_TABLET | Freq: Every day | ORAL | Status: DC

## 2024-02-24 MED ORDER — SENNA 8.6 MG PO TABS
1.0000 | ORAL_TABLET | Freq: Two times a day (BID) | ORAL | Status: DC
  Administered 2024-02-24: 8.6 mg
  Filled 2024-02-24: qty 1

## 2024-02-24 MED ORDER — DOCUSATE SODIUM 100 MG PO CAPS
100.0000 mg | ORAL_CAPSULE | Freq: Two times a day (BID) | ORAL | Status: DC
  Administered 2024-02-24 – 2024-02-26 (×4): 100 mg via ORAL
  Filled 2024-02-24 (×6): qty 1

## 2024-02-24 MED ORDER — MIDAZOLAM HCL 2 MG/2ML IJ SOLN
INTRAMUSCULAR | Status: AC
  Administered 2024-02-24: 2 mg
  Filled 2024-02-24: qty 2

## 2024-02-24 MED ORDER — ALBUTEROL SULFATE (2.5 MG/3ML) 0.083% IN NEBU: 2.5000 mg | INHALATION_SOLUTION | RESPIRATORY_TRACT | Status: DC | PRN

## 2024-02-24 MED ORDER — ADULT MULTIVITAMIN W/MINERALS CH
1.0000 | ORAL_TABLET | Freq: Every day | ORAL | Status: DC
  Administered 2024-02-24: 1 via ORAL
  Filled 2024-02-24: qty 1

## 2024-02-24 MED ORDER — FOLIC ACID 1 MG PO TABS
1.0000 mg | ORAL_TABLET | Freq: Every day | ORAL | Status: DC
  Administered 2024-02-24: 1 mg via ORAL
  Filled 2024-02-24: qty 1

## 2024-02-24 MED ORDER — FENTANYL CITRATE PF 50 MCG/ML IJ SOSY: 12.5000 ug | PREFILLED_SYRINGE | INTRAMUSCULAR | Status: DC | PRN

## 2024-02-24 MED ORDER — ORAL CARE MOUTH RINSE
15.0000 mL | OROMUCOSAL | Status: DC
  Administered 2024-02-24 (×6): 15 mL via OROMUCOSAL

## 2024-02-24 MED ORDER — SODIUM CHLORIDE 0.9 % IV SOLN: INTRAVENOUS | Status: AC

## 2024-02-24 MED ORDER — ADULT MULTIVITAMIN W/MINERALS CH: 1.0000 | ORAL_TABLET | Freq: Every day | ORAL | Status: DC

## 2024-02-24 MED ORDER — CHLORHEXIDINE GLUCONATE CLOTH 2 % EX PADS
6.0000 | MEDICATED_PAD | Freq: Every day | CUTANEOUS | Status: DC
  Administered 2024-02-24 – 2024-02-26 (×3): 6 via TOPICAL

## 2024-02-24 MED ORDER — SENNA 8.6 MG PO TABS
1.0000 | ORAL_TABLET | Freq: Two times a day (BID) | ORAL | Status: DC
  Administered 2024-02-24: 8.6 mg via ORAL
  Filled 2024-02-24: qty 1

## 2024-02-24 MED ORDER — ONDANSETRON HCL 4 MG/2ML IJ SOLN: 4.0000 mg | Freq: Four times a day (QID) | INTRAMUSCULAR | Status: DC | PRN

## 2024-02-24 MED ORDER — DOCUSATE SODIUM 100 MG PO CAPS
100.0000 mg | ORAL_CAPSULE | Freq: Two times a day (BID) | ORAL | Status: DC | PRN
Start: 2024-02-24 — End: 2024-02-28

## 2024-02-24 MED ORDER — ROCURONIUM BROMIDE 10 MG/ML (PF) SYRINGE
PREFILLED_SYRINGE | INTRAVENOUS | Status: AC
  Administered 2024-02-24: 50 mg
  Filled 2024-02-24: qty 10

## 2024-02-24 MED ORDER — FAMOTIDINE 20 MG PO TABS
20.0000 mg | ORAL_TABLET | Freq: Two times a day (BID) | ORAL | Status: DC
  Administered 2024-02-24: 20 mg
  Filled 2024-02-24: qty 1

## 2024-02-24 MED ORDER — PROPOFOL 1000 MG/100ML IV EMUL
0.0000 ug/kg/min | INTRAVENOUS | Status: DC
  Administered 2024-02-24: 10 ug/kg/min via INTRAVENOUS
  Filled 2024-02-24: qty 100

## 2024-02-24 MED ORDER — PHENYLEPHRINE 80 MCG/ML (10ML) SYRINGE FOR IV PUSH (FOR BLOOD PRESSURE SUPPORT)
PREFILLED_SYRINGE | INTRAVENOUS | Status: AC
  Filled 2024-02-24: qty 10

## 2024-02-24 NOTE — Progress Notes (Signed)
 eLink Physician-Brief Progress Note Patient Name: [name] DOB: 11-13-1955 MRN: [id number]   Date of Service  02/24/2024  HPI/Events of Note  Patient passed her swallowing eval, asking to advance diet  Glucose 86  eICU Interventions  Diet ordered Aspiration precaution Discussed with BSRN     Intervention Category Intermediate Interventions: Other:  [name] 02/24/2024, 8:35 PM

## 2024-02-24 NOTE — Consult Note (Signed)
 [facility] Consultation Note  Referring Provider: Pulmonary critical care medicine Primary Care Physician:  Arloa Elsie SAUNDERS, MD Primary Gastroenterologist:  None  Reason for Consultation:  elevated liver enzymes  HPI: [name] is a 68 y.o. female presenting acute respiratory failure and epistaxis.  We are asked to see for elevated LFTs.  Patient is intubated and no history obtainable.   Past Medical History:  Diagnosis Date   DVT (deep venous thrombosis) (HCC)    H1N1 influenza    Hypertension    Morbid obesity (HCC)    Seasonal depression (HCC)     Past Surgical History:  Procedure Laterality Date   CYST EXCISION  1980   Spinal cyst   KNEE ARTHROSCOPY     bilateral   TONSILLECTOMY  1980    Prior to Admission medications   Medication Sig Start Date End Date Taking? Authorizing Provider  amoxicillin -clavulanate (AUGMENTIN ) 875-125 MG tablet Take 1 tablet by mouth every 12 (twelve) hours for 5 days. 02/22/24 02/27/24  Freddi Hamilton, MD  carvedilol  (COREG ) 12.5 MG tablet Take 1 tablet (12.5 mg total) by mouth 2 (two) times daily with a meal. 09/24/22 12/23/22  [place], Camellia PARAS, DO  losartan  (COZAAR ) 25 MG tablet Take 1 tablet (25 mg total) by mouth daily. 09/24/22 12/23/22  [place], Camellia PARAS, DO  rosuvastatin  (CRESTOR ) 20 MG tablet Take 1 tablet (20 mg total) by mouth daily. 09/24/22 12/23/22  [place], Camellia PARAS, DO  sertraline  (ZOLOFT ) 100 MG tablet Take 100 mg by mouth daily. 08/03/22   [provider]    Current Facility-Administered Medications  Medication Dose Route Frequency Provider Last Rate Last Admin   albuterol  (PROVENTIL ) (2.5 MG/3ML) 0.083% nebulizer solution 2.5 mg  2.5 mg Nebulization Q2H PRN Doutova, Anastassia, MD       Ampicillin -Sulbactam (UNASYN ) 3 g in sodium chloride  0.9 % 100 mL IVPB  3 g Intravenous Q8H Pham, Minh Q, RPH-CPP   Stopped at 02/24/24 0854   Chlorhexidine  Gluconate Cloth 2 % PADS 6 each  6 each Topical Q0600 Maree Harder, MD   6  each at 02/24/24 0350   docusate sodium  (COLACE) capsule 100 mg  100 mg Oral BID Doutova, Anastassia, MD       docusate sodium  (COLACE) capsule 100 mg  100 mg Oral BID PRN Autry, Lauren E, PA-C       [START ON 02/25/2024] famotidine  (PEPCID ) tablet 20 mg  20 mg Per Tube Daily Pham, Minh Q, RPH-CPP       fentaNYL  (SUBLIMAZE ) injection 12.5-50 mcg  12.5-50 mcg Intravenous Q2H PRN Doutova, Anastassia, MD       folic acid  (FOLVITE ) tablet 1 mg  1 mg Per Tube Daily Dewald, Jonathan B, MD       heparin  injection 5,000 Units  5,000 Units Subcutaneous Q8H Autry, Lauren E, PA-C   5,000 Units at 02/24/24 0527   multivitamin with minerals tablet 1 tablet  1 tablet Per Tube Daily Dewald, Jonathan B, MD       ondansetron  (ZOFRAN ) tablet 4 mg  4 mg Oral Q6H PRN Doutova, Anastassia, MD       Or   ondansetron  (ZOFRAN ) injection 4 mg  4 mg Intravenous Q6H PRN Doutova, Anastassia, MD       Oral care mouth rinse  15 mL Mouth Rinse Q2H Autry, Lauren E, PA-C   15 mL at 02/24/24 1228   Oral care mouth rinse  15 mL Mouth Rinse PRN Autry, Lauren E, PA-C  polyethylene glycol (MIRALAX  / GLYCOLAX ) packet 17 g  17 g Oral Daily PRN Doutova, Anastassia, MD       propofol  (DIPRIVAN ) 1000 MG/100ML infusion  0-80 mcg/kg/min Intravenous Titrated Joelyn Satterfield, MD 7.41 mL/hr at 02/24/24 1000 10 mcg/kg/min at 02/24/24 1000   senna (SENOKOT) tablet 8.6 mg  1 tablet Per Tube BID Kara Dorn NOVAK, MD       thiamine  (VITAMIN B1) injection 100 mg  100 mg Intravenous Daily Doutova, Anastassia, MD   100 mg at 02/24/24 1022    Allergies as of 02/23/2024 - Review Complete 02/23/2024  Allergen Reaction Noted   Aspirin Nausea And Vomiting 08/05/2012   Hydrocodone-acetaminophen  Other (See Comments) 11/15/2022   Sulfa antibiotics Nausea Only 05/05/2012    Family History  Problem Relation Age of Onset   Heart disease Father    Diabetes Father    Hypertension Mother    Arthritis Mother    Hypertension Brother    Arthritis  Maternal Grandmother     Social History   Socioeconomic History   Marital status: Married    Spouse name: Not on file   Number of children: Not on file   Years of education: Not on file   Highest education level: Not on file  Occupational History   Not on file  Tobacco Use   Smoking status: Never   Smokeless tobacco: Never   Tobacco comments:    never used product  Substance and Sexual Activity   Alcohol use: No   Drug use: No   Sexual activity: Yes    Birth control/protection: None  Other Topics Concern   Not on file  Social History Narrative   Married   3 children   4 yo grandson who lives with them most of the time   Social Drivers of Corporate investment banker Strain: Not on file  Food Insecurity: Not on file  Transportation Needs: Not on file  Physical Activity: Not on file  Stress: Not on file  Social Connections: Not on file  Intimate Partner Violence: Not At Risk (02/18/2024)   Received from [facility]   HITS    Over the last 12 months how often did your partner physically hurt you?: Never    Over the last 12 months how often did your partner insult you or talk down to you?: Never    Over the last 12 months how often did your partner threaten you with physical harm?: Never    Over the last 12 months how often did your partner scream or curse at you?: Never    Review of Systems: Unable to obtain due to intubated state  Physical Exam: Vital signs in last 24 hours: Temp:  [97.7 F (36.5 C)-99.1 F (37.3 C)] 99.1 F (37.3 C) (07/21 1137) Pulse Rate:  [78-104] 82 (07/21 1000) Resp:  [12-24] 20 (07/21 1000) BP: (107-143)/(61-101) 121/70 (07/21 1000) SpO2:  [94 %-100 %] 95 % (07/21 1000) FiO2 (%):  [40 %-100 %] 40 % (07/21 1253) Weight:  [123.5 kg-125 kg] 123.5 kg (07/21 0318) Last BM Date :  (PTA) General:   Overweight, sedated, intubated Head:  Normocephalic and atraumatic. Eyes:  Sclera clear, no icterus.   Conjunctiva pink. Ears:  Normal  auditory acuity. Nose:  No deformity, discharge,  or lesions. Right rhino rocket in place Mouth:  No deformity or lesions.  Oropharynx pink & moist. Neck:  Thick, Supple; no masses or thyromegaly. Lungs:  No visible respiratory distress, on ventilator Abdomen:  Soft, protuberant, nontender and nondistended. No masses, hepatosplenomegaly or hernias noted. No guarding, and without rebound.     Msk:  Symmetrical without gross deformities. Normal posture. Pulses:  Normal pulses noted. Extremities:  Without clubbing or edema. Neurologic:  Intubated, sedated, non-responsive Skin:  Extensive facial bruising Intact without significant lesions or rashes. Cervical Nodes:  No significant cervical adenopathy. Psych: Intubated, sedated, non-responsive   Lab Results: Recent Labs    02/23/24 2028 02/23/24 2049 02/24/24 0243 02/24/24 0347 02/24/24 0449  WBC 10.4  --  9.5 9.1  --   HGB 11.2*   < > 11.0* 10.9* 10.9*  HCT 35.9*   < > 34.7* 34.3* 32.0*  PLT 183  --  184 187  --    < > = values in this interval not displayed.   BMET Recent Labs    02/22/24 1947 02/23/24 2028 02/23/24 2049 02/24/24 0346 02/24/24 0347 02/24/24 0449  NA 142 141 140  --  141 138  K 4.5 4.9 4.7  --  5.1 4.6  CL 102 98  --   --  99  --   CO2 30 31  --   --  30  --   GLUCOSE 130* 136*  --   --  128*  --   BUN 15 38*  --   --  42*  --   CREATININE 0.60 2.34*  --  2.42* 2.34*  --   CALCIUM  8.8* 8.9  --   --  8.8*  --    LFT Recent Labs    02/24/24 0347  PROT 5.9*  ALBUMIN 3.3*  AST 2,178*  ALT 1,915*  ALKPHOS 63  BILITOT 1.0   PT/INR Recent Labs    02/22/24 1947 02/23/24 2352  LABPROT 13.3 15.8*  INR 1.0 1.2    Studies/Results: DG Abd Portable 1V Result Date: 02/24/2024 EXAM: 1 VIEW XRAY OF THE ABDOMEN 02/24/2024 03:49:00 AM COMPARISON: CT abdomen / pelvis earlier today. CLINICAL HISTORY: [id number] Encounter for orogastric (OG) tube placement [id number]. Og tube FINDINGS: BOWEL: Nonobstructive bowel  gas pattern. SOFT TISSUES: Enteric tube in the distal gastric antrum. BONES: No acute osseous abnormality. IMPRESSION: 1. Enteric tube in the distal gastric antrum. 2. Nonobstructive bowel gas pattern. Electronically signed by: Pinkie Pebbles MD 02/24/2024 03:58 AM EDT RP Workstation: [id number]   Portable Chest x-ray Result Date: 02/24/2024 EXAM: 1 VIEW XRAY OF THE CHEST 02/24/2024 03:50:00 AM COMPARISON: 02/23/2024 CLINICAL HISTORY: Endotracheal tube present. FINDINGS: LUNGS AND PLEURA: Mild pulmonary edema, similar. No definite pleural effusions, noting exclusion of the left costophrenic angle. No pneumothorax. HEART AND MEDIASTINUM: Cardiomegaly. BONES AND SOFT TISSUES: No acute osseous abnormality. LINES AND TUBES: Endotracheal tube in place with tip 1.5 cm above carina. Enteric tube in place coursing below diaphragm with tip collimated off view. IMPRESSION: 1. Cardiomegaly with mild pulmonary edema, similar to prior study. 2. Support apparatus as above. Electronically signed by: Pinkie Pebbles MD 02/24/2024 03:58 AM EDT RP Workstation: [id number]   CT ABDOMEN PELVIS WO CONTRAST Result Date: 02/24/2024 EXAM: CT ABDOMEN AND PELVIS WITHOUT CONTRAST 02/24/2024 01:06:17 AM TECHNIQUE: CT of the abdomen and pelvis was performed without the administration of intravenous contrast. Multiplanar reformatted images are provided for review. Automated exposure control, iterative reconstruction, and/or weight based adjustment of the mA/kV was utilized to reduce the radiation dose to as low as reasonably achievable. COMPARISON: None available. CLINICAL HISTORY: Kidney failure, acute. FINDINGS: LOWER CHEST: Mild bibasilar atelectasis. LIVER: The liver is unremarkable. GALLBLADDER AND BILE DUCTS: Gallbladder is  unremarkable. No biliary ductal dilatation. SPLEEN: No acute abnormality. PANCREAS: No acute abnormality. ADRENAL GLANDS: No acute abnormality. KIDNEYS, URETERS AND BLADDER: No stones in the kidneys or ureters.  No hydronephrosis. No perinephric or periureteral stranding. Urinary bladder is unremarkable. GI AND BOWEL: Normal appendix (image 55). Left colonic diverticulosis, without evidence of diverticulitis. Stomach demonstrates no acute abnormality. There is no bowel obstruction. No bowel wall thickening. PERITONEUM AND RETROPERITONEUM: No ascites. No free air. VASCULATURE: Aorta is normal in caliber. LYMPH NODES: No lymphadenopathy. REPRODUCTIVE ORGANS: Mildly heterogeneous uterus, suggesting uterine fibroids. BONES AND SOFT TISSUES: Mild degenerative changes of the visualized thoracolumbar spine. No acute osseous abnormality. No focal soft tissue abnormality. IMPRESSION: 1. No acute findings in the abdomen or pelvis. Electronically signed by: Pinkie Pebbles MD 02/24/2024 01:19 AM EDT RP Workstation: [id number]   US  Abdomen Limited RUQ (LIVER/GB) Result Date: 02/24/2024 CLINICAL DATA:  Abdominal pain. EXAM: ULTRASOUND ABDOMEN LIMITED RIGHT UPPER QUADRANT COMPARISON:  None Available. FINDINGS: Gallbladder: No gallstones are visualized. The gallbladder wall measures 3.1 mm in thickness. No sonographic Murphy sign noted by sonographer. Common bile duct: Diameter: 3.4 mm Liver: The liver is limited in visualization. No focal lesion identified. Diffusely increased echogenicity of the liver parenchyma is noted. Portal vein is patent on color Doppler imaging with normal direction of blood flow towards the liver. Other: The study is technically limited secondary to the patient's body habitus and limited patient positioning. IMPRESSION: 1. Limited study, as described above. 2. Hepatic steatosis. Electronically Signed   By: Suzen Dials M.D.   On: 02/24/2024 00:59   CT HEAD WO CONTRAST Result Date: 02/23/2024 CLINICAL DATA:  Head trauma, minor (Age >= 65y) hx recent fall, diagnosed with nasal fracture at another facility, now with decreased mental status EXAM: CT HEAD WITHOUT CONTRAST TECHNIQUE: Contiguous axial  images were obtained from the base of the skull through the vertex without intravenous contrast. RADIATION DOSE REDUCTION: This exam was performed according to the departmental dose-optimization program which includes automated exposure control, adjustment of the mA and/or kV according to patient size and/or use of iterative reconstruction technique. COMPARISON:  None Available. FINDINGS: Brain: No evidence of large-territorial acute infarction. No parenchymal hemorrhage. No mass lesion. No extra-axial collection. No mass effect or midline shift. No hydrocephalus. Basilar cisterns are patent. Vascular: No hyperdense vessel. Skull: No acute fracture or focal lesion. Partially visualized acute bilateral paranasal bone fractures. Sinuses/Orbits: Bilateral maxillary and right sphenoid sinus mucosal thickening. Right mastoid air cell partial effusion and fluid within the right middle ear. Debris within the right nasal cavity. Paranasal sinuses and left mastoid air cells are clear. The orbits are unremarkable. Other: None. IMPRESSION: 1. No acute intracranial abnormality. 2. Partially visualized acute bilateral paranasal bone fractures. Consider CT max face for further evaluation. 3. Right mastoid air cell partial effusion and fluid within the right middle ear. Electronically Signed   By: Morgane  Naveau M.D.   On: 02/23/2024 22:21   DG Chest 1 View Result Date: 02/23/2024 CLINICAL DATA:  ALTERED MENTAL STATUS Reason for exam: Lethargic, Disoriented, SHOB EXAM: CHEST  1 VIEW COMPARISON:  Chest x-ray 09/20/2022 FINDINGS: Limited evaluation due to overlapping osseous structures and overlying soft tissues. The heart and mediastinal contours are unchanged. Low lung volumes. No focal consolidation. Mild pulmonary edema. Likely bilateral pleural effusion. No pneumothorax. No acute osseous abnormality. IMPRESSION: 1. Low lung volumes and cardiomegaly. Limited evaluation due to overlapping osseous structures and overlying soft  tissues. Mild pulmonary edema with likely bilateral pleural effusions. 2.  Recommend repeat chest x-ray with PA and lateral views for further evaluation. Electronically Signed   By: Morgane  Naveau M.D.   On: 02/23/2024 21:44    Impression:   Elevated LFTs.  Rhabdomyolysis and shock liver leading considerations.  No biliary obstruction on imaging.  Acute hepatitis panel negative. Acute hypercapnic respiratory failure in background of COPD, intubated. Epistaxis, nasal tamponade/packing in place.  Plan:   Check INR. Suspect liver enzymes improve over time with hydration and restoration of euvolemia and supportive care. No plans for further work-up or testing at the present time. [facility] will follow along at a distance.   LOS: 1 day   Finnley Lewis M  02/24/2024, 1:15 PM  Cell [phone] If no answer or after 5 PM call [phone]

## 2024-02-24 NOTE — H&P (Signed)
 NAMESundae Ballard, MRN:  [id number], DOB:  1955-08-18, LOS: 1 ADMISSION DATE:  02/23/2024, CONSULTATION DATE:  02/24/2024 REFERRING MD:  Blease Quiver, MD, CHIEF COMPLAINT:  AMS  History of Present Illness:  67 y/o female who has a PMH COPD on 4 liters home O2 who has had intubation and tracheostomy secondary to flu in the past who presented x 1 week ago after a fall and she fell on her face.  Then yesterday she came in with significant nose bleed and sent home with a rhinojet.  She is on Plavix  since a previous admission where her troponins were elevated but she did not have a cath or stents but was kept on the Plavix  and never stopped it.  Her haring also gets worse when her CO2 go up.  Then next day she was lethargic and family noticed she was altered.  EMS noted her to be disoriented with time and situation.  She was placed on BiPAP.  Pertinent  Medical History  HTN, COPD on 4 liters home O2, DVT, H1N1 Flu resulting in Intubation and Tracheostomy Significant Hospital Events: Including procedures, antibiotic start and stop dates in addition to other pertinent events   7/21: Admit to ICU  Interim History / Subjective:  N/a  Objective    Blood pressure 133/77, pulse 79, temperature 97.7 F (36.5 C), temperature source Oral, resp. rate 20, height 5' 4 (1.626 m), weight 125 kg, SpO2 100%.    Vent Mode: PCV;BIPAP FiO2 (%):  [40 %] 40 % Set Rate:  [18 bmp] 18 bmp PEEP:  [8 cmH20] 8 cmH20 Pressure Support:  [8 cmH20] 8 cmH20  No intake or output data in the 24 hours ending 02/24/24 0151 Filed Weights   02/23/24 2017  Weight: 125 kg    Examination: General: Arousable but remains lethargic HENT: PERRLA, no icterus EOMI, rhino jet in right nares Lungs: CTA diminished bs b/l Cardiovascular: Reg s1s2 no murmurs or rubs or gallops Abdomen: soft obese nt nd bs pos no guarding Extremities: no cyanosis, clubbing and mild edema Neuro: Lethargic but arousable and oriented x 2-person and  place   Resolved problem list   Assessment and Plan  AMS Likely from the increased CO2, although mentation did improve on the BiPAP Acute on chronic hypercapnic respiratory failure Continue with BiPAP and may require intubation given worsening CO2 COPD Does not seem to be in exacerbation Nebs and O2 to continue HTN Currently patient's BP is normotensive and will continue to monitor CHF by history Grade I diastolic dysfunction Diuresis as needed AKI on chronic CKD Continue to monitor Transaminitis Hepatic steatosis per U/S and negative CT abdomen Work up has been ordered No gallstones on U/S Total Bilirubin normal Epistaxis S/p rhinojet in place H/o DVT and PE No anticoagulation right now given the significant nose bleed Chronic anemia Stable and will continue to monitor Morbid obesity class III Life style changes to advised  Best Practice (right click and Reselect all SmartList Selections daily)   Diet/type: NPO DVT prophylaxis prophylactic heparin   Pressure ulcer(s): N/A GI prophylaxis: N/A Lines: N/A Foley:  N/A Code Status:  full code   Labs   CBC: Recent Labs  Lab 02/22/24 1947 02/23/24 2028 02/23/24 2049  WBC 7.8 10.4  --   NEUTROABS 6.3 9.1*  --   HGB 12.1 11.2* 11.2*  HCT 38.9 35.9* 33.0*  MCV 91.3 92.1  --   PLT 237 183  --     Basic Metabolic Panel: Recent Labs  Lab 02/22/24 1947 02/23/24 2028 02/23/24 2049  NA 142 141 140  K 4.5 4.9 4.7  CL 102 98  --   CO2 30 31  --   GLUCOSE 130* 136*  --   BUN 15 38*  --   CREATININE 0.60 2.34*  --   CALCIUM  8.8* 8.9  --    GFR: Estimated Creatinine Clearance: 30.9 mL/min (A) (by C-G formula based on SCr of 2.34 mg/dL (H)). Recent Labs  Lab 02/22/24 1947 02/23/24 2028 02/24/24 0021  WBC 7.8 10.4  --   LATICACIDVEN  --   --  1.3    Liver Function Tests: Recent Labs  Lab 02/22/24 1947 02/23/24 2028  AST 18 1,714*  ALT 12 1,607*  ALKPHOS 65 63  BILITOT 1.2 0.8  PROT 6.3* 6.1*   ALBUMIN 3.3* 3.2*   No results for input(s): LIPASE, AMYLASE in the last 168 hours. Recent Labs  Lab 02/23/24 2352  AMMONIA 43*    ABG    Component Value Date/Time   PHART 7.296 (L) 09/21/2022 1141   PCO2ART 63.3 (H) 09/21/2022 1141   PO2ART 113 (H) 09/21/2022 1141   HCO3 36.9 (H) 02/23/2024 2340   TCO2 35 (H) 02/23/2024 2049   ACIDBASEDEF 5.0 (H) 09/20/2022 2201   O2SAT 28.4 02/23/2024 2340     Coagulation Profile: Recent Labs  Lab 02/22/24 1947 02/23/24 2352  INR 1.0 1.2    Cardiac Enzymes: No results for input(s): CKTOTAL, CKMB, CKMBINDEX, TROPONINI in the last 168 hours.  HbA1C: No results found for: HGBA1C  CBG: Recent Labs  Lab 02/23/24 2021  GLUCAP 111*    Review of Systems:   Lethargic unable to obtain  Past Medical History:  She,  has a past medical history of DVT (deep venous thrombosis) (HCC), H1N1 influenza, Hypertension, Morbid obesity (HCC), and Seasonal depression (HCC).   Surgical History:   Past Surgical History:  Procedure Laterality Date   CYST EXCISION  1980   Spinal cyst   KNEE ARTHROSCOPY     bilateral   TONSILLECTOMY  1980     Social History:   reports that she has never smoked. She has never used smokeless tobacco. She reports that she does not drink alcohol and does not use drugs.   Family History:  Her family history includes Arthritis in her maternal grandmother and mother; Diabetes in her father; Heart disease in her father; Hypertension in her brother and mother.   Allergies Allergies  Allergen Reactions   Aspirin Nausea And Vomiting   Hydrocodone-Acetaminophen  Other (See Comments)   Sulfa Antibiotics Nausea Only     Home Medications  Prior to Admission medications   Medication Sig Start Date End Date Taking? Authorizing Provider  amoxicillin -clavulanate (AUGMENTIN ) 875-125 MG tablet Take 1 tablet by mouth every 12 (twelve) hours for 5 days. 02/22/24 02/27/24  Freddi Hamilton, MD  carvedilol   (COREG ) 12.5 MG tablet Take 1 tablet (12.5 mg total) by mouth 2 (two) times daily with a meal. 09/24/22 12/23/22  [place], Camellia PARAS, DO  losartan  (COZAAR ) 25 MG tablet Take 1 tablet (25 mg total) by mouth daily. 09/24/22 12/23/22  [place], Camellia PARAS, DO  rosuvastatin  (CRESTOR ) 20 MG tablet Take 1 tablet (20 mg total) by mouth daily. 09/24/22 12/23/22  [place], Camellia PARAS, DO  sertraline  (ZOLOFT ) 100 MG tablet Take 100 mg by mouth daily. 08/03/22   [provider]     Critical care time: 67   The patient is critically ill with multiple organ system failure  and requires high complexity decision making for assessment and support, frequent evaluation and titration of therapies, advanced monitoring, review of radiographic studies and interpretation of complex data.   Critical Care Time devoted to patient care services, exclusive of separately billable procedures, described in this note is 35 minutes.   Orlin Fairly, MD [facility] See Amion for pager  If no response to pager , please call [phone] until 7pm After 7:00 pm call Elink  [phone] 02/24/2024, 1:51 AM

## 2024-02-24 NOTE — Assessment & Plan Note (Signed)
 Per family she was told she has she does not have sleep apnea

## 2024-02-24 NOTE — Assessment & Plan Note (Signed)
 Will need follow-up as an outpatient with ENT Change Augmentin  to Unasyn  while difficult to take p.o.

## 2024-02-24 NOTE — Assessment & Plan Note (Addendum)
 Check CK Hepatitis serologies CT abdomen pending  Nonobstructive pattern Sent message to [facility] given severity Check acetaminophen  level INR and ammonia level Hold statin Patient has no history of alcohol abuse Could be in the setting of severe dehydration as patient have had very little p.o. intake for past few days

## 2024-02-24 NOTE — H&P (Incomplete)
 [name] FMW:[id number] DOB: 22-Oct-1955 DOA: 02/23/2024    PCP: Arloa Elsie SAUNDERS, MD   Outpatient Specialists:     Pulmonary    Dr. Neda Jennet LABOR, MD    Patient arrived to ER on 02/23/24 at 2002 Referred by Attending Randol Simmonds, MD   Patient coming from:    home Lives  With family    Chief Complaint:   Chief Complaint  Patient presents with  . Altered Mental Status    HPI: [name] is a 68 y.o. 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 flu requiring previous intubation with tracheostomy   Presented with   increased somnolence Pt had a fall 1 wk ago  Has been more confused  At baseline on 4L FIO2 Had uncontrolled nasal bleed and now has rhino rocket Unable to get her needed O2 Has been hard to wake up today  On EMS arrival pt is disoriented to to time and situation     In the past was admitted to ICU for hypercarbic respiratory failure Decreased PO intake over the past few days Only ha a a few sips  After a recent fall she ended up with a nasal fracture will need to follow up with ENT  She is on Plavix  since her troponin were elevated during last admit  She has not had a cardiac cath but she stayed on Plavix  Her hearing has also been affected in hte past when her CO2 was up she lost hearing  When this happened again family suspected that she was hypercarbic   Denies significant ETOH intake   Does not smoke     Regarding pertinent Chronic problems:    Hyperlipidemia -  on statins crestor  Lipid Panel     Component Value Date/Time   CHOL 187 04/05/2020 0644   TRIG 128 04/05/2020 0644   HDL 30 (L) 04/05/2020 0644   CHOLHDL 6.2 04/05/2020 0644   VLDL 26 04/05/2020 0644   LDLCALC 131 (H) 04/05/2020 0644     HTN on carvedilol  12.5 mg p.o. twice daily, losartan  25 mg p.o.    chronic CHF diastolic - last echo  Recent Results (from the past 56199 hours)  ECHOCARDIOGRAM COMPLETE   Collection Time: 09/21/22   1:38 PM  Result Value   Weight 4,345.71   BP 131/98   Single Plane A2C EF 51.5   Single Plane A4C EF 57.5   Calc EF 57.6   S' Lateral 3.00   Area-P 1/2 2.76   Est EF 60 - 65%   Narrative      ECHOCARDIOGRAM REPORT       1. Left ventricular ejection fraction, by estimation, is 60 to 65%. The left ventricle has normal function. The left ventricle has no regional wall motion abnormalities. There is moderate left ventricular hypertrophy. Left ventricular diastolic  parameters are consistent with Grade I diastolic dysfunction (impaired relaxation).  2. Right ventricular systolic function is normal. The right ventricular size is normal.  3. The mitral valve is normal in structure. No evidence of mitral valve regurgitation. No evidence of mitral stenosis. Severe mitral annular calcification.  4. The aortic valve is calcified. There is mild calcification of the aortic valve. There is mild thickening of the aortic valve. Aortic valve regurgitation is not visualized. Aortic valve sclerosis is present, with no evidence of aortic valve stenosis.  5. The inferior vena cava is dilated in size with <50% respiratory variability,  suggesting right atrial pressure of 15 mmHg.           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  AKI.  NO known prior history of liver problems per family.  No alcohol use [JK]    Clinical Course User Index [JK] Randol Simmonds, MD       Lab Orders         Comprehensive metabolic panel         CBC with Differential/Platelet         Urinalysis, Routine w reflex microscopic -Urine, Clean Catch         Ethanol         Rapid urine drug screen (hospital performed)         Ammonia         CK         Protime-INR         Acetaminophen  level         Vitamin B12         Folate         Iron and TIBC         Ferritin         Reticulocytes         Lactic acid, plasma         Procalcitonin         Blood gas, venous         Magnesium         Phosphorus         Creatinine, urine, random         Osmolality, urine         Osmolality         Sodium, urine, random         Prealbumin         Hepatitis panel, acute         TSH         Urinalysis, Complete w Microscopic -Urine, Clean Catch         I-Stat venous blood gas, ED         CBG monitoring, ED      CT HEAD  1. No acute intracranial abnormality. 2. Partially visualized acute bilateral paranasal bone fractures. Consider CT max face for further evaluation. 3. Right mastoid air cell partial effusion and fluid within the right middle ear.    CXR -  Low lung volumes and cardiomegaly. Limited evaluation due to overlapping osseous structures and overlying soft tissues. Mild pulmonary edema with likely bilateral pleural effusions. 2. Recommend repeat chest x-ray with PA and lateral views for further evaluation.  CTabd/pelvis -  orderd   Following Medications were ordered in ER: Medications - No data to display  ____     ED Triage Vitals  Encounter Vitals Group     BP 02/23/24 2014 133/77     Girls Systolic BP Percentile --      Girls Diastolic BP Percentile --      Boys Systolic BP Percentile --      Boys Diastolic BP Percentile --      Pulse Rate 02/23/24 2014 79     Resp 02/23/24 2014 20     Temp  02/23/24 2014 97.7 F (36.5 C)     Temp Source 02/23/24 2014 Oral     SpO2 02/23/24 2014 100 %     Weight 02/23/24 2017 275 lb 9.2 oz (125 kg)     Height 02/23/24 2017 5' 4 (1.626 m)     Head Circumference --      Peak Flow --      Pain Score --      Pain Loc --      Pain Education --      Exclude from Growth Chart --   UFJK(75)@     _________________________________________ Significant initial  Findings: Abnormal Labs Reviewed  COMPREHENSIVE METABOLIC PANEL WITH GFR - Abnormal; Notable for the following components:      Result Value   Glucose, Bld 136 (*)    BUN 38 (*)    Creatinine, Ser 2.34 (*)    Total Protein 6.1 (*)    Albumin 3.2 (*)    AST 1,714 (*)    ALT 1,607 (*)    GFR, Estimated 22 (*)    All other components within normal limits  CBC WITH DIFFERENTIAL/PLATELET - Abnormal; Notable for the following components:   Hemoglobin 11.2 (*)    HCT 35.9 (*)    Neutro Abs 9.1 (*)    Lymphs Abs 0.6 (*)    All other components within normal limits  I-STAT VENOUS BLOOD GAS, ED - Abnormal; Notable for the following components:   pH, Ven 7.239 (*)    pCO2, Ven 75.8 (*)    pO2, Ven 31 (*)    Bicarbonate 32.4 (*)    TCO2 35 (*)    Acid-Base Excess 3.0 (*)    HCT 33.0 (*)    Hemoglobin 11.2 (*)    All other components within normal limits  CBG MONITORING, ED - Abnormal; Notable for the following components:   Glucose-Capillary 111 (*)    All other components within normal limits     ECG: Ordered Personally reviewed and interpreted by me showing: HR : 79 Rhythm:Sinus rhythm Supraventricular bigeminy , new since last tracing Repol abnrm suggests ischemia, lateral leads QTC 428    The recent clinical data is shown below. Vitals:   02/23/24 2014 02/23/24 2017 02/23/24 2134  BP: 133/77    Pulse: 79    Resp: 20    Temp: 97.7 F (36.5 C)    TempSrc: Oral    SpO2: 100%  100%  Weight:  125 kg   Height:  5' 4 (1.626 m)       WBC     Component Value Date/Time    WBC 10.4 02/23/2024 2028   LYMPHSABS 0.6 (L) 02/23/2024 2028   LYMPHSABS 1.6 09/11/2013 1145   MONOABS 0.6 02/23/2024 2028   EOSABS 0.0 02/23/2024 2028   EOSABS 0.2 09/11/2013 1145   BASOSABS 0.0 02/23/2024 2028   BASOSABS 0.0 09/11/2013 1145  Procalcitonin   Ordered      UA   ordered     ________________________________________________________________   Venous  Blood Gas result:  pH  7.239 Low  Sodium 140 mmol/L   pCO2, Ven 75.8 High Panic  mmHg Potassium 4.7 mmol/L  pO2, Ven 31 Low Panic  mmHg       __________________________________________________________ Recent Labs  Lab 02/22/24 1947 02/23/24 2028 02/23/24 2049  NA 142 141 140  K 4.5 4.9 4.7  CO2 30 31  --   GLUCOSE 130* 136*  --   BUN 15 38*  --   CREATININE 0.60 2.34*  --   CALCIUM  8.8* 8.9  --     Cr    Up from baseline see below Lab Results  Component Value Date   CREATININE 2.34 (H) 02/23/2024   CREATININE 0.60 02/22/2024   CREATININE 1.04 (H) 09/24/2022    Recent Labs  Lab 02/22/24 1947 02/23/24 2028  AST 18 1,714*  ALT 12 1,607*  ALKPHOS 65 63  BILITOT 1.2 0.8  PROT 6.3* 6.1*  ALBUMIN 3.3* 3.2*   Lab Results  Component Value Date   CALCIUM  8.9 02/23/2024   PHOS 1.8 (L) 09/22/2022    Plt: Lab Results  Component Value Date   PLT 183 02/23/2024       Recent Labs  Lab 02/22/24 1947 02/23/24 2028 02/23/24 2049  WBC 7.8 10.4  --   NEUTROABS 6.3 9.1*  --   HGB 12.1 11.2* 11.2*  HCT 38.9 35.9* 33.0*  MCV 91.3 92.1  --   PLT 237 183  --     HG/HCT  stable,       Component Value Date/Time   HGB 11.2 (L) 02/23/2024 2049   HGB 13.9 09/11/2013 1145   HCT 33.0 (L) 02/23/2024 2049   HCT 41.9 09/11/2013 1145   MCV 92.1 02/23/2024 2028   MCV 83 09/11/2013 1145    _______________________________________________ Hospitalist was called for admission for   Respiratory acidosis  Elevated liver enzymes  AKI (acute kidney injury) (HCC)    The following Work up has been  ordered so far:  Orders Placed This Encounter  Procedures  . DG Chest 1 View  . CT HEAD WO CONTRAST  . US  Abdomen Limited RUQ (LIVER/GB)  . Comprehensive metabolic panel  . CBC with Differential/Platelet  . Urinalysis, Routine w reflex microscopic -Urine, Clean Catch  . Ethanol  . Rapid urine drug screen (hospital performed)  . Ammonia  . CK  . Protime-INR  . Acetaminophen  level  . Vitamin B12  . Folate  . Iron and TIBC  . Ferritin  . Reticulocytes  . Lactic acid, plasma  . Procalcitonin  . Blood gas, venous  . Magnesium  . Phosphorus  . Creatinine, urine, random  . Osmolality, urine  . Osmolality  . Sodium, urine, random  . Prealbumin  . Hepatitis panel, acute  . TSH  . Urinalysis, Complete w Microscopic -Urine, Clean Catch  . ED Cardiac monitoring  . Initiate Carrier Fluid Protocol  . Strict intake and output  . If Cath X 3 Or Residual >300 Place Foley  . Bladder scan  . Consult to hospitalist  . ED Pulse oximetry, continuous  . Bipap  . I-Stat venous blood gas, ED  . CBG monitoring, ED  . EKG 12-Lead  . ED EKG  . Insert peripheral IV  . Insert peripheral IV     OTHER Significant initial  Findings:  labs showing:  DM  labs:  HbA1C: No results for input(s): HGBA1C in the last 8760 hours.     CBG (last 3)  Recent Labs    02/23/24 2021  GLUCAP 111*          Cultures:    Component Value Date/Time   SDES ABSCESS RIGHT SCAPULA 10/13/2012 1640   SPECREQUEST NONE 10/13/2012 1640   CULT NO GROWTH 3 DAYS 10/13/2012 1640   REPTSTATUS 10/16/2012 FINAL 10/13/2012 1640     Radiological Exams on Admission: CT HEAD WO CONTRAST Result Date: 02/23/2024 CLINICAL DATA:  Head trauma, minor (Age >= 65y) hx recent fall, diagnosed with nasal fracture at another facility, now with decreased mental status EXAM: CT HEAD WITHOUT CONTRAST TECHNIQUE: Contiguous axial images were obtained from the base of the skull through the vertex without intravenous  contrast. RADIATION DOSE REDUCTION: This exam was performed according to the departmental dose-optimization program which includes automated exposure control, adjustment of the mA and/or kV according to patient size and/or use of iterative reconstruction technique. COMPARISON:  None Available. FINDINGS: Brain: No evidence of large-territorial acute infarction. No parenchymal hemorrhage. No mass lesion. No extra-axial collection. No mass effect or midline shift. No hydrocephalus. Basilar cisterns are patent. Vascular: No hyperdense vessel. Skull: No acute fracture or focal lesion. Partially visualized acute bilateral paranasal bone fractures. Sinuses/Orbits: Bilateral maxillary and right sphenoid sinus mucosal thickening. Right mastoid air cell partial effusion and fluid within the right middle ear. Debris within the right nasal cavity. Paranasal sinuses and left mastoid air cells are clear. The orbits are unremarkable. Other: None. IMPRESSION: 1. No acute intracranial abnormality. 2. Partially visualized acute bilateral paranasal bone fractures. Consider CT max face for further evaluation. 3. Right mastoid air cell partial effusion and fluid within the right middle ear. Electronically Signed   By: Morgane  Naveau M.D.   On: 02/23/2024 22:21   DG Chest 1 View Result Date: 02/23/2024 CLINICAL DATA:  ALTERED MENTAL STATUS Reason for exam: Lethargic, Disoriented, SHOB EXAM: CHEST  1 VIEW COMPARISON:  Chest x-ray 09/20/2022 FINDINGS: Limited evaluation due to overlapping osseous structures and overlying soft tissues. The heart and mediastinal contours are unchanged. Low lung volumes. No focal consolidation. Mild pulmonary edema. Likely bilateral pleural effusion. No pneumothorax. No acute osseous abnormality. IMPRESSION: 1. Low lung volumes and cardiomegaly. Limited evaluation due to overlapping osseous structures and overlying soft tissues. Mild pulmonary edema with likely bilateral pleural effusions. 2. Recommend  repeat chest x-ray with PA and lateral views for further evaluation. Electronically Signed   By: Morgane  Naveau M.D.   On: 02/23/2024 21:44   _______________________________________________________________________________________________________ Latest  Blood pressure 133/77, pulse 79, temperature 97.7 F (36.5 C), temperature source Oral, resp. rate 20, height 5' 4 (1.626 m), weight 125 kg, SpO2 100%.   Vitals  labs and radiology finding personally reviewed  Review of Systems:    Pertinent positives include:  somnolence  Constitutional:  No weight loss, night sweats, Fevers, chills, fatigue, weight loss  HEENT:  No headaches, Difficulty swallowing,Tooth/dental problems,Sore throat,  No sneezing, itching, ear ache, nasal congestion, post nasal drip,  Cardio-vascular:  No chest pain, Orthopnea, PND, anasarca, dizziness, palpitations.no Bilateral lower extremity swelling  GI:  No heartburn, indigestion, abdominal pain, nausea, vomiting, diarrhea, change in bowel habits, loss of appetite, melena, blood in stool, hematemesis Resp:  no shortness of breath at rest. No dyspnea on exertion, No excess mucus, no productive cough, No non-productive cough, No coughing up of blood.No change in color of mucus.No wheezing. Skin:  no rash or lesions. No jaundice GU:  no dysuria, change in color of urine, no urgency or frequency. No straining to urinate.  No flank pain.  Musculoskeletal:  No joint pain or no joint swelling. No decreased range of motion. No back pain.  Psych:  No change in mood or affect. No depression or anxiety. No memory loss.  Neuro: no localizing neurological complaints, no tingling, no weakness, no double vision, no gait abnormality, no slurred speech, no confusion  All systems reviewed and apart from HOPI all are negative _______________________________________________________________________________________________ Past Medical History:   Past Medical History:   Diagnosis Date  . DVT (deep venous thrombosis) (HCC)   . H1N1 influenza   . Hypertension   . Morbid obesity (HCC)   . Seasonal depression (HCC)     Past Surgical History:  Procedure Laterality Date  . CYST EXCISION  1980   Spinal cyst  . KNEE ARTHROSCOPY     bilateral  . TONSILLECTOMY  1980    Social History:  Ambulatory   independently      reports that she has never smoked. She has never used smokeless tobacco. She reports that she does not drink alcohol and does not use drugs.     Family History:   Family History  Problem Relation Age of Onset  . Heart disease Father   . Diabetes Father   . Hypertension Mother   . Arthritis Mother   . Hypertension Brother   . Arthritis Maternal Grandmother    ______________________________________________________________________________________________ Allergies: Allergies  Allergen Reactions  . Aspirin Nausea And Vomiting  . Hydrocodone-Acetaminophen  Other (See Comments)  . Sulfa Antibiotics Nausea Only     Prior to Admission medications   Medication Sig Start Date End Date Taking? Authorizing Provider  amoxicillin -clavulanate (AUGMENTIN ) 875-125 MG tablet Take 1 tablet by mouth every 12 (twelve) hours for 5 days. 02/22/24 02/27/24  Freddi Hamilton, MD  carvedilol  (COREG ) 12.5 MG tablet Take 1 tablet (12.5 mg total) by mouth 2 (two) times daily with a meal. 09/24/22 12/23/22  [place], Camellia PARAS, DO  losartan  (COZAAR ) 25 MG tablet Take 1 tablet (25 mg total) by mouth daily. 09/24/22 12/23/22  [place], Camellia PARAS, DO  rosuvastatin  (CRESTOR ) 20 MG tablet Take 1 tablet (20 mg total) by mouth daily. 09/24/22 12/23/22  [place], Camellia PARAS, DO  sertraline  (ZOLOFT ) 100 MG tablet Take 100 mg by mouth daily. 08/03/22   [provider]    ___________________________________________________________________________________________________ Physical Exam:    02/23/2024    8:17 PM 02/23/2024    8:14 PM 02/22/2024    9:30 PM  Vitals with BMI   Height 5' 4    Weight 275 lbs 9 oz    BMI 47.28    Systolic  133 154  Diastolic  77 86  Pulse  79 78     1. General:  in No  Acute distress    Chronically ill  -appearing 2. Psychological: Alert and   Oriented to self 3. Head/ENT:   Dry Mucous Membranes                          Head Non traumatic, neck supple                         Poor Dentition 4. SKIN:  decreased Skin turgor,  Skin clean Dry and intact no rash    5. Heart: Regular rate and rhythm no  Murmur, no  Rub or gallop 6. Lungs  no wheezes or crackles  on BIPAP 7. Abdomen: Soft,  non-tender, Non distended   obese  bowel sounds present 8. Lower extremities: no clubbing, cyanosis, no  edema 9. Neurologically Grossly intact, moving all 4 extremities equally   10. MSK: Normal range of motion    Chart has been reviewed  ______________________________________________________________________________________________  Assessment/Plan 68 y.o. 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 AKI (acute kidney injury) (HCC) . Acute respiratory failure with hypoxia and hypercapnia (HCC) . Benign essential HTN . Chronic respiratory failure (HCC) . Obesity, Class III, BMI 40-49.9 (morbid obesity) . OSA (obstructive sleep apnea) . Nasal fracture . Elevated LFTs     Acute respiratory failure with hypoxia and hypercapnia (HCC)  this patient has acute respiratory failure with Hypoxia and  Hypercarbia as documented by the presence of following:   pCO2 >50   Likely due to: Somnolence inability to use home oxygen Provide O2 therapy and titrate as needed  Continuous pulse ox   check Pulse ox with ambulation prior to discharge flutter valve ordered On BIPAP now mentation is improving  repeat VBG If pt respiratory status worsens will need PCCM consult   AKI (acute kidney injury) (HCC) Will rehydrate obtain electrolytes Imaging to make sure there is no evidence of obstructive process The family endorses the patient have had decreased p.o. intake which could have contributed to AKI in the past patient have had similar presentation when her CO2 goes up she stops eating and drinking and that resulted in the past we have also AKI  Benign essential HTN Allow permissive hypertension for tonight  Chronic respiratory failure (HCC) Chronic respiratory superiorly secondary to history COPD on 4 L at baseline  History of DVT (deep vein thrombosis) No longer on anticoagulation has had recent falls  Obesity, Class III, BMI 40-49.9 (morbid obesity) Contributing to comorbidity and complicating medical management  Body mass index is 47.3 kg/m.  Nutritional follow up as an out pt would be recommended   OSA (obstructive sleep apnea) Per family she was told she has she does not have sleep apnea  Nasal fracture Will need follow-up as an outpatient with ENT Change Augmentin  to Unasyn  while difficult to take p.o.  Elevated LFTs Check CK Hepatitis serologies CT abdomen pending  Nonobstructive pattern Sent message to [facility] given severity Check acetaminophen  level INR and ammonia level Hold statin Patient has no history of alcohol abuse Could be in the setting of severe dehydration as patient have had very little p.o. intake for past few days    Other plan as per orders.  DVT prophylaxis:  SCD      Code Status:    Code Status: Prior FULL CODE  as per patient   I had personally discussed CODE STATUS with patient and family   ACP   none    Family Communication:   Family  at  Bedside  plan of care was discussed  with  grand Daughter,   Diet  npo   Disposition Plan:      To home once workup is complete and patient is stable   Following barriers for discharge:  Electrolytes corrected                               Anemia corrected h/H stable                                                       Will need consultants to evaluate patient prior to discharge                           Work of breathing improves       Consult Orders  (From admission, onward)           Start     Ordered   02/23/24 2253  Consult to hospitalist  Paged by lonell  Once       Provider:  (Not yet assigned)  Question Answer Comment  Place call to: [facility]   Reason for Consult Admit      02/23/24 2252                               Would benefit from PT/OT eval prior to DC  Ordered                                      Nutrition    consulted                                   Consults called: ***    Send msg to Pulmonoloygy  Send msg to GI  Admission status:  ED Disposition     ED Disposition  Admit   Condition  --   Comment  Hospital Area: [facility] [[id number]]  Level of Care: Progressive [102]  Admit to Progressive based on following criteria: NEUROLOGICAL AND NEUROSURGICAL complex patients with significant risk of instability, who do not meet ICU criteria, yet require close observation or frequent assessment (< / = every 2 - 4 hours) with medical / nursing intervention.  Admit to Progressive based on following criteria: NEPHROLOGY stable condition requiring close monitoring for AKI, requiring Hemodialysis or Peritoneal Dialysis either from expected electrolyte imbalance, acidosis, or fluid overload that can be managed by NIPPV or high flow oxygen.  May admit patient to [facility] or [facility] if equivalent level of care is available:: No  Covid Evaluation: Asymptomatic - no recent exposure (last 10 days) testing not required  Diagnosis: AKI (acute kidney injury) [facility]) [[id number]]  Admitting Physician: Vincent Ehrler [[id number]]  Attending Physician: Cannen Dupras [[id number]]   Certification:: I certify this patient will need inpatient services for at least 2 midnights  Expected Medical Readiness: 02/26/2024             inpatient     I Expect 2 midnight stay secondary to severity of patient's current illness need for inpatient interventions justified by the following:  hemodynamic instability despite optimal treatment ( hypotension hypoxia, hypercapnia)   Severe lab/radiological/exam abnormalities including:    Respiratory acidosis  Elevated liver enzymes    AKI    and extensive  comorbidities including:  CHF  COPD/asthma  Morbid Obesity  That are currently affecting medical management.   I expect  patient to be hospitalized for 2 midnights requiring inpatient medical care.  Patient is at high risk for adverse outcome (such as loss of life or disability) if not treated.  Indication for inpatient stay as follows:  Severe change from baseline regarding mental status Hemodynamic instability despite maximal medical therapy,  severe pain requiring acute inpatient management,  inability to maintain oral hydration   persistent chest pain despite medical management Need for operative/procedural  intervention New or worsening hypoxia ongoing suicidal ideations   Need for IV antibiotics, IV fluids,, IV pain medications, IV anticoagulation,  IV rate controling medications, IV antihypertensives need for biPAP Frequent labs    Level of care     progressive        Critical***  Patient is critically ill due to  hemodynamic instability * respiratory failure *severe sepsis* ongoing chest pain*  They are at high risk for life/limb threatening clinical deterioration requiring frequent reassessment and modifications of care.  Services provided include examination of the patient, review of relevant ancillary tests, prescription of lifesaving therapies, review of medications and prophylactic therapy.  Total critical care time excluding separately billable  procedures: 60*  Minutes.    Deysi Soldo 02/23/2024, 11:12 PM ***  [facility]     after 2 AM please page floor coverage   If 7AM-7PM, please contact the day team taking care of the patient using [email or web address]

## 2024-02-24 NOTE — Procedures (Signed)
 Extubation Procedure Note  Patient Details:   Name: [name] DOB: Dec 06, 1955 MRN: [id number]   Airway Documentation:    Vent end date: 02/24/24 Vent end time: 1615   Evaluation  O2 sats: stable throughout Complications: No apparent complications Patient did tolerate procedure well. Bilateral Breath Sounds: Clear, Diminished   Yes Pt extubated per MD order. Positive cuff leak noted Saad Buhl V 02/24/2024, 4:20 PM

## 2024-02-24 NOTE — Assessment & Plan Note (Signed)
 Continue home meds No wheezing to suggest an acute exacerbation Hypercarbia likely in the setting of having a Rhino Rocket placed secondary to significant nasal bleed

## 2024-02-24 NOTE — Procedures (Signed)
 Intubation Procedure Note  [name]  [id number]  Jan 06, 1956  Date:02/24/24  Time:3:56 AM   Provider Performing:Brittain Hosie Maree    Procedure: Intubation (31500)  Indication(s) Respiratory Failure  Consent Risks of the procedure as well as the alternatives and risks of each were explained to the patient and/or caregiver.  Consent for the procedure was obtained and is signed in the bedside chart   Anesthesia Etomidate , Versed , and Rocuronium    Time Out Verified patient identification, verified procedure, site/side was marked, verified correct patient position, special equipment/implants available, medications/allergies/relevant history reviewed, required imaging and test results available.   Sterile Technique Usual hand hygeine, masks, and gloves were used   Procedure Description Patient positioned in bed supine.  Sedation given as noted above.  Patient was intubated with endotracheal tube using Glidescope.  View was Grade 1 full glottis .  Number of attempts was 1.  Colorimetric CO2 detector was consistent with tracheal placement.   Complications/Tolerance None; patient tolerated the procedure well. Chest X-ray is ordered to verify placement.   EBL 0   Specimen(s) None

## 2024-02-24 NOTE — Progress Notes (Signed)
 PHARMACY NOTE:  ANTIMICROBIAL RENAL DOSAGE ADJUSTMENT  Current antimicrobial regimen includes a mismatch between antimicrobial dosage and estimated renal function.  As per policy approved by the Pharmacy & Therapeutics and Medical Executive Committees, the antimicrobial dosage will be adjusted accordingly.  Current antimicrobial dosage:  Unasyn  1.5g IV q8  Indication: COPD  Renal Function:  Estimated Creatinine Clearance: 30.7 mL/min (A) (by C-G formula based on SCr of 2.34 mg/dL (H)). []      On intermittent HD, scheduled: []      On CRRT    Antimicrobial dosage has been changed to:  Unasyn  3g IV q8  Additional comments:   Sergio Batch, PharmD, BCIDP, AAHIVP, CPP Infectious Disease Pharmacist 02/24/2024 8:03 AM

## 2024-02-24 NOTE — Progress Notes (Signed)
 NAME:  [name], MRN:  [id number], DOB:  [birth year], LOS: 1 ADMISSION DATE:  02/23/2024, CONSULTATION DATE:  02/24/2024 REFERRING MD: Silvester - TR[facility], CHIEF COMPLAINT:  AMS  History of Present Illness:  68 y/o female who has a PMH COPD on 4 liters home O2 who has had intubation and tracheostomy secondary to flu in the past who presented x 1 week ago after a fall and she fell on her face.  Then yesterday she came in with significant nose bleed and sent home with a rhinojet.  She is on Plavix  since a previous admission where her troponins were elevated but she did not have a cath or stents but was kept on the Plavix  and never stopped it.  Her hearing also gets worse when her CO2 go up.  Then next day she was lethargic and family noticed she was altered.  EMS noted her to be disoriented with time and situation.  She was placed on BiPAP.  Pertinent Medical History:  HTN, COPD on 4 liters home O2, DVT, H1N1 Flu resulting in intubation and tracheostomy  Significant Hospital Events: Including procedures, antibiotic start and stop dates in addition to other pertinent events   7/21 - Admit to ICU. Intubated for airway protection and respiratory support.   Interim History / Subjective:  Intubated overnight for respiratory failure Presumed CO2 narcosis on a background of COPD This AM, weaning FiO2 and sedation Following commands Epistaxis appears stable with Rhino Rocket in place Ongoing elevated LFTs, moderate AKI ABG improved Working toward extubation  Objective:   Blood pressure 113/69, pulse 80, temperature 98.9 F (37.2 C), temperature source Axillary, resp. rate (!) 24, height 5' 4 (1.626 m), weight 123.5 kg, SpO2 100%.    Vent Mode: PRVC FiO2 (%):  [40 %-100 %] 60 % Set Rate:  [18 bmp-22 bmp] 20 bmp Vt Set:  [470 mL] 470 mL PEEP:  [8 cmH20] 8 cmH20 Pressure Support:  [8 cmH20] 8 cmH20 Plateau Pressure:  [21 cmH20-29 cmH20] 21 cmH20   Intake/Output Summary (Last 24 hours) at  02/24/2024 0746 Last data filed at 02/24/2024 0600 Gross per 24 hour  Intake 163.41 ml  Output 270 ml  Net -106.59 ml   Filed Weights   02/23/24 2017 02/24/24 0318  Weight: 125 kg 123.5 kg   Physical Examination: General: Acutely ill-appearing older woman in NAD. HEENT: Normocephalic, anicteric sclera, PERRL 4mm, moist mucous membranes. Facial ecchymosis noted bilaterally. Rhino Rocket present in R naris with dried blood present. Healing abrasion noted to nasal tip. Neuro: Awake, intubated and mildly sedated. Responds to verbal stimuli. Following commands consistently. Moves all 4 extremities spontaneously. Mild generalized weakness. +Cough and +Gag  CV: RRR, no m/g/r. PULM: Breathing even and unlabored on vent (PEEP 8, FiO2 60% -> 40%). Lung fields diminished at bilateral bases, otherwise clear to auscultation. GI: Soft, nontender, nondistended. Normoactive bowel sounds. Extremities: Bilateral symmetric trace LE edema noted. Skin: Warm/dry, scattered ecchymosis to LLE, face (as above).  Resolved Problem List:   Assessment and Plan:  AMS, likely 2/2 CO2 narcosis Likely from elevated CO2, mentation did improve on the BiPAP. Ultimately required intubation. - Intubated for AMS, respiratory failure - Improving with mechanical ventilation support - Weaning/limiting sedating medications as able - Correct metabolic derangements - Follow ABG  Acute on chronic hypercapnic respiratory failure COPD - Continue full vent support (4-8cc/kg IBW) - Wean FiO2 for O2 sat > 90%; weaning FiO2 as able to facilitate SBT - Daily WUA/SBT as appropriate, may be able to  extubate today pending vent support weaning - Bronchodilators PRN - VAP bundle - Pulmonary hygiene - PAD protocol for sedation: Propofol  and Fentanyl  for goal RASS 0 to -1, sedation nearly off at present - Coverage with Unasyn  in the setting of ?aspiration with epistaxis and nasal packing in place - Follow ABG, CXR  CHF by history  Grade I diastolic dysfunction HTN - Cardiac monitoring - Hold home antihypertensives at present - Monitor I&Os - Diuresis held as patient has significant AKI at present  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 - Trend LFTs  Epistaxis History of DVT PE S/p Rhino Rocket in place. - Unasyn  for prophylactic coverage with indwelling nasal packing - Not on AC at present - Plavix  held for now  Chronic anemia - Trend H&H - Monitor for signs of active bleeding - Transfuse for Hgb < 7.0 or hemodynamically significant bleeding  Morbid obesity class III - Lifestyle modification as indicated  Best Practice (right click and Reselect all SmartList Selections daily)   Diet/type: NPO DVT prophylaxis: SCDs, SQH Pressure ulcer(s): N/A GI prophylaxis: N/A Lines: N/A Foley:  N/A Code Status:  full code  Critical care time:    The patient is critically ill with multiple organ system failure and requires high complexity decision making for assessment and support, frequent evaluation and titration of therapies, advanced monitoring, review of radiographic studies and interpretation of complex data.   Critical Care Time devoted to patient care services, exclusive of separately billable procedures, described in this note is 37 minutes.  Corean CHRISTELLA Catrinia Racicot, PA-C [facility] 02/24/24 7:46 AM  Please see [email or web address] for pager details.  From 7A-7P if no response, please call [phone] After hours, please call ELink [phone]

## 2024-02-24 NOTE — Progress Notes (Signed)
 Transition of Care [facility]) - Inpatient Brief Assessment   Patient Details  Name: [name] MRN: [id number] Date of Birth: [birth year]  Transition of [facility]) CM/SW Contact:    Robynn Eileen Hoose, RN Phone Number: 02/24/2024, 3:41 PM   Clinical Narrative:  Patient from home with c/o altered mental status. Patient has home oxygen at 4 L.  Patient has history of falls. Progression rounds discussion; possible extubation tomorrow.  Transition of Care Asessment: Insurance and Status: (P) Insurance coverage has been reviewed Patient has primary care physician: (P) Yes Home environment has been reviewed: (P) Home Prior level of function:: (P) Independent with fall history Prior/Current Home Services: (P) No current home services Social Drivers of Health Review: (P) SDOH reviewed no interventions necessary Readmission risk has been reviewed: (P) Yes Transition of care needs: (P) no transition of care needs at this time

## 2024-02-24 NOTE — Progress Notes (Signed)
 PT Cancellation Note  Patient Details Name: [name] MRN: [id number] DOB: 12-07-1955   Cancelled Treatment:    Reason Eval/Treat Not Completed: (P) Fatigue/lethargy limiting ability to participate Pt remains on vent and has limited command follow. PT will follow back for appropriateness tomorrow.  Idrees Quam B. [name] Lapidus PT, DPT Acute Rehabilitation Services Please use secure chat or  Call Office [phone]    [name] [name] [facility] 02/24/2024, 3:11 PM

## 2024-02-24 NOTE — Assessment & Plan Note (Signed)
 No longer on anticoagulation has had recent falls

## 2024-02-24 NOTE — Assessment & Plan Note (Signed)
 Rhino Rocket in place Change Augmentin  to Unasyn  for now Hold Plavix  Plavix  was started last year when patient did have NSTEMI in the setting of respiratory failure Per family she has no stents Plavix  was continued No recent chest pain or shortness of breath

## 2024-02-24 NOTE — ED Notes (Signed)
 Report called to [place], Charity fundraiser.

## 2024-02-24 NOTE — Progress Notes (Signed)
 eLink Physician-Brief Progress Note Patient Name: Chandel Zaun DOB: [birth year] MRN: [id number]   Date of Service  02/24/2024  HPI/Events of Note  68 year old female with a history of COPD with prior intubation requiring tracheostomy.  She has also been on Plavix  for elevated troponins even though she had no percutaneous intervention done.  She recently had a nosebleed for which she had a Rhino Rocket rocket placed.  She came in from home with altered mental status found to have hypercapnia.  Initially placed on noninvasive ventilation without improvement.  Subsequently intubated.  Hemodynamically stable.  eICU Interventions  Patient's chart reviewed.  Pertinent labs and imaging studies reviewed.  Reassessment of patient done.  Patient resting comfortably intubated.  Sedation ordered. Impression: CO2 narcosis Acute on chronic respiratory failure with hypercapnia COPD     Intervention Category Evaluation Type: New Patient Evaluation  Jerilynn Berg 02/24/2024, 4:10 AM

## 2024-02-24 NOTE — Progress Notes (Signed)
 Initial Nutrition Assessment  DOCUMENTATION CODES:  Morbid obesity  INTERVENTION:  If pt does not extubate and tube feeding is warranted, recommend: Starting Osmolite 1.5 at 20ml/hr advancing by 10ml q8h to goal rate of 14ml/hr ( per day) 60ml ProSource TF20 BID Provides 1600 kcal, 100g protein, free water daily  NUTRITION DIAGNOSIS:  Inadequate oral intake related to acute illness as evidenced by NPO status.  GOAL:  Patient will meet greater than or equal to 90% of their needs  MONITOR:  Vent status, Labs, Weight trends, TF tolerance  REASON FOR ASSESSMENT:  Consult, Ventilator Assessment of nutrition requirement/status  ASSESSMENT:  Pt admitted with AMS with recent fall. PMH significant for HTN, COPD, DVT, H1N1 flu.  7/21: admitted; intubated  Patient is currently intubated on ventilator support MV: 9.35 L/min Temp (24hrs), Avg:98.6 F (37 C), Min:97.7 F (36.5 C), Max:99.1 F (37.3 C)  Patient discussed in interdisciplinary rounds.  Plans for extubation today versus tomorrow.  Hold on initiation of tube feeding for now.   No family present at bedside at time of visit.  Unable to obtain detailed nutrition related history at this time.   Limited weight history on file to review within the last year.  Of the limited weights, it appears pt's weight has remained stable overall. Will continue to monitor throughout admission.   Drains/lines: OGT  Medications: colace BID, pepcid , folvite , MVI, senna BID, thiamine  Drips: Abx x1 Propofol  @ 7.20ml/hr (providing 196kcal/d from lipids at current rate)  Labs:  BUN 42 Cr 2.34 Phosphorus 4.9 AST 2178 ALT 1915 Ammonia 43 (7/20) GFR 22 CBG's 84-111 x12 hours  NUTRITION - FOCUSED PHYSICAL EXAM: Flowsheet Row Most Recent Value  Orbital Region No depletion  Upper Arm Region No depletion  Thoracic and Lumbar Region No depletion  Buccal Region No depletion  Temple Region No depletion  Clavicle Bone Region  No depletion  Clavicle and Acromion Bone Region No depletion  Scapular Bone Region No depletion  Dorsal Hand No depletion  Patellar Region No depletion  Anterior Thigh Region No depletion  Posterior Calf Region No depletion  Edema (RD Assessment) Mild  [mild generalized]  Hair Reviewed  Eyes Unable to assess  Mouth Unable to assess  Skin Reviewed  Nails Reviewed   Diet Order:   Diet Order             Diet NPO time specified  Diet effective now                   EDUCATION NEEDS:  No education needs have been identified at this time  Skin:  Skin Assessment: Reviewed RN Assessment  Last BM:  unknown/PTA  Height:  Ht Readings from Last 1 Encounters:  02/24/24 5' 4 (1.626 m)    Weight:  Wt Readings from Last 1 Encounters:  02/24/24 123.5 kg    Ideal Body Weight:  54.5 kg  BMI:  Body mass index is 46.73 kg/m.  Estimated Nutritional Needs:   Kcal:  1500-1700  Protein:  85-100g  Fluid:  >/=1.5L  Royce Maris, RDN, LDN Clinical Nutrition See AMiON for contact information. \

## 2024-02-24 NOTE — Assessment & Plan Note (Signed)
 Contributing to comorbidity and complicating medical management  Body mass index is 47.3 kg/m.  Nutritional follow up as an out pt would be recommended

## 2024-02-25 DIAGNOSIS — N179 Acute kidney failure, unspecified: Secondary | ICD-10-CM | POA: Diagnosis not present

## 2024-02-25 DIAGNOSIS — N189 Chronic kidney disease, unspecified: Secondary | ICD-10-CM | POA: Diagnosis not present

## 2024-02-25 DIAGNOSIS — R4182 Altered mental status, unspecified: Secondary | ICD-10-CM | POA: Diagnosis not present

## 2024-02-25 DIAGNOSIS — J449 Chronic obstructive pulmonary disease, unspecified: Secondary | ICD-10-CM | POA: Diagnosis not present

## 2024-02-25 LAB — CBC WITH DIFFERENTIAL/PLATELET
Abs Immature Granulocytes: 0.06 K/uL (ref 0.00–0.07)
Basophils Absolute: 0 K/uL (ref 0.0–0.1)
Basophils Relative: 1 %
Eosinophils Absolute: 0.1 K/uL (ref 0.0–0.5)
Eosinophils Relative: 1 %
HCT: 30.2 % — ABNORMAL LOW (ref 36.0–46.0)
Hemoglobin: 9.7 g/dL — ABNORMAL LOW (ref 12.0–15.0)
Immature Granulocytes: 1 %
Lymphocytes Relative: 14 %
Lymphs Abs: 0.9 K/uL (ref 0.7–4.0)
MCH: 29.3 pg (ref 26.0–34.0)
MCHC: 32.1 g/dL (ref 30.0–36.0)
MCV: 91.2 fL (ref 80.0–100.0)
Monocytes Absolute: 0.5 K/uL (ref 0.1–1.0)
Monocytes Relative: 7 %
Neutro Abs: 5.3 K/uL (ref 1.7–7.7)
Neutrophils Relative %: 76 %
Platelets: 166 K/uL (ref 150–400)
RBC: 3.31 MIL/uL — ABNORMAL LOW (ref 3.87–5.11)
RDW: 13.6 % (ref 11.5–15.5)
WBC: 6.8 K/uL (ref 4.0–10.5)
nRBC: 0.6 % — ABNORMAL HIGH (ref 0.0–0.2)

## 2024-02-25 LAB — COMPREHENSIVE METABOLIC PANEL WITH GFR
ALT: 1057 U/L — ABNORMAL HIGH (ref 0–44)
ALT: 990 U/L — ABNORMAL HIGH (ref 0–44)
AST: 494 U/L — ABNORMAL HIGH (ref 15–41)
AST: 299 U/L — ABNORMAL HIGH (ref 15–41)
Albumin: 2.7 g/dL — ABNORMAL LOW (ref 3.5–5.0)
Albumin: 2.9 g/dL — ABNORMAL LOW (ref 3.5–5.0)
Alkaline Phosphatase: 54 U/L (ref 38–126)
Alkaline Phosphatase: 62 U/L (ref 38–126)
Anion gap: 10 (ref 5–15)
Anion gap: 11 (ref 5–15)
BUN: 45 mg/dL — ABNORMAL HIGH (ref 8–23)
BUN: 45 mg/dL — ABNORMAL HIGH (ref 8–23)
CO2: 30 mmol/L (ref 22–32)
CO2: 32 mmol/L (ref 22–32)
Calcium: 8.7 mg/dL — ABNORMAL LOW (ref 8.9–10.3)
Calcium: 8.8 mg/dL — ABNORMAL LOW (ref 8.9–10.3)
Chloride: 98 mmol/L (ref 98–111)
Chloride: 98 mmol/L (ref 98–111)
Creatinine, Ser: 1.89 mg/dL — ABNORMAL HIGH (ref 0.44–1.00)
Creatinine, Ser: 1.69 mg/dL — ABNORMAL HIGH (ref 0.44–1.00)
GFR, Estimated: 29 mL/min — ABNORMAL LOW (ref >60)
GFR, Estimated: 33 mL/min — ABNORMAL LOW (ref >60)
Glucose, Bld: 132 mg/dL — ABNORMAL HIGH (ref 70–99)
Glucose, Bld: 128 mg/dL — ABNORMAL HIGH (ref 70–99)
Potassium: 3.9 mmol/L (ref 3.5–5.1)
Potassium: 4.1 mmol/L (ref 3.5–5.1)
Sodium: 138 mmol/L (ref 135–145)
Sodium: 141 mmol/L (ref 135–145)
Total Bilirubin: 1 mg/dL (ref 0.0–1.2)
Total Bilirubin: 0.8 mg/dL (ref 0.0–1.2)
Total Protein: 5.3 g/dL — ABNORMAL LOW (ref 6.5–8.1)
Total Protein: 5.5 g/dL — ABNORMAL LOW (ref 6.5–8.1)

## 2024-02-25 LAB — PROTIME-INR
INR: 1.1 (ref 0.8–1.2)
Prothrombin Time: 14.8 s (ref 11.4–15.2)

## 2024-02-25 LAB — GLUCOSE, CAPILLARY
Glucose-Capillary: 109 mg/dL — ABNORMAL HIGH (ref 70–99)
Glucose-Capillary: 112 mg/dL — ABNORMAL HIGH (ref 70–99)
Glucose-Capillary: 117 mg/dL — ABNORMAL HIGH (ref 70–99)
Glucose-Capillary: 129 mg/dL — ABNORMAL HIGH (ref 70–99)
Glucose-Capillary: 67 mg/dL — ABNORMAL LOW (ref 70–99)
Glucose-Capillary: 81 mg/dL (ref 70–99)
Glucose-Capillary: 98 mg/dL (ref 70–99)

## 2024-02-25 LAB — MAGNESIUM: Magnesium: 2 mg/dL (ref 1.7–2.4)

## 2024-02-25 LAB — PHOSPHORUS: Phosphorus: 2.5 mg/dL (ref 2.5–4.6)

## 2024-02-25 LAB — CK: Total CK: 381 U/L — ABNORMAL HIGH (ref 38–234)

## 2024-02-25 MED ORDER — ADULT MULTIVITAMIN W/MINERALS CH
1.0000 | ORAL_TABLET | Freq: Every day | ORAL | Status: DC
  Administered 2024-02-25 – 2024-02-28 (×4): 1 via ORAL
  Filled 2024-02-25 (×4): qty 1

## 2024-02-25 MED ORDER — AMOXICILLIN-POT CLAVULANATE 500-125 MG PO TABS
1.0000 | ORAL_TABLET | Freq: Two times a day (BID) | ORAL | Status: DC
  Administered 2024-02-25 – 2024-02-27 (×5): 1 via ORAL
  Filled 2024-02-25 (×7): qty 1

## 2024-02-25 MED ORDER — FOLIC ACID 1 MG PO TABS
1.0000 mg | ORAL_TABLET | Freq: Every day | ORAL | Status: DC
  Administered 2024-02-25 – 2024-02-28 (×4): 1 mg via ORAL
  Filled 2024-02-25 (×3): qty 1

## 2024-02-25 MED ORDER — SENNA 8.6 MG PO TABS
1.0000 | ORAL_TABLET | Freq: Two times a day (BID) | ORAL | Status: DC
  Administered 2024-02-25 – 2024-02-26 (×2): 8.6 mg via ORAL
  Filled 2024-02-25 (×5): qty 1

## 2024-02-25 NOTE — Plan of Care (Signed)

## 2024-02-25 NOTE — Progress Notes (Signed)
 Heart Failure Navigator Progress Note  Assessed for Heart & Vascular TOC clinic readiness.  Patient does not meet criteria due to EF 60-65%, admitted with Respiratory acidosis. No HF TOC. .   Navigator will sign off at this time.   Stephane Haddock, BSN, Scientist, clinical (histocompatibility and immunogenetics) Only

## 2024-02-25 NOTE — Evaluation (Signed)
 Occupational Therapy Evaluation Patient Details Name: [name] MRN: [id number] DOB: Dec 27, 1955 Today's Date: 02/25/2024   History of Present Illness   Pt is a 67 y.o. F presenting to [facility] on 02/23/24 following altered mental status. Pt was recently seen following a fall at home 1 week ago. On 02/24/24 pt was intubated and extubated. PMH is significant for  HTN, COPD on 4L at home, and flu resulting in intubation and tracheostomy.     Clinical Impressions PTA, pt lived with her spouse and reports being independent in ADL and IADL with use of compensatory strategies for activity tolerance as needed (I.e. grocery pickup). Upon eval, pt with decr activity tolerance as compared to her baseline. Needing up to CGA for OOB ADL. Will continue to follow acutely; recommending initial HHOT eval.      If plan is discharge home, recommend the following:   A little help with walking and/or transfers;A little help with bathing/dressing/bathroom;Assistance with cooking/housework;Assist for transportation;Help with stairs or ramp for entrance     Functional Status Assessment   Patient has had a recent decline in their functional status and demonstrates the ability to make significant improvements in function in a reasonable and predictable amount of time.     Equipment Recommendations   None recommended by OT     Recommendations for Other Services         Precautions/Restrictions   Precautions Precautions: Fall Recall of Precautions/Restrictions: Intact Restrictions Weight Bearing Restrictions Per Provider Order: No     Mobility Bed Mobility Overal bed mobility: Needs Assistance Bed Mobility: Supine to Sit     Supine to sit: Supervision, HOB elevated     General bed mobility comments: increased time to complete    Transfers Overall transfer level: Needs assistance Equipment used: None Transfers: Sit to/from Stand Sit to Stand: Contact guard assist            General transfer comment: STS from edge of bed and recliner w/out an AD. Pt pushes up from surface using BUE and demonstrates good anterior trunk lean for hip clearance when initiating stand. Increased time to complete.      Balance Overall balance assessment: Needs assistance Sitting-balance support: No upper extremity supported, Feet supported Sitting balance-Leahy Scale: Fair Sitting balance - Comments: seated EOB   Standing balance support: No upper extremity supported, During functional activity Standing balance-Leahy Scale: Fair Standing balance comment: able to shift weight in standing for gait, but can't accept any challenge                           ADL either performed or assessed with clinical judgement   ADL Overall ADL's : Needs assistance/impaired Eating/Feeding: Independent;Sitting   Grooming: Oral care;Contact guard assist;Supervision/safety;Standing   Upper Body Bathing: Set up;Sitting   Lower Body Bathing: Contact guard assist;Sit to/from stand   Upper Body Dressing : Set up;Sitting   Lower Body Dressing: Contact guard assist;Sit to/from stand   Toilet Transfer: Contact guard assist;Ambulation           Functional mobility during ADLs: Contact guard assist       Vision Baseline Vision/History: 0 No visual deficits Ability to See in Adequate Light: 0 Adequate Patient Visual Report: No change from baseline Vision Assessment?: No apparent visual deficits     Perception Perception: Within Functional Limits       Praxis Praxis: WFL       Pertinent Vitals/Pain Pain Assessment Pain Assessment: Faces Faces  Pain Scale: Hurts little more Pain Location: nose Pain Descriptors / Indicators: Constant, Discomfort Pain Intervention(s): Limited activity within patient's tolerance, Monitored during session     Extremity/Trunk Assessment Upper Extremity Assessment Upper Extremity Assessment: Generalized weakness   Lower Extremity  Assessment Lower Extremity Assessment: Defer to PT evaluation   Cervical / Trunk Assessment Cervical / Trunk Assessment: Kyphotic   Communication Communication Communication: No apparent difficulties   Cognition Arousal: Alert Behavior During Therapy: WFL for tasks assessed/performed Cognition: No apparent impairments                               Following commands: Intact       Cueing  General Comments   Cueing Techniques: Verbal cues;Visual cues      Exercises     Shoulder Instructions      Home Living Family/patient expects to be discharged to:: Private residence Living Arrangements: Spouse/significant other Available Help at Discharge: Family;Available 24 hours/day Type of Home: House Home Access: Stairs to enter [employer] of Steps: 4 Entrance Stairs-Rails: Right;Left;Can reach both Home Layout: Two level Alternate Level Stairs-Number of Steps: 14 Alternate Level Stairs-Rails: Right;Left Bathroom Shower/Tub: Producer, television/film/video: Standard Bathroom Accessibility: Yes   Home Equipment: BSC/3in1;Shower seat          Prior Functioning/Environment Prior Level of Function : Independent/Modified Independent;Driving (retired Dentist)             Mobility Comments: independent ADLs Comments: independent; 4-4.5 L supplementary O2 at baseline    OT Problem List: Decreased strength;Decreased activity tolerance;Impaired balance (sitting and/or standing);Decreased knowledge of use of DME or AE;Cardiopulmonary status limiting activity   OT Treatment/Interventions: Self-care/ADL training;Therapeutic exercise;DME and/or AE instruction;Energy conservation;Therapeutic activities;Balance training;Patient/family education      OT Goals(Current goals can be found in the care plan section)   Acute Rehab OT Goals Patient Stated Goal: get better OT Goal Formulation: With patient Time For Goal Achievement:  03/10/24 Potential to Achieve Goals: Good   OT Frequency:  Min 2X/week    Co-evaluation              AM-PAC OT 6 Clicks Daily Activity     Outcome Measure Help from another person eating meals?: None Help from another person taking care of personal grooming?: A Little Help from another person toileting, which includes using toliet, bedpan, or urinal?: A Little Help from another person bathing (including washing, rinsing, drying)?: A Little Help from another person to put on and taking off regular upper body clothing?: A Little Help from another person to put on and taking off regular lower body clothing?: A Little 6 Click Score: 19   End of Session Equipment Utilized During Treatment: Oxygen (4L) Nurse Communication: Mobility status  Activity Tolerance: Patient tolerated treatment well Patient left: in bed;with call bell/phone within reach;with bed alarm set;with family/visitor present  OT Visit Diagnosis: Unsteadiness on feet (R26.81);Muscle weakness (generalized) (M62.81);Other (comment) (decr activity tolerance)                Time: 1419-1450 OT Time Calculation (min): 31 min Charges:  OT General Charges $OT Visit: 1 Visit OT Evaluation $OT Eval Moderate Complexity: 1 Mod OT Treatments $Self Care/Home Management : 8-22 mins  Elma JONETTA Lebron FREDERICK, OTR/L [facility] Acute Rehabilitation Office: [phone]   Elma JONETTA Lebron 02/25/2024, 3:08 PM

## 2024-02-25 NOTE — Progress Notes (Signed)
 LFTs downtrending.  Suspect rhabdomyolysis versus shock liver as sources pronounced elevated LFTs.  INR ok.  [facility] will follow at a distance; no further GI work-up anticipated, in absence of new/interval worsening of symptoms/labs.

## 2024-02-25 NOTE — Progress Notes (Signed)
 NAME:  [name], MRN:  [id number], DOB:  08/16/1955, LOS: 2 ADMISSION DATE:  02/23/2024, CONSULTATION DATE:  02/24/2024 REFERRING MD: Silvester - TRH, CHIEF COMPLAINT:  AMS  History of Present Illness:  68 y/o female who has a PMH COPD on 4 liters home O2 who has had intubation and tracheostomy secondary to flu in the past who presented x 1 week ago after a fall and she fell on her face.  Then yesterday she came in with significant nose bleed and sent home with a rhinojet.  She is on Plavix  since a previous admission where her troponins were elevated but she did not have a cath or stents but was kept on the Plavix  and never stopped it.  Her hearing also gets worse when her CO2 go up.  Then next day she was lethargic and family noticed she was altered.  EMS noted her to be disoriented with time and situation.  She was placed on BiPAP.  Pertinent Medical History:  HTN, COPD on 4L home O2, DVT, H1N1 Flu resulting in intubation and tracheostomy  Significant Hospital Events: Including procedures, antibiotic start and stop dates in addition to other pertinent events   7/21 - Admit to ICU. Intubated for airway protection and respiratory support. Extubated later in the day.  Interim History / Subjective:  Extubated to [place] yesterday BiPAP overnight, tolerated well Diet advanced, has a little bit of appetite Throat is a little hoarse and has some mild facial soreness, otherwise feeling ok D/w ENT, plan to leave nasal packing in place Continuing Augmentin   Objective:   Blood pressure 120/73, pulse 74, temperature 99.4 F (37.4 C), temperature source Oral, resp. rate 16, height 5' 4 (1.626 m), weight 128.3 kg, SpO2 97%.    Vent Mode: PSV;CPAP FiO2 (%):  [40 %-60 %] 40 % Set Rate:  [20 bmp] 20 bmp Vt Set:  [470 mL] 470 mL PEEP:  [5 cmH20-8 cmH20] 5 cmH20 Pressure Support:  [5 cmH20-8 cmH20] 5 cmH20 Plateau Pressure:  [21 cmH20] 21 cmH20   Intake/Output Summary (Last 24 hours) at 02/25/2024  0728 Last data filed at 02/25/2024 0524 Gross per 24 hour  Intake 407.77 ml  Output 1215 ml  Net -807.23 ml   Filed Weights   02/23/24 2017 02/24/24 0318 02/25/24 0520  Weight: 125 kg 123.5 kg 128.3 kg   Physical Examination: General: Acutely ill-appearing older woman in NAD. Pleasant and conversant. HEENT: Normocephalic, anicteric sclera, PERRL, moist mucous membranes. Bilateral facial ecchymosis noted, most prominent around eyes with abrasion on nasal bridge. Neuro: Awake, oriented x 4. Responds to verbal stimuli. Following commands consistently. Moves all 4 extremities spontaneously. Generalized weakness. CV: RRR, no m/g/r. PULM: Breathing even and unlabored on 4LNC. Lung fields diminished at bilateral bases, clear in upper fields. GI: Obese, soft, nontender, nondistended. Normoactive bowel sounds. Extremities: Bilateral trace symmetric LE edema noted. Skin: Warm/dry, scattered ecchymosis.  Resolved Problem List:   Assessment and Plan:  AMS, likely 2/2 CO2 narcosis Likely from elevated CO2, mentation did improve on the BiPAP. Ultimately required intubation for AMS, respiratory failure. - Improved with MV support, subsequently extubated 7/21PM - Limit sedating medications as able - Correct metabolic derangements, improving significantly  - Likely stable for transfer to progressive level of care 7/22  Acute on chronic hypercapnic respiratory failure COPD - Extubated 7/21PM to [place], tolerated well - BiPAP at bedtime - Supplemental O2 support to maintain O2 sat > 90% - Bronchodilators PRN - Pulmonary hygiene - Unasyn  -> Augmentin  for empiric  TSS coverage   CHF by history Grade I diastolic dysfunction HTN - Cardiac monitoring - Hold home antihypertensives at present, resume as clinically appropriate - Monitor I&Os - Diuresis held in the setting of AKI; AKI improving, resume as tolerated  AKI on mild CKD stage, likely ATN in the setting of critical illness; improving Mild  hyperkalemia - Trend BMP - Replete electrolytes as indicated - Monitor I&Os - Avoid nephrotoxic agents as able - Ensure adequate renal perfusion  Transaminitis Hepatic steatosis per U/S and negative CT abdomen. - Trend LFTs  Epistaxis History of DVT PE Empiric TSS coverage S/p Rhino Rocket in place. - ENT following, nasal packing to remain in place for now; tentative removal 7/24 - Unasyn  for prophylactic coverage with indwelling nasal packing; transition to Augmentin  when tolerating PO - Not on AC at present - Plavix  resumption per ENT  Chronic anemia - Trend H&H - Monitor for signs of active bleeding - Transfuse for Hgb < 7.0 or hemodynamically significant bleeding  Morbid obesity class III - Lifestyle modification as indicated  Best Practice (right click and Reselect all SmartList Selections daily)   Diet/type: NPO DVT prophylaxis: SCDs, SQH Pressure ulcer(s): N/A GI prophylaxis: N/A Lines: N/A Foley:  N/A Code Status:  full code  Critical care time: N/A   Corean CHRISTELLA Ilah DEVONNA [facility] 02/25/24 7:28 AM  Please see [email or web address] for pager details.  From 7A-7P if no response, please call [phone] After hours, please call ELink [phone]

## 2024-02-25 NOTE — Evaluation (Signed)
 Physical Therapy Evaluation Patient Details Name: [name] MRN: [id number] DOB: [birth year] Today's Date: 02/25/2024  History of Present Illness  Pt is a 68 y.o. F presenting to [facility] on 02/23/24 following altered mental status. Pt was recently seen following a fall at home 1 week ago. On 02/24/24 pt was intubated and extubated. PMH is significant for  HTN, COPD on 4L at home, and flu resulting in intubation and tracheostomy.  Clinical Impression  Prior to admittance, pt was independent w/ mobility, not using an AD and reports independence w/ ADLs. Pt presents to evaluation with deficits in mobility, strength, power, balance, activity tolerance, and pain, all limiting pt's ability to mobilize near baseline. Pt was able to ambulate short distances w/out an AD, but reaches out for surfaces to improve stability; plan to trial [facility] next session. PT will continue to treat pt while she is admitted. Recommending HHPT at discharge to address remaining mobility deficits and optimize return to PLOF.       If plan is discharge home, recommend the following: A little help with walking and/or transfers;A little help with bathing/dressing/bathroom;Assist for transportation;Help with stairs or ramp for entrance;Assistance with cooking/housework   Can travel by private vehicle        Equipment Recommendations None recommended by PT  Recommendations for Other Services       Functional Status Assessment Patient has had a recent decline in their functional status and demonstrates the ability to make significant improvements in function in a reasonable and predictable amount of time.     Precautions / Restrictions Precautions Precautions: Fall Recall of Precautions/Restrictions: Intact Restrictions Weight Bearing Restrictions Per Provider Order: No      Mobility  Bed Mobility Overal bed mobility: Needs Assistance Bed Mobility: Supine to Sit     Supine to sit: Supervision, HOB elevated     General  bed mobility comments: increased time to complete    Transfers Overall transfer level: Needs assistance Equipment used: None Transfers: Sit to/from Stand Sit to Stand: Contact guard assist           General transfer comment: STS from edge of bed and recliner w/out an AD. Pt pushes up from surface using BUE and demonstrates good anterior trunk lean for hip clearance when initiating stand. Increased time to complete.    Ambulation/Gait Ambulation/Gait assistance: Contact guard assist Gait Distance (Feet): 30 Feet Assistive device: None Gait Pattern/deviations: Step-through pattern, Decreased stride length Gait velocity: reduced Gait velocity interpretation: <1.8 ft/sec, indicate of risk for recurrent falls   General Gait Details: Pt ambulates w/ reciprocal gait pattern and reduced cadence. Pt occasionally reaches for walls or railing for improved stability; reports she does not do this at home.  Stairs            Wheelchair Mobility     Tilt Bed    Modified Rankin (Stroke Patients Only)       Balance Overall balance assessment: Needs assistance Sitting-balance support: No upper extremity supported, Feet supported Sitting balance-Leahy Scale: Fair Sitting balance - Comments: seated EOB   Standing balance support: No upper extremity supported, During functional activity Standing balance-Leahy Scale: Fair Standing balance comment: able to shift weight in standing for gait, but can't accept any challenge                             Pertinent Vitals/Pain Pain Assessment Pain Assessment: Faces Faces Pain Scale: Hurts little more Pain Location: nose Pain  Descriptors / Indicators: Constant, Discomfort Pain Intervention(s): Limited activity within patient's tolerance, Monitored during session    Home Living Family/patient expects to be discharged to:: Private residence Living Arrangements: Spouse/significant other Available Help at Discharge:  Family;Available 24 hours/day Type of Home: House Home Access: Stairs to enter Entrance Stairs-Rails: Right;Left;Can reach both Entrance Stairs-Number of Steps: 4 Alternate Level Stairs-Number of Steps: 14 Home Layout: Two level Home Equipment: BSC/3in1;Shower seat      Prior Function Prior Level of Function : Independent/Modified Independent;Driving (retired Dentist)             Mobility Comments: independent ADLs Comments: independent     Extremity/Trunk Assessment   Upper Extremity Assessment Upper Extremity Assessment: Generalized weakness    Lower Extremity Assessment Lower Extremity Assessment: Generalized weakness    Cervical / Trunk Assessment Cervical / Trunk Assessment: Kyphotic  Communication   Communication Communication: No apparent difficulties    Cognition Arousal: Alert Behavior During Therapy: WFL for tasks assessed/performed   PT - Cognitive impairments: No apparent impairments                         Following commands: Intact       Cueing Cueing Techniques: Verbal cues, Visual cues     General Comments General comments (skin integrity, edema, etc.): no signs of acute distress; on 4L    Exercises General Exercises - Lower Extremity Long Arc Quad: AROM, Both, 5 reps Heel Slides: AROM, Both, 5 reps Straight Leg Raises: AROM, Both, 5 reps   Assessment/Plan    PT Assessment Patient needs continued PT services  PT Problem List Decreased strength;Decreased activity tolerance;Decreased balance;Decreased mobility;Decreased knowledge of use of DME;Pain       PT Treatment Interventions DME instruction;Gait training;Stair training;Functional mobility training;Therapeutic activities;Therapeutic exercise;Balance training;Neuromuscular re-education;Patient/family education;Wheelchair mobility training;Manual techniques;Modalities    PT Goals (Current goals can be found in the Care Plan section)  Acute Rehab PT  Goals Patient Stated Goal: to go home PT Goal Formulation: With patient Time For Goal Achievement: 03/10/24 Potential to Achieve Goals: Good    Frequency Min 3X/week     Co-evaluation               AM-PAC PT 6 Clicks Mobility  Outcome Measure Help needed turning from your back to your side while in a flat bed without using bedrails?: A Little Help needed moving from lying on your back to sitting on the side of a flat bed without using bedrails?: A Little Help needed moving to and from a bed to a chair (including a wheelchair)?: A Little Help needed standing up from a chair using your arms (e.g., wheelchair or bedside chair)?: A Little Help needed to walk in hospital room?: A Little Help needed climbing 3-5 steps with a railing? : Total 6 Click Score: 16    End of Session Equipment Utilized During Treatment: Gait belt;Oxygen (4L) Activity Tolerance: Patient tolerated treatment well Patient left: in chair;with call bell/phone within reach;with chair alarm set Nurse Communication: Mobility status PT Visit Diagnosis: Unsteadiness on feet (R26.81);Muscle weakness (generalized) (M62.81);History of falling (Z91.81);Pain    Time: 9074-9045 PT Time Calculation (min) (ACUTE ONLY): 29 min   Charges:   PT Evaluation $PT Eval Moderate Complexity: 1 Mod   PT General Charges $$ ACUTE PT VISIT: 1 Visit         Leontine Hilt, SPT Acute Rehab [phone]   Leontine Hilt 02/25/2024, 11:48 AM

## 2024-02-26 DIAGNOSIS — R0603 Acute respiratory distress: Secondary | ICD-10-CM

## 2024-02-26 LAB — COMPREHENSIVE METABOLIC PANEL WITH GFR
ALT: 671 U/L — ABNORMAL HIGH (ref 0–44)
AST: 159 U/L — ABNORMAL HIGH (ref 15–41)
Albumin: 2.9 g/dL — ABNORMAL LOW (ref 3.5–5.0)
Alkaline Phosphatase: 63 U/L (ref 38–126)
Anion gap: 8 (ref 5–15)
BUN: 33 mg/dL — ABNORMAL HIGH (ref 8–23)
CO2: 34 mmol/L — ABNORMAL HIGH (ref 22–32)
Calcium: 8.9 mg/dL (ref 8.9–10.3)
Chloride: 100 mmol/L (ref 98–111)
Creatinine, Ser: 1.31 mg/dL — ABNORMAL HIGH (ref 0.44–1.00)
GFR, Estimated: 45 mL/min — ABNORMAL LOW (ref >60)
Glucose, Bld: 104 mg/dL — ABNORMAL HIGH (ref 70–99)
Potassium: 4 mmol/L (ref 3.5–5.1)
Sodium: 142 mmol/L (ref 135–145)
Total Bilirubin: 1 mg/dL (ref 0.0–1.2)
Total Protein: 5.6 g/dL — ABNORMAL LOW (ref 6.5–8.1)

## 2024-02-26 LAB — CBC WITH DIFFERENTIAL/PLATELET
Abs Immature Granulocytes: 0.09 K/uL — ABNORMAL HIGH (ref 0.00–0.07)
Basophils Absolute: 0 K/uL (ref 0.0–0.1)
Basophils Relative: 1 %
Eosinophils Absolute: 0.1 K/uL (ref 0.0–0.5)
Eosinophils Relative: 2 %
HCT: 30.8 % — ABNORMAL LOW (ref 36.0–46.0)
Hemoglobin: 9.7 g/dL — ABNORMAL LOW (ref 12.0–15.0)
Immature Granulocytes: 2 %
Lymphocytes Relative: 21 %
Lymphs Abs: 1.2 K/uL (ref 0.7–4.0)
MCH: 28.9 pg (ref 26.0–34.0)
MCHC: 31.5 g/dL (ref 30.0–36.0)
MCV: 91.7 fL (ref 80.0–100.0)
Monocytes Absolute: 0.5 K/uL (ref 0.1–1.0)
Monocytes Relative: 8 %
Neutro Abs: 4 K/uL (ref 1.7–7.7)
Neutrophils Relative %: 66 %
Platelets: 170 K/uL (ref 150–400)
RBC: 3.36 MIL/uL — ABNORMAL LOW (ref 3.87–5.11)
RDW: 13.5 % (ref 11.5–15.5)
WBC: 5.9 K/uL (ref 4.0–10.5)
nRBC: 0.3 % — ABNORMAL HIGH (ref 0.0–0.2)

## 2024-02-26 LAB — GLUCOSE, CAPILLARY
Glucose-Capillary: 105 mg/dL — ABNORMAL HIGH (ref 70–99)
Glucose-Capillary: 112 mg/dL — ABNORMAL HIGH (ref 70–99)
Glucose-Capillary: 161 mg/dL — ABNORMAL HIGH (ref 70–99)
Glucose-Capillary: 95 mg/dL (ref 70–99)
Glucose-Capillary: 106 mg/dL — ABNORMAL HIGH (ref 70–99)
Glucose-Capillary: 102 mg/dL — ABNORMAL HIGH (ref 70–99)

## 2024-02-26 LAB — BRAIN NATRIURETIC PEPTIDE: B Natriuretic Peptide: 76 pg/mL (ref 0.0–100.0)

## 2024-02-26 LAB — PHOSPHORUS: Phosphorus: 2.8 mg/dL (ref 2.5–4.6)

## 2024-02-26 LAB — HEPATITIS PANEL, ACUTE
HCV Ab: NONREACTIVE
Hep A IgM: NONREACTIVE
Hep B C IgM: NONREACTIVE
Hepatitis B Surface Ag: NONREACTIVE

## 2024-02-26 LAB — MAGNESIUM: Magnesium: 2.1 mg/dL (ref 1.7–2.4)

## 2024-02-26 MED ORDER — FUROSEMIDE 40 MG PO TABS: 40.0000 mg | ORAL_TABLET | Freq: Every day | ORAL | Status: DC

## 2024-02-26 MED ORDER — FUROSEMIDE 40 MG PO TABS
40.0000 mg | ORAL_TABLET | Freq: Every day | ORAL | Status: DC
  Administered 2024-02-27 – 2024-02-28 (×2): 40 mg via ORAL
  Filled 2024-02-26 (×2): qty 1

## 2024-02-26 NOTE — Plan of Care (Signed)

## 2024-02-26 NOTE — Progress Notes (Signed)
 PROGRESS NOTE    [name]  FMW:[id number] DOB: 1955-09-27 DOA: 02/23/2024 PCP: Arloa Elsie SAUNDERS, MD  Chief Complaint  Patient presents with   Altered Mental Status    Brief Narrative:   68 yo with hx COPD on 4 L at baseline with hx intubation/tracheostomy due to flu who presented after Glendon Fiser fall with facial trauma.  She fell about 3 days before 7/19.  Seen in ED at had rhinorocket placed and discharged home on 7/19.  She developed lethargy and returned to the ED on 7/20.    She was diagnosed with acute metabolic encephalopathy related to hypercarbia.    She required intubation for airway protection.   Significant Events 7/21 - Admit to ICU. Intubated for airway protection and respiratory support. Extubated later in the day.  Assessment & Plan:   Principal Problem:   Respiratory distress Active Problems:   AKI (acute kidney injury) (HCC)   Chronic respiratory failure (HCC)   Acute respiratory failure with hypoxia and hypercapnia (HCC)   Obesity, Class III, BMI 40-49.9 (morbid obesity)   History of DVT (deep vein thrombosis)   Benign essential HTN   OSA (obstructive sleep apnea)   Nasal fracture   Elevated LFTs   COPD (chronic obstructive pulmonary disease) (HCC)   Nasal bleeding  Acute Metabolic Encephalopathy Thought due to CO2 narcosis  Needs bipap overnight and when napping during the day Will discuss with TOC team  Acute on Chronic Hypercarbic Respiratory Failure  COPD Extubated 7/21 Uses 4 L O2 all the time Prn nebs Pulmonary hygiene Needs nightly bipap for home  11/15/2022 pulmonology note mentions if she were to develop recurrent hospitalizations with hypercapnic respiratory failure, bipap may be needed   Pulmonary Edema Noted on recent CXR Will start oral lasix  tomorrow (given recovering AKI) and repeat echo  Elevated LFT's Component of shock liver vs rhabdo Acute hepatitis panel pending  GI following at Amyri Frenz distance, 7/22  Acute Kidney  Injury Baseline creatinine <1 Peaked at 2.42 Improving  Epistaxis Rhino rocket in place, covering with abx with augmentin  Plavix  currently on hold   Anemia Trend  Hx DVT Not on anticoagulation  Obesity Body mass index is 48.59 kg/m.    DVT prophylaxis: heparin  Code Status: full Family Communication: daughter at bedside Disposition:   Status is: Inpatient Remains inpatient appropriate because: need for ongoing inpatient care, needs bipap at discharge   Consultants:  pulmonary  Procedures:  none  Antimicrobials:  Anti-infectives (From admission, onward)    Start     Dose/Rate Route Frequency Ordered Stop   02/25/24 1345  amoxicillin -clavulanate (AUGMENTIN ) 500-125 MG per tablet 1 tablet        1 tablet Oral 2 times daily 02/25/24 1256     02/24/24 0845  Ampicillin -Sulbactam (UNASYN ) 3 g in sodium chloride  0.9 % 100 mL IVPB  Status:  Discontinued        3 g 200 mL/hr over 30 Minutes Intravenous Every 8 hours 02/24/24 0752 02/25/24 1256   02/24/24 0000  ampicillin -sulbactam (UNASYN ) 1.5 g in sodium chloride  0.9 % 100 mL IVPB  Status:  Discontinued        1.5 g 200 mL/hr over 30 Minutes Intravenous Every 8 hours 02/23/24 2349 02/24/24 0752       Subjective: No new complaints today  Objective: Vitals:   02/26/24 0351 02/26/24 0757 02/26/24 1202 02/26/24 1553  BP: (!) 141/78 121/73 110/60 129/79  Pulse: 86 68 73 65  Resp: 15 15 14 17   Temp: 97.9  F (36.6 C) 98.3 F (36.8 C) 97.7 F (36.5 C) 98.3 F (36.8 C)  TempSrc: Oral Oral Oral Oral  SpO2: 95% (!) 89% 90% 99%  Weight:      Height:        Intake/Output Summary (Last 24 hours) at 02/26/2024 1616 Last data filed at 02/26/2024 0524 Gross per 24 hour  Intake 150 ml  Output 1250 ml  Net -1100 ml   Filed Weights   02/24/24 0318 02/25/24 0520 02/26/24 0333  Weight: 123.5 kg 128.3 kg 128.4 kg    Examination:  General exam: Appears calm and comfortable  Bruising to face Rhino rocket in R  nare Respiratory system: diminished due to body habitus Cardiovascular system: RRR Gastrointestinal system: Abdomen is nondistended, soft and nontender. Central nervous system: Alert and oriented. No focal neurological deficits. Extremities: no LEE   Data Reviewed: I have personally reviewed following labs and imaging studies  CBC: Recent Labs  Lab 02/22/24 1947 02/23/24 2028 02/23/24 2049 02/24/24 0243 02/24/24 0347 02/24/24 0449 02/25/24 0510 02/26/24 0453  WBC 7.8 10.4  --  9.5 9.1  --  6.8 5.9  NEUTROABS 6.3 9.1*  --   --   --   --  5.3 4.0  HGB 12.1 11.2*   < > 11.0* 10.9* 10.9* 9.7* 9.7*  HCT 38.9 35.9*   < > 34.7* 34.3* 32.0* 30.2* 30.8*  MCV 91.3 92.1  --  90.8 90.0  --  91.2 91.7  PLT 237 183  --  184 187  --  166 170   < > = values in this interval not displayed.    Basic Metabolic Panel: Recent Labs  Lab 02/23/24 2028 02/23/24 2049 02/23/24 2352 02/24/24 0346 02/24/24 0347 02/24/24 0449 02/25/24 0510 02/25/24 1527 02/26/24 0453  NA 141   < >  --   --  141 138 138 141 142  K 4.9   < >  --   --  5.1 4.6 3.9 4.1 4.0  CL 98  --   --   --  99  --  98 98 100  CO2 31  --   --   --  30  --  30 32 34*  GLUCOSE 136*  --   --   --  128*  --  132* 128* 104*  BUN 38*  --   --   --  42*  --  45* 45* 33*  CREATININE 2.34*  --   --  2.42* 2.34*  --  1.89* 1.69* 1.31*  CALCIUM  8.9  --   --   --  8.8*  --  8.7* 8.8* 8.9  MG  --   --  1.9  --  1.9  --  2.0  --  2.1  PHOS  --   --  5.4*  --  4.9*  --  2.5  --  2.8   < > = values in this interval not displayed.    GFR: Estimated Creatinine Clearance: 56.2 mL/min ([place]) (by C-G formula based on SCr of 1.31 mg/dL (H)).  Liver Function Tests: Recent Labs  Lab 02/23/24 2028 02/24/24 0347 02/25/24 0510 02/25/24 1527 02/26/24 0453  AST 1,714* 2,178* 494* 299* 159*  ALT 1,607* 1,915* 1,057* 990* 671*  ALKPHOS 63 63 54 62 63  BILITOT 0.8 1.0 1.0 0.8 1.0  PROT 6.1* 5.9* 5.3* 5.5* 5.6*  ALBUMIN 3.2* 3.3* 2.7* 2.9* 2.9*     CBG: Recent Labs  Lab 02/25/24 2342 02/26/24 0352 02/26/24  0759 02/26/24 1201 02/26/24 1553  GLUCAP 98 105* 112* 161* 95     Recent Results (from the past 240 hours)  Resp panel by RT-PCR (RSV, Flu Mumin Denomme&B, Covid) Anterior Nasal Swab     Status: None   Collection Time: 02/24/24 12:31 AM   Specimen: Anterior Nasal Swab  Result Value Ref Range Status   SARS Coronavirus 2 by RT PCR NEGATIVE NEGATIVE Final   Influenza Sydney Hasten by PCR NEGATIVE NEGATIVE Final   Influenza B by PCR NEGATIVE NEGATIVE Final    Comment: (NOTE) The Xpert Xpress SARS-CoV-2/FLU/RSV plus assay is intended as an aid in the diagnosis of influenza from Nasopharyngeal swab specimens and should not be used as Enos Muhl sole basis for treatment. Nasal washings and aspirates are unacceptable for Xpert Xpress SARS-CoV-2/FLU/RSV testing.  Fact Sheet for Patients: BloggerCourse.com  Fact Sheet for Healthcare Providers: SeriousBroker.it  This test is not yet approved or cleared by the [place]  FDA and has been authorized for detection and/or diagnosis of SARS-CoV-2 by FDA under an Emergency Use Authorization (EUA). This EUA will remain in effect (meaning this test can be used) for the duration of the COVID-19 declaration under Section 564(b)(1) of the Act, 21 U.S.C. section 360bbb-3(b)(1), unless the authorization is terminated or revoked.     Resp Syncytial Virus by PCR NEGATIVE NEGATIVE Final    Comment: (NOTE) Fact Sheet for Patients: BloggerCourse.com  Fact Sheet for Healthcare Providers: SeriousBroker.it  This test is not yet approved or cleared by the [place]  FDA and has been authorized for detection and/or diagnosis of SARS-CoV-2 by FDA under an Emergency Use Authorization (EUA). This EUA will remain in effect (meaning this test can be used) for the duration of the COVID-19 declaration under Section  564(b)(1) of the Act, 21 U.S.C. section 360bbb-3(b)(1), unless the authorization is terminated or revoked.  Performed at [facility], [address] [place], [place] [address]   MRSA Next Gen by PCR, Nasal     Status: None   Collection Time: 02/24/24 12:39 AM   Specimen: Nasal Mucosa; Nasal Swab  Result Value Ref Range Status   MRSA by PCR Next Gen NOT DETECTED NOT DETECTED Final    Comment: (NOTE) The GeneXpert MRSA Assay (FDA approved for NASAL specimens only), is one component of Naevia Unterreiner comprehensive MRSA colonization surveillance program. It is not intended to diagnose MRSA infection nor to guide or monitor treatment for MRSA infections. Test performance is not FDA approved in patients less than 28 years old. Performed at [facility], [address], [place], [place] [address]          Radiology Studies: No results found.      Scheduled Meds:  amoxicillin -clavulanate  1 tablet Oral BID   Chlorhexidine  Gluconate Cloth  6 each Topical Q0600   docusate sodium   100 mg Oral BID   folic acid   1 mg Oral Daily   heparin   5,000 Units Subcutaneous Q8H   multivitamin with minerals  1 tablet Oral Daily   senna  1 tablet Oral BID   thiamine  (VITAMIN B1) injection  100 mg Intravenous Daily   Continuous Infusions:   LOS: 3 days    Time spent: over 30 min     Meliton Monte, MD [facility]   To contact the attending provider between 7A-7P or the covering provider during after hours 7P-7A, please log into the web site [email or web address] and access using universal [place] password for that web site. If you do  not have the password, please call the hospital operator.  02/26/2024, 4:16 PM

## 2024-02-26 NOTE — Plan of Care (Signed)

## 2024-02-26 NOTE — Progress Notes (Signed)
   02/25/24 2350  BiPAP/CPAP/SIPAP  Reason BIPAP/CPAP not in use Other(comment) (Pt declined use of Cpap for the night  States she is good with only her O2 via nasal cannula)

## 2024-02-26 NOTE — Progress Notes (Signed)
   02/26/24 0943  Mobility  Activity Ambulated with assistance in hallway  Level of Assistance Standby assist, set-up cues, supervision of patient - no hands on  Assistive Device None  Distance Ambulated (ft) 200 ft  Activity Response Tolerated well  Mobility Referral Yes  Mobility visit 1 Mobility  Mobility Specialist Start Time (ACUTE ONLY) 0943  Mobility Specialist Stop Time (ACUTE ONLY) 1017  Mobility Specialist Time Calculation (min) (ACUTE ONLY) 34 min   Mobility Specialist: Progress Note  Pre-Mobility:      HR 76, SpO2 92% 4L During Mobility:             SpO2 89-97% 4L Post-Mobility:    HR 86, SpO2 93% 4L  Pt agreeable to mobility session - received in bed. Pt was asymptomatic throughout session with no complaints. Returned to chair with all needs met - call bell within reach. Daughter present. This MS did not set alarm d.t daughter being present and asking if pt can sit EOB with supervision as family visits - RN notified.   Virgle Boards, BS Mobility Specialist Please contact via SecureChat or  Rehab office at [phone].

## 2024-02-26 NOTE — Care Management (Signed)
 Notified [employer], DME provider for [facility], that patient will need Trilogy/ Bipap for home per attending. Provided [employer] with contact info for RN CM covering tomorrow, [place].

## 2024-02-27 ENCOUNTER — Inpatient Hospital Stay (HOSPITAL_COMMUNITY)

## 2024-02-27 DIAGNOSIS — R012 Other cardiac sounds: Secondary | ICD-10-CM

## 2024-02-27 DIAGNOSIS — R040 Epistaxis: Secondary | ICD-10-CM

## 2024-02-27 DIAGNOSIS — R0603 Acute respiratory distress: Secondary | ICD-10-CM | POA: Diagnosis not present

## 2024-02-27 LAB — ECHOCARDIOGRAM COMPLETE
AR max vel: 2.51 cm2
AV Area VTI: 2.13 cm2
AV Area mean vel: 2.76 cm2
AV Mean grad: 7.7 mmHg
AV Peak grad: 14.5 mmHg
Ao pk vel: 1.91 m/s
Area-P 1/2: 3.53 cm2
Calc EF: 53.7 %
Height: 64 in
S' Lateral: 2.7 cm
Single Plane A2C EF: 55 %
Single Plane A4C EF: 50.8 %
Weight: 4419.78 [oz_av]

## 2024-02-27 LAB — CBC WITH DIFFERENTIAL/PLATELET
Abs Immature Granulocytes: 0.1 K/uL — ABNORMAL HIGH (ref 0.00–0.07)
Basophils Absolute: 0.1 K/uL (ref 0.0–0.1)
Basophils Relative: 1 %
Eosinophils Absolute: 0.2 K/uL (ref 0.0–0.5)
Eosinophils Relative: 3 %
HCT: 33.5 % — ABNORMAL LOW (ref 36.0–46.0)
Hemoglobin: 10.5 g/dL — ABNORMAL LOW (ref 12.0–15.0)
Immature Granulocytes: 1 %
Lymphocytes Relative: 24 %
Lymphs Abs: 1.8 K/uL (ref 0.7–4.0)
MCH: 28.8 pg (ref 26.0–34.0)
MCHC: 31.3 g/dL (ref 30.0–36.0)
MCV: 91.8 fL (ref 80.0–100.0)
Monocytes Absolute: 0.7 K/uL (ref 0.1–1.0)
Monocytes Relative: 9 %
Neutro Abs: 4.8 K/uL (ref 1.7–7.7)
Neutrophils Relative %: 62 %
Platelets: 205 K/uL (ref 150–400)
RBC: 3.65 MIL/uL — ABNORMAL LOW (ref 3.87–5.11)
RDW: 13.6 % (ref 11.5–15.5)
WBC: 7.7 K/uL (ref 4.0–10.5)
nRBC: 0.4 % — ABNORMAL HIGH (ref 0.0–0.2)

## 2024-02-27 LAB — GLUCOSE, CAPILLARY
Glucose-Capillary: 98 mg/dL (ref 70–99)
Glucose-Capillary: 98 mg/dL (ref 70–99)
Glucose-Capillary: 134 mg/dL — ABNORMAL HIGH (ref 70–99)
Glucose-Capillary: 120 mg/dL — ABNORMAL HIGH (ref 70–99)
Glucose-Capillary: 117 mg/dL — ABNORMAL HIGH (ref 70–99)
Glucose-Capillary: 103 mg/dL — ABNORMAL HIGH (ref 70–99)

## 2024-02-27 LAB — COMPREHENSIVE METABOLIC PANEL WITH GFR
ALT: 505 U/L — ABNORMAL HIGH (ref 0–44)
AST: 69 U/L — ABNORMAL HIGH (ref 15–41)
Albumin: 3.1 g/dL — ABNORMAL LOW (ref 3.5–5.0)
Alkaline Phosphatase: 64 U/L (ref 38–126)
Anion gap: 11 (ref 5–15)
BUN: 27 mg/dL — ABNORMAL HIGH (ref 8–23)
CO2: 31 mmol/L (ref 22–32)
Calcium: 9.2 mg/dL (ref 8.9–10.3)
Chloride: 100 mmol/L (ref 98–111)
Creatinine, Ser: 1.14 mg/dL — ABNORMAL HIGH (ref 0.44–1.00)
GFR, Estimated: 53 mL/min — ABNORMAL LOW (ref >60)
Glucose, Bld: 98 mg/dL (ref 70–99)
Potassium: 4.1 mmol/L (ref 3.5–5.1)
Sodium: 142 mmol/L (ref 135–145)
Total Bilirubin: 1.1 mg/dL (ref 0.0–1.2)
Total Protein: 5.9 g/dL — ABNORMAL LOW (ref 6.5–8.1)

## 2024-02-27 LAB — MAGNESIUM: Magnesium: 1.9 mg/dL (ref 1.7–2.4)

## 2024-02-27 LAB — PHOSPHORUS: Phosphorus: 2.8 mg/dL (ref 2.5–4.6)

## 2024-02-27 MED ORDER — ENSURE PLUS HIGH PROTEIN PO LIQD
237.0000 mL | Freq: Every day | ORAL | Status: DC
  Administered 2024-02-27: 237 mL via ORAL

## 2024-02-27 NOTE — Consult Note (Signed)
 Reason for Consult:ear infecton Referring Physician: Dr Perri Madelin Ballard is an 68 y.o. female.  HPI: hx of epistaxis after trauma. No bleeding since right sided pack placed. She has no malocclusion.   Past Medical History:  Diagnosis Date   DVT (deep venous thrombosis) (HCC)    H1N1 influenza    Hypertension    Morbid obesity (HCC)    Seasonal depression (HCC)     Past Surgical History:  Procedure Laterality Date   CYST EXCISION  1980   Spinal cyst   KNEE ARTHROSCOPY     bilateral   TONSILLECTOMY  1980    Family History  Problem Relation Age of Onset   Heart disease Father    Diabetes Father    Hypertension Mother    Arthritis Mother    Hypertension Brother    Arthritis Maternal Grandmother     Social History:  reports that she has never smoked. She has never used smokeless tobacco. She reports that she does not drink alcohol and does not use drugs.  Allergies:  Allergies  Allergen Reactions   Bayer Aspirin [Aspirin] Nausea And Vomiting   Lortab [Hydrocodone-Acetaminophen ] Other (See Comments)    Unknown reaction   Sulfa Antibiotics Nausea Only    Medications: I have reviewed the patient's current medications.  Results for orders placed or performed during the hospital encounter of 02/23/24 (from the past 48 hours)  Glucose, capillary     Status: Abnormal   Collection Time: 02/25/24 11:28 AM  Result Value Ref Range   Glucose-Capillary 129 (H) 70 - 99 mg/dL    Comment: Glucose reference range applies only to samples taken after fasting for at least 8 hours.  CK     Status: Abnormal   Collection Time: 02/25/24  3:27 PM  Result Value Ref Range   Total CK 381 (H) 38 - 234 U/L    Comment: Performed at [facility], [address] [place], [place] [address]  Comprehensive metabolic panel with GFR     Status: Abnormal   Collection Time: 02/25/24  3:27 PM  Result Value Ref Range   Sodium 141 135 - 145 mmol/L   Potassium 4.1 3.5 - 5.1 mmol/L   Chloride 98  98 - 111 mmol/L   CO2 32 22 - 32 mmol/L   Glucose, Bld 128 (H) 70 - 99 mg/dL    Comment: Glucose reference range applies only to samples taken after fasting for at least 8 hours.   BUN 45 (H) 8 - 23 mg/dL   Creatinine, Ser 8.30 (H) 0.44 - 1.00 mg/dL   Calcium  8.8 (L) 8.9 - 10.3 mg/dL   Total Protein 5.5 (L) 6.5 - 8.1 g/dL   Albumin 2.9 (L) 3.5 - 5.0 g/dL   AST 700 (H) 15 - 41 U/L   ALT 990 (H) 0 - 44 U/L   Alkaline Phosphatase 62 38 - 126 U/L   Total Bilirubin 0.8 0.0 - 1.2 mg/dL   GFR, Estimated 33 (L) >60 mL/min    Comment: (NOTE) Calculated using the CKD-EPI Creatinine Equation (2021)    Anion gap 11 5 - 15    Comment: Performed at [facility], [address], [place], [place] [address]  Glucose, capillary     Status: Abnormal   Collection Time: 02/25/24  3:58 PM  Result Value Ref Range   Glucose-Capillary 117 (H) 70 - 99 mg/dL    Comment: Glucose reference range applies only to samples taken after fasting for  at least 8 hours.  Glucose, capillary     Status: Abnormal   Collection Time: 02/25/24  8:36 PM  Result Value Ref Range   Glucose-Capillary 109 (H) 70 - 99 mg/dL    Comment: Glucose reference range applies only to samples taken after fasting for at least 8 hours.  Glucose, capillary     Status: None   Collection Time: 02/25/24 11:42 PM  Result Value Ref Range   Glucose-Capillary 98 70 - 99 mg/dL    Comment: Glucose reference range applies only to samples taken after fasting for at least 8 hours.  Glucose, capillary     Status: Abnormal   Collection Time: 02/26/24  3:52 AM  Result Value Ref Range   Glucose-Capillary 105 (H) 70 - 99 mg/dL    Comment: Glucose reference range applies only to samples taken after fasting for at least 8 hours.  CBC with Differential/Platelet     Status: Abnormal   Collection Time: 02/26/24  4:53 AM  Result Value Ref Range   WBC 5.9 4.0 - 10.5 K/uL   RBC 3.36 (L) 3.87 - 5.11 MIL/uL   Hemoglobin 9.7 (L) 12.0 - 15.0 g/dL   HCT 69.1  (L) 63.9 - 46.0 %   MCV 91.7 80.0 - 100.0 fL   MCH 28.9 26.0 - 34.0 pg   MCHC 31.5 30.0 - 36.0 g/dL   RDW 86.4 88.4 - 84.4 %   Platelets 170 150 - 400 K/uL   nRBC 0.3 (H) 0.0 - 0.2 %   Neutrophils Relative % 66 %   Neutro Abs 4.0 1.7 - 7.7 K/uL   Lymphocytes Relative 21 %   Lymphs Abs 1.2 0.7 - 4.0 K/uL   Monocytes Relative 8 %   Monocytes Absolute 0.5 0.1 - 1.0 K/uL   Eosinophils Relative 2 %   Eosinophils Absolute 0.1 0.0 - 0.5 K/uL   Basophils Relative 1 %   Basophils Absolute 0.0 0.0 - 0.1 K/uL   Immature Granulocytes 2 %   Abs Immature Granulocytes 0.09 (H) 0.00 - 0.07 K/uL    Comment: Performed at [facility], [address] [place], [place] [address]  Comprehensive metabolic panel with GFR     Status: Abnormal   Collection Time: 02/26/24  4:53 AM  Result Value Ref Range   Sodium 142 135 - 145 mmol/L   Potassium 4.0 3.5 - 5.1 mmol/L   Chloride 100 98 - 111 mmol/L   CO2 34 (H) 22 - 32 mmol/L   Glucose, Bld 104 (H) 70 - 99 mg/dL    Comment: Glucose reference range applies only to samples taken after fasting for at least 8 hours.   BUN 33 (H) 8 - 23 mg/dL   Creatinine, Ser 8.68 (H) 0.44 - 1.00 mg/dL   Calcium  8.9 8.9 - 10.3 mg/dL   Total Protein 5.6 (L) 6.5 - 8.1 g/dL   Albumin 2.9 (L) 3.5 - 5.0 g/dL   AST 840 (H) 15 - 41 U/L   ALT 671 (H) 0 - 44 U/L   Alkaline Phosphatase 63 38 - 126 U/L   Total Bilirubin 1.0 0.0 - 1.2 mg/dL   GFR, Estimated 45 (L) >60 mL/min    Comment: (NOTE) Calculated using the CKD-EPI Creatinine Equation (2021)    Anion gap 8 5 - 15    Comment: Performed at [facility], [address], [place], [place] [address]  Magnesium     Status: None   Collection Time: 02/26/24  4:53  AM  Result Value Ref Range   Magnesium 2.1 1.7 - 2.4 mg/dL    Comment: Performed at [facility], [address] [place], [place] [address]  Phosphorus     Status: None   Collection Time: 02/26/24  4:53 AM  Result Value Ref Range   Phosphorus 2.8  2.5 - 4.6 mg/dL    Comment: Performed at [facility], [address], [place], [place] [address]  Brain natriuretic peptide     Status: None   Collection Time: 02/26/24  4:53 AM  Result Value Ref Range   B Natriuretic Peptide 76.0 0.0 - 100.0 pg/mL    Comment: Performed at [facility], [address], [place], [place] [address]  Hepatitis panel, acute     Status: None   Collection Time: 02/26/24  4:53 AM  Result Value Ref Range   Hepatitis B Surface Ag NON REACTIVE NON REACTIVE   HCV Ab NON REACTIVE NON REACTIVE    Comment: (NOTE) Nonreactive HCV antibody screen is consistent with no HCV infections,  unless recent infection is suspected or other evidence exists to indicate HCV infection.     Hep A IgM NON REACTIVE NON REACTIVE   Hep B C IgM NON REACTIVE NON REACTIVE    Comment: Performed at [facility], [address], [place], [place] [address]  Glucose, capillary     Status: Abnormal   Collection Time: 02/26/24  7:59 AM  Result Value Ref Range   Glucose-Capillary 112 (H) 70 - 99 mg/dL    Comment: Glucose reference range applies only to samples taken after fasting for at least 8 hours.  Glucose, capillary     Status: Abnormal   Collection Time: 02/26/24 12:01 PM  Result Value Ref Range   Glucose-Capillary 161 (H) 70 - 99 mg/dL    Comment: Glucose reference range applies only to samples taken after fasting for at least 8 hours.  Glucose, capillary     Status: None   Collection Time: 02/26/24  3:53 PM  Result Value Ref Range   Glucose-Capillary 95 70 - 99 mg/dL    Comment: Glucose reference range applies only to samples taken after fasting for at least 8 hours.  Glucose, capillary     Status: Abnormal   Collection Time: 02/26/24  8:59 PM  Result Value Ref Range   Glucose-Capillary 106 (H) 70 - 99 mg/dL    Comment: Glucose reference range applies only to samples taken after fasting for at least 8 hours.   Comment 1 Notify RN    Comment 2 Document in Chart    Glucose, capillary     Status: Abnormal   Collection Time: 02/26/24 11:25 PM  Result Value Ref Range   Glucose-Capillary 102 (H) 70 - 99 mg/dL    Comment: Glucose reference range applies only to samples taken after fasting for at least 8 hours.  CBC with Differential/Platelet     Status: Abnormal   Collection Time: 02/27/24  4:33 AM  Result Value Ref Range   WBC 7.7 4.0 - 10.5 K/uL   RBC 3.65 (L) 3.87 - 5.11 MIL/uL   Hemoglobin 10.5 (L) 12.0 - 15.0 g/dL   HCT 66.4 (L) 63.9 - 53.9 %   MCV 91.8 80.0 - 100.0 fL   MCH 28.8 26.0 - 34.0 pg   MCHC 31.3 30.0 - 36.0 g/dL   RDW 86.3 88.4 - 84.4 %   Platelets 205 150 - 400 K/uL   nRBC  0.4 (H) 0.0 - 0.2 %   Neutrophils Relative % 62 %   Neutro Abs 4.8 1.7 - 7.7 K/uL   Lymphocytes Relative 24 %   Lymphs Abs 1.8 0.7 - 4.0 K/uL   Monocytes Relative 9 %   Monocytes Absolute 0.7 0.1 - 1.0 K/uL   Eosinophils Relative 3 %   Eosinophils Absolute 0.2 0.0 - 0.5 K/uL   Basophils Relative 1 %   Basophils Absolute 0.1 0.0 - 0.1 K/uL   Immature Granulocytes 1 %   Abs Immature Granulocytes 0.10 (H) 0.00 - 0.07 K/uL    Comment: Performed at [facility], [address] [place], [place] [address]  Comprehensive metabolic panel with GFR     Status: Abnormal   Collection Time: 02/27/24  4:33 AM  Result Value Ref Range   Sodium 142 135 - 145 mmol/L   Potassium 4.1 3.5 - 5.1 mmol/L   Chloride 100 98 - 111 mmol/L   CO2 31 22 - 32 mmol/L   Glucose, Bld 98 70 - 99 mg/dL    Comment: Glucose reference range applies only to samples taken after fasting for at least 8 hours.   BUN 27 (H) 8 - 23 mg/dL   Creatinine, Ser 8.85 (H) 0.44 - 1.00 mg/dL   Calcium  9.2 8.9 - 10.3 mg/dL   Total Protein 5.9 (L) 6.5 - 8.1 g/dL   Albumin 3.1 (L) 3.5 - 5.0 g/dL   AST 69 (H) 15 - 41 U/L   ALT 505 (H) 0 - 44 U/L   Alkaline Phosphatase 64 38 - 126 U/L   Total Bilirubin 1.1 0.0 - 1.2 mg/dL   GFR, Estimated 53 (L) >60 mL/min    Comment: (NOTE) Calculated using the  CKD-EPI Creatinine Equation (2021)    Anion gap 11 5 - 15    Comment: Performed at [facility], [address] [place], [place] [address]  Magnesium     Status: None   Collection Time: 02/27/24  4:33 AM  Result Value Ref Range   Magnesium 1.9 1.7 - 2.4 mg/dL    Comment: Performed at [facility], [address] [place], [place] [address]  Phosphorus     Status: None   Collection Time: 02/27/24  4:33 AM  Result Value Ref Range   Phosphorus 2.8 2.5 - 4.6 mg/dL    Comment: Performed at [facility], [address], [place], [place] [address]  Glucose, capillary     Status: None   Collection Time: 02/27/24  4:33 AM  Result Value Ref Range   Glucose-Capillary 98 70 - 99 mg/dL    Comment: Glucose reference range applies only to samples taken after fasting for at least 8 hours.   Comment 1 Notify RN    Comment 2 Document in Chart   Glucose, capillary     Status: None   Collection Time: 02/27/24  8:07 AM  Result Value Ref Range   Glucose-Capillary 98 70 - 99 mg/dL    Comment: Glucose reference range applies only to samples taken after fasting for at least 8 hours.    No results found.  ROS Blood pressure 138/78, pulse 77, temperature 98.5 F (36.9 C), temperature source Oral, resp. rate 14, height 5' 4 (1.626 m), weight 125.3 kg, SpO2 98%. Physical Exam Constitutional:      Appearance: Normal appearance.  HENT:     Head: Normocephalic.     Nose:     Comments: Pack was  rhinorocket and air released. Removed with problem. No lesions. No bleeding    Mouth/Throat:     Mouth: Mucous membranes are moist.  Eyes:     Pupils: Pupils are equal, round, and reactive to light.  Musculoskeletal:     Cervical back: Normal range of motion.  Neurological:     Mental Status: She is alert.       Assessment/Plan: Epistaxis- the right pack which was rhinorocket was removed without difficulty. No bleeding. Good airway. Call if any further bleeding  [name], 8:16 AM

## 2024-02-27 NOTE — Progress Notes (Signed)
 Received call from Merilee Roughen with [employer]; NIV has been approved. Their RT will meet the patient at their home tomorrow morning for the home assessment and provide the NIV / teaching/ instructions of usage. KATHEE Herring [facility] Inpatient Care Management Supervisor [facility] Interim [phone]

## 2024-02-27 NOTE — Progress Notes (Addendum)
 Nutrition Follow-up  DOCUMENTATION CODES:   Morbid obesity  INTERVENTION:  Add Ensure Plus High Protein po daily, each supplement provides 350 kcal and 20 grams of protein  Continue folic acid /thiamine / MVI w/ minerals Discussed importance of protein/calorie intake to preserve lean body mass as well as prevent skin breakdown  NUTRITION DIAGNOSIS:  Inadequate oral intake related to acute illness as evidenced by NPO status.  GOAL:  Patient will meet greater than or equal to 90% of their needs  MONITOR:  Vent status, Labs, Weight trends, TF tolerance  REASON FOR ASSESSMENT:   Consult, Ventilator Assessment of nutrition requirement/status  ASSESSMENT:  Pt admitted with AMS with recent fall. PMH significant for HTN, COPD, DVT, H1N1 flu.  7/21: admitted; intubated and subsequently extubated same day 7/22: txr out of ICU 7/24: echocardiogram  She is stabilizing medically. Working to obtain Trilogy vent for home use prior to discharge. Must wear during the night and while sleeping.  Average Meal Intake No documented meal intake to review  Spoke with patient and daughter at bedside. Patient sitting up at sink at time of visit. Daughter and patient report poor intake for some time (almost a year) mainly related to patient reported inability to taste. Discussed common reasons for this and reiterated importance of intake while admitted to prevent loss of lean body mass or skin breakdown. Also discussed her inability to lose weight due to consuming too few calories at baseline. She is amicable to receiving Ensure once daily to augment intake. Daughter also bringing in food from outside hospital to aid in stimulating appetite.   Admit Weight: 123.5 kg Current Weight: 125.3 kg + non-pitting, generalized edema  No significant weight loss noted, per chart review, however limited history to review as well. Patient and daughter endorse no significant weight changes over last year. No  significant edema on exam.    Medications: colace BID, folvite , MVI, furosemide , senna BID, thiamine   Creatinine, liver enzymes, and GFR all improving. Supplementation remains in place.    Labs:  BUN 42 Cr 1.14 Phosphorus 2.8 AST 69 ALT 505 GFR 53 CBG's 98-104 x12 hours  Diet Order:   Diet Order             Diet regular Room service appropriate? Yes; Fluid consistency: Thin  Diet effective now             EDUCATION NEEDS:  No education needs have been identified at this time  Skin:  Skin Assessment: Reviewed RN Assessment  Last BM:  7/23  Height:  Ht Readings from Last 1 Encounters:  02/24/24 5' 4 (1.626 m)   Weight:  Wt Readings from Last 1 Encounters:  02/27/24 125.3 kg    Ideal Body Weight:  54.5 kg  BMI:  Body mass index is 47.42 kg/m.  Estimated Nutritional Needs:   Kcal:  1500-1700  Protein:  85-100g  Fluid:  >/=1.5L  Blair Deaner MS, RD, LDN Registered Dietitian Clinical Nutrition RD Inpatient Contact Info in Amion

## 2024-02-27 NOTE — Progress Notes (Signed)
   02/27/24 0926  Mobility  Activity Ambulated independently in hallway  Level of Assistance Standby assist, set-up cues, supervision of patient - no hands on  Assistive Device None  Distance Ambulated (ft) 500 ft  Activity Response Tolerated well  Mobility Referral Yes  Mobility visit 1 Mobility  Mobility Specialist Start Time (ACUTE ONLY) 0926  Mobility Specialist Stop Time (ACUTE ONLY) 0943  Mobility Specialist Time Calculation (min) (ACUTE ONLY) 17 min   Mobility Specialist: Progress Note  Pre-Mobility:      HR  85, SpO2 98% RA Post-Mobility:    HR  96, SpO2 97% RA  Pt agreeable to mobility session - received in sitting upright in bed. Pt was asymptomatic throughout session with no complaints. Returned to EOB with all needs met - call bell within reach. Daughter present.   Virgle Boards, BS Mobility Specialist Please contact via SecureChat or  Rehab office at [phone].

## 2024-02-27 NOTE — Plan of Care (Signed)

## 2024-02-27 NOTE — Plan of Care (Signed)
  Problem: Education: Goal: Knowledge of General Education information will improve Description: Including pain rating scale, medication(s)/side effects and non-pharmacologic comfort measures Outcome: Progressing   Problem: Clinical Measurements: Goal: Will remain free from infection Outcome: Progressing Goal: Diagnostic test results will improve Outcome: Progressing Goal: Respiratory complications will improve Outcome: Progressing   Problem: Activity: Goal: Risk for activity intolerance will decrease Outcome: Progressing   Problem: Coping: Goal: Level of anxiety will decrease Outcome: Progressing   

## 2024-02-27 NOTE — Progress Notes (Signed)
 PROGRESS NOTE    [name]  FMW:[id number] DOB: 22-Oct-1955 DOA: 02/23/2024 PCP: Arloa Elsie SAUNDERS, MD  Chief Complaint  Patient presents with   Altered Mental Status    Brief Narrative:   68 yo with hx COPD on 4 L at baseline with hx intubation/tracheostomy due to flu who presented after Keywon Mestre fall with facial trauma.  She fell about 3 days before 7/19.  Seen in ED at had rhinorocket placed and discharged home on 7/19.  She developed lethargy and returned to the ED on 7/20.    She was diagnosed with acute metabolic encephalopathy related to hypercarbia.    She required intubation for airway protection.   Significant Events 7/21 - Admit to ICU. Intubated for airway protection and respiratory support. Extubated later in the day.  Assessment & Plan:   Principal Problem:   Respiratory distress Active Problems:   AKI (acute kidney injury) (HCC)   Chronic respiratory failure (HCC)   Acute respiratory failure with hypoxia and hypercapnia (HCC)   Obesity, Class III, BMI 40-49.9 (morbid obesity)   History of DVT (deep vein thrombosis)   Benign essential HTN   OSA (obstructive sleep apnea)   Nasal fracture   Elevated LFTs   COPD (chronic obstructive pulmonary disease) (HCC)   Nasal bleeding  Acute Metabolic Encephalopathy Thought due to CO2 narcosis  Needs bipap overnight and when napping during the day Will discuss with TOC team  Acute on Chronic Hypercarbic Respiratory Failure  COPD Extubated 7/21 Multiple blood gases show hypercarbic respiratory failure, respiratory acidosis  Uses 4 L O2 all the time Prn nebs Pulmonary hygiene Needs nightly bipap for home - will work on trilogy vent at discharge.  11/15/2022 pulmonology note mentions if she were to develop recurrent hospitalizations with hypercapnic respiratory failure, bipap may be needed   Pulmonary Edema Noted on recent CXR Will start oral lasix  tomorrow (given recovering AKI) and repeat echo - pending today  Elevated  LFT's Component of shock liver vs rhabdo Acute hepatitis panel pending  GI following at [facility], 7/22  Acute Kidney Injury Baseline creatinine <1 Peaked at 2.42 Improving  Epistaxis Rhino rocket in place Rhinorocket removed by ENT Plavix  currently on hold - not sure indication for plavix , will look into this  Anemia Trend  Hx DVT Not on anticoagulation  Obesity Body mass index is 47.42 kg/m.    DVT prophylaxis: heparin  Code Status: full Family Communication: [name] at bedside Disposition:   Status is: Inpatient Remains inpatient appropriate because: need for ongoing inpatient care, needs bipap at discharge   Consultants:  pulmonary  Procedures:  none  Antimicrobials:  Anti-infectives (From admission, onward)    Start     Dose/Rate Route Frequency Ordered Stop   02/25/24 1345  amoxicillin -clavulanate (AUGMENTIN ) 500-125 MG per tablet 1 tablet        1 tablet Oral 2 times daily 02/25/24 1256     02/24/24 0845  Ampicillin -Sulbactam (UNASYN ) 3 g in sodium chloride  0.9 % 100 mL IVPB  Status:  Discontinued        3 g 200 mL/hr over 30 Minutes Intravenous Every 8 hours 02/24/24 0752 02/25/24 1256   02/24/24 0000  ampicillin -sulbactam (UNASYN ) 1.5 g in sodium chloride  0.9 % 100 mL IVPB  Status:  Discontinued        1.5 g 200 mL/hr over 30 Minutes Intravenous Every 8 hours 02/23/24 2349 02/24/24 0752       Subjective: No new complaints [name] at bedside  Objective: Vitals:  02/27/24 0436 02/27/24 0500 02/27/24 0531 02/27/24 0800  BP: (!) 171/104  (!) 153/82 138/78  Pulse:   71 77  Resp: 14  14 14   Temp: 98 F (36.7 C)   98.5 F (36.9 C)  TempSrc: Oral   Oral  SpO2: 99%   98%  Weight:  125.3 kg    Height:        Intake/Output Summary (Last 24 hours) at 02/27/2024 1250 Last data filed at 02/26/2024 1730 Gross per 24 hour  Intake --  Output 750 ml  Net -750 ml   Filed Weights   02/25/24 0520 02/26/24 0333 02/27/24 0500  Weight: 128.3 kg  128.4 kg 125.3 kg    Examination:  General: No acute distress. Periorbital bruising Cardiovascular: Heart sounds show Jariah Tarkowski regular rate, and rhythm. Lungs: Clear to auscultation bilaterally  Abdomen: Soft, nontender, nondistended Neurological: Alert and oriented 3. Moves all extremities 4 with equal strength. Cranial nerves II through XII grossly intact. Extremities: No clubbing or cyanosis. No edema.    Data Reviewed: I have personally reviewed following labs and imaging studies  CBC: Recent Labs  Lab 02/22/24 1947 02/23/24 2028 02/23/24 2049 02/24/24 0243 02/24/24 0347 02/24/24 0449 02/25/24 0510 02/26/24 0453 02/27/24 0433  WBC 7.8 10.4  --  9.5 9.1  --  6.8 5.9 7.7  NEUTROABS 6.3 9.1*  --   --   --   --  5.3 4.0 4.8  HGB 12.1 11.2*   < > 11.0* 10.9* 10.9* 9.7* 9.7* 10.5*  HCT 38.9 35.9*   < > 34.7* 34.3* 32.0* 30.2* 30.8* 33.5*  MCV 91.3 92.1  --  90.8 90.0  --  91.2 91.7 91.8  PLT 237 183  --  184 187  --  166 170 205   < > = values in this interval not displayed.    Basic Metabolic Panel: Recent Labs  Lab 02/23/24 2352 02/24/24 0346 02/24/24 0347 02/24/24 0449 02/25/24 0510 02/25/24 1527 02/26/24 0453 02/27/24 0433  NA  --   --  141 138 138 141 142 142  K  --   --  5.1 4.6 3.9 4.1 4.0 4.1  CL  --   --  99  --  98 98 100 100  CO2  --   --  30  --  30 32 34* 31  GLUCOSE  --   --  128*  --  132* 128* 104* 98  BUN  --   --  42*  --  45* 45* 33* 27*  CREATININE  --    < > 2.34*  --  1.89* 1.69* 1.31* 1.14*  CALCIUM   --   --  8.8*  --  8.7* 8.8* 8.9 9.2  MG 1.9  --  1.9  --  2.0  --  2.1 1.9  PHOS 5.4*  --  4.9*  --  2.5  --  2.8 2.8   < > = values in this interval not displayed.    GFR: Estimated Creatinine Clearance: 63.5 mL/min (Aeryn Medici) (by C-G formula based on SCr of 1.14 mg/dL (H)).  Liver Function Tests: Recent Labs  Lab 02/24/24 0347 02/25/24 0510 02/25/24 1527 02/26/24 0453 02/27/24 0433  AST 2,178* 494* 299* 159* 69*  ALT 1,915* 1,057* 990*  671* 505*  ALKPHOS 63 54 62 63 64  BILITOT 1.0 1.0 0.8 1.0 1.1  PROT 5.9* 5.3* 5.5* 5.6* 5.9*  ALBUMIN 3.3* 2.7* 2.9* 2.9* 3.1*    CBG: Recent Labs  Lab 02/26/24 1553  02/26/24 2059 02/26/24 2325 02/27/24 0433 02/27/24 0807  GLUCAP 95 106* 102* 98 98     Recent Results (from the past 240 hours)  Resp panel by RT-PCR (RSV, Flu Khiem Gargis&B, Covid) Anterior Nasal Swab     Status: None   Collection Time: 02/24/24 12:31 AM   Specimen: Anterior Nasal Swab  Result Value Ref Range Status   SARS Coronavirus 2 by RT PCR NEGATIVE NEGATIVE Final   Influenza Armelia Penton by PCR NEGATIVE NEGATIVE Final   Influenza B by PCR NEGATIVE NEGATIVE Final    Comment: (NOTE) The Xpert Xpress SARS-CoV-2/FLU/RSV plus assay is intended as an aid in the diagnosis of influenza from Nasopharyngeal swab specimens and should not be used as Robin Pafford sole basis for treatment. Nasal washings and aspirates are unacceptable for Xpert Xpress SARS-CoV-2/FLU/RSV testing.  Fact Sheet for Patients: BloggerCourse.com  Fact Sheet for Healthcare Providers: SeriousBroker.it  This test is not yet approved or cleared by the [place]  FDA and has been authorized for detection and/or diagnosis of SARS-CoV-2 by FDA under an Emergency Use Authorization (EUA). This EUA will remain in effect (meaning this test can be used) for the duration of the COVID-19 declaration under Section 564(b)(1) of the Act, 21 U.S.C. section 360bbb-3(b)(1), unless the authorization is terminated or revoked.     Resp Syncytial Virus by PCR NEGATIVE NEGATIVE Final    Comment: (NOTE) Fact Sheet for Patients: BloggerCourse.com  Fact Sheet for Healthcare Providers: SeriousBroker.it  This test is not yet approved or cleared by the [place]  FDA and has been authorized for detection and/or diagnosis of SARS-CoV-2 by FDA under an Emergency Use Authorization  (EUA). This EUA will remain in effect (meaning this test can be used) for the duration of the COVID-19 declaration under Section 564(b)(1) of the Act, 21 U.S.C. section 360bbb-3(b)(1), unless the authorization is terminated or revoked.  Performed at [facility], [address] [place], [place] [address]   MRSA Next Gen by PCR, Nasal     Status: None   Collection Time: 02/24/24 12:39 AM   Specimen: Nasal Mucosa; Nasal Swab  Result Value Ref Range Status   MRSA by PCR Next Gen NOT DETECTED NOT DETECTED Final    Comment: (NOTE) The GeneXpert MRSA Assay (FDA approved for NASAL specimens only), is one component of Deepti Gunawan comprehensive MRSA colonization surveillance program. It is not intended to diagnose MRSA infection nor to guide or monitor treatment for MRSA infections. Test performance is not FDA approved in patients less than 19 years old. Performed at [facility], [address] [place], [place] [address]          Radiology Studies: No results found.      Scheduled Meds:  amoxicillin -clavulanate  1 tablet Oral BID   Chlorhexidine  Gluconate Cloth  6 each Topical Q0600   docusate sodium   100 mg Oral BID   folic acid   1 mg Oral Daily   furosemide   40 mg Oral Daily   heparin   5,000 Units Subcutaneous Q8H   multivitamin with minerals  1 tablet Oral Daily   senna  1 tablet Oral BID   thiamine  (VITAMIN B1) injection  100 mg Intravenous Daily   Continuous Infusions:   LOS: 4 days    Time spent: over 30 min     Meliton Monte, MD [facility]   To contact the attending provider between 7A-7P or the covering provider during after hours 7P-7A, please log into the web site [email or web address] and access using  universal [place] password for that web site. If you do not have the password, please call the hospital operator.  02/27/2024, 12:50 PM

## 2024-02-27 NOTE — Progress Notes (Signed)
 Medical necessity letter for NIV  Without the use of the ventilator the patient's condition will quickly deteriorate. Removal of the ventilator may cause serious harm to the patient, exacerbation of condition and hospital readmission. Bilevel/RAD has been tried and failed to maintain or stabilize the patient. Bilevel does not meet current volume requirements. Patient requires frequent durations of ventilatory support. Intermittent usage is insufficient.

## 2024-02-27 NOTE — Progress Notes (Signed)
*  PRELIMINARY RESULTS* Echocardiogram 2D Echocardiogram has been performed.  [name] Stallion 02/27/2024, 10:39 AM

## 2024-02-27 NOTE — Progress Notes (Signed)
 Physical Therapy Treatment Patient Details Name: [name] MRN: [id number] DOB: 1956/04/04 Today's Date: 02/27/2024   History of Present Illness Pt is a 68 y.o. F presenting to [facility] on 02/23/24 following altered mental status. Pt was recently seen following a fall at home 1 week ago. On 02/24/24 pt was intubated and extubated. PMH is significant for  HTN, COPD on 4L at home, and flu resulting in intubation and tracheostomy.    PT Comments  Pt seen for PT tx with pt agreeable, daughter present in room. Pt pleasant, eager to participate. Pt is able to complete bed mobility & transfers with mod I. Pt ambulates in hallway without AD with supervision with 1 LOB to R with CGA to correct. Pt is able to negotiate stairs to simulate home environment with supervision. Will continue to follow pt acutely to address balance & endurance.    If plan is discharge home, recommend the following: A little help with walking and/or transfers;A little help with bathing/dressing/bathroom;Assist for transportation;Help with stairs or ramp for entrance;Assistance with cooking/housework   Can travel by private vehicle        Equipment Recommendations  None recommended by PT    Recommendations for Other Services       Precautions / Restrictions Precautions Precautions: Fall Recall of Precautions/Restrictions: Intact Restrictions Weight Bearing Restrictions Per Provider Order: No     Mobility  Bed Mobility Overal bed mobility: Modified Independent Bed Mobility: Sidelying to Sit   Sidelying to sit: Modified independent (Device/Increase time)            Transfers Overall transfer level: Modified independent Equipment used: None Transfers: Sit to/from Stand Sit to Stand: Modified independent (Device/Increase time)           General transfer comment: sit>stand from EOB without AD    Ambulation/Gait Ambulation/Gait assistance: Supervision, Contact guard assist Gait Distance (Feet): 150  Feet Assistive device: None Gait Pattern/deviations: Decreased step length - right, Decreased stride length, Decreased step length - left, Step-through pattern Gait velocity: decreased     General Gait Details: 1 mild LOB to R with CGA to correct   Stairs Stairs: Yes Stairs assistance: Supervision Stair Management: One rail Right, Two rails, Step to pattern Number of Stairs: 11 (6) General stair comments: Ascends 4 steps with R rail then remainder of steps with B rails, supervision, step-to pattern, no LOB.   Wheelchair Mobility     Tilt Bed    Modified Rankin (Stroke Patients Only)       Balance Overall balance assessment: Needs assistance Sitting-balance support: No upper extremity supported, Feet supported Sitting balance-Leahy Scale: Good Sitting balance - Comments: dons shoes sitting EOB, threads pants over BLE sitting EOB   Standing balance support: No upper extremity supported, During functional activity Standing balance-Leahy Scale: Good                              Communication Communication Communication: No apparent difficulties  Cognition Arousal: Alert Behavior During Therapy: WFL for tasks assessed/performed   PT - Cognitive impairments: No apparent impairments                         Following commands: Intact      Cueing Cueing Techniques: Verbal cues, Visual cues  Exercises      General Comments General comments (skin integrity, edema, etc.): pt on 4L/min via nasal cannula, SpO2 97% after stair negotiation, HR  107 bpm.      Pertinent Vitals/Pain Pain Assessment Pain Assessment: No/denies pain    Home Living                          Prior Function            PT Goals (current goals can now be found in the care plan section) Acute Rehab PT Goals Patient Stated Goal: to go home PT Goal Formulation: With patient Time For Goal Achievement: 03/10/24 Potential to Achieve Goals: Good Progress towards  PT goals: Progressing toward goals    Frequency    Min 3X/week      PT Plan      Co-evaluation              AM-PAC PT 6 Clicks Mobility   Outcome Measure  Help needed turning from your back to your side while in a flat bed without using bedrails?: None Help needed moving from lying on your back to sitting on the side of a flat bed without using bedrails?: None Help needed moving to and from a bed to a chair (including a wheelchair)?: A Little Help needed standing up from a chair using your arms (e.g., wheelchair or bedside chair)?: None Help needed to walk in hospital room?: A Little Help needed climbing 3-5 steps with a railing? : A Little 6 Click Score: 21    End of Session Equipment Utilized During Treatment: Oxygen Activity Tolerance: Patient tolerated treatment well Patient left: in bed;with call bell/phone within reach;with family/visitor present Nurse Communication: Mobility status PT Visit Diagnosis: Unsteadiness on feet (R26.81);Muscle weakness (generalized) (M62.81);History of falling (Z91.81)     Time: 8578-8566 PT Time Calculation (min) (ACUTE ONLY): 12 min  Charges:    $Therapeutic Activity: 8-22 mins PT General Charges $$ ACUTE PT VISIT: 1 Visit                     [name], PT, DPT 02/27/24, 2:44 PM    [name] 02/27/2024, 2:44 PM

## 2024-02-27 NOTE — Progress Notes (Incomplete)
 Patient is started on Bipap

## 2024-02-28 ENCOUNTER — Other Ambulatory Visit (HOSPITAL_COMMUNITY): Payer: Self-pay

## 2024-02-28 DIAGNOSIS — R0603 Acute respiratory distress: Secondary | ICD-10-CM | POA: Diagnosis not present

## 2024-02-28 LAB — COMPREHENSIVE METABOLIC PANEL WITH GFR
ALT: 271 U/L — ABNORMAL HIGH (ref 0–44)
AST: 33 U/L (ref 15–41)
Albumin: 2.9 g/dL — ABNORMAL LOW (ref 3.5–5.0)
Alkaline Phosphatase: 60 U/L (ref 38–126)
Anion gap: 10 (ref 5–15)
BUN: 22 mg/dL (ref 8–23)
CO2: 31 mmol/L (ref 22–32)
Calcium: 8.9 mg/dL (ref 8.9–10.3)
Chloride: 100 mmol/L (ref 98–111)
Creatinine, Ser: 1 mg/dL (ref 0.44–1.00)
GFR, Estimated: >60 mL/min (ref >60)
Glucose, Bld: 144 mg/dL — ABNORMAL HIGH (ref 70–99)
Potassium: 3.3 mmol/L — ABNORMAL LOW (ref 3.5–5.1)
Sodium: 141 mmol/L (ref 135–145)
Total Bilirubin: 0.8 mg/dL (ref 0.0–1.2)
Total Protein: 5.5 g/dL — ABNORMAL LOW (ref 6.5–8.1)

## 2024-02-28 LAB — CBC WITH DIFFERENTIAL/PLATELET
Abs Immature Granulocytes: 0.1 K/uL — ABNORMAL HIGH (ref 0.00–0.07)
Basophils Absolute: 0 K/uL (ref 0.0–0.1)
Basophils Relative: 1 %
Eosinophils Absolute: 0.2 K/uL (ref 0.0–0.5)
Eosinophils Relative: 4 %
HCT: 30.1 % — ABNORMAL LOW (ref 36.0–46.0)
Hemoglobin: 9.8 g/dL — ABNORMAL LOW (ref 12.0–15.0)
Immature Granulocytes: 2 %
Lymphocytes Relative: 22 %
Lymphs Abs: 1.4 K/uL (ref 0.7–4.0)
MCH: 29.3 pg (ref 26.0–34.0)
MCHC: 32.6 g/dL (ref 30.0–36.0)
MCV: 90.1 fL (ref 80.0–100.0)
Monocytes Absolute: 0.6 K/uL (ref 0.1–1.0)
Monocytes Relative: 9 %
Neutro Abs: 3.8 K/uL (ref 1.7–7.7)
Neutrophils Relative %: 62 %
Platelets: 188 K/uL (ref 150–400)
RBC: 3.34 MIL/uL — ABNORMAL LOW (ref 3.87–5.11)
RDW: 13.7 % (ref 11.5–15.5)
WBC: 6.2 K/uL (ref 4.0–10.5)
nRBC: 0 % (ref 0.0–0.2)

## 2024-02-28 LAB — GLUCOSE, CAPILLARY
Glucose-Capillary: 120 mg/dL — ABNORMAL HIGH (ref 70–99)
Glucose-Capillary: 95 mg/dL (ref 70–99)

## 2024-02-28 LAB — MAGNESIUM: Magnesium: 1.8 mg/dL (ref 1.7–2.4)

## 2024-02-28 LAB — PHOSPHORUS: Phosphorus: 3.6 mg/dL (ref 2.5–4.6)

## 2024-02-28 MED ORDER — IBUPROFEN 400 MG PO TABS
400.0000 mg | ORAL_TABLET | Freq: Once | ORAL | Status: AC | PRN
  Administered 2024-02-28: 400 mg via ORAL
  Filled 2024-02-28: qty 1

## 2024-02-28 MED ORDER — FUROSEMIDE 40 MG PO TABS
40.0000 mg | ORAL_TABLET | Freq: Every day | ORAL | 0 refills | Status: AC
  Filled 2024-02-28: qty 30, 30d supply, fill #0

## 2024-02-28 MED ORDER — POTASSIUM CHLORIDE CRYS ER 20 MEQ PO TBCR
40.0000 meq | EXTENDED_RELEASE_TABLET | ORAL | Status: DC
  Administered 2024-02-28: 40 meq via ORAL
  Filled 2024-02-28 (×2): qty 2

## 2024-02-28 MED ORDER — POTASSIUM CHLORIDE CRYS ER 20 MEQ PO TBCR
20.0000 meq | EXTENDED_RELEASE_TABLET | Freq: Every day | ORAL | 0 refills | Status: AC
  Filled 2024-02-28: qty 30, 30d supply, fill #0

## 2024-02-28 NOTE — Progress Notes (Signed)
 Occupational Therapy Treatment and Discharge Patient Details Name: [name] MRN: [id number] DOB: [birth year] Today's Date: 02/28/2024   History of present illness Pt is a 68 y.o. F presenting to [facility] on 02/23/24 following altered mental status. Pt was recently seen following a fall at home 1 week ago. On 02/24/24 pt was intubated and extubated. PMH is significant for  HTN, COPD on 4L at home, and flu resulting in intubation and tracheostomy.   OT comments  Pt has returned to modified independence in self care and mobility. Reinforced energy conservation, sitting to shower as necessary and use of O2 in shower. Pt verbalized understanding. Pt is eager to discharge home today.       If plan is discharge home, recommend the following:  Assist for transportation   Equipment Recommendations  None recommended by OT    Recommendations for Other Services      Precautions / Restrictions Precautions Precautions: None Recall of Precautions/Restrictions: Intact Restrictions Weight Bearing Restrictions Per Provider Order: No       Mobility Bed Mobility Overal bed mobility: Modified Independent                  Transfers Overall transfer level: Independent Equipment used: None                     Balance     Sitting balance-Leahy Scale: Good       Standing balance-Leahy Scale: Good                             ADL either performed or assessed with clinical judgement   ADL Overall ADL's : Modified independent                                            Extremity/Trunk Assessment              Vision       Perception     Praxis     Communication Communication Communication: No apparent difficulties   Cognition Arousal: Alert Behavior During Therapy: WFL for tasks assessed/performed Cognition: No apparent impairments                               Following commands: Intact        Cueing   Cueing  Techniques: Verbal cues  Exercises      Shoulder Instructions       General Comments      Pertinent Vitals/ Pain       Pain Assessment Pain Assessment: No/denies pain  Home Living                                          Prior Functioning/Environment              Frequency           Progress Toward Goals  OT Goals(current goals can now be found in the care plan section)  Progress towards OT goals: Goals met/education completed, patient discharged from OT     Plan      Co-evaluation  AM-PAC OT 6 Clicks Daily Activity     Outcome Measure   Help from another person eating meals?: None Help from another person taking care of personal grooming?: None Help from another person toileting, which includes using toliet, bedpan, or urinal?: None Help from another person bathing (including washing, rinsing, drying)?: None Help from another person to put on and taking off regular upper body clothing?: None Help from another person to put on and taking off regular lower body clothing?: None 6 Click Score: 24    End of Session Equipment Utilized During Treatment: Oxygen  OT Visit Diagnosis: Other (comment) (decreased activity tolerance)   Activity Tolerance Patient tolerated treatment well   Patient Left in bed;with call bell/phone within reach;with family/visitor present   Nurse Communication          Time: 9085-9075 OT Time Calculation (min): 10 min  Charges: OT General Charges $OT Visit: 1 Visit OT Treatments $Self Care/Home Management : 8-22 mins  Mliss HERO, OTR/L Acute Rehabilitation Services Office: [phone]   Kennth Mliss Helling 02/28/2024, 9:25 AM

## 2024-02-28 NOTE — Discharge Summary (Signed)
 Physician Discharge Summary  [name] FMW:[id number] DOB: 1955/09/10 DOA: 02/23/2024  PCP: Maria Elsie SAUNDERS, MD  Admit date: 02/23/2024 Discharge date: 02/28/2024  Time spent: 40 minutes  Recommendations for Outpatient Follow-up:  Follow outpatient CBC/CMP  Follow with pulmonary outpatient - started on bipap for home Started on lasix , follow volume status/lytes/renal function Follow LFTs outpatient  Resume plavix  in 1 week - discuss with cards whether antiplatelet indicated long term? (Started in setting of elevated troponin, low risk stress test 2024)  Discharge Diagnoses:  Principal Problem:   Respiratory distress Active Problems:   AKI (acute kidney injury) (HCC)   Chronic respiratory failure (HCC)   Acute respiratory failure with hypoxia and hypercapnia (HCC)   Obesity, Class III, BMI 40-49.9 (morbid obesity)   History of DVT (deep vein thrombosis)   Benign essential HTN   OSA (obstructive sleep apnea)   Nasal fracture   Elevated LFTs   COPD (chronic obstructive pulmonary disease) (HCC)   Nasal bleeding   Discharge Condition: stable  Diet recommendation: heart healthy  Filed Weights   02/27/24 0500 02/27/24 2322 02/28/24 0500  Weight: 125.3 kg 125.9 kg 125.8 kg    History of present illness:   68 yo with hx COPD on 4 L at baseline with hx intubation/tracheostomy due to flu who presented after [name] fall with facial trauma.  She fell about 3 days before 7/19.  Seen in ED at had rhinorocket placed and discharged home on 7/19.  She developed lethargy and returned to the ED on 7/20.     She was diagnosed with acute metabolic encephalopathy related to hypercarbia.     She required intubation for airway protection.    Significant Events 7/21 - Admit to ICU. Intubated for airway protection and respiratory support. Extubated later in the day.  She's improved with supportive care.  Given history of admissions related to hypercarbia, arranging bipap (trilogy vent) for home.    Stable for discharge on 7/25.  Hospital Course:  Assessment and Plan:  Acute Metabolic Encephalopathy Thought due to CO2 narcosis  Needs bipap overnight and when napping during the day Will discuss with TOC team   Acute on Chronic Hypercarbic Respiratory Failure  COPD Extubated 7/21 Multiple blood gases show hypercarbic respiratory failure, respiratory acidosis  Uses 4 L O2 all the time Prn nebs Pulmonary hygiene Needs nightly bipap for home - discharging on trilogy vent 11/15/2022 pulmonology note mentions if she were to develop recurrent hospitalizations with hypercapnic respiratory failure, bipap may be needed -> will need outpatient pulmonary follow up   Pulmonary Edema  HFpEF Noted on recent CXR Echo with EF 60-65%, no rwma Starting on lasix  at discharge, follow outpatient    Elevated LFT's Component of shock liver vs rhabdo Acute hepatitis panel negative  GI following at [name] distance, 7/22 Hold statin for now   Acute Kidney Injury Baseline creatinine <1 Peaked at 2.42 Improving   Bilateral Paranasal Bone Fractures Epistaxis Rhino rocket in place Rhinorocket removed by ENT Plavix  can be resumed in 1 week Follow up with ENT as needed  CAD Hx elevated troponin, sounds like plavix  started with this - would review with cards whether plavix  needed long term (doesn't take aspirin due to intolerance) Low risk stress test 09/2022 Statin on hold with elevated LFT's   Anemia Trend  Hx DVT Not on anticoagulation   Obesity Body mass index is 47.42 kg/m.     Procedures: Echo IMPRESSIONS     1. Left ventricular ejection fraction, by estimation,  is 60 to 65%. The  left ventricle has normal function. The left ventricle has no regional  wall motion abnormalities. Left ventricular diastolic parameters were  normal.   2. Right ventricular systolic function is normal. The right ventricular  size is normal. There is normal pulmonary artery systolic pressure.    3. The mitral valve is normal in structure. No evidence of mitral valve  regurgitation. No evidence of mitral stenosis.   4. The aortic valve is normal in structure. Aortic valve regurgitation is  not visualized. No aortic stenosis is present.   5. The inferior vena cava is normal in size with greater than 50%  respiratory variability, suggesting right atrial pressure of 3 mmHg.   Consultations: ENT PCCM  Discharge Exam: Vitals:   02/28/24 0349 02/28/24 0811  BP: (!) 142/104 (!) 150/93  Pulse: 66 62  Resp: 13 19  Temp: 98.2 F (36.8 C) 98 F (36.7 C)  SpO2: 100% 100%   No new complaints  General: No acute distress. Facial bruising related to fall Cardiovascular RRR Lungs: Clear to auscultation bilaterally Abdomen: Soft, nontender, nondistended . Neurological: Alert and oriented 3. Moves all extremities 4 with equal strength. Cranial nerves II through XII grossly intact. Extremities: trace edema  Discharge Instructions   Discharge Instructions     Call MD for:  difficulty breathing, headache or visual disturbances   Complete by: As directed    Call MD for:  extreme fatigue   Complete by: As directed    Call MD for:  hives   Complete by: As directed    Call MD for:  persistant dizziness or light-headedness   Complete by: As directed    Call MD for:  persistant nausea and vomiting   Complete by: As directed    Call MD for:  redness, tenderness, or signs of infection (pain, swelling, redness, odor or green/yellow discharge around incision site)   Complete by: As directed    Call MD for:  severe uncontrolled pain   Complete by: As directed    Call MD for:  temperature >100.4   Complete by: As directed    Diet - low sodium heart healthy   Complete by: As directed    Discharge instructions   Complete by: As directed    You were seen with altered mental status related to hypercarbia.   We've arranged for [name] home bipap for you.  You should use this at night and when  you're napping during the day.    Please follow up with your pulmonary doctor as an outpatient.   Your x ray showed evidence of some mild fluid so I've started you on lasix .  You'll need to follow up with your PCP within Maria Rothlisberger week for repeat labs (to make sure your kidney function and electrolytes are stable).  You should have Temitope Flammer repeat x ray in Avelino Herren few weeks as an outpatient.   We'll resume your blood pressure medicines gradually.  Resume your coreg .  Hold off on restarting your losartan  until you see your outpatient doctor and get repeat labs and follow up on an outpatient blood pressure.   You were seen by ENT for your nose bleed.  The rhinorocket has been removed.  Hold your plavix  for 7 days.  You can resume after this.  Avoid NSAIDs while taking plavix .  It'd be reasonable to discuss with cardiology whether you need to take plavix  long term.  You had bilateral paranasal bone fractures.  You should follow up with  ENT as an outpatient (they note, if you think your nose looks different, you should follow up, but if not you don't have to).   Your liver enzymes were elevated.  Hold off on resuming your crestor  until you get repeat labs with your outpatient doctor.  You may need follow up with gastroenterology if this doesn't return to normal.   Return for new, recurrent, or worsening symptoms.  Please ask your PCP to request records from this hospitalization so they know what was done and what the next steps will be.   Increase activity slowly   Complete by: As directed       Allergies as of 02/28/2024       Reactions   Bayer Aspirin [aspirin] Nausea And Vomiting   Lortab [hydrocodone-acetaminophen ] Other (See Comments)   Unknown reaction   Sulfa Antibiotics Nausea Only        Medication List     PAUSE taking these medications    clopidogrel  75 MG tablet Wait to take this until: March 06, 2024 Commonly known as: PLAVIX  Take 75 mg by mouth daily.   losartan  25 MG tablet Wait to  take this until your doctor or other care provider tells you to start again. Hold off on resuming this until you follow up with your outpatient doctor to follow up your labs and your blood pressures Commonly known as: COZAAR  Take 1 tablet (25 mg total) by mouth daily.   rosuvastatin  20 MG tablet Wait to take this until your doctor or other care provider tells you to start again. Get repeat liver function tests before you resume this medicine Commonly known as: CRESTOR  Take 1 tablet (20 mg total) by mouth daily.       STOP taking these medications    amoxicillin -clavulanate 875-125 MG tablet Commonly known as: AUGMENTIN    ibuprofen 200 MG tablet Commonly known as: ADVIL       TAKE these medications    carvedilol  12.5 MG tablet Commonly known as: COREG  Take 1 tablet (12.5 mg total) by mouth 2 (two) times daily with Zeola Brys meal.   furosemide  40 MG tablet Commonly known as: LASIX  Take 1 tablet (40 mg total) by mouth daily. Follow up with your PCP within 1 week to repeat labs and follow up your volume status.   Multivitamin Women 50+ Tabs Take 1 tablet by mouth daily.   potassium chloride  SA 20 MEQ tablet Commonly known as: KLOR-CON  M Take 1 tablet (20 mEq total) by mouth daily. Follow with your PCP for repeat labs within 1 week   sertraline  100 MG tablet Commonly known as: ZOLOFT  Take 100 mg by mouth daily.       Allergies  Allergen Reactions   Bayer Aspirin [Aspirin] Nausea And Vomiting   Lortab [Hydrocodone-Acetaminophen ] Other (See Comments)    Unknown reaction   Sulfa Antibiotics Nausea Only    Follow-up Information     Maria Elsie SAUNDERS, MD Follow up.   Specialty: Family Medicine Contact information: [address] [facility] [place] [address] [phone]         Roark Rush, MD Follow up.   Specialty: Otolaryngology Why: if you think your nose looks different than baseline, call for ENT follow up in about 1 week for your paranasal bone  fractures Contact information: [address], [address] [place] [address] [phone]                  The results of significant diagnostics from this  hospitalization (including imaging, microbiology, ancillary and laboratory) are listed below for reference.    Significant Diagnostic Studies: ECHOCARDIOGRAM COMPLETE Result Date: 02/27/2024    ECHOCARDIOGRAM REPORT   Patient Name:   ALAN RILES  Date of Exam: 02/27/2024 Medical Rec #:  [id number]  Height:       64.0 in Accession #:    [id number] Weight:       276.2 lb Date of Birth:  01-26-1956 BSA:          2.244 m Patient Age:    67 years   BP:           153/82 mmHg Patient Gender: F          HR:           87 bpm. Exam Location:  Inpatient Procedure: 2D Echo, Cardiac Doppler and Color Doppler (Both Spectral and Color            Flow Doppler were utilized during procedure). Indications:    other abnormalities of the heart  History:        Patient has prior history of Echocardiogram examinations, most                 recent 09/21/2022. COPD.  Sonographer:    Benard Stallion Referring Phys: [phone] Verla Bryngelson CALDWELL POWELL JR IMPRESSIONS  1. Left ventricular ejection fraction, by estimation, is 60 to 65%. The left ventricle has normal function. The left ventricle has no regional wall motion abnormalities. Left ventricular diastolic parameters were normal.  2. Right ventricular systolic function is normal. The right ventricular size is normal. There is normal pulmonary artery systolic pressure.  3. The mitral valve is normal in structure. No evidence of mitral valve regurgitation. No evidence of mitral stenosis.  4. The aortic valve is normal in structure. Aortic valve regurgitation is not visualized. No aortic stenosis is present.  5. The inferior vena cava is normal in size with greater than 50% respiratory variability, suggesting right atrial pressure of 3 mmHg. FINDINGS  Left Ventricle: Left ventricular ejection fraction, by estimation, is  60 to 65%. The left ventricle has normal function. The left ventricle has no regional wall motion abnormalities. The left ventricular internal cavity size was normal in size. There is  no left ventricular hypertrophy. Left ventricular diastolic parameters were normal. Right Ventricle: The right ventricular size is normal. No increase in right ventricular wall thickness. Right ventricular systolic function is normal. There is normal pulmonary artery systolic pressure. The tricuspid regurgitant velocity is 1.32 m/s, and  with an assumed right atrial pressure of 15 mmHg, the estimated right ventricular systolic pressure is 22.0 mmHg. Left Atrium: Left atrial size was normal in size. Right Atrium: Right atrial size was normal in size. Pericardium: There is no evidence of pericardial effusion. Mitral Valve: The mitral valve is normal in structure. No evidence of mitral valve regurgitation. No evidence of mitral valve stenosis. Tricuspid Valve: The tricuspid valve is normal in structure. Tricuspid valve regurgitation is trivial. No evidence of tricuspid stenosis. Aortic Valve: The aortic valve is normal in structure. Aortic valve regurgitation is not visualized. No aortic stenosis is present. Aortic valve mean gradient measures 7.7 mmHg. Aortic valve peak gradient measures 14.5 mmHg. Aortic valve area, by VTI measures 2.13 cm. Pulmonic Valve: The pulmonic valve was normal in structure. Pulmonic valve regurgitation is mild. No evidence of pulmonic stenosis. Aorta: The aortic root is normal in size and structure. Venous: The inferior vena cava is normal in size with  greater than 50% respiratory variability, suggesting right atrial pressure of 3 mmHg. IAS/Shunts: No atrial level shunt detected by color flow Doppler.  LEFT VENTRICLE PLAX 2D LVIDd:         3.80 cm      Diastology LVIDs:         2.70 cm      LV e' medial:    7.18 cm/s LV PW:         1.10 cm      LV E/e' medial:  9.3 LV IVS:        1.15 cm      LV e' lateral:    7.83 cm/s LVOT diam:     2.10 cm      LV E/e' lateral: 8.5 LV SV:         68 LV SV Index:   30 LVOT Area:     3.46 cm  LV Volumes (MOD) LV vol d, MOD A2C: 107.0 ml LV vol d, MOD A4C: 87.0 ml LV vol s, MOD A2C: 48.2 ml LV vol s, MOD A4C: 42.8 ml LV SV MOD A2C:     58.8 ml LV SV MOD A4C:     87.0 ml LV SV MOD BP:      54.0 ml RIGHT VENTRICLE RV Basal diam:  3.30 cm RV Mid diam:    3.30 cm RV S prime:     12.90 cm/s TAPSE (M-mode): 2.0 cm LEFT ATRIUM             Index        RIGHT ATRIUM           Index LA diam:        2.80 cm 1.25 cm/m   RA Area:     15.80 cm LA Vol (A2C):   81.2 ml 36.18 ml/m  RA Volume:   35.20 ml  15.69 ml/m LA Vol (A4C):   47.4 ml 21.12 ml/m LA Biplane Vol: 63.7 ml 28.38 ml/m  AORTIC VALVE AV Area (Vmax):    2.51 cm AV Area (Vmean):   2.76 cm AV Area (VTI):     2.13 cm AV Vmax:           190.67 cm/s AV Vmean:          123.033 cm/s AV VTI:            0.317 m AV Peak Grad:      14.5 mmHg AV Mean Grad:      7.7 mmHg LVOT Vmax:         138.00 cm/s LVOT Vmean:        98.200 cm/s LVOT VTI:          0.195 m LVOT/AV VTI ratio: 0.61  AORTA Ao Root diam: 3.30 cm Ao Asc diam:  3.65 cm MITRAL VALVE                TRICUSPID VALVE MV Area (PHT): 3.53 cm     TV Peak grad:   4.0 mmHg MV Decel Time: 215 msec     TV Vmax:        1.00 m/s MV E velocity: 66.70 cm/s   TR Peak grad:   7.0 mmHg MV Ceaira Ernster velocity: 110.00 cm/s  TR Vmax:        132.00 cm/s MV E/Elchanan Bob ratio:  0.61  SHUNTS                             Systemic VTI:  0.20 m                             Systemic Diam: 2.10 cm Morene Brownie Electronically signed by Morene Brownie Signature Date/Time: 02/27/2024/2:52:25 PM    Final    DG Abd Portable 1V Result Date: 02/24/2024 EXAM: 1 VIEW XRAY OF THE ABDOMEN 02/24/2024 03:49:00 AM COMPARISON: CT abdomen / pelvis earlier today. CLINICAL HISTORY: [id number] Encounter for orogastric (OG) tube placement [id number]. Og tube FINDINGS: BOWEL: Nonobstructive bowel gas pattern. SOFT TISSUES:  Enteric tube in the distal gastric antrum. BONES: No acute osseous abnormality. IMPRESSION: 1. Enteric tube in the distal gastric antrum. 2. Nonobstructive bowel gas pattern. Electronically signed by: Pinkie Pebbles MD 02/24/2024 03:58 AM EDT RP Workstation: [id number]   Portable Chest x-ray Result Date: 02/24/2024 EXAM: 1 VIEW XRAY OF THE CHEST 02/24/2024 03:50:00 AM COMPARISON: 02/23/2024 CLINICAL HISTORY: Endotracheal tube present. FINDINGS: LUNGS AND PLEURA: Mild pulmonary edema, similar. No definite pleural effusions, noting exclusion of the left costophrenic angle. No pneumothorax. HEART AND MEDIASTINUM: Cardiomegaly. BONES AND SOFT TISSUES: No acute osseous abnormality. LINES AND TUBES: Endotracheal tube in place with tip 1.5 cm above carina. Enteric tube in place coursing below diaphragm with tip collimated off view. IMPRESSION: 1. Cardiomegaly with mild pulmonary edema, similar to prior study. 2. Support apparatus as above. Electronically signed by: Pinkie Pebbles MD 02/24/2024 03:58 AM EDT RP Workstation: [id number]   CT ABDOMEN PELVIS WO CONTRAST Result Date: 02/24/2024 EXAM: CT ABDOMEN AND PELVIS WITHOUT CONTRAST 02/24/2024 01:06:17 AM TECHNIQUE: CT of the abdomen and pelvis was performed without the administration of intravenous contrast. Multiplanar reformatted images are provided for review. Automated exposure control, iterative reconstruction, and/or weight based adjustment of the mA/kV was utilized to reduce the radiation dose to as low as reasonably achievable. COMPARISON: None available. CLINICAL HISTORY: Kidney failure, acute. FINDINGS: LOWER CHEST: Mild bibasilar atelectasis. LIVER: The liver is unremarkable. GALLBLADDER AND BILE DUCTS: Gallbladder is unremarkable. No biliary ductal dilatation. SPLEEN: No acute abnormality. PANCREAS: No acute abnormality. ADRENAL GLANDS: No acute abnormality. KIDNEYS, URETERS AND BLADDER: No stones in the kidneys or ureters. No hydronephrosis. No  perinephric or periureteral stranding. Urinary bladder is unremarkable. GI AND BOWEL: Normal appendix (image 55). Left colonic diverticulosis, without evidence of diverticulitis. Stomach demonstrates no acute abnormality. There is no bowel obstruction. No bowel wall thickening. PERITONEUM AND RETROPERITONEUM: No ascites. No free air. VASCULATURE: Aorta is normal in caliber. LYMPH NODES: No lymphadenopathy. REPRODUCTIVE ORGANS: Mildly heterogeneous uterus, suggesting uterine fibroids. BONES AND SOFT TISSUES: Mild degenerative changes of the visualized thoracolumbar spine. No acute osseous abnormality. No focal soft tissue abnormality. IMPRESSION: 1. No acute findings in the abdomen or pelvis. Electronically signed by: Pinkie Pebbles MD 02/24/2024 01:19 AM EDT RP Workstation: [id number]   US  Abdomen Limited RUQ (LIVER/GB) Result Date: 02/24/2024 CLINICAL DATA:  Abdominal pain. EXAM: ULTRASOUND ABDOMEN LIMITED RIGHT UPPER QUADRANT COMPARISON:  None Available. FINDINGS: Gallbladder: No gallstones are visualized. The gallbladder wall measures 3.1 mm in thickness. No sonographic Murphy sign noted by sonographer. Common bile duct: Diameter: 3.4 mm Liver: The liver is limited in visualization. No focal lesion identified. Diffusely increased echogenicity of the liver parenchyma is noted. Portal vein is patent on color Doppler imaging with normal direction of blood flow towards the  liver. Other: The study is technically limited secondary to the patient's body habitus and limited patient positioning. IMPRESSION: 1. Limited study, as described above. 2. Hepatic steatosis. Electronically Signed   By: Suzen Dials M.D.   On: 02/24/2024 00:59   CT HEAD WO CONTRAST Result Date: 02/23/2024 CLINICAL DATA:  Head trauma, minor (Age >= 65y) hx recent fall, diagnosed with nasal fracture at another facility, now with decreased mental status EXAM: CT HEAD WITHOUT CONTRAST TECHNIQUE: Contiguous axial images were obtained from  the base of the skull through the vertex without intravenous contrast. RADIATION DOSE REDUCTION: This exam was performed according to the departmental dose-optimization program which includes automated exposure control, adjustment of the mA and/or kV according to patient size and/or use of iterative reconstruction technique. COMPARISON:  None Available. FINDINGS: Brain: No evidence of large-territorial acute infarction. No parenchymal hemorrhage. No mass lesion. No extra-axial collection. No mass effect or midline shift. No hydrocephalus. Basilar cisterns are patent. Vascular: No hyperdense vessel. Skull: No acute fracture or focal lesion. Partially visualized acute bilateral paranasal bone fractures. Sinuses/Orbits: Bilateral maxillary and right sphenoid sinus mucosal thickening. Right mastoid air cell partial effusion and fluid within the right middle ear. Debris within the right nasal cavity. Paranasal sinuses and left mastoid air cells are clear. The orbits are unremarkable. Other: None. IMPRESSION: 1. No acute intracranial abnormality. 2. Partially visualized acute bilateral paranasal bone fractures. Consider CT max face for further evaluation. 3. Right mastoid air cell partial effusion and fluid within the right middle ear. Electronically Signed   By: Morgane  Naveau M.D.   On: 02/23/2024 22:21   DG Chest 1 View Result Date: 02/23/2024 CLINICAL DATA:  ALTERED MENTAL STATUS Reason for exam: Lethargic, Disoriented, SHOB EXAM: CHEST  1 VIEW COMPARISON:  Chest x-ray 09/20/2022 FINDINGS: Limited evaluation due to overlapping osseous structures and overlying soft tissues. The heart and mediastinal contours are unchanged. Low lung volumes. No focal consolidation. Mild pulmonary edema. Likely bilateral pleural effusion. No pneumothorax. No acute osseous abnormality. IMPRESSION: 1. Low lung volumes and cardiomegaly. Limited evaluation due to overlapping osseous structures and overlying soft tissues. Mild pulmonary  edema with likely bilateral pleural effusions. 2. Recommend repeat chest x-ray with PA and lateral views for further evaluation. Electronically Signed   By: Morgane  Naveau M.D.   On: 02/23/2024 21:44    Microbiology: Recent Results (from the past 240 hours)  Resp panel by RT-PCR (RSV, Flu Adiah Guereca&B, Covid) Anterior Nasal Swab     Status: None   Collection Time: 02/24/24 12:31 AM   Specimen: Anterior Nasal Swab  Result Value Ref Range Status   SARS Coronavirus 2 by RT PCR NEGATIVE NEGATIVE Final   Influenza Cristela Stalder by PCR NEGATIVE NEGATIVE Final   Influenza B by PCR NEGATIVE NEGATIVE Final    Comment: (NOTE) The Xpert Xpress SARS-CoV-2/FLU/RSV plus assay is intended as an aid in the diagnosis of influenza from Nasopharyngeal swab specimens and should not be used as Raydel Hosick sole basis for treatment. Nasal washings and aspirates are unacceptable for Xpert Xpress SARS-CoV-2/FLU/RSV testing.  Fact Sheet for Patients: BloggerCourse.com  Fact Sheet for Healthcare Providers: SeriousBroker.it  This test is not yet approved or cleared by the [place]  FDA and has been authorized for detection and/or diagnosis of SARS-CoV-2 by FDA under an Emergency Use Authorization (EUA). This EUA will remain in effect (meaning this test can be used) for the duration of the COVID-19 declaration under Section 564(b)(1) of the Act, 21 U.S.C. section 360bbb-3(b)(1), unless the authorization is  terminated or revoked.     Resp Syncytial Virus by PCR NEGATIVE NEGATIVE Final    Comment: (NOTE) Fact Sheet for Patients: BloggerCourse.com  Fact Sheet for Healthcare Providers: SeriousBroker.it  This test is not yet approved or cleared by the [place]  FDA and has been authorized for detection and/or diagnosis of SARS-CoV-2 by FDA under an Emergency Use Authorization (EUA). This EUA will remain in effect (meaning this  test can be used) for the duration of the COVID-19 declaration under Section 564(b)(1) of the Act, 21 U.S.C. section 360bbb-3(b)(1), unless the authorization is terminated or revoked.  Performed at [facility], [address], [place], [place] [address]   MRSA Next Gen by PCR, Nasal     Status: None   Collection Time: 02/24/24 12:39 AM   Specimen: Nasal Mucosa; Nasal Swab  Result Value Ref Range Status   MRSA by PCR Next Gen NOT DETECTED NOT DETECTED Final    Comment: (NOTE) The GeneXpert MRSA Assay (FDA approved for NASAL specimens only), is one component of Nora Sabey comprehensive MRSA colonization surveillance program. It is not intended to diagnose MRSA infection nor to guide or monitor treatment for MRSA infections. Test performance is not FDA approved in patients less than 41 years old. Performed at [facility] [facility], [address], [place], [place] [address]      Labs: Basic Metabolic Panel: Recent Labs  Lab 02/24/24 [phone] 02/24/24 0449 02/25/24 0510 02/25/24 1527 02/26/24 0453 02/27/24 0433 02/28/24 0536  NA 141   < > 138 141 142 142 141  K 5.1   < > 3.9 4.1 4.0 4.1 3.3*  CL 99  --  98 98 100 100 100  CO2 30  --  30 32 34* 31 31  GLUCOSE 128*  --  132* 128* 104* 98 144*  BUN 42*  --  45* 45* 33* 27* 22  CREATININE 2.34*  --  1.89* 1.69* 1.31* 1.14* 1.00  CALCIUM  8.8*  --  8.7* 8.8* 8.9 9.2 8.9  MG 1.9  --  2.0  --  2.1 1.9 1.8  PHOS 4.9*  --  2.5  --  2.8 2.8 3.6   < > = values in this interval not displayed.   Liver Function Tests: Recent Labs  Lab 02/25/24 0510 02/25/24 1527 02/26/24 0453 02/27/24 0433 02/28/24 0536  AST 494* 299* 159* 69* 33  ALT 1,057* 990* 671* 505* 271*  ALKPHOS 54 62 63 64 60  BILITOT 1.0 0.8 1.0 1.1 0.8  PROT 5.3* 5.5* 5.6* 5.9* 5.5*  ALBUMIN 2.7* 2.9* 2.9* 3.1* 2.9*   No results for input(s): LIPASE, AMYLASE in the last 168 hours. Recent Labs  Lab 02/23/24 2352  AMMONIA 43*   CBC: Recent Labs  Lab  02/23/24 2028 02/23/24 2049 02/24/24 0347 02/24/24 0449 02/25/24 0510 02/26/24 0453 02/27/24 0433 02/28/24 0536  WBC 10.4   < > 9.1  --  6.8 5.9 7.7 6.2  NEUTROABS 9.1*  --   --   --  5.3 4.0 4.8 3.8  HGB 11.2*   < > 10.9* 10.9* 9.7* 9.7* 10.5* 9.8*  HCT 35.9*   < > 34.3* 32.0* 30.2* 30.8* 33.5* 30.1*  MCV 92.1   < > 90.0  --  91.2 91.7 91.8 90.1  PLT 183   < > 187  --  166 170 205 188   < > = values in this interval not displayed.   Cardiac Enzymes: Recent Labs  Lab 02/23/24 2352 02/25/24 1527  CKTOTAL 2,042* 381*   BNP: BNP (last 3 results) Recent Labs    02/23/24 2352 02/26/24 0453  BNP 661.8* 76.0    ProBNP (last 3 results) No results for input(s): PROBNP in the last 8760 hours.  CBG: Recent Labs  Lab 02/27/24 1559 02/27/24 2040 02/27/24 2320 02/28/24 0355 02/28/24 0810  GLUCAP 120* 117* 103* 120* 95       Signed:  Meliton Monte MD.  [facility] 02/28/2024, 11:03 AM

## 2024-02-28 NOTE — Progress Notes (Signed)
   02/28/24 0902  Mobility  Activity Ambulated independently in hallway;Ambulated independently to bathroom  Level of Assistance Standby assist, set-up cues, supervision of patient - no hands on  Assistive Device None  Distance Ambulated (ft) 500 ft  Activity Response Tolerated well  Mobility Referral Yes  Mobility visit 1 Mobility  Mobility Specialist Start Time (ACUTE ONLY) 0902  Mobility Specialist Stop Time (ACUTE ONLY) 0912  Mobility Specialist Time Calculation (min) (ACUTE ONLY) 10 min   Mobility Specialist: Progress Note  Pre-Mobility:      HR 87, SpO2 96% 4L During Mobility: HR 114, SpO2 98% 4L Post-Mobility:    HR 97, SpO2 100% 4L Pt agreeable to mobility session - received in bed. Pt was asymptomatic throughout session with no complaints. Returned to bed with all needs met - call bell within reach. Daughter and Son in [place] present.   Virgle Boards, BS Mobility Specialist Please contact via SecureChat or  Rehab office at [phone].

## 2024-02-28 NOTE — Care Management Important Message (Signed)
 Important Message  Patient Details  Name: [name] MRN: [id number] Date of Birth: 1955-09-17   Important Message Given:  Yes - Medicare IM   IM given on 7/24  Claretta Deed 02/28/2024, 8:38 AM

## 2024-02-28 NOTE — TOC Transition Note (Addendum)
 Transition of Care [facility]) - Discharge Note   Patient Details  Name: [name] MRN: [id number] Date of Birth: 22-Nov-1955  [facility] CM/SW Contact:  Corean JAYSON Canary, RN Phone Number: 02/28/2024, 7:55 AM   Clinical Narrative:    Patient transitioning home today, Mitch aware, will update him with time. [employer] will come to home with NIV and educate set up. No other needs identified 1100 Mitch from [employer] aware of discharge order  Final next level of care: Home/Self Care Barriers to Discharge: No Barriers Identified   Patient Goals and CMS Choice            Discharge Placement                       Discharge Plan and Services Additional resources added to the After Visit Summary for                  DME Arranged: NIV DME Agency: [employer] Date DME Agency Contacted: 02/27/24   Representative spoke with at DME Agency: Thomasina            Social Drivers of Health (SDOH) Interventions SDOH Screenings   Tobacco Use: Low Risk  (02/23/2024)     Readmission Risk Interventions     No data to display

## 2024-02-28 NOTE — Plan of Care (Signed)

## 2024-03-03 ENCOUNTER — Institutional Professional Consult (permissible substitution) (INDEPENDENT_AMBULATORY_CARE_PROVIDER_SITE_OTHER): Admitting: Physician Assistant

## 2024-03-03 ENCOUNTER — Telehealth (INDEPENDENT_AMBULATORY_CARE_PROVIDER_SITE_OTHER): Payer: Self-pay | Admitting: Physician Assistant

## 2024-03-03 NOTE — Telephone Encounter (Signed)
 03/03/24 Called patient for discharge appt, patient stated will call back once she has other appts scheduled.

## 2024-03-12 DIAGNOSIS — D649 Anemia, unspecified: Secondary | ICD-10-CM | POA: Diagnosis not present

## 2024-03-12 DIAGNOSIS — F324 Major depressive disorder, single episode, in partial remission: Secondary | ICD-10-CM | POA: Diagnosis not present

## 2024-03-12 DIAGNOSIS — J449 Chronic obstructive pulmonary disease, unspecified: Secondary | ICD-10-CM | POA: Diagnosis not present

## 2024-03-12 DIAGNOSIS — I10 Essential (primary) hypertension: Secondary | ICD-10-CM | POA: Diagnosis not present

## 2024-03-12 DIAGNOSIS — S022XXD Fracture of nasal bones, subsequent encounter for fracture with routine healing: Secondary | ICD-10-CM | POA: Diagnosis not present

## 2024-03-12 DIAGNOSIS — R945 Abnormal results of liver function studies: Secondary | ICD-10-CM | POA: Diagnosis not present

## 2024-04-29 ENCOUNTER — Other Ambulatory Visit (HOSPITAL_COMMUNITY): Payer: Self-pay

## 2024-06-01 DIAGNOSIS — J449 Chronic obstructive pulmonary disease, unspecified: Secondary | ICD-10-CM | POA: Diagnosis not present

## 2024-06-01 DIAGNOSIS — I509 Heart failure, unspecified: Secondary | ICD-10-CM | POA: Diagnosis not present

## 2024-06-01 DIAGNOSIS — F324 Major depressive disorder, single episode, in partial remission: Secondary | ICD-10-CM | POA: Diagnosis not present

## 2024-06-01 DIAGNOSIS — I10 Essential (primary) hypertension: Secondary | ICD-10-CM | POA: Diagnosis not present

## 2024-06-01 DIAGNOSIS — Z23 Encounter for immunization: Secondary | ICD-10-CM | POA: Diagnosis not present

## 2024-06-01 DIAGNOSIS — Z1211 Encounter for screening for malignant neoplasm of colon: Secondary | ICD-10-CM | POA: Diagnosis not present

## 2024-06-01 DIAGNOSIS — D649 Anemia, unspecified: Secondary | ICD-10-CM | POA: Diagnosis not present
